# Patient Record
Sex: Female | Born: 1955
Health system: Southern US, Community
[De-identification: ages and names within clinical notes are randomized; demographics above are authoritative.]

## PROBLEM LIST (undated history)

## (undated) DIAGNOSIS — J302 Other seasonal allergic rhinitis: Secondary | ICD-10-CM

## (undated) DIAGNOSIS — T8859XA Other complications of anesthesia, initial encounter: Secondary | ICD-10-CM

## (undated) DIAGNOSIS — I00 Rheumatic fever without heart involvement: Secondary | ICD-10-CM

## (undated) DIAGNOSIS — K635 Polyp of colon: Secondary | ICD-10-CM

## (undated) DIAGNOSIS — N390 Urinary tract infection, site not specified: Secondary | ICD-10-CM

## (undated) DIAGNOSIS — E785 Hyperlipidemia, unspecified: Secondary | ICD-10-CM

## (undated) DIAGNOSIS — C801 Malignant (primary) neoplasm, unspecified: Secondary | ICD-10-CM

## (undated) HISTORY — DX: Hyperlipidemia, unspecified: E78.5

## (undated) HISTORY — DX: Polyp of colon: K63.5

## (undated) HISTORY — DX: Rheumatic fever without heart involvement: I00

## (undated) HISTORY — DX: Urinary tract infection, site not specified: N39.0

## (undated) HISTORY — DX: Other seasonal allergic rhinitis: J30.2

## (undated) HISTORY — DX: Malignant (primary) neoplasm, unspecified: C80.1

## (undated) HISTORY — PX: SALIVARY GLAND SURGERY: SHX768

---

## 1984-02-22 HISTORY — PX: TUBAL LIGATION: SHX77

## 1998-06-18 ENCOUNTER — Other Ambulatory Visit: Admission: RE | Admit: 1998-06-18 | Discharge: 1998-06-18 | Payer: Self-pay | Admitting: Gynecology

## 1999-07-01 ENCOUNTER — Other Ambulatory Visit: Admission: RE | Admit: 1999-07-01 | Discharge: 1999-07-01 | Payer: Self-pay | Admitting: Gynecology

## 2000-07-06 ENCOUNTER — Other Ambulatory Visit: Admission: RE | Admit: 2000-07-06 | Discharge: 2000-07-06 | Payer: Self-pay | Admitting: Gynecology

## 2002-03-07 ENCOUNTER — Other Ambulatory Visit: Admission: RE | Admit: 2002-03-07 | Discharge: 2002-03-07 | Payer: Self-pay | Admitting: Gynecology

## 2003-07-03 ENCOUNTER — Other Ambulatory Visit: Admission: RE | Admit: 2003-07-03 | Discharge: 2003-07-03 | Payer: Self-pay | Admitting: Gynecology

## 2004-08-13 ENCOUNTER — Other Ambulatory Visit: Admission: RE | Admit: 2004-08-13 | Discharge: 2004-08-13 | Payer: Self-pay | Admitting: Gynecology

## 2006-11-24 ENCOUNTER — Encounter: Admission: RE | Admit: 2006-11-24 | Discharge: 2006-11-24 | Payer: Self-pay | Admitting: Family Medicine

## 2006-11-24 IMAGING — CR DG CHEST 2V
2 series · 2 of 2 positions shown · non-contrast
Comparison: none

CLINICAL DATA: Fever, cough, congestion and left sided chest pain. 
 CHEST - 2 VIEW:
 No prior studies.

[view not recorded (1 of 2)]
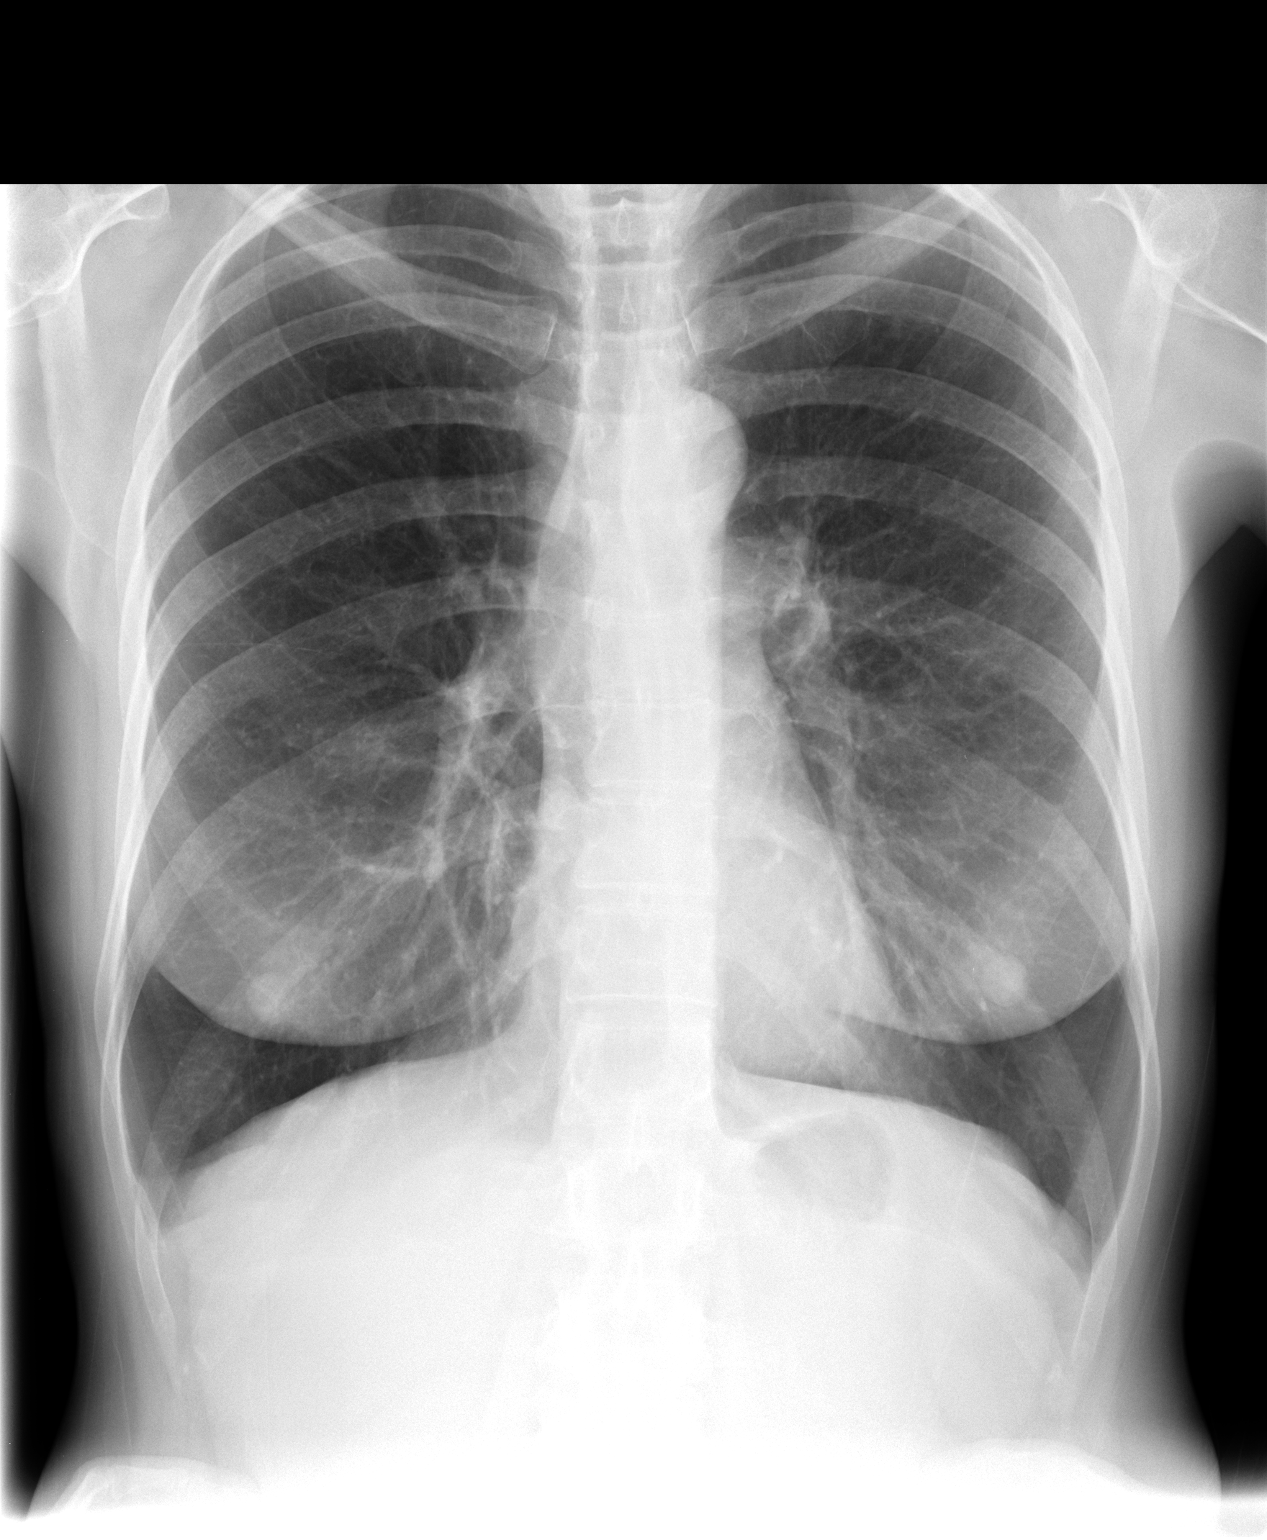

[view not recorded (2 of 2)]
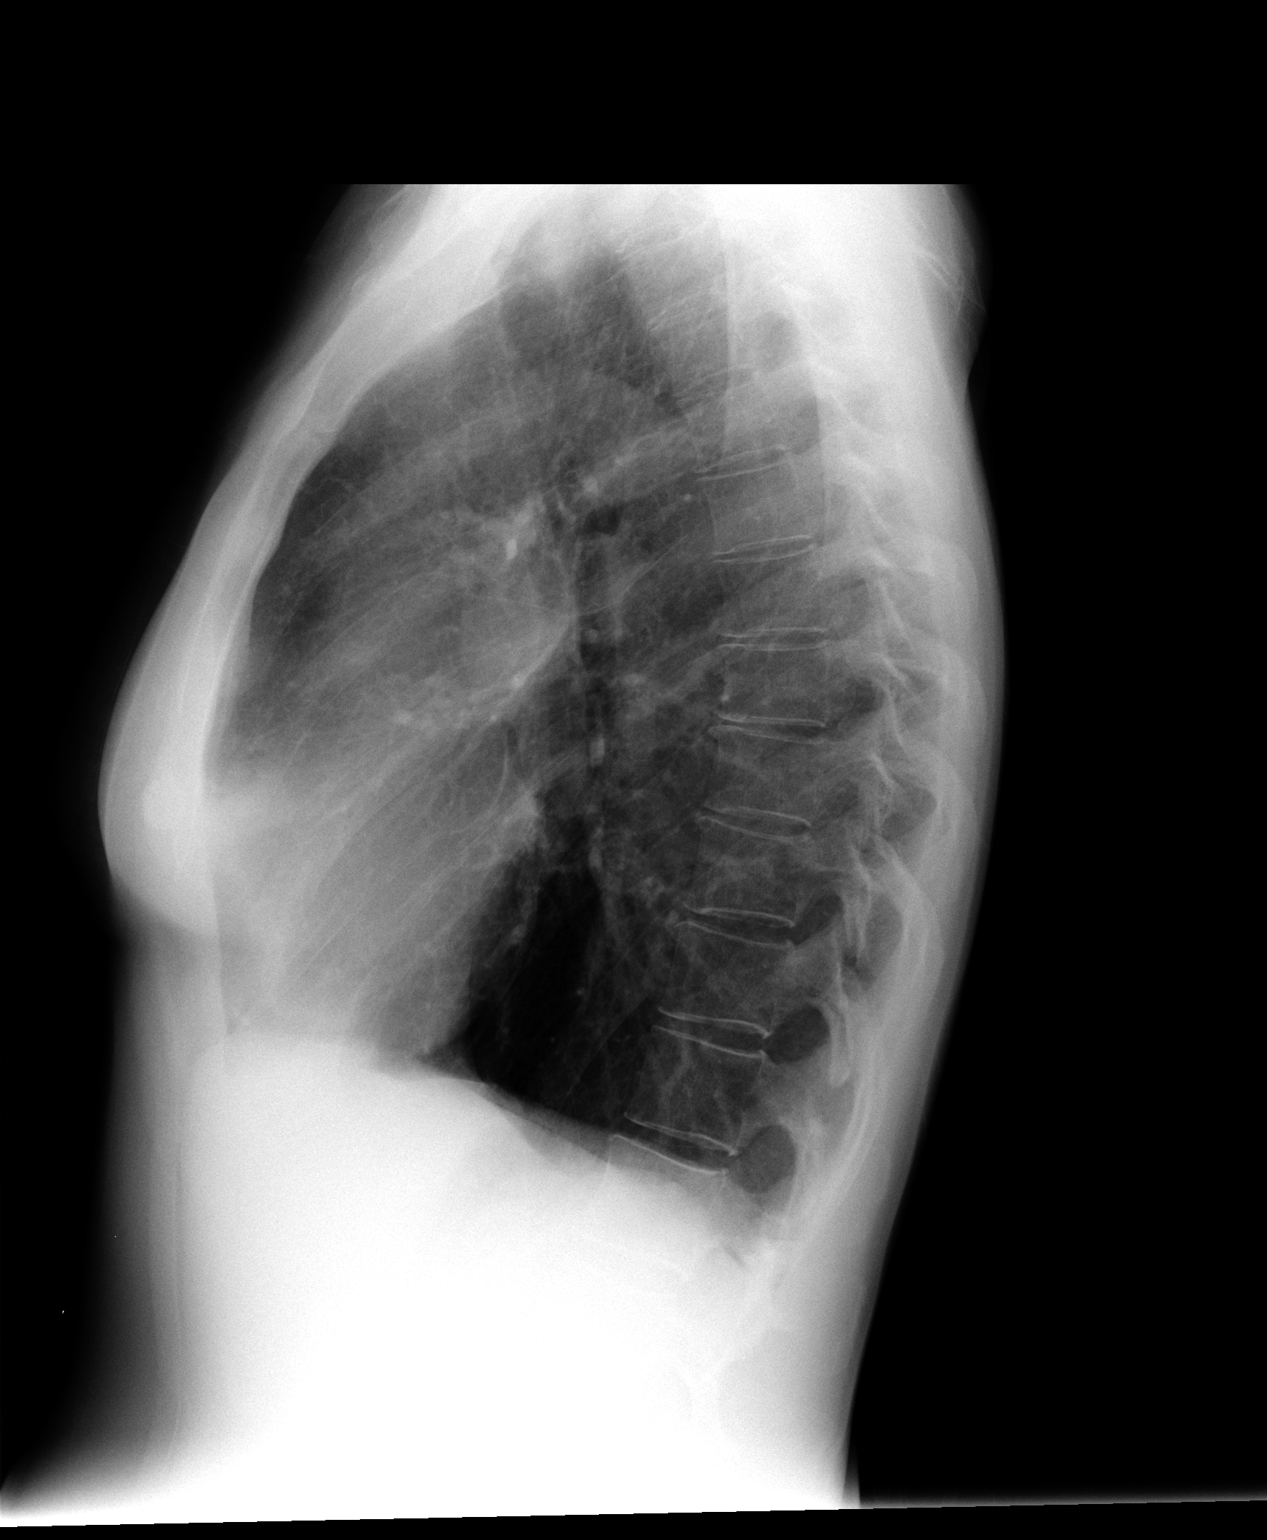

[2 of 2 positions shown; findings below may reference images not displayed]

FINDINGS: No infiltrate, edema or pleural effusion.  Heart size is normal.  Normal bony thorax.
IMPRESSION: No active disease.

## 2009-09-15 ENCOUNTER — Encounter: Admission: RE | Admit: 2009-09-15 | Discharge: 2009-09-15 | Payer: Self-pay | Admitting: Gynecology

## 2010-03-14 ENCOUNTER — Encounter: Payer: Self-pay | Admitting: Gynecology

## 2015-02-19 ENCOUNTER — Telehealth: Payer: Self-pay | Admitting: Acute Care

## 2015-02-19 NOTE — Telephone Encounter (Signed)
Left message for patient x3.  Sent letter to patient.   Dear Mrs. Sue Chase We have attempted to call you several times to schedule the lung screening Dr. London Pepper requested you have performed. We have been unable to contact you by phone. Please call the number below at your earliest convenience so that we can get you scheduled for your screening. We look forward to participating in your care.  Thank you,  [facility] [phone]

## 2016-02-22 HISTORY — PX: COLONOSCOPY: SHX174

## 2017-06-29 ENCOUNTER — Ambulatory Visit (INDEPENDENT_AMBULATORY_CARE_PROVIDER_SITE_OTHER): Payer: BLUE CROSS/BLUE SHIELD | Admitting: Adult Health

## 2017-06-29 ENCOUNTER — Encounter: Payer: Self-pay | Admitting: Adult Health

## 2017-06-29 VITALS — BP 106/70 | Temp 98.0°F | Wt 121.0 lb

## 2017-06-29 DIAGNOSIS — R600 Localized edema: Secondary | ICD-10-CM

## 2017-06-29 DIAGNOSIS — E785 Hyperlipidemia, unspecified: Secondary | ICD-10-CM | POA: Insufficient documentation

## 2017-06-29 DIAGNOSIS — Z720 Tobacco use: Secondary | ICD-10-CM | POA: Diagnosis not present

## 2017-06-29 DIAGNOSIS — H2511 Age-related nuclear cataract, right eye: Secondary | ICD-10-CM | POA: Diagnosis not present

## 2017-06-29 DIAGNOSIS — Z0000 Encounter for general adult medical examination without abnormal findings: Secondary | ICD-10-CM

## 2017-06-29 LAB — CBC WITH DIFFERENTIAL/PLATELET
BASOS ABS: 0 10*3/uL (ref 0.0–0.1)
BASOS PCT: 0.4 % (ref 0.0–3.0)
EOS ABS: 0.4 10*3/uL (ref 0.0–0.7)
Eosinophils Relative: 6.1 % — ABNORMAL HIGH (ref 0.0–5.0)
HEMATOCRIT: 41.9 % (ref 36.0–46.0)
HEMOGLOBIN: 14.3 g/dL (ref 12.0–15.0)
LYMPHS PCT: 46.4 % — AB (ref 12.0–46.0)
Lymphs Abs: 2.9 10*3/uL (ref 0.7–4.0)
MCHC: 34.2 g/dL (ref 30.0–36.0)
MCV: 100 fl (ref 78.0–100.0)
Monocytes Absolute: 0.6 10*3/uL (ref 0.1–1.0)
Monocytes Relative: 9.3 % (ref 3.0–12.0)
Neutro Abs: 2.3 10*3/uL (ref 1.4–7.7)
Neutrophils Relative %: 37.8 % — ABNORMAL LOW (ref 43.0–77.0)
Platelets: 305 10*3/uL (ref 150.0–400.0)
RBC: 4.19 Mil/uL (ref 3.87–5.11)
RDW: 15.1 % (ref 11.5–15.5)
WBC: 6.2 10*3/uL (ref 4.0–10.5)

## 2017-06-29 LAB — POCT URINALYSIS DIPSTICK
BILIRUBIN UA: NEGATIVE
GLUCOSE UA: NEGATIVE
KETONES UA: NEGATIVE
LEUKOCYTES UA: NEGATIVE
Nitrite, UA: NEGATIVE
Odor: NEGATIVE
RBC UA: NEGATIVE
SPEC GRAV UA: 1.025 (ref 1.010–1.025)
Urobilinogen, UA: 0.2 E.U./dL
pH, UA: 6 (ref 5.0–8.0)

## 2017-06-29 LAB — HEMOGLOBIN A1C: Hgb A1c MFr Bld: 5.1 % (ref 4.6–6.5)

## 2017-06-29 LAB — LIPID PANEL
CHOL/HDL RATIO: 4
CHOLESTEROL: 236 mg/dL — AB (ref 0–200)
HDL: 65.1 mg/dL (ref >39.00)
NonHDL: 171.24
Triglycerides: 269 mg/dL — ABNORMAL HIGH (ref 0.0–149.0)
VLDL: 53.8 mg/dL — ABNORMAL HIGH (ref 0.0–40.0)

## 2017-06-29 LAB — COMPREHENSIVE METABOLIC PANEL
ALBUMIN: 4.1 g/dL (ref 3.5–5.2)
ALT: 7 U/L (ref 0–35)
AST: 18 U/L (ref 0–37)
Alkaline Phosphatase: 76 U/L (ref 39–117)
BUN: 6 mg/dL (ref 6–23)
CALCIUM: 9.7 mg/dL (ref 8.4–10.5)
CO2: 33 mEq/L — ABNORMAL HIGH (ref 19–32)
CREATININE: 0.54 mg/dL (ref 0.40–1.20)
Chloride: 102 mEq/L (ref 96–112)
GFR: 121.68 mL/min (ref >60.00)
Glucose, Bld: 69 mg/dL — ABNORMAL LOW (ref 70–99)
Potassium: 3.2 mEq/L — ABNORMAL LOW (ref 3.5–5.1)
SODIUM: 146 meq/L — AB (ref 135–145)
TOTAL PROTEIN: 6.7 g/dL (ref 6.0–8.3)
Total Bilirubin: 0.5 mg/dL (ref 0.2–1.2)

## 2017-06-29 LAB — LDL CHOLESTEROL, DIRECT: Direct LDL: 128 mg/dL

## 2017-06-29 LAB — BRAIN NATRIURETIC PEPTIDE: PRO B NATRI PEPTIDE: 26 pg/mL (ref 0.0–100.0)

## 2017-06-29 LAB — VITAMIN D 25 HYDROXY (VIT D DEFICIENCY, FRACTURES): VITD: 7.56 ng/mL — AB (ref 30.00–100.00)

## 2017-06-29 LAB — TSH: TSH: 2.23 u[IU]/mL (ref 0.35–4.50)

## 2017-06-29 MED ORDER — PRAVASTATIN SODIUM 20 MG PO TABS: 20.0000 mg | ORAL_TABLET | Freq: Every day | ORAL | 3 refills | Status: DC

## 2017-06-29 NOTE — Progress Notes (Signed)
Patient presents to clinic today to establish care. She is a pleasant 62 year old female who  has a past medical history of Colon polyps, Hyperlipidemia, Rheumatic fever, Seasonal allergies, and UTI (urinary tract infection).   She is a former patient of the [employer].  Last physical was December 2017    Acute Concerns: Establish Care /CPE   Chronic Issues: Hyperlipidemia - Takes Pravastatin - needs refill.   Tobacco Use - smokes about a pack a day.  She understands that she needs to quit and has quit in the past, but does not want to quit at this time  Lower extremity edema -has been present for multiple years.  Reports swelling is greater in left than right is worse as the day progresses.  Swelling resolves in the evening when she is lying down.  Denies any pain, redness or warmth.  He does eat a high sodium diet.  He denies any chest pain or shortness of breath with exertion  Health Maintenance: Dental --Routine - Dr. Vivia Ewing  Vision -- Routine  Immunizations --  Unknown.  Colonoscopy -- 2018 - 5 year plan - done by [facility]  Mammogram -- Done at GYN office in the past.  Reports last was probably 10 years ago.  She plans on following up with GYN PAP -- " 10 years ago" -she is going to reestablish with GYN Bone Density -- Had one - unknown date.  Diet: Does not follow a diet. Eats out 3 x a week. Does not eat fast food.  Exercise: Walks multiple times per week   Past Medical History:  Diagnosis Date  . Colon polyps   . Hyperlipidemia   . Rheumatic fever   . Seasonal allergies   . UTI (urinary tract infection)     Past Surgical History:  Procedure Laterality Date  . SALIVARY GLAND SURGERY     LATE 80S  . TUBAL LIGATION  1986    Current Outpatient Medications on File Prior to Visit  Medication Sig Dispense Refill  . loratadine (CLARITIN) 10 MG tablet Take 10 mg by mouth daily.    . pravastatin (PRAVACHOL) 20 MG tablet Take 20 mg by mouth daily.      No current facility-administered medications on file prior to visit.     Allergies  Allergen Reactions  . Codeine Itching    Family History  Problem Relation Age of Onset  . Arthritis Mother   . Diabetes Mother   . Heart disease Mother   . Hyperlipidemia Mother   . Hypertension Mother   . Miscarriages / [place] Mother   . Hearing loss Father   . Liver cancer Father   . Lung cancer Father   . Hyperlipidemia Father   . Diabetes Brother   . Lung disease Maternal Grandfather        Black Lung  . Diabetes Paternal Grandmother   . Cancer Paternal Grandfather   . Diabetes Paternal Grandfather     Social History   Socioeconomic History  . Marital status: Married    Spouse name: Not on file  . Number of children: Not on file  . Years of education: Not on file  . Highest education level: Not on file  Occupational History  . Not on file  Social Needs  . Financial resource strain: Not on file  . Food insecurity:    Worry: Not on file    Inability: Not on file  . Transportation needs:  Medical: Not on file    Non-medical: Not on file  Tobacco Use  . Smoking status: Current Every Day Smoker    Packs/day: 1.00    Years: 40.00    Pack years: 40.00  . Smokeless tobacco: Never Used  Substance and Sexual Activity  . Alcohol use: Yes    Comment: Rum and Coke/Unsure of amount  . Drug use: Never  . Sexual activity: Not on file  Lifestyle  . Physical activity:    Days per week: Not on file    Minutes per session: Not on file  . Stress: Not on file  Relationships  . Social connections:    Talks on phone: Not on file    Gets together: Not on file    Attends religious service: Not on file    Active member of club or organization: Not on file    Attends meetings of clubs or organizations: Not on file    Relationship status: Not on file  . Intimate partner violence:    Fear of current or ex partner: Not on file    Emotionally abused: Not on file    Physically  abused: Not on file    Forced sexual activity: Not on file  Other Topics Concern  . Not on file  Social History Narrative   Married    Two grown children    She enjoys reading, walking    Review of Systems  Constitutional: Negative.   HENT: Negative.   Eyes: Negative.   Respiratory: Negative.   Cardiovascular: Positive for leg swelling.  Gastrointestinal: Negative.   Genitourinary: Negative.   Musculoskeletal: Negative.   Skin: Negative.   Neurological: Negative.   Endo/Heme/Allergies: Negative.   Psychiatric/Behavioral: Negative.   All other systems reviewed and are negative.   BP 106/70 (BP Location: Left Arm)   Temp 98 F (36.7 C) (Oral)   Wt 121 lb (54.9 kg)   Physical Exam  Constitutional: She is oriented to person, place, and time. She appears well-developed and well-nourished. No distress.  HENT:  Head: Normocephalic and atraumatic.  Right Ear: External ear normal.  Left Ear: External ear normal.  Nose: Nose normal.  Mouth/Throat: Oropharynx is clear and moist. No oropharyngeal exudate.  Eyes: Pupils are equal, round, and reactive to light. Conjunctivae and EOM are normal. Right eye exhibits no discharge. Left eye exhibits no discharge. No scleral icterus.  Neck: Normal range of motion. Neck supple. No JVD present. No tracheal deviation present. No thyromegaly present.  Cardiovascular: Normal rate, regular rhythm, normal heart sounds and intact distal pulses. Exam reveals no gallop and no friction rub.  No murmur heard. Pulmonary/Chest: Effort normal and breath sounds normal. No stridor. No respiratory distress. She has no wheezes. She has no rales. She exhibits no tenderness.  Abdominal: Soft. Bowel sounds are normal. She exhibits no distension and no mass. There is no tenderness. There is no rebound and no guarding. No hernia.  Genitourinary:  Genitourinary Comments: Refused breast exam.  She will establish care with GYN  Musculoskeletal: Normal range of  motion. She exhibits edema (1 pitting edema bilateral legs.  Left greater than right). She exhibits no tenderness or deformity.  Lymphadenopathy:    She has no cervical adenopathy.  Neurological: She is alert and oriented to person, place, and time. She displays normal reflexes. No cranial nerve deficit or sensory deficit. She exhibits normal muscle tone. Coordination normal.  Skin: Skin is warm and dry. Capillary refill takes less than 2 seconds. No  rash noted. She is not diaphoretic. No erythema. No pallor.  Psychiatric: She has a normal mood and affect. Her behavior is normal. Judgment and thought content normal.  Nursing note and vitals reviewed.  Assessment/Plan: 1. Routine general medical examination at a health care facility -Needs to quit smoking -DASH diet recommendation -Continue walking  -We will request records from [place] family physicians - CBC with Differential/Platelet - Hemoglobin A1c - Comprehensive metabolic panel - Lipid panel - TSH - Vitamin D, 25-hydroxy - POC Urinalysis Dipstick  2. Lower extremity edema -Likely due to diet.  Due to smoking history and edema greater on left lower extremity than right.  Would like to rule out degree of heart failure.  Will get echocardiogram and consider referral to cardiology if needed.  -Encouraged low-sodium diet and to drink plenty of water throughout the day - ECHOCARDIOGRAM COMPLETE; Future - CBC with Differential/Platelet - Comprehensive metabolic panel - TSH - Brain Natriuretic Peptide  3. Hyperlipidemia, unspecified hyperlipidemia type - Encouraged heart healthy diet - pravastatin (PRAVACHOL) 20 MG tablet; Take 20 mg by mouth daily. - CBC with Differential/Platelet - Hemoglobin A1c - Comprehensive metabolic panel - Lipid panel - TSH  4. Tobacco use - Encouraged to quit smoking   [name], NP

## 2017-06-29 NOTE — Patient Instructions (Addendum)
It was great meeting you today   I will follow up with you regarding your blood work   Someone will contact you to schedule your echocardiogram   Please work on low sodium diet    DASH Eating Plan DASH stands for "Dietary Approaches to Stop Hypertension." The DASH eating plan is a healthy eating plan that has been shown to reduce high blood pressure (hypertension). It may also reduce your risk for type 2 diabetes, heart disease, and stroke. The DASH eating plan may also help with weight loss. What are tips for following this plan? General guidelines  Avoid eating more than 2,300 mg (milligrams) of salt (sodium) a day. If you have hypertension, you may need to reduce your sodium intake to 1,500 mg a day.  Limit alcohol intake to no more than 1 drink a day for nonpregnant women and 2 drinks a day for men. One drink equals 12 oz of beer, 5 oz of wine, or 1 oz of hard liquor.  Work with your health care provider to maintain a healthy body weight or to lose weight. Ask what an ideal weight is for you.  Get at least 30 minutes of exercise that causes your heart to beat faster (aerobic exercise) most days of the week. Activities may include walking, swimming, or biking.  Work with your health care provider or diet and nutrition specialist (dietitian) to adjust your eating plan to your individual calorie needs. Reading food labels  Check food labels for the amount of sodium per serving. Choose foods with less than 5 percent of the Daily Value of sodium. Generally, foods with less than 300 mg of sodium per serving fit into this eating plan.  To find whole grains, look for the word "whole" as the first word in the ingredient list. Shopping  Buy products labeled as "low-sodium" or "no salt added."  Buy fresh foods. Avoid canned foods and premade or frozen meals. Cooking  Avoid adding salt when cooking. Use salt-free seasonings or herbs instead of table salt or sea salt. Check with your  health care provider or pharmacist before using salt substitutes.  Do not fry foods. Cook foods using healthy methods such as baking, boiling, grilling, and broiling instead.  Cook with heart-healthy oils, such as olive, canola, soybean, or sunflower oil. Meal planning   Eat a balanced diet that includes: ? 5 or more servings of fruits and vegetables each day. At each meal, try to fill half of your plate with fruits and vegetables. ? Up to 6-8 servings of whole grains each day. ? Less than 6 oz of lean meat, poultry, or fish each day. A 3-oz serving of meat is about the same size as a deck of cards. One egg equals 1 oz. ? 2 servings of low-fat dairy each day. ? A serving of nuts, seeds, or beans 5 times each week. ? Heart-healthy fats. Healthy fats called Omega-3 fatty acids are found in foods such as flaxseeds and coldwater fish, like sardines, salmon, and mackerel.  Limit how much you eat of the following: ? Canned or prepackaged foods. ? Food that is high in trans fat, such as fried foods. ? Food that is high in saturated fat, such as fatty meat. ? Sweets, desserts, sugary drinks, and other foods with added sugar. ? Full-fat dairy products.  Do not salt foods before eating.  Try to eat at least 2 vegetarian meals each week.  Eat more home-cooked food and less restaurant, buffet, and fast food.  When eating at a restaurant, ask that your food be prepared with less salt or no salt, if possible. What foods are recommended? The items listed may not be a complete list. Talk with your dietitian about what dietary choices are best for you. Grains Whole-grain or whole-wheat bread. Whole-grain or whole-wheat pasta. Brown rice. [place]. Bulgur. Whole-grain and low-sodium cereals. Pita bread. Low-fat, low-sodium crackers. Whole-wheat flour tortillas. Vegetables Fresh or frozen vegetables (raw, steamed, roasted, or grilled). Low-sodium or reduced-sodium tomato and vegetable juice.  Low-sodium or reduced-sodium tomato sauce and tomato paste. Low-sodium or reduced-sodium canned vegetables. Fruits All fresh, dried, or frozen fruit. Canned fruit in natural juice (without added sugar). Meat and other protein foods Skinless chicken or [place]. Ground chicken or [place]. Pork with fat trimmed off. Fish and seafood. Egg whites. Dried beans, peas, or lentils. Unsalted nuts, nut butters, and seeds. Unsalted canned beans. Lean cuts of beef with fat trimmed off. Low-sodium, lean deli meat. Dairy Low-fat (1%) or fat-free (skim) milk. Fat-free, low-fat, or reduced-fat cheeses. Nonfat, low-sodium ricotta or cottage cheese. Low-fat or nonfat yogurt. Low-fat, low-sodium cheese. Fats and oils Soft margarine without trans fats. Vegetable oil. Low-fat, reduced-fat, or light mayonnaise and salad dressings (reduced-sodium). Canola, safflower, olive, soybean, and sunflower oils. Avocado. Seasoning and other foods Herbs. Spices. Seasoning mixes without salt. Unsalted popcorn and pretzels. Fat-free sweets. What foods are not recommended? The items listed may not be a complete list. Talk with your dietitian about what dietary choices are best for you. Grains Baked goods made with fat, such as croissants, muffins, or some breads. Dry pasta or rice meal packs. Vegetables Creamed or fried vegetables. Vegetables in a cheese sauce. Regular canned vegetables (not low-sodium or reduced-sodium). Regular canned tomato sauce and paste (not low-sodium or reduced-sodium). Regular tomato and vegetable juice (not low-sodium or reduced-sodium). Angie Fava. Olives. Fruits Canned fruit in a light or heavy syrup. Fried fruit. Fruit in cream or butter sauce. Meat and other protein foods Fatty cuts of meat. Ribs. Fried meat. Berniece Salines. Sausage. Bologna and other processed lunch meats. Salami. Fatback. Hotdogs. Bratwurst. Salted nuts and seeds. Canned beans with added salt. Canned or smoked fish. Whole eggs or egg yolks. Chicken  or [place] with skin. Dairy Whole or 2% milk, cream, and half-and-half. Whole or full-fat cream cheese. Whole-fat or sweetened yogurt. Full-fat cheese. Nondairy creamers. Whipped toppings. Processed cheese and cheese spreads. Fats and oils Butter. Stick margarine. Lard. Shortening. Ghee. Bacon fat. Tropical oils, such as coconut, palm kernel, or palm oil. Seasoning and other foods Salted popcorn and pretzels. Onion salt, garlic salt, seasoned salt, table salt, and sea salt. Worcestershire sauce. Tartar sauce. Barbecue sauce. Teriyaki sauce. Soy sauce, including reduced-sodium. Steak sauce. Canned and packaged gravies. Fish sauce. Oyster sauce. Cocktail sauce. Horseradish that you find on the shelf. Ketchup. Mustard. Meat flavorings and tenderizers. Bouillon cubes. Hot sauce and Tabasco sauce. Premade or packaged marinades. Premade or packaged taco seasonings. Relishes. Regular salad dressings. Where to find more information:  National Heart, Lung, and [place]: https://wilson-eaton.com/  American Heart Association: www.heart.org Summary  The DASH eating plan is a healthy eating plan that has been shown to reduce high blood pressure (hypertension). It may also reduce your risk for type 2 diabetes, heart disease, and stroke.  With the DASH eating plan, you should limit salt (sodium) intake to 2,300 mg a day. If you have hypertension, you may need to reduce your sodium intake to 1,500 mg a day.  When on the DASH eating plan,  aim to eat more fresh fruits and vegetables, whole grains, lean proteins, low-fat dairy, and heart-healthy fats.  Work with your health care provider or diet and nutrition specialist (dietitian) to adjust your eating plan to your individual calorie needs. This information is not intended to replace advice given to you by your health care provider. Make sure you discuss any questions you have with your health care provider. Document Released: 01/27/2011 Document Revised:  02/01/2016 Document Reviewed: 02/01/2016 Elsevier Interactive Patient Education  [employer].

## 2017-06-30 ENCOUNTER — Other Ambulatory Visit: Payer: Self-pay | Admitting: Family Medicine

## 2017-06-30 MED ORDER — VITAMIN D (ERGOCALCIFEROL) 1.25 MG (50000 UNIT) PO CAPS: 50000.0000 [IU] | ORAL_CAPSULE | ORAL | 0 refills | Status: DC

## 2017-06-30 NOTE — Telephone Encounter (Signed)
Sent to the pharmacy by e-scribe. 

## 2017-07-10 DIAGNOSIS — Z01818 Encounter for other preprocedural examination: Secondary | ICD-10-CM | POA: Diagnosis not present

## 2017-07-10 DIAGNOSIS — H25811 Combined forms of age-related cataract, right eye: Secondary | ICD-10-CM | POA: Diagnosis not present

## 2017-07-11 DIAGNOSIS — H2511 Age-related nuclear cataract, right eye: Secondary | ICD-10-CM | POA: Diagnosis not present

## 2017-07-11 DIAGNOSIS — H25811 Combined forms of age-related cataract, right eye: Secondary | ICD-10-CM | POA: Diagnosis not present

## 2017-07-12 ENCOUNTER — Encounter: Payer: Self-pay | Admitting: Cardiology

## 2017-07-21 DIAGNOSIS — H25811 Combined forms of age-related cataract, right eye: Secondary | ICD-10-CM | POA: Diagnosis not present

## 2017-07-21 DIAGNOSIS — H25812 Combined forms of age-related cataract, left eye: Secondary | ICD-10-CM | POA: Diagnosis not present

## 2017-07-21 DIAGNOSIS — H2512 Age-related nuclear cataract, left eye: Secondary | ICD-10-CM | POA: Diagnosis not present

## 2017-07-26 ENCOUNTER — Ambulatory Visit (HOSPITAL_COMMUNITY): Payer: BLUE CROSS/BLUE SHIELD | Attending: Cardiovascular Disease

## 2017-07-26 ENCOUNTER — Other Ambulatory Visit: Payer: Self-pay

## 2017-07-26 DIAGNOSIS — E785 Hyperlipidemia, unspecified: Secondary | ICD-10-CM | POA: Insufficient documentation

## 2017-07-26 DIAGNOSIS — I071 Rheumatic tricuspid insufficiency: Secondary | ICD-10-CM | POA: Diagnosis not present

## 2017-07-26 DIAGNOSIS — R600 Localized edema: Secondary | ICD-10-CM | POA: Insufficient documentation

## 2017-07-26 DIAGNOSIS — Z720 Tobacco use: Secondary | ICD-10-CM | POA: Insufficient documentation

## 2017-07-27 ENCOUNTER — Encounter: Payer: Self-pay | Admitting: Family Medicine

## 2017-09-22 ENCOUNTER — Other Ambulatory Visit: Payer: BLUE CROSS/BLUE SHIELD

## 2017-11-20 ENCOUNTER — Telehealth: Payer: Self-pay | Admitting: Adult Health

## 2017-11-20 ENCOUNTER — Encounter: Payer: Self-pay | Admitting: Adult Health

## 2017-11-20 ENCOUNTER — Ambulatory Visit (INDEPENDENT_AMBULATORY_CARE_PROVIDER_SITE_OTHER): Payer: BLUE CROSS/BLUE SHIELD | Admitting: Adult Health

## 2017-11-20 VITALS — BP 110/80 | HR 82 | Temp 97.7°F | Wt 116.0 lb

## 2017-11-20 DIAGNOSIS — R197 Diarrhea, unspecified: Secondary | ICD-10-CM

## 2017-11-20 DIAGNOSIS — R1084 Generalized abdominal pain: Secondary | ICD-10-CM | POA: Diagnosis not present

## 2017-11-20 LAB — BASIC METABOLIC PANEL
BUN: 5 mg/dL — ABNORMAL LOW (ref 6–23)
CO2: 32 mEq/L (ref 19–32)
CREATININE: 0.6 mg/dL (ref 0.40–1.20)
Calcium: 9.6 mg/dL (ref 8.4–10.5)
Chloride: 97 mEq/L (ref 96–112)
GFR: 107.61 mL/min (ref >60.00)
Glucose, Bld: 99 mg/dL (ref 70–99)
POTASSIUM: 3.4 meq/L — AB (ref 3.5–5.1)
Sodium: 139 mEq/L (ref 135–145)

## 2017-11-20 LAB — CBC WITH DIFFERENTIAL/PLATELET
Basophils Absolute: 0 10*3/uL (ref 0.0–0.1)
Basophils Relative: 0.5 % (ref 0.0–3.0)
EOS ABS: 0.3 10*3/uL (ref 0.0–0.7)
Eosinophils Relative: 3.9 % (ref 0.0–5.0)
HCT: 41.9 % (ref 36.0–46.0)
Hemoglobin: 14.6 g/dL (ref 12.0–15.0)
LYMPHS ABS: 2.3 10*3/uL (ref 0.7–4.0)
Lymphocytes Relative: 29.2 % (ref 12.0–46.0)
MCHC: 34.8 g/dL (ref 30.0–36.0)
MCV: 99.4 fl (ref 78.0–100.0)
Monocytes Absolute: 0.9 10*3/uL (ref 0.1–1.0)
Monocytes Relative: 12.2 % — ABNORMAL HIGH (ref 3.0–12.0)
NEUTROS ABS: 4.2 10*3/uL (ref 1.4–7.7)
NEUTROS PCT: 54.2 % (ref 43.0–77.0)
PLATELETS: 345 10*3/uL (ref 150.0–400.0)
RBC: 4.21 Mil/uL (ref 3.87–5.11)
RDW: 15.4 % (ref 11.5–15.5)
WBC: 7.8 10*3/uL (ref 4.0–10.5)

## 2017-11-20 MED ORDER — OMEPRAZOLE 20 MG PO CPDR: 20.0000 mg | DELAYED_RELEASE_CAPSULE | Freq: Every day | ORAL | 3 refills | Status: DC

## 2017-11-20 NOTE — Progress Notes (Signed)
Subjective:    Patient ID: Sue Chase, female    DOB: [birth year], 62 y.o.   MRN: [id number]  HPI 62 year old female who  has a past medical history of Colon polyps, Hyperlipidemia, Rheumatic fever, Seasonal allergies, and UTI (urinary tract infection).  She presents to the office today for an acute issue of abdominal pain and diarrhea. She reports that over the last month she has had chronic diarrhea, denies any blood in her stool, pain with bowel movements, nausea or vomiting. Does not matter if she eats any food. Also with generalized abdominal pain that is intermittent. Described the pain has a burning pain for the first week or so and then just " pain". Has not noticed increased pain with eating.   Has not used anything over the counter to help relieve her symptoms.   She has not had any questionable foods, travel outside of the country, fevers, or feeling acutely ill.   Review of Systems See HPI   Past Medical History:  Diagnosis Date  . Colon polyps   . Hyperlipidemia   . Rheumatic fever   . Seasonal allergies   . UTI (urinary tract infection)     Social History   Socioeconomic History  . Marital status: Married    Spouse name: Not on file  . Number of children: Not on file  . Years of education: Not on file  . Highest education level: Not on file  Occupational History  . Not on file  Social Needs  . Financial resource strain: Not on file  . Food insecurity:    Worry: Not on file    Inability: Not on file  . Transportation needs:    Medical: Not on file    Non-medical: Not on file  Tobacco Use  . Smoking status: Current Every Day Smoker    Packs/day: 1.00    Years: 40.00    Pack years: 40.00  . Smokeless tobacco: Never Used  Substance and Sexual Activity  . Alcohol use: Yes    Comment: Rum and Coke/Unsure of amount  . Drug use: Never  . Sexual activity: Not on file  Lifestyle  . Physical activity:    Days per week: Not on file    Minutes per  session: Not on file  . Stress: Not on file  Relationships  . Social connections:    Talks on phone: Not on file    Gets together: Not on file    Attends religious service: Not on file    Active member of club or organization: Not on file    Attends meetings of clubs or organizations: Not on file    Relationship status: Not on file  . Intimate partner violence:    Fear of current or ex partner: Not on file    Emotionally abused: Not on file    Physically abused: Not on file    Forced sexual activity: Not on file  Other Topics Concern  . Not on file  Social History Narrative   Married    Two grown children    She enjoys reading, walking    Past Surgical History:  Procedure Laterality Date  . SALIVARY GLAND SURGERY     LATE 80S  . TUBAL LIGATION  1986    Family History  Problem Relation Age of Onset  . Arthritis Mother   . Diabetes Mother   . Heart disease Mother   . Hyperlipidemia Mother   . Hypertension Mother   .  Miscarriages / [place] Mother   . Hearing loss Father   . Liver cancer Father   . Lung cancer Father   . Hyperlipidemia Father   . Diabetes Brother   . Lung disease Maternal Grandfather        Black Lung  . Diabetes Paternal Grandmother   . Cancer Paternal Grandfather   . Diabetes Paternal Grandfather     Allergies  Allergen Reactions  . Codeine Itching    Current Outpatient Medications on File Prior to Visit  Medication Sig Dispense Refill  . loratadine (CLARITIN) 10 MG tablet Take 10 mg by mouth daily.    . pravastatin (PRAVACHOL) 20 MG tablet Take 1 tablet (20 mg total) by mouth daily. 90 tablet 3   No current facility-administered medications on file prior to visit.     BP 110/80 (BP Location: Left Arm, Patient Position: Sitting, Cuff Size: Normal)   Pulse 82   Temp 97.7 F (36.5 C) (Oral)   Wt 116 lb (52.6 kg)   SpO2 98%       Objective:   Physical Exam  Constitutional: She is oriented to person, place, and time. She appears  well-developed and well-nourished. No distress.  Cardiovascular: Normal rate, regular rhythm, normal heart sounds and intact distal pulses.  Pulmonary/Chest: Effort normal and breath sounds normal.  Abdominal: Soft. Bowel sounds are normal. She exhibits no distension and no mass. There is no tenderness. There is no rebound and no guarding. No hernia.  Neurological: She is alert and oriented to person, place, and time.  Skin: Skin is warm and dry. She is not diaphoretic.  Psychiatric: She has a normal mood and affect. Her behavior is normal. Judgment and thought content normal.  Nursing note and vitals reviewed.     Assessment & Plan:  1. Diarrhea, unspecified type - Possibly related to GERD or PUD. Will check basic labs and get stool culture due to time frame.  - Start on Prilosec.  - Can take 1/2 tab imodium until form bowel movement - then stop  - Stay hydrated and eat a bland diet  - CBC with Differential/Platelet - Basic Metabolic Panel - Stool culture - Follow up if not resolving in the next week  - Avoid NSAIDS  2. Generalized abdominal pain  - CBC with Differential/Platelet - Basic Metabolic Panel - Stool culture - omeprazole (PRILOSEC) 20 MG capsule; Take 1 capsule (20 mg total) by mouth daily.  Dispense: 30 capsule; Refill: 3  [name], NP

## 2017-11-20 NOTE — Telephone Encounter (Signed)
Copied from [place] [id number]. Topic: Quick Communication - See Telephone Encounter >> Nov 20, 2017  4:38 PM Vernona Rieger wrote: CRM for notification. See Telephone encounter for: 11/20/17.  Patient states her insurance does not cover omeprazole (PRILOSEC) 20 MG capsule and would like to know is there a generic or does the office have samples? What about over the counter stuff, she said?

## 2017-11-20 NOTE — Patient Instructions (Signed)
I am going to prescribe Prilosec to take daily for one month   Take 1/2 tab imodium twice a day until you have a formed stool and then stop   Follow up if not resolving in the next week

## 2017-11-21 ENCOUNTER — Other Ambulatory Visit: Payer: Self-pay | Admitting: Adult Health

## 2017-11-21 NOTE — Telephone Encounter (Signed)
Pt called back. °

## 2017-11-21 NOTE — Telephone Encounter (Signed)
Left a message for a return call.

## 2017-11-21 NOTE — Telephone Encounter (Signed)
Have her buy a month of OTC generic omeprazole 20 mg it is the same medication

## 2017-11-21 NOTE — Telephone Encounter (Signed)
Discussed notes per [name] with Pt. Pt expressed understanding.

## 2017-11-24 LAB — STOOL CULTURE
MICRO NUMBER: 91171281
MICRO NUMBER: 91171282
MICRO NUMBER:: 91171283
SHIGA RESULT:: NOT DETECTED
SPECIMEN QUALITY: ADEQUATE
SPECIMEN QUALITY: ADEQUATE
SPECIMEN QUALITY:: ADEQUATE

## 2018-06-22 ENCOUNTER — Telehealth: Payer: Self-pay | Admitting: Adult Health

## 2018-06-22 NOTE — Telephone Encounter (Signed)
Error

## 2018-07-03 ENCOUNTER — Encounter: Payer: BLUE CROSS/BLUE SHIELD | Admitting: Adult Health

## 2018-07-20 ENCOUNTER — Other Ambulatory Visit: Payer: Self-pay | Admitting: Adult Health

## 2018-07-20 DIAGNOSIS — E785 Hyperlipidemia, unspecified: Secondary | ICD-10-CM

## 2018-07-20 NOTE — Telephone Encounter (Signed)
Sent to the pharmacy by e-scribe. 

## 2018-09-25 ENCOUNTER — Encounter: Payer: BLUE CROSS/BLUE SHIELD | Admitting: Adult Health

## 2018-09-25 DIAGNOSIS — Z0289 Encounter for other administrative examinations: Secondary | ICD-10-CM

## 2018-09-25 NOTE — Progress Notes (Deleted)
Subjective:    Patient ID: Sue Chase, female    DOB: 21-Sep-1955, 63 y.o.   MRN: [id number]  HPI Patient presents for yearly preventative medicine examination. She is a pleasant 63 year old who  has a past medical history of Colon polyps, Hyperlipidemia, Rheumatic fever, Seasonal allergies, and UTI (urinary tract infection).  Hyperlipidemia - takes pravastatin daily. Denies myalgias or fatigue  Lab Results  Component Value Date   CHOL 236 (H) 06/29/2017   HDL 65.10 06/29/2017   LDLDIRECT 128.0 06/29/2017   TRIG 269.0 (H) 06/29/2017   CHOLHDL 4 06/29/2017   Tobacco Use - Continues to smoke about a pack per day. She does not want to quit at this time   All immunizations and health maintenance protocols were reviewed with the patient and needed orders were placed.  Appropriate screening laboratory values were ordered for the patient including screening of hyperlipidemia, renal function and hepatic function. If indicated by BPH, a PSA was ordered.  Medication reconciliation,  past medical history, social history, problem list and allergies were reviewed in detail with the patient  Goals were established with regard to weight loss, exercise, and  diet in compliance with medications  End of life planning was discussed.  She is up to date on routine dental and vision screens. She is next due for a colonoscopy in 2023 ( at [place]).    Review of Systems  Constitutional: Negative.   HENT: Negative.   Eyes: Negative.   Respiratory: Negative.   Cardiovascular: Negative.   Gastrointestinal: Negative.   Endocrine: Negative.   Genitourinary: Negative.   Musculoskeletal: Negative.   Skin: Negative.   Allergic/Immunologic: Negative.   Neurological: Negative.   Hematological: Negative.   Psychiatric/Behavioral: Negative.    Past Medical History:  Diagnosis Date  . Colon polyps   . Hyperlipidemia   . Rheumatic fever   . Seasonal allergies   . UTI (urinary tract infection)      Social History   Socioeconomic History  . Marital status: Married    Spouse name: Not on file  . Number of children: Not on file  . Years of education: Not on file  . Highest education level: Not on file  Occupational History  . Not on file  Social Needs  . Financial resource strain: Not on file  . Food insecurity    Worry: Not on file    Inability: Not on file  . Transportation needs    Medical: Not on file    Non-medical: Not on file  Tobacco Use  . Smoking status: Current Every Day Smoker    Packs/day: 1.00    Years: 40.00    Pack years: 40.00  . Smokeless tobacco: Never Used  Substance and Sexual Activity  . Alcohol use: Yes    Comment: Rum and Coke/Unsure of amount  . Drug use: Never  . Sexual activity: Not on file  Lifestyle  . Physical activity    Days per week: Not on file    Minutes per session: Not on file  . Stress: Not on file  Relationships  . Social Herbalist on phone: Not on file    Gets together: Not on file    Attends religious service: Not on file    Active member of club or organization: Not on file    Attends meetings of clubs or organizations: Not on file    Relationship status: Not on file  . Intimate partner violence  Fear of current or ex partner: Not on file    Emotionally abused: Not on file    Physically abused: Not on file    Forced sexual activity: Not on file  Other Topics Concern  . Not on file  Social History Narrative   Married    Two grown children    She enjoys reading, walking    Past Surgical History:  Procedure Laterality Date  . SALIVARY GLAND SURGERY     LATE 80S  . TUBAL LIGATION  1986    Family History  Problem Relation Age of Onset  . Arthritis Mother   . Diabetes Mother   . Heart disease Mother   . Hyperlipidemia Mother   . Hypertension Mother   . Miscarriages / [place] Mother   . Hearing loss Father   . Liver cancer Father   . Lung cancer Father   . Hyperlipidemia Father   .  Diabetes Brother   . Lung disease Maternal Grandfather        Black Lung  . Diabetes Paternal Grandmother   . Cancer Paternal Grandfather   . Diabetes Paternal Grandfather     Allergies  Allergen Reactions  . Codeine Itching    Current Outpatient Medications on File Prior to Visit  Medication Sig Dispense Refill  . loratadine (CLARITIN) 10 MG tablet Take 10 mg by mouth daily.    Marland Kitchen omeprazole (PRILOSEC) 20 MG capsule Take 1 capsule (20 mg total) by mouth daily. 30 capsule 3  . pravastatin (PRAVACHOL) 20 MG tablet TAKE ONE TABLET BY MOUTH DAILY 90 tablet 0   No current facility-administered medications on file prior to visit.     There were no vitals taken for this visit.      Objective:   Physical Exam Vitals signs and nursing note reviewed.  Constitutional:      General: She is not in acute distress.    Appearance: Normal appearance. She is not diaphoretic.  HENT:     Head: Normocephalic and atraumatic.     Right Ear: Tympanic membrane, ear canal and external ear normal. There is no impacted cerumen.     Left Ear: Tympanic membrane, ear canal and external ear normal. There is no impacted cerumen.     Nose: Nose normal.     Mouth/Throat:     Mouth: Mucous membranes are moist.     Pharynx: Oropharynx is clear. No oropharyngeal exudate or posterior oropharyngeal erythema.  Eyes:     General: No scleral icterus.       Right eye: No discharge.        Left eye: No discharge.     Conjunctiva/sclera: Conjunctivae normal.     Pupils: Pupils are equal, round, and reactive to light.  Neck:     Musculoskeletal: Normal range of motion and neck supple.     Thyroid: No thyromegaly.     Vascular: No JVD.     Trachea: No tracheal deviation.  Cardiovascular:     Rate and Rhythm: Normal rate and regular rhythm.     Pulses: Normal pulses.     Heart sounds: Normal heart sounds. No murmur. No friction rub. No gallop.   Pulmonary:     Effort: Pulmonary effort is normal. No  respiratory distress.     Breath sounds: Normal breath sounds. No stridor. No wheezing, rhonchi or rales.  Chest:     Chest wall: No tenderness.  Abdominal:     General: Bowel sounds are normal. There is no  distension.     Palpations: Abdomen is soft. There is no mass.     Tenderness: There is no abdominal tenderness. There is no right CVA tenderness, left CVA tenderness, guarding or rebound.     Hernia: No hernia is present.  Musculoskeletal: Normal range of motion.        General: No swelling, tenderness or deformity.     Right lower leg: Edema present.     Left lower leg: Edema present.  Lymphadenopathy:     Cervical: No cervical adenopathy.  Skin:    General: Skin is warm and dry.     Capillary Refill: Capillary refill takes less than 2 seconds.     Coloration: Skin is not jaundiced or pale.     Findings: No bruising, erythema, lesion or rash.  Neurological:     General: No focal deficit present.     Mental Status: She is alert and oriented to person, place, and time.     Cranial Nerves: No cranial nerve deficit.     Sensory: No sensory deficit.     Motor: No weakness or abnormal muscle tone.     Coordination: Coordination normal.     Gait: Gait normal.     Deep Tendon Reflexes: Reflexes normal.  Psychiatric:        Mood and Affect: Mood normal.        Behavior: Behavior normal.        Thought Content: Thought content normal.        Judgment: Judgment normal.       Assessment & Plan:

## 2018-10-26 ENCOUNTER — Other Ambulatory Visit: Payer: Self-pay | Admitting: Adult Health

## 2018-10-26 DIAGNOSIS — E785 Hyperlipidemia, unspecified: Secondary | ICD-10-CM

## 2018-10-30 ENCOUNTER — Encounter: Payer: Self-pay | Admitting: Family Medicine

## 2018-10-30 NOTE — Telephone Encounter (Signed)
Filled for 30 days.  Letter sent by mail.

## 2018-11-26 ENCOUNTER — Other Ambulatory Visit: Payer: Self-pay | Admitting: Adult Health

## 2018-11-26 DIAGNOSIS — E785 Hyperlipidemia, unspecified: Secondary | ICD-10-CM

## 2018-11-26 NOTE — Telephone Encounter (Signed)
Pt called checking status on refill

## 2018-11-27 NOTE — Telephone Encounter (Signed)
Sent to the pharmacy by e-scribe for 90 days.  Pt has upcoming cpx on 01/04/2019.

## 2019-01-04 ENCOUNTER — Encounter: Payer: Self-pay | Admitting: Adult Health

## 2019-01-04 ENCOUNTER — Ambulatory Visit (INDEPENDENT_AMBULATORY_CARE_PROVIDER_SITE_OTHER): Payer: BC Managed Care – PPO

## 2019-01-04 ENCOUNTER — Other Ambulatory Visit: Payer: Self-pay | Admitting: Adult Health

## 2019-01-04 ENCOUNTER — Ambulatory Visit (INDEPENDENT_AMBULATORY_CARE_PROVIDER_SITE_OTHER): Payer: BC Managed Care – PPO | Admitting: Adult Health

## 2019-01-04 ENCOUNTER — Other Ambulatory Visit: Payer: Self-pay

## 2019-01-04 VITALS — BP 110/70 | Temp 97.2°F | Ht 62.25 in | Wt 111.0 lb

## 2019-01-04 DIAGNOSIS — Z23 Encounter for immunization: Secondary | ICD-10-CM

## 2019-01-04 DIAGNOSIS — E2839 Other primary ovarian failure: Secondary | ICD-10-CM | POA: Diagnosis not present

## 2019-01-04 DIAGNOSIS — J449 Chronic obstructive pulmonary disease, unspecified: Secondary | ICD-10-CM | POA: Diagnosis not present

## 2019-01-04 DIAGNOSIS — Z0000 Encounter for general adult medical examination without abnormal findings: Secondary | ICD-10-CM | POA: Diagnosis not present

## 2019-01-04 DIAGNOSIS — Z720 Tobacco use: Secondary | ICD-10-CM

## 2019-01-04 DIAGNOSIS — E785 Hyperlipidemia, unspecified: Secondary | ICD-10-CM

## 2019-01-04 DIAGNOSIS — Z1231 Encounter for screening mammogram for malignant neoplasm of breast: Secondary | ICD-10-CM

## 2019-01-04 LAB — CBC WITH DIFFERENTIAL/PLATELET
Basophils Absolute: 0 10*3/uL (ref 0.0–0.1)
Basophils Relative: 0.5 % (ref 0.0–3.0)
Eosinophils Absolute: 0.3 10*3/uL (ref 0.0–0.7)
Eosinophils Relative: 4.7 % (ref 0.0–5.0)
HCT: 39.4 % (ref 36.0–46.0)
Hemoglobin: 13.5 g/dL (ref 12.0–15.0)
Lymphocytes Relative: 46.9 % — ABNORMAL HIGH (ref 12.0–46.0)
Lymphs Abs: 2.9 10*3/uL (ref 0.7–4.0)
MCHC: 34.2 g/dL (ref 30.0–36.0)
MCV: 107.4 fl — ABNORMAL HIGH (ref 78.0–100.0)
Monocytes Absolute: 0.7 10*3/uL (ref 0.1–1.0)
Monocytes Relative: 10.8 % (ref 3.0–12.0)
Neutro Abs: 2.3 10*3/uL (ref 1.4–7.7)
Neutrophils Relative %: 37.1 % — ABNORMAL LOW (ref 43.0–77.0)
Platelets: 308 10*3/uL (ref 150.0–400.0)
RBC: 3.67 Mil/uL — ABNORMAL LOW (ref 3.87–5.11)
RDW: 14.9 % (ref 11.5–15.5)
WBC: 6.1 10*3/uL (ref 4.0–10.5)

## 2019-01-04 LAB — LIPID PANEL
Cholesterol: 206 mg/dL — ABNORMAL HIGH (ref 0–200)
HDL: 82 mg/dL (ref >39.00)
LDL Cholesterol: 99 mg/dL (ref 0–99)
NonHDL: 123.83
Total CHOL/HDL Ratio: 3
Triglycerides: 125 mg/dL (ref 0.0–149.0)
VLDL: 25 mg/dL (ref 0.0–40.0)

## 2019-01-04 LAB — COMPREHENSIVE METABOLIC PANEL
ALT: 7 U/L (ref 0–35)
AST: 17 U/L (ref 0–37)
Albumin: 4.3 g/dL (ref 3.5–5.2)
Alkaline Phosphatase: 72 U/L (ref 39–117)
BUN: 6 mg/dL (ref 6–23)
CO2: 30 mEq/L (ref 19–32)
Calcium: 9.4 mg/dL (ref 8.4–10.5)
Chloride: 102 mEq/L (ref 96–112)
Creatinine, Ser: 0.55 mg/dL (ref 0.40–1.20)
GFR: 111.53 mL/min (ref >60.00)
Glucose, Bld: 87 mg/dL (ref 70–99)
Potassium: 3.4 mEq/L — ABNORMAL LOW (ref 3.5–5.1)
Sodium: 142 mEq/L (ref 135–145)
Total Bilirubin: 0.4 mg/dL (ref 0.2–1.2)
Total Protein: 6.8 g/dL (ref 6.0–8.3)

## 2019-01-04 LAB — TSH: TSH: 1.99 u[IU]/mL (ref 0.35–4.50)

## 2019-01-04 IMAGING — DX DG CHEST 2V
2 series · 2 of 2 positions shown · non-contrast
Comparison: [DATE]

CLINICAL DATA: Tobacco use.

EXAM:
CHEST - 2 VIEW

[chest pa]
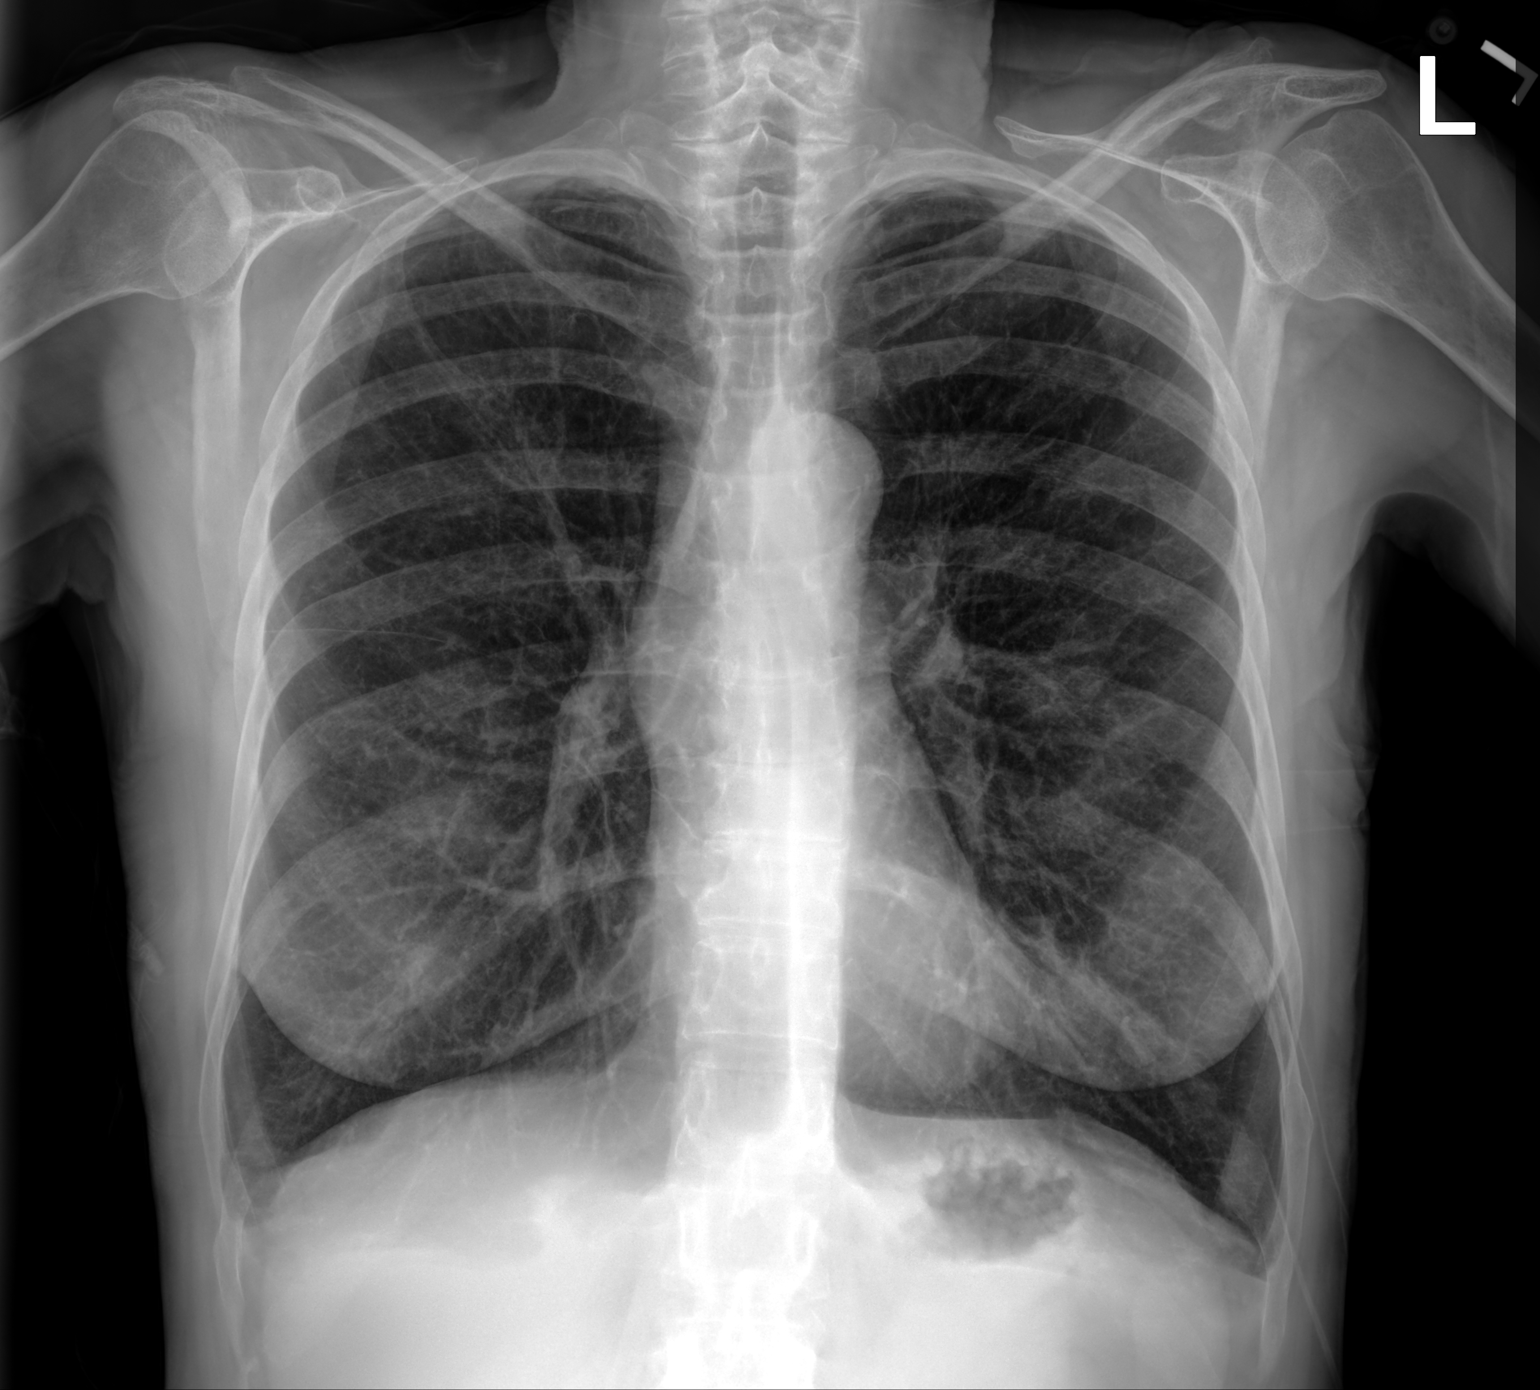

[chest lat]
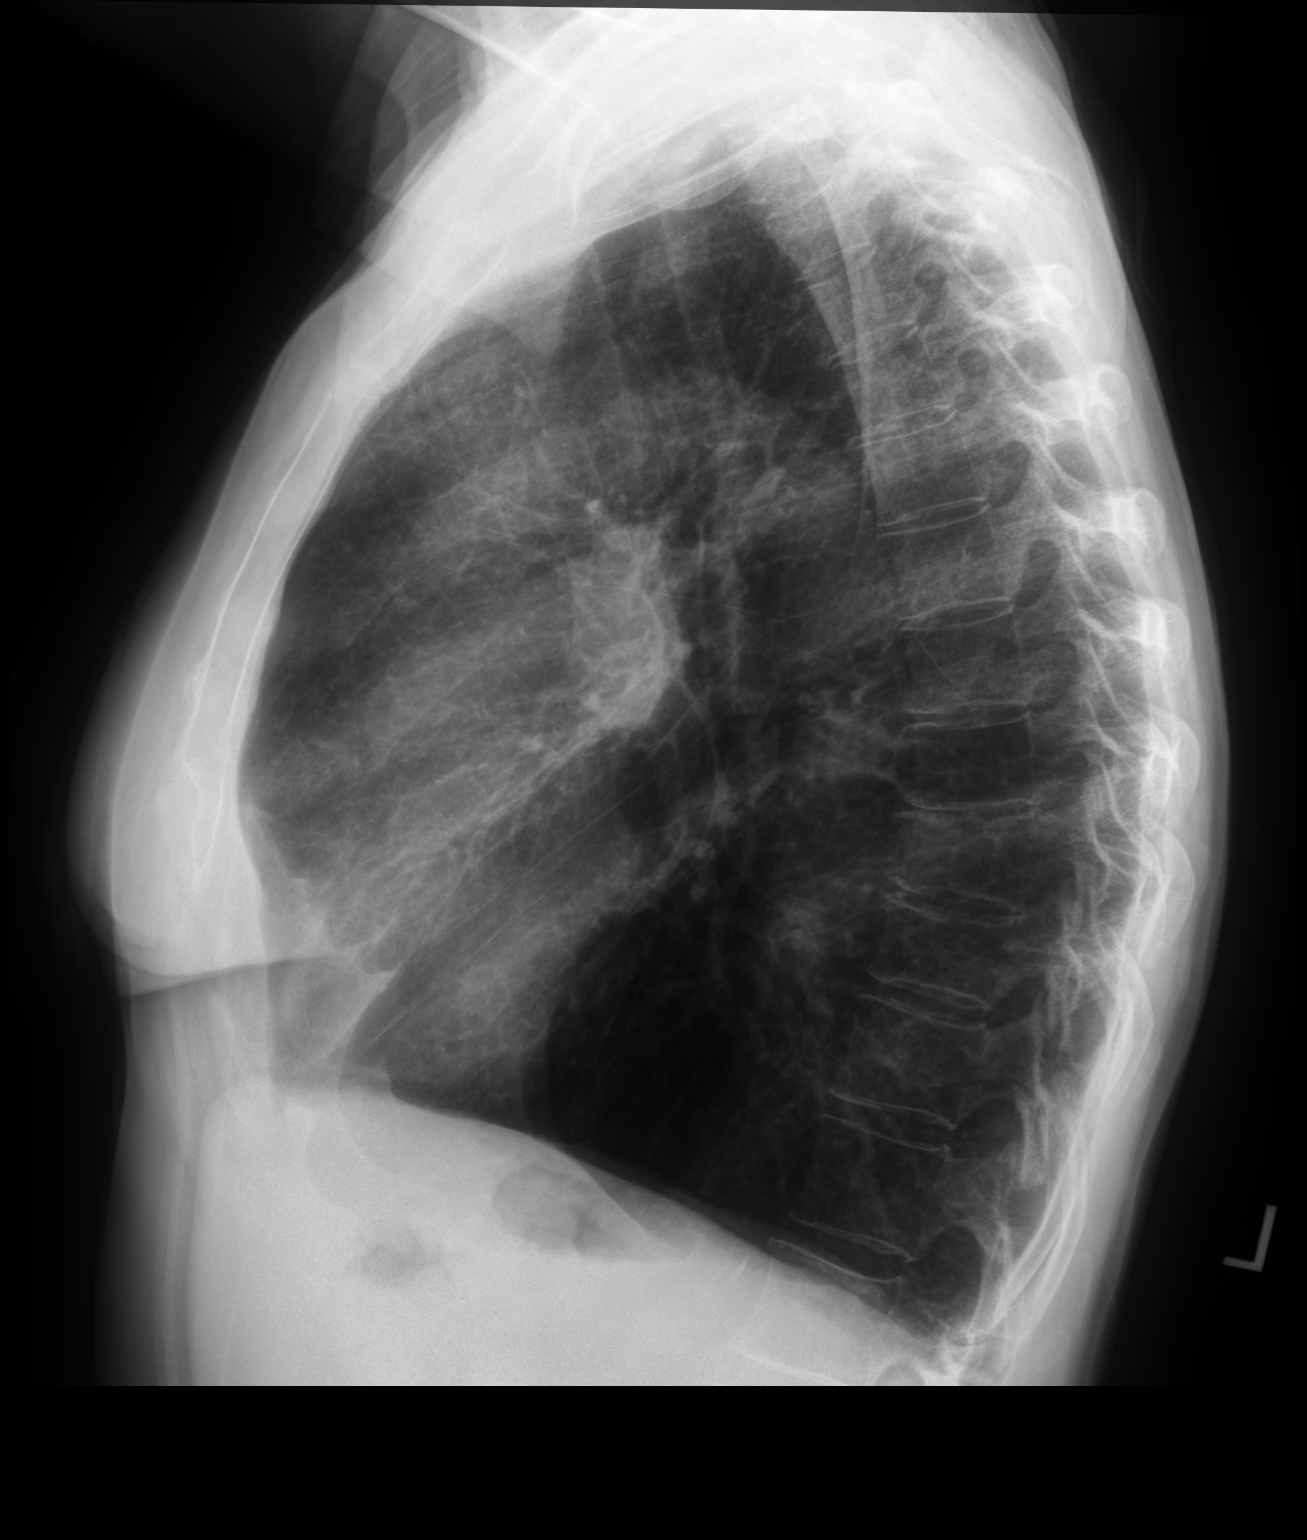

[2 of 2 positions shown; findings below may reference images not displayed]

FINDINGS: The heart size and mediastinal contours are within normal limits.
Aortic atherosclerosis. Pulmonary hyperinflation and flattening of
hemidiaphragms is consistent with COPD.

Mild streaky opacity in the right upper lobe is new since previous
study. This may be due to scarring, atelectasis, or early pneumonia.
No evidence of pleural effusion. The visualized skeletal structures
are unremarkable.
IMPRESSION: Mild streaky right upper lobe opacity is new since [O4].
Differential diagnosis includes scarring, atelectasis, or early
pneumonia. Recommend clinical correlation and short-term chest
radiographic follow-up.

COPD.

## 2019-01-04 MED ORDER — ALBUTEROL SULFATE HFA 108 (90 BASE) MCG/ACT IN AERS: 2.0000 | INHALATION_SPRAY | Freq: Four times a day (QID) | RESPIRATORY_TRACT | 2 refills | Status: DC | PRN

## 2019-01-04 NOTE — Addendum Note (Signed)
Addended by: Suzette Battiest on: 01/04/2019 10:39 AM   Modules accepted: Orders

## 2019-01-04 NOTE — Addendum Note (Signed)
Addended by: Miles Costain T on: 01/04/2019 10:31 AM   Modules accepted: Orders

## 2019-01-04 NOTE — Patient Instructions (Signed)
It was great seeing you today.   We will follow up with you regarding your blood work and xray   Please work on quitting smoking

## 2019-01-04 NOTE — Progress Notes (Signed)
Subjective:    Patient ID: Sue Chase, female    DOB: December 11, 1955, 63 y.o.   MRN: [id number]  HPI Patient presents for yearly preventative medicine examination. She is a pleasant 63 year old female who  has a past medical history of Colon polyps, Hyperlipidemia, Rheumatic fever, Seasonal allergies, and UTI (urinary tract infection).   Hyperlipidemia - takes Pravastatin 20 mg. She denies myalgias or fatigue  Lab Results  Component Value Date   CHOL 236 (H) 06/29/2017   HDL 65.10 06/29/2017   LDLDIRECT 128.0 06/29/2017   TRIG 269.0 (H) 06/29/2017   CHOLHDL 4 06/29/2017    GERD - takes Prilosec 20 mg OTC PRN  Tobacco Use -  She continues to smoke about a pack a day. She understands that she needs to quit but does not wish to do so. She denies chest pain or shortness of breath.   All immunizations and health maintenance protocols were reviewed with the patient and needed orders were placed. She is due for tdap   Appropriate screening laboratory values were ordered for the patient including screening of hyperlipidemia, renal function and hepatic function.   Medication reconciliation,  past medical history, social history, problem list and allergies were reviewed in detail with the patient  Goals were established with regard to weight loss, exercise, and  diet in compliance with medications  End of life planning was discussed.   Review of Systems  Constitutional: Negative.   HENT: Negative.   Eyes: Negative.   Respiratory: Negative.   Cardiovascular: Negative.   Gastrointestinal: Negative.   Endocrine: Negative.   Genitourinary: Negative.   Musculoskeletal: Negative.   Skin: Negative.   Allergic/Immunologic: Negative.   Neurological: Negative.   Hematological: Negative.   Psychiatric/Behavioral: Negative.    Past Medical History:  Diagnosis Date  . Colon polyps   . Hyperlipidemia   . Rheumatic fever   . Seasonal allergies   . UTI (urinary tract infection)      Social History   Socioeconomic History  . Marital status: Married    Spouse name: Not on file  . Number of children: Not on file  . Years of education: Not on file  . Highest education level: Not on file  Occupational History  . Not on file  Social Needs  . Financial resource strain: Not on file  . Food insecurity    Worry: Not on file    Inability: Not on file  . Transportation needs    Medical: Not on file    Non-medical: Not on file  Tobacco Use  . Smoking status: Current Every Day Smoker    Packs/day: 1.00    Years: 40.00    Pack years: 40.00  . Smokeless tobacco: Never Used  Substance and Sexual Activity  . Alcohol use: Yes    Comment: Rum and Coke/Unsure of amount  . Drug use: Never  . Sexual activity: Not on file  Lifestyle  . Physical activity    Days per week: Not on file    Minutes per session: Not on file  . Stress: Not on file  Relationships  . Social Herbalist on phone: Not on file    Gets together: Not on file    Attends religious service: Not on file    Active member of club or organization: Not on file    Attends meetings of clubs or organizations: Not on file    Relationship status: Not on file  . Intimate partner  violence    Fear of current or ex partner: Not on file    Emotionally abused: Not on file    Physically abused: Not on file    Forced sexual activity: Not on file  Other Topics Concern  . Not on file  Social History Narrative   Married    Two grown children    She enjoys reading, walking    Past Surgical History:  Procedure Laterality Date  . SALIVARY GLAND SURGERY     LATE 80S  . TUBAL LIGATION  1986    Family History  Problem Relation Age of Onset  . Arthritis Mother   . Diabetes Mother   . Heart disease Mother   . Hyperlipidemia Mother   . Hypertension Mother   . Miscarriages / [place] Mother   . Hearing loss Father   . Liver cancer Father   . Lung cancer Father   . Hyperlipidemia Father   .  Diabetes Brother   . Lung disease Maternal Grandfather        Black Lung  . Diabetes Paternal Grandmother   . Cancer Paternal Grandfather   . Diabetes Paternal Grandfather     Allergies  Allergen Reactions  . Codeine Itching    Current Outpatient Medications on File Prior to Visit  Medication Sig Dispense Refill  . omeprazole (PRILOSEC) 20 MG capsule Take 1 capsule (20 mg total) by mouth daily. 30 capsule 3  . pravastatin (PRAVACHOL) 20 MG tablet TAKE ONE TABLET BY MOUTH DAILY 90 tablet 0  . loratadine (CLARITIN) 10 MG tablet Take 10 mg by mouth daily.     No current facility-administered medications on file prior to visit.     BP 110/70   Temp (!) 97.2 F (36.2 C) (Temporal)   Ht 5' 2.25" (1.581 m)   Wt 111 lb (50.3 kg)   BMI 20.14 kg/m       Objective:   Physical Exam Vitals signs and nursing note reviewed.  Constitutional:      General: She is not in acute distress.    Appearance: Normal appearance. She is well-developed.  HENT:     Head: Normocephalic and atraumatic.     Right Ear: Tympanic membrane, ear canal and external ear normal. There is no impacted cerumen.     Left Ear: Tympanic membrane, ear canal and external ear normal. There is no impacted cerumen.     Nose: Nose normal. No congestion or rhinorrhea.     Mouth/Throat:     Mouth: Mucous membranes are moist.     Pharynx: Oropharynx is clear. No oropharyngeal exudate or posterior oropharyngeal erythema.  Eyes:     General:        Right eye: No discharge.        Left eye: No discharge.     Conjunctiva/sclera: Conjunctivae normal.     Pupils: Pupils are equal, round, and reactive to light.  Neck:     Musculoskeletal: Normal range of motion and neck supple.     Thyroid: No thyromegaly.     Trachea: No tracheal deviation.  Cardiovascular:     Rate and Rhythm: Normal rate and regular rhythm.     Pulses: Normal pulses.     Heart sounds: Normal heart sounds. No murmur. No friction rub. No gallop.    Pulmonary:     Effort: Pulmonary effort is normal. No respiratory distress.     Breath sounds: Normal breath sounds. No stridor. No wheezing, rhonchi or rales.  Chest:  Chest wall: No tenderness.  Abdominal:     General: Abdomen is flat. Bowel sounds are normal. There is no distension.     Palpations: Abdomen is soft. There is no mass.     Tenderness: There is no abdominal tenderness. There is no right CVA tenderness, guarding or rebound.     Hernia: No hernia is present.  Musculoskeletal: Normal range of motion.        General: No swelling, tenderness, deformity or signs of injury.     Right lower leg: No edema.     Left lower leg: No edema.  Lymphadenopathy:     Cervical: No cervical adenopathy.  Skin:    General: Skin is warm and dry.     Capillary Refill: Capillary refill takes less than 2 seconds.     Coloration: Skin is not jaundiced or pale.     Findings: No bruising, erythema, lesion or rash.  Neurological:     General: No focal deficit present.     Mental Status: She is alert and oriented to person, place, and time. Mental status is at baseline.     Cranial Nerves: No cranial nerve deficit.     Motor: No weakness.     Coordination: Coordination normal.     Gait: Gait normal.     Deep Tendon Reflexes: Reflexes normal.  Psychiatric:        Mood and Affect: Mood normal.        Behavior: Behavior normal.        Thought Content: Thought content normal.        Judgment: Judgment normal.       Assessment & Plan:  1. Routine general medical examination at a health care facility - Needs to quit smoking  - Follow up in one year or sooner if needed  - CBC with Differential/Platelet - CMP - Lipid panel - TSH  2. Estrogen deficiency  - Screening Mammogram; Future  3. Hyperlipidemia, unspecified hyperlipidemia type - Consider increase in statin  - CBC with Differential/Platelet - CMP - Lipid panel - TSH  4. Tobacco use - encouraged to quit smoking  - DG Chest  2 View; Future

## 2019-01-09 ENCOUNTER — Other Ambulatory Visit: Payer: Self-pay | Admitting: Family Medicine

## 2019-01-09 ENCOUNTER — Other Ambulatory Visit: Payer: Self-pay | Admitting: Adult Health

## 2019-01-09 DIAGNOSIS — R9389 Abnormal findings on diagnostic imaging of other specified body structures: Secondary | ICD-10-CM

## 2019-01-09 MED ORDER — DOXYCYCLINE HYCLATE 100 MG PO CAPS: 100.0000 mg | ORAL_CAPSULE | Freq: Two times a day (BID) | ORAL | 0 refills | Status: DC

## 2019-01-30 ENCOUNTER — Ambulatory Visit: Payer: BC Managed Care – PPO | Admitting: Podiatry

## 2019-02-08 ENCOUNTER — Other Ambulatory Visit: Payer: BC Managed Care – PPO

## 2019-02-25 ENCOUNTER — Other Ambulatory Visit: Payer: Self-pay | Admitting: Adult Health

## 2019-02-25 DIAGNOSIS — E785 Hyperlipidemia, unspecified: Secondary | ICD-10-CM

## 2019-09-09 ENCOUNTER — Other Ambulatory Visit: Payer: Self-pay

## 2019-09-10 ENCOUNTER — Ambulatory Visit (INDEPENDENT_AMBULATORY_CARE_PROVIDER_SITE_OTHER): Payer: BC Managed Care – PPO | Admitting: Family Medicine

## 2019-09-10 ENCOUNTER — Encounter: Payer: Self-pay | Admitting: Family Medicine

## 2019-09-10 VITALS — BP 126/62 | HR 71 | Temp 97.9°F | Wt 110.0 lb

## 2019-09-10 DIAGNOSIS — M5432 Sciatica, left side: Secondary | ICD-10-CM | POA: Diagnosis not present

## 2019-09-10 MED ORDER — HYDROCODONE-ACETAMINOPHEN 5-325 MG PO TABS: 1.0000 | ORAL_TABLET | ORAL | 0 refills | Status: DC | PRN

## 2019-09-10 MED ORDER — PREDNISONE 10 MG PO TABS: ORAL_TABLET | ORAL | 0 refills | Status: DC

## 2019-09-10 NOTE — Patient Instructions (Signed)

## 2019-09-10 NOTE — Progress Notes (Signed)
Established Patient Office Visit  Subjective:  Patient ID: Sue Chase, female    DOB: 1955-03-02  Age: 64 y.o. MRN: [id number]  CC:  Chief Complaint  Patient presents with  . Leg Pain    pt has been having left leg pain since last wednesday started with lower back pain     HPI Sue Chase presents for rather acute onset last week of some low back pain which is left-sided with radiation down left lower extremity.  She and her husband flew out to [place] last week for vacation.  They did consider amount of walking.  There is no specific injury.  She woke up Wednesday morning with some low back pain and radiation down left lower extremity.  She noted some numbness upper left thigh region and also involving her left heel.  No definite weakness.  Pain has been intense at times.  She denies any urine or stool incontinence.  She went to urgent care out in [place] and was prescribed low-dose Flexeril 5 mg which has not helped any with her pain.  She has reported allergy to codeine.  She has not tried hydrocodone to her knowledge.  Her pain has been very intense frequently 9-10 out of 10 and interfering with sleep.  She denies any prior history of back difficulties.  No fevers or chills.  No recent reported appetite or weight changes.  She has not found any alleviating conditions.  Past Medical History:  Diagnosis Date  . Colon polyps   . Hyperlipidemia   . Rheumatic fever   . Seasonal allergies   . UTI (urinary tract infection)     Past Surgical History:  Procedure Laterality Date  . SALIVARY GLAND SURGERY     LATE 80S  . TUBAL LIGATION  1986    Family History  Problem Relation Age of Onset  . Arthritis Mother   . Diabetes Mother   . Heart disease Mother   . Hyperlipidemia Mother   . Hypertension Mother   . Miscarriages / [place] Mother   . Hearing loss Father   . Liver cancer Father   . Lung cancer Father   . Hyperlipidemia Father   . Diabetes Brother    . Lung disease Maternal Grandfather        Black Lung  . Diabetes Paternal Grandmother   . Cancer Paternal Grandfather   . Diabetes Paternal Grandfather     Social History   Socioeconomic History  . Marital status: Married    Spouse name: Not on file  . Number of children: Not on file  . Years of education: Not on file  . Highest education level: Not on file  Occupational History  . Not on file  Tobacco Use  . Smoking status: Current Every Day Smoker    Packs/day: 1.00    Years: 40.00    Pack years: 40.00  . Smokeless tobacco: Never Used  Vaping Use  . Vaping Use: Never used  Substance and Sexual Activity  . Alcohol use: Yes    Comment: Rum and Coke/Unsure of amount  . Drug use: Never  . Sexual activity: Not on file  Other Topics Concern  . Not on file  Social History Narrative   Married    Two grown children    She enjoys reading, walking   Social Determinants of Health   Financial Resource Strain:   . Difficulty of Paying Living Expenses:   Food Insecurity:   . Worried About Estate manager/land agent  of Food in the Last Year:   . [place] in the Last Year:   Transportation Needs:   . Lack of Transportation (Medical):   Marland Kitchen Lack of Transportation (Non-Medical):   Physical Activity:   . Days of Exercise per Week:   . Minutes of Exercise per Session:   Stress:   . Feeling of Stress :   Social Connections:   . Frequency of Communication with Friends and Family:   . Frequency of Social Gatherings with Friends and Family:   . Attends Religious Services:   . Active Member of Clubs or Organizations:   . Attends Archivist Meetings:   Marland Kitchen Marital Status:   Intimate Partner Violence:   . Fear of Current or Ex-Partner:   . Emotionally Abused:   Marland Kitchen Physically Abused:   . Sexually Abused:     Outpatient Medications Prior to Visit  Medication Sig Dispense Refill  . albuterol (VENTOLIN HFA) 108 (90 Base) MCG/ACT inhaler Inhale 2 puffs into the lungs every 6  (six) hours as needed for wheezing or shortness of breath. 6.7 g 2  . doxycycline (VIBRAMYCIN) 100 MG capsule Take 1 capsule (100 mg total) by mouth 2 (two) times daily. 20 capsule 0  . loratadine (CLARITIN) 10 MG tablet Take 10 mg by mouth daily.    Marland Kitchen omeprazole (PRILOSEC) 20 MG capsule Take 1 capsule (20 mg total) by mouth daily. 30 capsule 3  . pravastatin (PRAVACHOL) 20 MG tablet TAKE ONE TABLET BY MOUTH DAILY 30 tablet 11   No facility-administered medications prior to visit.    Allergies  Allergen Reactions  . Codeine Itching    ROS Review of Systems  Constitutional: Negative for appetite change, chills, fever and unexpected weight change.  Respiratory: Negative for shortness of breath.   Cardiovascular: Negative for chest pain.  Gastrointestinal: Negative for abdominal pain, nausea and vomiting.  Genitourinary: Negative for dysuria.  Musculoskeletal: Positive for back pain.  Neurological: Positive for numbness. Negative for weakness.      Objective:    Physical Exam Vitals reviewed.  Cardiovascular:     Rate and Rhythm: Normal rate and regular rhythm.  Pulmonary:     Effort: Pulmonary effort is normal.     Breath sounds: Normal breath sounds.  Musculoskeletal:     Right lower leg: No edema.     Left lower leg: No edema.     Comments: Straight leg raise is positive on the left  Neurological:     General: No focal deficit present.     Mental Status: She is alert.     Comments: She has 1+ ankle reflexes bilaterally and 2+ knee reflexes.  She has good strength with plantarflexion and dorsiflexion bilaterally.  Knee extension limited on the left secondary to pain     BP 126/62 (BP Location: Left Arm, Patient Position: Sitting, Cuff Size: Normal)   Pulse 71   Temp 97.9 F (36.6 C) (Oral)   Wt 110 lb (49.9 kg) Comment: pt reported  SpO2 99%   BMI 19.96 kg/m  Wt Readings from Last 3 Encounters:  09/10/19 110 lb (49.9 kg)  01/04/19 111 lb (50.3 kg)  11/20/17 116  lb (52.6 kg)     Health Maintenance Due  Topic Date Due  . Hepatitis C Screening  Never done  . COVID-19 Vaccine (1) Never done  . HIV Screening  Never done  . PAP SMEAR-Modifier  Never done  . MAMMOGRAM  09/16/2011    There  are no preventive care reminders to display for this patient.  Lab Results  Component Value Date   TSH 1.99 01/04/2019   Lab Results  Component Value Date   WBC 6.1 01/04/2019   HGB 13.5 01/04/2019   HCT 39.4 01/04/2019   MCV 107.4 (H) 01/04/2019   PLT 308.0 01/04/2019   Lab Results  Component Value Date   NA 142 01/04/2019   K 3.4 (L) 01/04/2019   CO2 30 01/04/2019   GLUCOSE 87 01/04/2019   BUN 6 01/04/2019   CREATININE 0.55 01/04/2019   BILITOT 0.4 01/04/2019   ALKPHOS 72 01/04/2019   AST 17 01/04/2019   ALT 7 01/04/2019   PROT 6.8 01/04/2019   ALBUMIN 4.3 01/04/2019   CALCIUM 9.4 01/04/2019   GFR 111.53 01/04/2019   Lab Results  Component Value Date   CHOL 206 (H) 01/04/2019   Lab Results  Component Value Date   HDL 82.00 01/04/2019   Lab Results  Component Value Date   LDLCALC 99 01/04/2019   Lab Results  Component Value Date   TRIG 125.0 01/04/2019   Lab Results  Component Value Date   CHOLHDL 3 01/04/2019   Lab Results  Component Value Date   HGBA1C 5.1 06/29/2017      Assessment & Plan:   Problem List Items Addressed This Visit    None    Visit Diagnoses    Left sided sciatica    -  Primary    Patient presented with acute low back pain with radiation down left lower extremity suggesting sciatica.  No prior history of back difficulties  -Trial of prednisone taper -Wrote for limited hydrocodone 5/325 mg 1 every 4-6 hours as needed for severe pain #30 with no refill.  If she has any itching or other side effects stop and be in touch -Recommend follow-up with primary in 1 week to reassess. -Reviewed signs and symptoms of progressive involvement and follow-up immediately for any urine or stool incontinence,  progressive numbness, or progressive weakness  Meds ordered this encounter  Medications  . predniSONE (DELTASONE) 10 MG tablet    Sig: Taper as follows: 4-4-4-3-3-2-2-1-1    Dispense:  24 tablet    Refill:  0  . HYDROcodone-acetaminophen (NORCO/VICODIN) 5-325 MG tablet    Sig: Take 1 tablet by mouth every 4 (four) hours as needed for moderate pain.    Dispense:  30 tablet    Refill:  0    Follow-up: Return in about 1 week (around 09/17/2019).    Carolann Littler, MD

## 2019-09-17 ENCOUNTER — Other Ambulatory Visit: Payer: Self-pay

## 2019-09-17 ENCOUNTER — Ambulatory Visit (INDEPENDENT_AMBULATORY_CARE_PROVIDER_SITE_OTHER): Payer: BC Managed Care – PPO | Admitting: Adult Health

## 2019-09-17 ENCOUNTER — Encounter: Payer: Self-pay | Admitting: Adult Health

## 2019-09-17 VITALS — BP 140/84 | Temp 98.1°F

## 2019-09-17 DIAGNOSIS — M5442 Lumbago with sciatica, left side: Secondary | ICD-10-CM

## 2019-09-17 MED ORDER — TIZANIDINE HCL 4 MG PO TABS: 4.0000 mg | ORAL_TABLET | Freq: Four times a day (QID) | ORAL | 0 refills | Status: DC | PRN

## 2019-09-17 MED ORDER — OXYCODONE-ACETAMINOPHEN 10-325 MG PO TABS: 1.0000 | ORAL_TABLET | Freq: Three times a day (TID) | ORAL | 0 refills | Status: AC | PRN

## 2019-09-17 NOTE — Progress Notes (Signed)
Subjective:    Patient ID: Sue Chase, female    DOB: 02-Mar-1955, 64 y.o.   MRN: [id number]  HPI  64 year old female who  has a past medical history of Colon polyps, Hyperlipidemia, Rheumatic fever, Seasonal allergies, and UTI (urinary tract infection).  She presents to the office today for one week follow up regarding left sided sciatica. She was seen a week ago by another provider in the office for low back pain which radiated down the left lower extremity.  Her husband and she flew out to [place] the week prior for vacation.  While in [place] they did a considerable amount of walking.  She woke up one morning with some low back pain with radiating pain down the left lower extremity.  She noted numbness in her left upper thigh region and also involving her left heel.  She went to urgent care while out in [place] and was prescribed low-dose Flexeril which did not help with the pain.  She was seen earlier she was having very intense 9 out of 10 pain that was interfering with her sleep.  She has no history of prior back surgeries or back pain.  She was trialed on a prednisone taper and a short course of Norco.  She reports over the last week she has not had much improvement in pain despite taking Norco 5 mg.  She continues to have difficulty walking and her pain is intense often 9 out of 10.  Continues to have low back pain with radiating pain down her left leg.  She denies incontinence of stool or bladder  Review of Systems See HPI   Past Medical History:  Diagnosis Date  . Colon polyps   . Hyperlipidemia   . Rheumatic fever   . Seasonal allergies   . UTI (urinary tract infection)     Social History   Socioeconomic History  . Marital status: Married    Spouse name: Not on file  . Number of children: Not on file  . Years of education: Not on file  . Highest education level: Not on file  Occupational History  . Not on file  Tobacco Use  . Smoking status: Current  Every Day Smoker    Packs/day: 1.00    Years: 40.00    Pack years: 40.00  . Smokeless tobacco: Never Used  Vaping Use  . Vaping Use: Never used  Substance and Sexual Activity  . Alcohol use: Yes    Comment: Rum and Coke/Unsure of amount  . Drug use: Never  . Sexual activity: Not on file  Other Topics Concern  . Not on file  Social History Narrative   Married    Two grown children    She enjoys reading, walking   Social Determinants of Health   Financial Resource Strain:   . Difficulty of Paying Living Expenses:   Food Insecurity:   . Worried About Charity fundraiser in the Last Year:   . Arboriculturist in the Last Year:   Transportation Needs:   . Film/video editor (Medical):   Marland Kitchen Lack of Transportation (Non-Medical):   Physical Activity:   . Days of Exercise per Week:   . Minutes of Exercise per Session:   Stress:   . Feeling of Stress :   Social Connections:   . Frequency of Communication with Friends and Family:   . Frequency of Social Gatherings with Friends and Family:   . Attends Religious Services:   .  Active Member of Clubs or Organizations:   . Attends Archivist Meetings:   Marland Kitchen Marital Status:   Intimate Partner Violence:   . Fear of Current or Ex-Partner:   . Emotionally Abused:   Marland Kitchen Physically Abused:   . Sexually Abused:     Past Surgical History:  Procedure Laterality Date  . SALIVARY GLAND SURGERY     LATE 80S  . TUBAL LIGATION  1986    Family History  Problem Relation Age of Onset  . Arthritis Mother   . Diabetes Mother   . Heart disease Mother   . Hyperlipidemia Mother   . Hypertension Mother   . Miscarriages / [place] Mother   . Hearing loss Father   . Liver cancer Father   . Lung cancer Father   . Hyperlipidemia Father   . Diabetes Brother   . Lung disease Maternal Grandfather        Black Lung  . Diabetes Paternal Grandmother   . Cancer Paternal Grandfather   . Diabetes Paternal Grandfather     Allergies    Allergen Reactions  . Codeine Itching    Current Outpatient Medications on File Prior to Visit  Medication Sig Dispense Refill  . albuterol (VENTOLIN HFA) 108 (90 Base) MCG/ACT inhaler Inhale 2 puffs into the lungs every 6 (six) hours as needed for wheezing or shortness of breath. 6.7 g 2  . HYDROcodone-acetaminophen (NORCO/VICODIN) 5-325 MG tablet Take 1 tablet by mouth every 4 (four) hours as needed for moderate pain. 30 tablet 0  . pravastatin (PRAVACHOL) 20 MG tablet TAKE ONE TABLET BY MOUTH DAILY 30 tablet 11  . predniSONE (DELTASONE) 10 MG tablet Taper as follows: 4-4-4-3-3-2-2-1-1 24 tablet 0  . loratadine (CLARITIN) 10 MG tablet Take 10 mg by mouth daily. (Patient not taking: Reported on 09/17/2019)    . omeprazole (PRILOSEC) 20 MG capsule Take 1 capsule (20 mg total) by mouth daily. (Patient not taking: Reported on 09/17/2019) 30 capsule 3   No current facility-administered medications on file prior to visit.    BP (!) 140/84   Temp 98.1 F (36.7 C)       Objective:   Physical Exam Vitals and nursing note reviewed.  Constitutional:      Appearance: Normal appearance.  Musculoskeletal:        General: Tenderness present.     Right lower leg: 1+ Edema present.     Left lower leg: 1+ Edema present.  Skin:    General: Skin is warm and dry.     Capillary Refill: Capillary refill takes less than 2 seconds.  Neurological:     General: No focal deficit present.     Mental Status: She is alert and oriented to person, place, and time.     Comments: In wheel chair for exam   Psychiatric:        Mood and Affect: Mood normal.        Behavior: Behavior normal.        Thought Content: Thought content normal.        Judgment: Judgment normal.       Assessment & Plan:  1. Acute bilateral low back pain with left-sided sciatica -Due to intense pain and difficulty walking will order MRI of lumbar spine and refer to physical therapy.  Will prescribe Zanaflex and short course of  Percocet. She knows not to take these two medications together. Can use Motrin in between dosing of percocet  - MR LUMBAR SPINE WO  CONTRAST; Future - Ambulatory referral to Physical Therapy - tiZANidine (ZANAFLEX) 4 MG tablet; Take 1 tablet (4 mg total) by mouth every 6 (six) hours as needed for muscle spasms.  Dispense: 30 tablet; Refill: 0 - oxyCODONE-acetaminophen (PERCOCET) 10-325 MG tablet; Take 1 tablet by mouth every 8 (eight) hours as needed for up to 7 days for pain.  Dispense: 20 tablet; Refill: 0  [name], NP

## 2019-09-23 ENCOUNTER — Emergency Department (HOSPITAL_COMMUNITY): Payer: BC Managed Care – PPO

## 2019-09-23 ENCOUNTER — Telehealth: Payer: Self-pay | Admitting: *Deleted

## 2019-09-23 ENCOUNTER — Emergency Department (HOSPITAL_COMMUNITY)
Admission: EM | Admit: 2019-09-23 | Discharge: 2019-09-23 | Disposition: A | Discharge status: Home / Self Care | Payer: BC Managed Care – PPO | Attending: Emergency Medicine | Admitting: Emergency Medicine

## 2019-09-23 ENCOUNTER — Encounter (HOSPITAL_COMMUNITY): Payer: Self-pay | Admitting: Emergency Medicine

## 2019-09-23 ENCOUNTER — Other Ambulatory Visit: Payer: Self-pay

## 2019-09-23 DIAGNOSIS — R2689 Other abnormalities of gait and mobility: Secondary | ICD-10-CM | POA: Diagnosis not present

## 2019-09-23 DIAGNOSIS — M545 Low back pain: Secondary | ICD-10-CM | POA: Diagnosis not present

## 2019-09-23 DIAGNOSIS — M47817 Spondylosis without myelopathy or radiculopathy, lumbosacral region: Secondary | ICD-10-CM | POA: Diagnosis not present

## 2019-09-23 DIAGNOSIS — G8929 Other chronic pain: Secondary | ICD-10-CM

## 2019-09-23 DIAGNOSIS — F17210 Nicotine dependence, cigarettes, uncomplicated: Secondary | ICD-10-CM | POA: Diagnosis not present

## 2019-09-23 DIAGNOSIS — E876 Hypokalemia: Secondary | ICD-10-CM | POA: Insufficient documentation

## 2019-09-23 DIAGNOSIS — S3219XA Other fracture of sacrum, initial encounter for closed fracture: Secondary | ICD-10-CM | POA: Diagnosis not present

## 2019-09-23 DIAGNOSIS — M47816 Spondylosis without myelopathy or radiculopathy, lumbar region: Secondary | ICD-10-CM | POA: Diagnosis not present

## 2019-09-23 DIAGNOSIS — M5126 Other intervertebral disc displacement, lumbar region: Secondary | ICD-10-CM | POA: Diagnosis not present

## 2019-09-23 DIAGNOSIS — M79662 Pain in left lower leg: Secondary | ICD-10-CM | POA: Insufficient documentation

## 2019-09-23 LAB — URINALYSIS, ROUTINE W REFLEX MICROSCOPIC
Bacteria, UA: NONE SEEN
Glucose, UA: NEGATIVE mg/dL
Hgb urine dipstick: NEGATIVE
Ketones, ur: 5 mg/dL — AB
Leukocytes,Ua: NEGATIVE
Nitrite: NEGATIVE
Protein, ur: 30 mg/dL — AB
Specific Gravity, Urine: 1.025 (ref 1.005–1.030)
pH: 7 (ref 5.0–8.0)

## 2019-09-23 LAB — CBC
HCT: 38.9 % (ref 36.0–46.0)
Hemoglobin: 12.8 g/dL (ref 12.0–15.0)
MCH: 33.9 pg (ref 26.0–34.0)
MCHC: 32.9 g/dL (ref 30.0–36.0)
MCV: 102.9 fL — ABNORMAL HIGH (ref 80.0–100.0)
Platelets: 342 10*3/uL (ref 150–400)
RBC: 3.78 MIL/uL — ABNORMAL LOW (ref 3.87–5.11)
RDW: 14.3 % (ref 11.5–15.5)
WBC: 8.4 10*3/uL (ref 4.0–10.5)
nRBC: 0 % (ref 0.0–0.2)

## 2019-09-23 LAB — BASIC METABOLIC PANEL
Anion gap: 14 (ref 5–15)
BUN: 6 mg/dL — ABNORMAL LOW (ref 8–23)
CO2: 29 mmol/L (ref 22–32)
Calcium: 8.7 mg/dL — ABNORMAL LOW (ref 8.9–10.3)
Chloride: 98 mmol/L (ref 98–111)
Creatinine, Ser: 0.52 mg/dL (ref 0.44–1.00)
GFR calc Af Amer: >60 mL/min (ref >60)
GFR calc non Af Amer: >60 mL/min (ref >60)
Glucose, Bld: 75 mg/dL (ref 70–99)
Potassium: 2.9 mmol/L — ABNORMAL LOW (ref 3.5–5.1)
Sodium: 141 mmol/L (ref 135–145)

## 2019-09-23 IMAGING — MR MR LUMBAR SPINE WO/W CM
4 of 7 series · 19 of 48 positions shown · IV contrast (gadavist)
Comparison: None.

CLINICAL DATA: Low back pain

EXAM:
MRI LUMBAR SPINE WITHOUT AND WITH CONTRAST
TECHNIQUE: Multiplanar and multiecho pulse sequences of the lumbar spine were
obtained without and with intravenous contrast.
CONTRAST:  5mL GADAVIST GADOBUTROL 1 MMOL/ML IV SOLN

[Series 2: T2 · sagittal · 4.0mm · 0.55mm/px · 5 of 15 slices shown (1 of 2)]
[im 1/15]
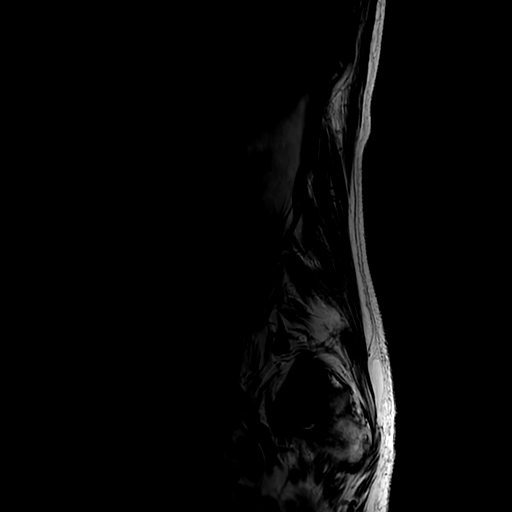
[im 4/15]
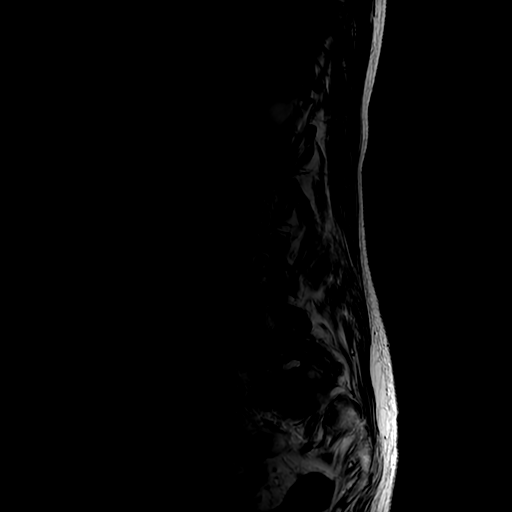
[im 8/15]
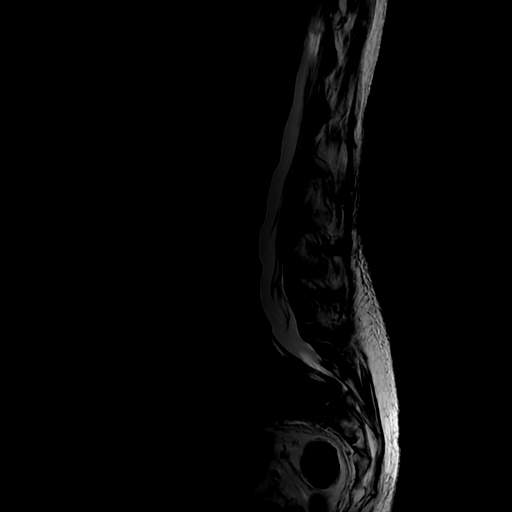
[im 11/15]
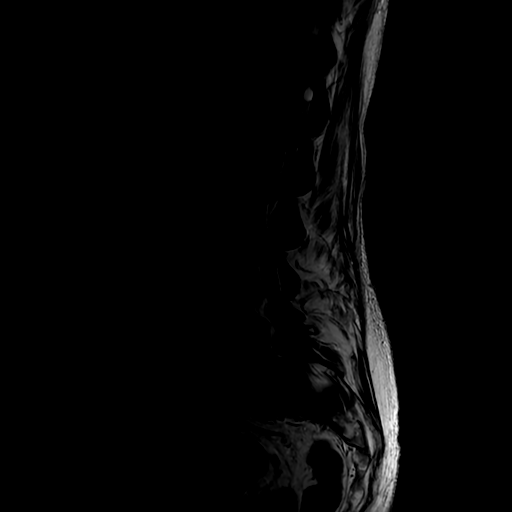
[im 15/15]
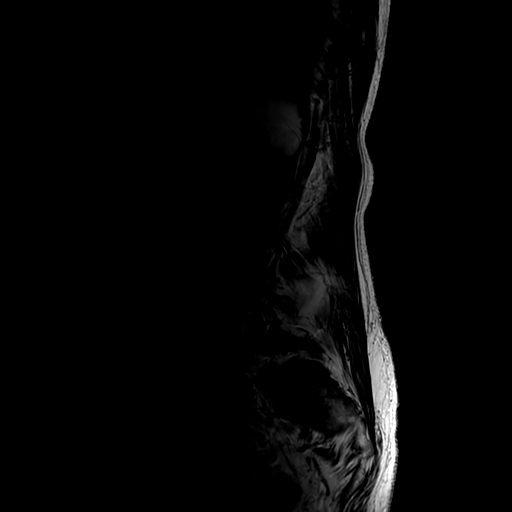

[Series 4: T1 · sagittal · 4.0mm · 0.51mm/px · 3 of 15 slices shown (1 of 2)]
[im 1/15]
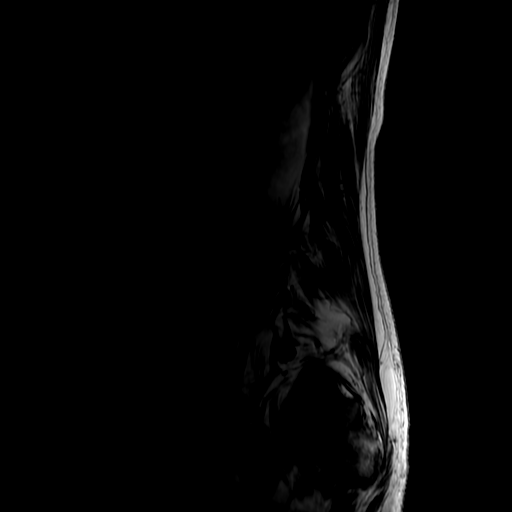
[im 10/15]
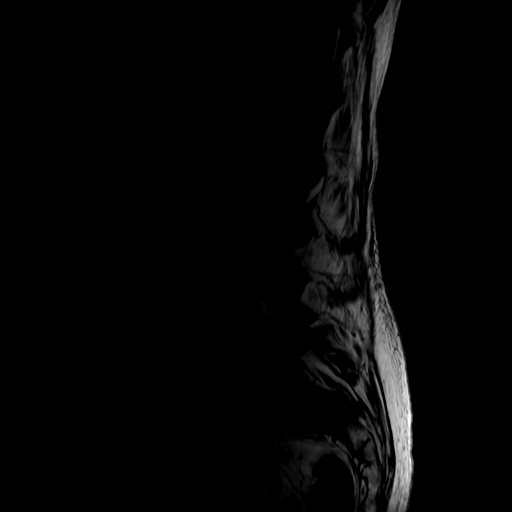
[im 15/15]
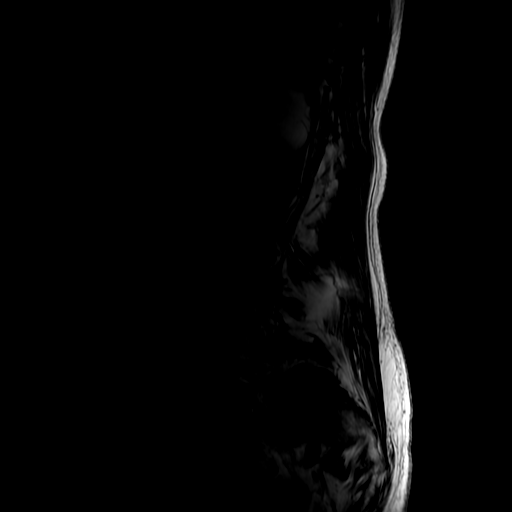

[Series 5: T2 · axial · 4.0mm · 0.39mm/px · z∈[-36,+172]mm · 8 of 37 slices shown (2 of 2)]
[im 1/37]
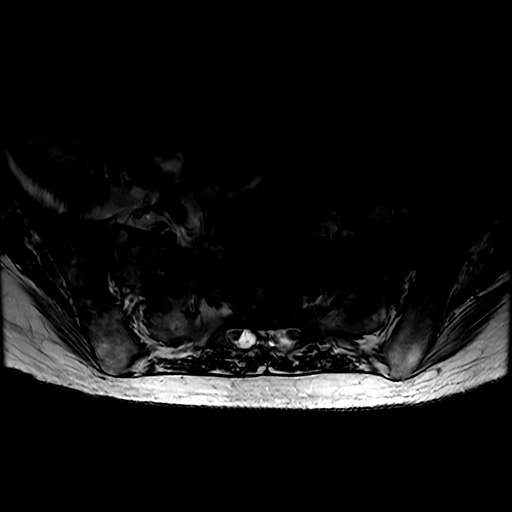
[im 5/37]
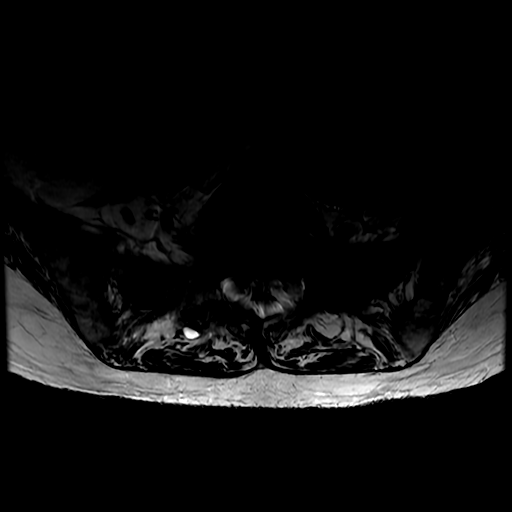
[im 13/37]
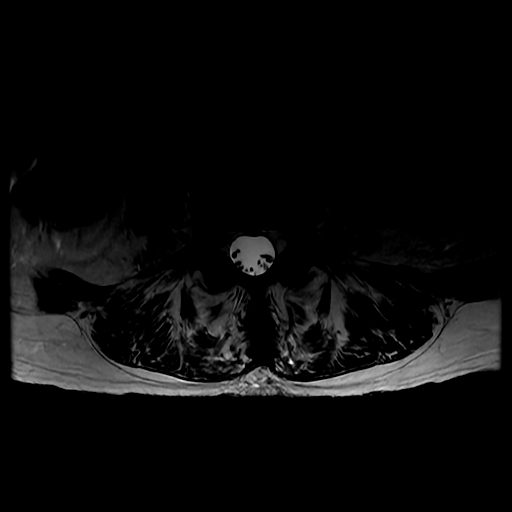
[im 17/37]
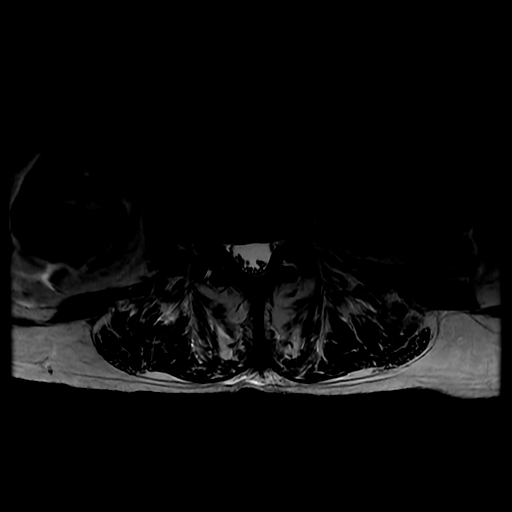
[im 21/37]
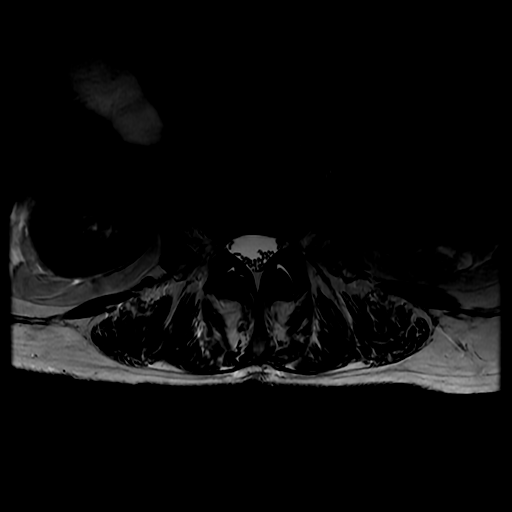
[im 25/37]
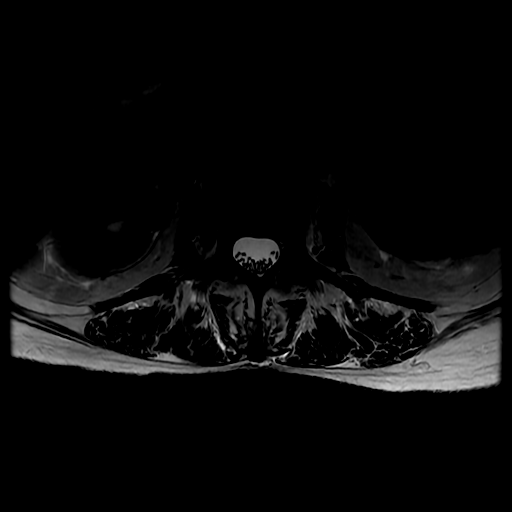
[im 33/37]
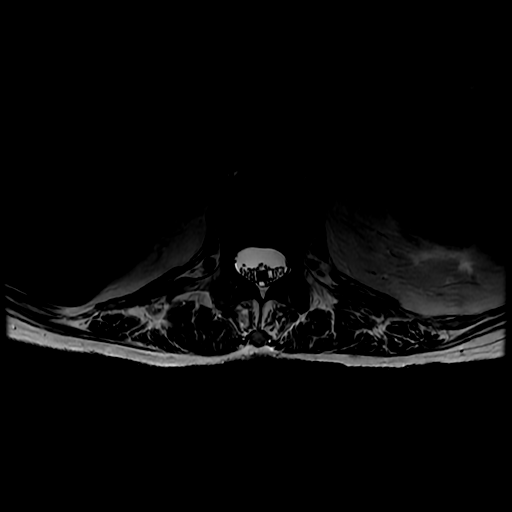
[im 37/37]
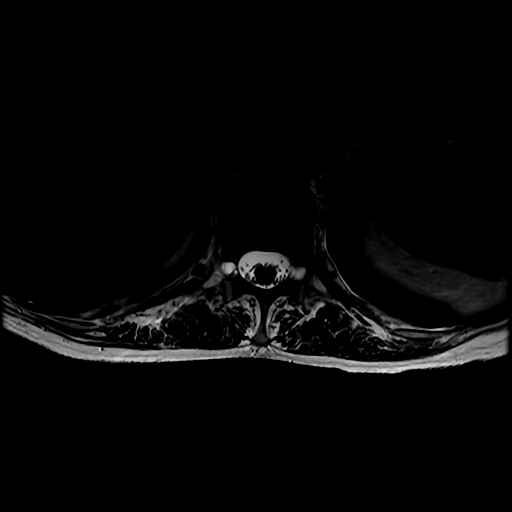

[Series 6: T1 · axial · 4.0mm · 0.39mm/px · z∈[-16,+153]mm · 3 of 37 slices shown (2 of 2)]
[im 5/37]
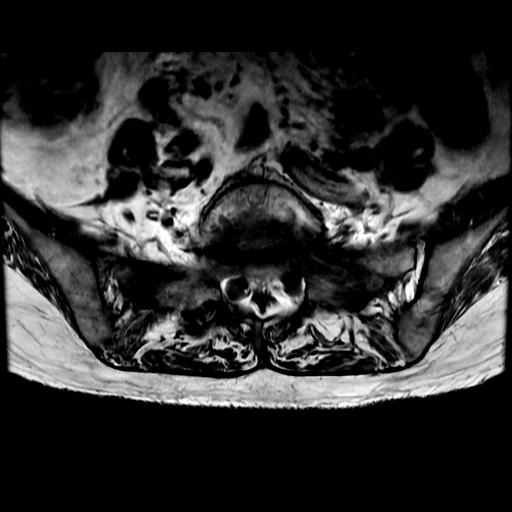
[im 21/37]
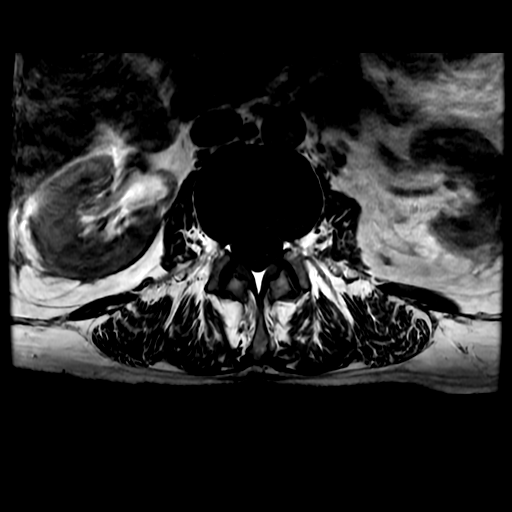
[im 33/37]
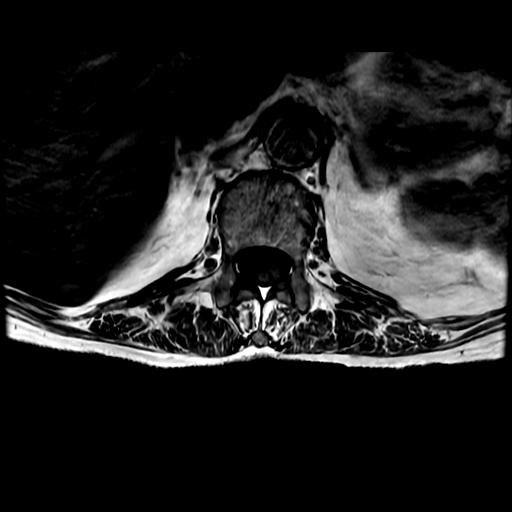

[19 of 48 positions shown; findings below may reference images not displayed]

FINDINGS: Segmentation:  Normal

Alignment:  Mild retrolisthesis L1-2, L2-3, L3-4.

Vertebrae: Negative for lumbar fracture. There is a sacral fracture
through the S1-2 disc space and also extending into the sacral ala
bilaterally. These areas show enhancement however there is minimal
edema. This is likely a subacute to chronic sacral insufficiency
fracture.

Conus medullaris and cauda equina: Conus extends to the L1-2 level.
Conus and cauda equina appear normal.

Paraspinal and other soft tissues: Negative

Disc levels:

L1-2: Negative

L2-3: Mild facet degeneration. Negative for disc protrusion or
stenosis

L3-4: Disc degeneration with Schmorl's node. Mild endplate spurring
and mild facet degeneration. Negative for spinal or foraminal
stenosis

L4-5: Disc degeneration with fatty endplate changes. Mild endplate
spurring and mild facet degeneration. Negative for spinal or
foraminal stenosis.

L5-S1: Disc degeneration with diffuse disc bulging. Bilateral facet
hypertrophy. No significant stenosis.
IMPRESSION: Sacral fracture bilaterally most consistent with subacute to chronic
sacral insufficiency fracture. Correlate with pain symptoms.

Multilevel degenerative change in the lumbar spine without focal
disc protrusion. No significant stenosis.

## 2019-09-23 MED ORDER — GADOBUTROL 1 MMOL/ML IV SOLN
5.0000 mL | Freq: Once | INTRAVENOUS | Status: AC | PRN
  Administered 2019-09-23: 5 mL via INTRAVENOUS

## 2019-09-23 MED ORDER — ORPHENADRINE CITRATE 30 MG/ML IJ SOLN: 60.0000 mg | Freq: Two times a day (BID) | INTRAMUSCULAR | Status: DC

## 2019-09-23 MED ORDER — OXYCODONE HCL 5 MG PO TABS: 10.0000 mg | ORAL_TABLET | ORAL | 0 refills | Status: DC | PRN

## 2019-09-23 MED ORDER — SODIUM CHLORIDE 0.9% FLUSH
3.0000 mL | Freq: Once | INTRAVENOUS | Status: AC
  Administered 2019-09-23: 3 mL via INTRAVENOUS

## 2019-09-23 MED ORDER — ACETAMINOPHEN 325 MG PO TABS
650.0000 mg | ORAL_TABLET | Freq: Four times a day (QID) | ORAL | 0 refills | Status: DC
Start: 2019-09-23 — End: 2021-06-03

## 2019-09-23 MED ORDER — KETOROLAC TROMETHAMINE 60 MG/2ML IM SOLN
60.0000 mg | Freq: Once | INTRAMUSCULAR | Status: AC
  Administered 2019-09-23: 60 mg via INTRAMUSCULAR
  Filled 2019-09-23: qty 2

## 2019-09-23 MED ORDER — POTASSIUM CHLORIDE CRYS ER 20 MEQ PO TBCR
40.0000 meq | EXTENDED_RELEASE_TABLET | Freq: Once | ORAL | Status: AC
  Administered 2019-09-23: 40 meq via ORAL
  Filled 2019-09-23: qty 2

## 2019-09-23 MED ORDER — METHOCARBAMOL 500 MG PO TABS
1000.0000 mg | ORAL_TABLET | Freq: Once | ORAL | Status: AC
  Administered 2019-09-23: 1000 mg via ORAL
  Filled 2019-09-23: qty 2

## 2019-09-23 MED ORDER — KETOROLAC TROMETHAMINE 60 MG/2ML IM SOLN: 60.0000 mg | Freq: Once | INTRAMUSCULAR | Status: DC

## 2019-09-23 NOTE — ED Notes (Signed)
Patient verbalizes understanding of discharge instructions. Opportunity for questioning and answers were provided. Armband removed by staff, pt discharged from ED. Pt. ambulatory and discharged home.  

## 2019-09-23 NOTE — Discharge Instructions (Addendum)
Please follow-up with family medicine; you will need PT for sacral fractures.  Please follow-up with orthopedics.  Continue prescribed medications for pain.

## 2019-09-23 NOTE — Telephone Encounter (Signed)
Patient called nurse triage line on  09/23/2019 at 7:11:25 am.  --Caller states she was in office last Tuesday for a follow-up on sciatica pain and is getting worse. Pain is mostly in left buttock and leg and some in right side. She is cramping every time she moves. States she is taking oxycodone 10/325 and tizanidine every 6 hours for muscle spasms.   Patient advised to go to ED. Patient went to ED on 09/23/2019.

## 2019-09-23 NOTE — ED Triage Notes (Signed)
Patient arrives to ED with complaints of lower back pain that radiates down to her left leg for the past two weeks. Pt states it all started during her long plane ride to [place]. Pt states that she has an MRI scheduled but the pain has gotten too intense to wait.

## 2019-09-23 NOTE — ED Provider Notes (Signed)
[facility] EMERGENCY DEPARTMENT Provider Note   CSN: [id number] Arrival date & time: 09/23/19  4098     History Chief Complaint  Patient presents with  . Back Pain  . Leg Pain    Sue Chase is a 64 y.o. female.  HPI  Patient presents to the emergency department from home.  The patient complains of back and leg pain.  She endorses having the symptoms for several weeks.  They have been worsening in the time period.  She denies any falls, injury or trauma.  Patient states her symptomatology began on a airplane trip to [place].  She has been seen by PCP both times.  Her therapies have included muscle relaxers and opioids with minimal relief.  She describes her pain as a cramping sensation mostly on the left lower extremity but then also on the right.  Denies any red flag symptoms such as fever, chills, IVDU, loss of bowel or bladder control, or decreased perineal sensation.      Past Medical History:  Diagnosis Date  . Colon polyps   . Hyperlipidemia   . Rheumatic fever   . Seasonal allergies   . UTI (urinary tract infection)     Patient Active Problem List   Diagnosis Date Noted  . Lower extremity edema 06/29/2017  . Hyperlipidemia 06/29/2017  . Tobacco use 06/29/2017    Past Surgical History:  Procedure Laterality Date  . SALIVARY GLAND SURGERY     LATE 80S  . TUBAL LIGATION  1986     OB History   No obstetric history on file.     Family History  Problem Relation Age of Onset  . Arthritis Mother   . Diabetes Mother   . Heart disease Mother   . Hyperlipidemia Mother   . Hypertension Mother   . Miscarriages / [place] Mother   . Hearing loss Father   . Liver cancer Father   . Lung cancer Father   . Hyperlipidemia Father   . Diabetes Brother   . Lung disease Maternal Grandfather        Black Lung  . Diabetes Paternal Grandmother   . Cancer Paternal Grandfather   . Diabetes Paternal Grandfather     Social History    Tobacco Use  . Smoking status: Current Every Day Smoker    Packs/day: 1.00    Years: 40.00    Pack years: 40.00  . Smokeless tobacco: Never Used  Vaping Use  . Vaping Use: Never used  Substance Use Topics  . Alcohol use: Yes    Comment: Rum and Coke/Unsure of amount  . Drug use: Never    Home Medications Prior to Admission medications   Medication Sig Start Date End Date Taking? Authorizing Provider  albuterol (VENTOLIN HFA) 108 (90 Base) MCG/ACT inhaler Inhale 2 puffs into the lungs every 6 (six) hours as needed for wheezing or shortness of breath. 01/04/19  Yes Nafziger, Tommi Rumps, NP  ibuprofen (ADVIL) 400 MG tablet Take 400 mg by mouth every 6 (six) hours as needed.   Yes [provider]  oxyCODONE-acetaminophen (PERCOCET) 10-325 MG tablet Take 1 tablet by mouth every 8 (eight) hours as needed for up to 7 days for pain. 09/17/19 09/24/19 Yes Nafziger, Tommi Rumps, NP  pravastatin (PRAVACHOL) 20 MG tablet TAKE ONE TABLET BY MOUTH DAILY 02/26/19  Yes Nafziger, Tommi Rumps, NP  tiZANidine (ZANAFLEX) 4 MG tablet Take 1 tablet (4 mg total) by mouth every 6 (six) hours as needed for muscle spasms. 09/17/19  Yes Nafziger, Tommi Rumps, NP  acetaminophen (TYLENOL) 325 MG tablet Take 2 tablets (650 mg total) by mouth in the morning, at noon, in the evening, and at bedtime. 09/23/19   Mesner, Corene Cornea, MD  omeprazole (PRILOSEC) 20 MG capsule Take 1 capsule (20 mg total) by mouth daily. Patient not taking: Reported on 09/17/2019 11/20/17   [name], NP  oxyCODONE (ROXICODONE) 5 MG immediate release tablet Take 2 tablets (10 mg total) by mouth every 4 (four) hours as needed for severe pain. 09/23/19   Mesner, Corene Cornea, MD    Allergies    Codeine  Review of Systems   Review of Systems  Constitutional: Negative for chills and fever.  HENT: Negative for ear pain and sore throat.   Eyes: Negative for pain and visual disturbance.  Respiratory: Negative for cough and shortness of breath.   Cardiovascular: Negative for  chest pain and palpitations.  Gastrointestinal: Negative for abdominal pain and vomiting.  Genitourinary: Negative for dysuria and hematuria.  Musculoskeletal: Positive for back pain and gait problem. Negative for arthralgias.  Skin: Negative for color change and rash.  Neurological: Negative for seizures and syncope.  All other systems reviewed and are negative.   Physical Exam Updated Vital Signs BP 138/64   Pulse (!) 55   Temp 98 F (36.7 C) (Oral)   Resp 15   SpO2 97%   Physical Exam Vitals and nursing note reviewed.  Constitutional:      General: She is not in acute distress.    Appearance: Normal appearance. She is well-developed and normal weight. She is not ill-appearing or toxic-appearing.  HENT:     Head: Normocephalic and atraumatic.     Right Ear: Tympanic membrane normal.     Left Ear: Tympanic membrane normal.  Eyes:     Conjunctiva/sclera: Conjunctivae normal.  Cardiovascular:     Rate and Rhythm: Normal rate and regular rhythm.     Heart sounds: No murmur heard.   Pulmonary:     Effort: Pulmonary effort is normal. No respiratory distress.     Breath sounds: Normal breath sounds.  Abdominal:     Palpations: Abdomen is soft.     Tenderness: There is no abdominal tenderness.  Musculoskeletal:     Cervical back: Neck supple.     Lumbar back: No tenderness or bony tenderness. Positive left straight leg raise test. Negative right straight leg raise test.  Skin:    General: Skin is warm and dry.  Neurological:     Mental Status: She is alert and oriented to person, place, and time. Mental status is at baseline.     GCS: GCS eye subscore is 4. GCS verbal subscore is 5. GCS motor subscore is 6.     Motor: No weakness, abnormal muscle tone, seizure activity or pronator drift.  Psychiatric:        Mood and Affect: Mood normal.        Behavior: Behavior normal.     ED Results / Procedures / Treatments   Labs (all labs ordered are listed, but only abnormal  results are displayed) Labs Reviewed  BASIC METABOLIC PANEL - Abnormal; Notable for the following components:      Result Value   Potassium 2.9 (*)    BUN 6 (*)    Calcium 8.7 (*)    All other components within normal limits  CBC - Abnormal; Notable for the following components:   RBC 3.78 (*)    MCV 102.9 (*)    All  other components within normal limits  URINALYSIS, ROUTINE W REFLEX MICROSCOPIC - Abnormal; Notable for the following components:   Color, Urine AMBER (*)    Bilirubin Urine SMALL (*)    Ketones, ur 5 (*)    Protein, ur 30 (*)    All other components within normal limits    EKG None  Radiology MR Lumbar Spine W Wo Contrast  Result Date: 09/23/2019 CLINICAL DATA:  Low back pain EXAM: MRI LUMBAR SPINE WITHOUT AND WITH CONTRAST TECHNIQUE: Multiplanar and multiecho pulse sequences of the lumbar spine were obtained without and with intravenous contrast. CONTRAST:  4mL GADAVIST GADOBUTROL 1 MMOL/ML IV SOLN COMPARISON:  None. FINDINGS: Segmentation:  Normal Alignment:  Mild retrolisthesis L1-2, L2-3, L3-4. Vertebrae: Negative for lumbar fracture. There is a sacral fracture through the S1-2 disc space and also extending into the sacral ala bilaterally. These areas show enhancement however there is minimal edema. This is likely a subacute to chronic sacral insufficiency fracture. Conus medullaris and cauda equina: Conus extends to the L1-2 level. Conus and cauda equina appear normal. Paraspinal and other soft tissues: Negative Disc levels: L1-2: Negative L2-3: Mild facet degeneration. Negative for disc protrusion or stenosis L3-4: Disc degeneration with Schmorl's node. Mild endplate spurring and mild facet degeneration. Negative for spinal or foraminal stenosis L4-5: Disc degeneration with fatty endplate changes. Mild endplate spurring and mild facet degeneration. Negative for spinal or foraminal stenosis. L5-S1: Disc degeneration with diffuse disc bulging. Bilateral facet hypertrophy.  No significant stenosis. IMPRESSION: Sacral fracture bilaterally most consistent with subacute to chronic sacral insufficiency fracture. Correlate with pain symptoms. Multilevel degenerative change in the lumbar spine without focal disc protrusion. No significant stenosis. Electronically Signed   By: Franchot Gallo M.D.   On: 09/23/2019 19:48    Procedures Procedures (including critical care time)  Medications Ordered in ED Medications  sodium chloride flush (NS) 0.9 % injection 3 mL (3 mLs Intravenous Given 09/23/19 1855)  potassium chloride SA (KLOR-CON) CR tablet 40 mEq (40 mEq Oral Given 09/23/19 1829)  ketorolac (TORADOL) injection 60 mg (60 mg Intramuscular Given 09/23/19 1829)  methocarbamol (ROBAXIN) tablet 1,000 mg (1,000 mg Oral Given 09/23/19 1828)  gadobutrol (GADAVIST) 1 MMOL/ML injection 5 mL (5 mLs Intravenous Contrast Given 09/23/19 1920)    ED Course  Sue Chase is a 64 y.o. female with PMHx listed that presents to the Emergency Department complaint of Back Pain and Leg Pain      ED Course: Initial exam completed.   Well-appearing hemodynamically stable.  Nontoxic and afebrile.  Physical exam significant for age-appropriate 64 year old female with pain with straight leg raise on the left, otherwise neurovascular intact with no evidence of weakness or abnormal tone in the lower extremities.  Initial differential includes spinal emergency such as cauda equina, chronic back pain, electrolyte abnormalities, and abscess.   No red flag symptoms however patient has had progressive functional decline.  Labs reviewed.  Given the above concerns, will proceed with MRI lumbar spine to rule out compressive etiology.  BMP with hypokalemia 2.9 patient does take daily potassium supplementation but is missed several doses.  CBC no evidence of leukocytosis or leukopenia stable hemoglobin.  UA without obvious signs of infection. Potassium repleted.  Robaxin and Toradol for pain control.  MRI  with sacral fractures bilaterally consistent with subacute/chronic sacral insufficiency fractures she does correlate with the patient's pain.  No evidence of cord compression.  Although unlikely, believe patient would benefit from outpatient DVT study which was ordered and  asked patient to obtain study at their convenience.    Diagnostics Vital Signs: reviewed Labs: reviewed and significant findings discussed above Imaging: personally reviewed images interpreted by radiology Records: previous records reviewed and pertinent data discussed   Consults:  None   Reevaluation/Disposition:  Upon reevaluation, patients symptoms stable. No active nausea/vomiting prior to discharge from the emergency department.    All questions answered.  Strict return precautions were discussed. Additionally we discussed establishing and/or following-up with primary care physician.  Patient and/or family was understanding and in agreement with today's assessment and plan.   Sherolyn Buba, MD Emergency Medicine, PGY-3   Note: Dragon medical dictation software was used in the creation of this note.   Final Clinical Impression(s) / ED Diagnoses Final diagnoses:  Chronic bilateral low back pain without sciatica    Rx / DC Orders ED Discharge Orders         Ordered    oxyCODONE (ROXICODONE) 5 MG immediate release tablet  Every 4 hours PRN     Discontinue  Reprint     09/23/19 1828    acetaminophen (TYLENOL) 325 MG tablet  4 times daily     Discontinue  Reprint     09/23/19 1828    VAS [place] LOWER EXTREMITY VENOUS (DVT)        09/23/19 Patrecia Pour           Frann Rider, MD 09/23/19 [id number]    Merrily Pew, MD 09/23/19 2324

## 2019-09-24 ENCOUNTER — Telehealth: Payer: Self-pay | Admitting: Adult Health

## 2019-09-24 ENCOUNTER — Ambulatory Visit (HOSPITAL_COMMUNITY): Admission: RE | Admit: 2019-09-24 | Payer: BC Managed Care – PPO | Source: Ambulatory Visit

## 2019-09-24 NOTE — Telephone Encounter (Signed)
Pt scheduled for virtual visit on 09/25/19.  Please discuss at that time.

## 2019-09-24 NOTE — Telephone Encounter (Signed)
Pt was transferred in by triage nurse. Pt is wanting a referral to an orthopedic and also wants physical therapy. She had an MRI done at the hospital on 08/02. She also needs a refill on Her medication oxyCODONE-acetaminophen (PERCOCET) 10-325 MG tablet   Please advise

## 2019-09-25 ENCOUNTER — Telehealth (INDEPENDENT_AMBULATORY_CARE_PROVIDER_SITE_OTHER): Payer: BC Managed Care – PPO | Admitting: Adult Health

## 2019-09-25 ENCOUNTER — Encounter: Payer: Self-pay | Admitting: Adult Health

## 2019-09-25 VITALS — Wt 110.0 lb

## 2019-09-25 DIAGNOSIS — S3210XA Unspecified fracture of sacrum, initial encounter for closed fracture: Secondary | ICD-10-CM | POA: Diagnosis not present

## 2019-09-25 DIAGNOSIS — M519 Unspecified thoracic, thoracolumbar and lumbosacral intervertebral disc disorder: Secondary | ICD-10-CM

## 2019-09-25 MED ORDER — VITAMIN D (ERGOCALCIFEROL) 1.25 MG (50000 UNIT) PO CAPS: 50000.0000 [IU] | ORAL_CAPSULE | ORAL | 0 refills | Status: AC

## 2019-09-25 NOTE — Progress Notes (Signed)
Virtual Visit via Video Note  I connected with Sue Chase  on 09/25/19 at 10:30 AM EDT by a video enabled telemedicine application and verified that I am speaking with the correct person using two identifiers.  Location patient: home Location provider:work or home office Persons participating in the virtual visit: patient, provider  I discussed the limitations of evaluation and management by telemedicine and the availability of in person appointments. The patient expressed understanding and agreed to proceed.   HPI: 64 year old female who is being evaluated today for follow-up after recent emergency room visit for low back pain.  Previously she was seen in this office by myself and another primary care provider regarding back pain with left-sided sciatica.  She and her husband had flown to [place] and did considerable amount of walking.  She woke up 1 morning with some low back pain with radiating pain down her left extremity.  She noted numbness in her left upper thigh region and also involving her left heel.  She went to urgent care while in [place] was prescribed low-dose Flexeril which did not help with the pain.  She has been also trialed on short prednisone taper as well as Norco and muscle relaxers.  She has not gotten much relief from any of these treatments.  On 09/23/2019 she went to the emergency room for worsening pain and had an MRI done that showed  Sacral fracture bilaterally most consistent with subacute to chronic sacral insufficiency fracture is well as multilevel degenerative changes in the lumbar spine with diffuse disc bulging at L5-S1.  She was giving her oxycodone for discharge and she has been taking this without much improvement in her pain.    ROS: See pertinent positives and negatives per HPI.  Past Medical History:  Diagnosis Date  . Colon polyps   . Hyperlipidemia   . Rheumatic fever   . Seasonal allergies   . UTI (urinary tract infection)     Past  Surgical History:  Procedure Laterality Date  . SALIVARY GLAND SURGERY     LATE 80S  . TUBAL LIGATION  1986    Family History  Problem Relation Age of Onset  . Arthritis Mother   . Diabetes Mother   . Heart disease Mother   . Hyperlipidemia Mother   . Hypertension Mother   . Miscarriages / [place] Mother   . Hearing loss Father   . Liver cancer Father   . Lung cancer Father   . Hyperlipidemia Father   . Diabetes Brother   . Lung disease Maternal Grandfather        Black Lung  . Diabetes Paternal Grandmother   . Cancer Paternal Grandfather   . Diabetes Paternal Grandfather        Current Outpatient Medications:  .  acetaminophen (TYLENOL) 325 MG tablet, Take 2 tablets (650 mg total) by mouth in the morning, at noon, in the evening, and at bedtime., Disp: 30 tablet, Rfl: 0 .  albuterol (VENTOLIN HFA) 108 (90 Base) MCG/ACT inhaler, Inhale 2 puffs into the lungs every 6 (six) hours as needed for wheezing or shortness of breath., Disp: 6.7 g, Rfl: 2 .  ibuprofen (ADVIL) 400 MG tablet, Take 400 mg by mouth every 6 (six) hours as needed., Disp: , Rfl:  .  omeprazole (PRILOSEC) 20 MG capsule, Take 1 capsule (20 mg total) by mouth daily., Disp: 30 capsule, Rfl: 3 .  oxyCODONE (ROXICODONE) 5 MG immediate release tablet, Take 2 tablets (10 mg total) by mouth every  4 (four) hours as needed for severe pain., Disp: 30 tablet, Rfl: 0 .  pravastatin (PRAVACHOL) 20 MG tablet, TAKE ONE TABLET BY MOUTH DAILY, Disp: 30 tablet, Rfl: 11 .  tiZANidine (ZANAFLEX) 4 MG tablet, Take 1 tablet (4 mg total) by mouth every 6 (six) hours as needed for muscle spasms., Disp: 30 tablet, Rfl: 0  EXAM:  VITALS per patient if applicable:  GENERAL: alert, oriented, appears well and in no acute distress  HEENT: atraumatic, conjunttiva clear, no obvious abnormalities on inspection of external nose and ears  NECK: normal movements of the head and neck  LUNGS: on inspection no signs of respiratory  distress, breathing rate appears normal, no obvious gross SOB, gasping or wheezing  CV: no obvious cyanosis  MS: moves all visible extremities without noticeable abnormality  PSYCH/NEURO: pleasant and cooperative, no obvious depression or anxiety, speech and thought processing grossly intact  ASSESSMENT AND PLAN:  Discussed the following assessment and plan:  1. Closed fracture of sacrum, unspecified portion of sacrum, initial encounter ([place]) -Concern for osteoporosis due to sacral fracture without aggravating trauma.  When she is recovered from this injury we will get her in for a DEXA scan.  In the meantime we will prescribe vitamin D 50,000 units weekly x60 days to help with fracture healing - Vitamin D, Ergocalciferol, (DRISDOL) 1.25 MG (50000 UNIT) CAPS capsule; Take 1 capsule (50,000 Units total) by mouth every 7 (seven) days.  Dispense: 8 capsule; Refill: 0  2. 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 The patient was provided an opportunity to ask questions and all were answered. The patient agreed with the plan and demonstrated an understanding of the instructions.   The patient was advised to call back or seek an in-person evaluation if the symptoms worsen or if the condition fails to improve as anticipated.   [name], NP

## 2019-09-26 ENCOUNTER — Other Ambulatory Visit: Payer: Self-pay | Admitting: Adult Health

## 2019-09-26 ENCOUNTER — Telehealth: Payer: Self-pay | Admitting: Adult Health

## 2019-09-26 MED ORDER — OXYCODONE HCL 10 MG PO TABS: 10.0000 mg | ORAL_TABLET | Freq: Four times a day (QID) | ORAL | 0 refills | Status: DC | PRN

## 2019-09-26 NOTE — Telephone Encounter (Signed)
Pt said she needs something for the pain in her legs. She tried tylenol but it isn't working. She has about 4 of her Roxicodone left.  [place].  Please advise

## 2019-09-26 NOTE — Telephone Encounter (Signed)
I have refilled her oxycodone for 10 mg tabs x 30 tabs.

## 2019-09-26 NOTE — Telephone Encounter (Signed)
Pt notified that rx has been sent to the pharmacy.  Nothing further needed. 

## 2019-10-03 ENCOUNTER — Telehealth: Payer: Self-pay | Admitting: Adult Health

## 2019-10-03 NOTE — Telephone Encounter (Signed)
Pt call and stated she need a refill on Oxycodone HCl 10 MG TABS ard tiZANidine (ZANAFLEX) 4 MG tablet sent to  [facility] [address], [place] - [address] Phone:  [phone]  Fax:  [phone]

## 2019-10-04 DIAGNOSIS — Z681 Body mass index (BMI) 19 or less, adult: Secondary | ICD-10-CM | POA: Diagnosis not present

## 2019-10-04 DIAGNOSIS — M8448XA Pathological fracture, other site, initial encounter for fracture: Secondary | ICD-10-CM | POA: Diagnosis not present

## 2019-10-04 DIAGNOSIS — R030 Elevated blood-pressure reading, without diagnosis of hypertension: Secondary | ICD-10-CM | POA: Diagnosis not present

## 2019-10-06 ENCOUNTER — Other Ambulatory Visit: Payer: BC Managed Care – PPO

## 2019-10-07 ENCOUNTER — Telehealth: Payer: Self-pay | Admitting: Adult Health

## 2019-10-07 NOTE — Telephone Encounter (Signed)
Pt called to say she needs more pain medication Oxycodone HCl 10 MG TABS    She went to a neurologist Friday and he prescribed her 30 5mg  tablets and she will be out tomorrow.  [facility] [place] [address], [place] [facility] [address]  [address] [place] [address]  Phone:  [phone] Fax:  [phone]   Please call pt when ordered [phone]

## 2019-10-08 ENCOUNTER — Other Ambulatory Visit: Payer: Self-pay | Admitting: Adult Health

## 2019-10-08 DIAGNOSIS — M5442 Lumbago with sciatica, left side: Secondary | ICD-10-CM

## 2019-10-08 MED ORDER — OXYCODONE HCL 10 MG PO TABS: 10.0000 mg | ORAL_TABLET | Freq: Four times a day (QID) | ORAL | 0 refills | Status: DC

## 2019-10-08 MED ORDER — TIZANIDINE HCL 4 MG PO TABS: 4.0000 mg | ORAL_TABLET | Freq: Four times a day (QID) | ORAL | 0 refills | Status: DC | PRN

## 2019-10-08 NOTE — Telephone Encounter (Signed)
Sent in by [facility].  Nothing further needed.

## 2019-10-10 NOTE — Telephone Encounter (Signed)
Pt is requested a refill on Oxycodone she stated she will run out by Sunday and wanted to go ahead and call it in so that way on Sunday it will be ready for pick up.   [facility] [place] [address] [place] [address] Phone:  [phone]  Fax:  [phone]

## 2019-10-11 ENCOUNTER — Other Ambulatory Visit: Payer: Self-pay | Admitting: Adult Health

## 2019-10-11 ENCOUNTER — Other Ambulatory Visit: Payer: Self-pay | Admitting: Neurosurgery

## 2019-10-11 DIAGNOSIS — M8448XS Pathological fracture, other site, sequela: Secondary | ICD-10-CM

## 2019-10-11 MED ORDER — OXYCODONE HCL 10 MG PO TABS: 10.0000 mg | ORAL_TABLET | Freq: Four times a day (QID) | ORAL | 0 refills | Status: AC

## 2019-10-11 NOTE — Telephone Encounter (Signed)
Patient is aware 

## 2019-10-11 NOTE — Telephone Encounter (Signed)
I have sent in 5 additional days but this will have to last her until she is seen by orthopedics and they will need to take over pain management as I am not able to send in any additional refills of pain medication

## 2019-10-14 ENCOUNTER — Other Ambulatory Visit: Payer: Self-pay

## 2019-10-14 ENCOUNTER — Ambulatory Visit
Admission: RE | Admit: 2019-10-14 | Discharge: 2019-10-14 | Disposition: A | Discharge status: Home / Self Care | Payer: BC Managed Care – PPO | Source: Ambulatory Visit | Attending: Neurosurgery | Admitting: Neurosurgery

## 2019-10-14 DIAGNOSIS — Z780 Asymptomatic menopausal state: Secondary | ICD-10-CM | POA: Diagnosis not present

## 2019-10-14 DIAGNOSIS — M8589 Other specified disorders of bone density and structure, multiple sites: Secondary | ICD-10-CM | POA: Diagnosis not present

## 2019-10-14 DIAGNOSIS — M8448XS Pathological fracture, other site, sequela: Secondary | ICD-10-CM

## 2019-10-15 ENCOUNTER — Ambulatory Visit (INDEPENDENT_AMBULATORY_CARE_PROVIDER_SITE_OTHER): Payer: BC Managed Care – PPO | Admitting: Physical Therapy

## 2019-10-15 ENCOUNTER — Encounter: Payer: Self-pay | Admitting: Physical Therapy

## 2019-10-15 DIAGNOSIS — M5442 Lumbago with sciatica, left side: Secondary | ICD-10-CM

## 2019-10-16 ENCOUNTER — Telehealth: Payer: Self-pay | Admitting: Adult Health

## 2019-10-16 ENCOUNTER — Other Ambulatory Visit: Payer: Self-pay | Admitting: Neurosurgery

## 2019-10-16 ENCOUNTER — Other Ambulatory Visit (HOSPITAL_COMMUNITY): Payer: Self-pay | Admitting: Interventional Radiology

## 2019-10-16 DIAGNOSIS — M8448XA Pathological fracture, other site, initial encounter for fracture: Secondary | ICD-10-CM

## 2019-10-16 NOTE — Telephone Encounter (Signed)
Pt requested a refill  Medication:  OXYCODONE  Pharmacy:  [facility] [address], [place] [address] Phone:  [phone]  Fax:  [phone]

## 2019-10-16 NOTE — Telephone Encounter (Signed)
Message Routed to PCP CMA 

## 2019-10-17 ENCOUNTER — Ambulatory Visit (INDEPENDENT_AMBULATORY_CARE_PROVIDER_SITE_OTHER): Payer: BC Managed Care – PPO | Admitting: Physical Therapy

## 2019-10-17 ENCOUNTER — Other Ambulatory Visit: Payer: Self-pay

## 2019-10-17 ENCOUNTER — Other Ambulatory Visit: Payer: Self-pay | Admitting: Adult Health

## 2019-10-17 ENCOUNTER — Telehealth: Payer: Self-pay | Admitting: Adult Health

## 2019-10-17 ENCOUNTER — Encounter: Payer: Self-pay | Admitting: Physical Therapy

## 2019-10-17 DIAGNOSIS — M5442 Lumbago with sciatica, left side: Secondary | ICD-10-CM | POA: Diagnosis not present

## 2019-10-17 MED ORDER — TRAMADOL HCL 50 MG PO TABS: 50.0000 mg | ORAL_TABLET | Freq: Two times a day (BID) | ORAL | 0 refills | Status: AC | PRN

## 2019-10-17 MED ORDER — OXYCODONE HCL 10 MG PO TABS: 10.0000 mg | ORAL_TABLET | ORAL | 0 refills | Status: DC | PRN

## 2019-10-17 NOTE — Telephone Encounter (Signed)
Spoke to pt and she was hysterical and stated that she does not want to try a new Rx going through the weekemd. She stated if it didn't help she would be hurting w/o relief until Tuesday when [place] returns. Pt was offered a f/u appt. With [facility] as he advised previous message pt declined and stated that she doe not need to talk to [place]. I advised pt that her only option at this point is to try the tramdol to see if it helps. Pt became upset and stated that " if its anything less than the oxycodone then I dont wont it bc it wont help, I need the pain medication!" I advised pt that she may want to try the medication instead of assuming that it may not work. Pt stated that she cannot see anyone until next month. I advised pt that since she is seeing ortho PT today they may can help her. Pt stated " no she cant she's not a Librarian, academic!". I asked pt if she wanted to try the tramadol or ask PT for advise or schedule an appt. Pt stated she would just like to try the Tramadol.     Can you please send in Tramadol?

## 2019-10-17 NOTE — Telephone Encounter (Signed)
Patient notified. Patient stated that the only thing that helped with her pain is the Oxycodone. Patient declined Rx for Tramadol. Patient stated that she has been going through this pain since July 14th  and this is just "cruel that she cannot get any pain medication and have to go through the whole weekend without it. It seems like no one cares that she is in pain everyday". After asking about Tramadol again, she said you can tell him what I said and hung up the phone.

## 2019-10-17 NOTE — Telephone Encounter (Signed)
As we discussed last week I am unable to send in any more prescriptions for oxycodone, I would be happy to send in a short course of tramadol until she is seen by orthopedics or she can follow-up and we can discuss other pain management options

## 2019-10-17 NOTE — Telephone Encounter (Signed)
Pt called back and said for [facility] to call her.

## 2019-10-17 NOTE — Telephone Encounter (Signed)
I spoke to the patient as she was upset that we would no longer be providing oxycodone to her.  She was tearful and at times hysterical on the phone stating that she is "tired of being in pain" and is upset that she cannot get appointments with neurosurgery or for her CAT scan in a timely manner, per patient "none of these doctors really care about me and there does not seem to be any urgency to do anything for me".  Her neurosurgeon, Dr. Marcello Moores ordered a CT scan and she has a scheduled for October 24, 2019.  She is also started working with physical therapy to help with her pain management  Her MRI on 09/23/2019 showed    IMPRESSION: Sacral fracture bilaterally most consistent with subacute to chronic sacral insufficiency fracture. Correlate with pain symptoms.  Multilevel degenerative change in the lumbar spine without focal disc protrusion. No significant stenosis.  I advised her that I do not deal with chronic pain and due to regulations I can only send in a narcotic pain medication for short period of time.  She may need to follow-up with her neurosurgeon for better pain management.  I came to an agreement with her that I would send in 8 more tabs of oxycodone ( that would be the last prescription of oxycodone)  and then would prescribe her a short course of tramadol until she could touch base with her neurosurgeon

## 2019-10-18 ENCOUNTER — Encounter: Payer: Self-pay | Admitting: Physical Therapy

## 2019-10-18 NOTE — Therapy (Signed)
[facility] [address] [place], [place], [address] Phone: [phone]   Fax:  [phone]  Physical Therapy Evaluation  Patient Details  Name: Sue Chase MRN: [id number] Date of Birth: 01-Aug-1955 Referring Provider (PT): [name]   Encounter Date: 10/15/2019   PT End of Session - 10/18/19 2331    Visit Number 1    Number of Visits 12    Date for PT Re-Evaluation 11/26/19    Authorization Type BCBS    PT Start Time 1605    PT Stop Time 1645    PT Time Calculation (min) 40 min    Activity Tolerance Patient limited by pain    Behavior During Therapy [facility] for tasks assessed/performed           Past Medical History:  Diagnosis Date  . Colon polyps   . Hyperlipidemia   . Rheumatic fever   . Seasonal allergies   . UTI (urinary tract infection)     Past Surgical History:  Procedure Laterality Date  . SALIVARY GLAND SURGERY     LATE 80S  . TUBAL LIGATION  1986    There were no vitals filed for this visit.    Subjective Assessment - 10/18/19 2311    Subjective Pt had new onset of pain in mid July. No incident to report. Severe pain. Did go on vacation, was riding in different car, and sleeping in different bed. In so much pain that she had to sit in wheelchair to get home in airport.    Limitations Sitting;Lifting;Standing;Walking;Writing;House hold activities    Diagnostic tests MRI: There is a sacral fracturethrough the S1-2 disc space and also extending into the sacral alabilaterally. These areas show enhancement however there is minimaledema. This is likely a subacute to chronic sacral insufficiencyfracture.    Patient Stated Goals decreased pain    Currently in Pain? Yes    Pain Score 8     Pain Location Back    Pain Orientation Left    Pain Descriptors / Indicators Aching    Pain Type Acute pain    Pain Radiating Towards L LE , down to foot    Pain Onset 1 to 4 weeks ago    Pain Frequency Intermittent     Aggravating Factors  Worst in AM, and worst at night(cant sleep) only 2 hrs at a time.              [facility] PT Assessment - 10/18/19 0001      Assessment   Medical Diagnosis LBP    Referring Provider (PT) [name]    Prior Therapy no      Balance Screen   Has the patient fallen in the past 6 months No      Prior Function   Level of Independence Independent      Cognition   Overall Cognitive Status Within Functional Limits for tasks assessed      ROM / Strength   AROM / PROM / Strength AROM;Strength      AROM   AROM Assessment Site Lumbar    Lumbar Flexion sign limitation    Lumbar Extension sign limitation    Lumbar - Right Side Bend mild limitation    Lumbar - Left Side Bend mild limiation      Strength   Overall Strength Comments L LE: Knee: 4/5, hip: 3+/5;   R LE: knee: 4+/5, Hip 4-/5     Strength Assessment Site --  Palpation   Palpation comment Mild/mod tenderness in lumbar spine and SI, much soreness in l glute       Special Tests   Other special tests + SLR,                       Objective measurements completed on examination: See above findings.       [facility] Adult PT Treatment/Exercise - 10/18/19 0001      Exercises   Exercises Lumbar      Lumbar Exercises: Stretches   Single Knee to Chest Stretch 2 reps;30 seconds    Pelvic Tilt 15 reps    Piriformis Stretch 2 reps;30 seconds    Piriformis Stretch Limitations supine fig 4;       Lumbar Exercises: Supine   Ab Set 10 reps    Bent Knee Raise 15 reps      Manual Therapy   Manual Therapy Soft tissue mobilization    Soft tissue mobilization L glute DTM                   PT Education - 10/18/19 2331    Education Details PT POC, Exam findings, HEP    Person(s) Educated Patient    Methods Explanation;Demonstration;Tactile cues;Verbal cues;Handout    Comprehension Verbalized understanding;Returned demonstration;Tactile cues required;Verbal cues required;Need  further instruction            PT Short Term Goals - 10/18/19 2315      PT SHORT TERM GOAL #1   Title Pt to be independent wtih initial HEp    Time 2    Period Weeks    Status New    Target Date 10/29/19      PT SHORT TERM GOAL #2   Title Pt to report decreased pain in back and LE to 0-4/10    Time 3    Period Weeks    Status New    Target Date 11/05/19             PT Long Term Goals - 10/18/19 2316      PT LONG TERM GOAL #1   Title Pt to be independent with final HEP    Time 6    Period Weeks    Status New    Target Date 11/26/19      PT LONG TERM GOAL #2   Title Pt to demo improved lumbar ROM to be [facility] without pain, to improve ability for iADLS.    Time 6    Period Weeks    Status New    Target Date 11/26/19      PT LONG TERM GOAL #3   Title Pt to report decreased pain in back and LE to 0-2/10 with activity    Time 6    Period Weeks    Status New    Target Date 11/26/19      PT LONG TERM GOAL #4   Title Pt to demo improved strenth of bil LEs to at least 4+/5    Time 6    Period Weeks    Status New    Target Date 11/26/19                  Plan - 10/18/19 2319    Clinical Impression Statement Pt presents with primary complaint of increased pain in lumbar region, and pain into L>R LE. Pt with signfiicant pain today, and is tearful for most of session. Pt with decresed lumbar ROM,  and strength loss in L>R hip and LE. She has decreased ability for full functional activiteis, and difficulty sleeping at this time. Pt to benefit from skilled PT to improve deficits and pain.    Personal Factors and Comorbidities Fitness    Examination-Activity Limitations Locomotion Level;Transfers;Bend;Sit;Sleep;Squat;Stairs;Lift;Stand    Examination-Participation Restrictions Meal Prep;Cleaning;Community Activity    Stability/Clinical Decision Making Evolving/Moderate complexity    Clinical Decision Making Moderate    Rehab Potential Good    PT Frequency 2x / week     PT Duration 6 weeks    PT Treatment/Interventions ADLs/Self Care Home Management;Canalith Repostioning;Cryotherapy;Electrical Stimulation;DME Instruction;Parrafin;Ultrasound;Traction;Moist Heat;Iontophoresis 4mg /ml Dexamethasone;Gait training;Stair training;Functional mobility training;Therapeutic activities;Therapeutic exercise;Balance training;Orthotic Fit/Training;Patient/family education;Neuromuscular re-education;Manual techniques;Taping;Dry needling;Passive range of motion;Spinal Manipulations;Joint Manipulations    Consulted and Agree with Plan of Care Patient           Patient will benefit from skilled therapeutic intervention in order to improve the following deficits and impairments:  Abnormal gait, Pain, Improper body mechanics, Decreased mobility, Increased muscle spasms, Decreased strength, Decreased range of motion, Decreased activity tolerance, Impaired flexibility  Visit Diagnosis: Acute bilateral low back pain with left-sided sciatica     Problem List Patient Active Problem List   Diagnosis Date Noted  . Lower extremity edema 06/29/2017  . Hyperlipidemia 06/29/2017  . Tobacco use 06/29/2017    [name] 10/18/2019, 11:32 PM  [facility] [address] [place], [place], [address] Phone: [phone]   Fax:  [phone]  Name: Sue Chase MRN: [id number] Date of Birth: May 13, 1955

## 2019-10-18 NOTE — Therapy (Signed)
[facility] [address] [place], [place], [address] Phone: [phone]   Fax:  [phone]  Physical Therapy Treatment  Patient Details  Name: Sue Chase MRN: [id number] Date of Birth: [birth year] Referring Provider (PT): [name]   Encounter Date: 10/17/2019   PT End of Session - 10/18/19 2336    Visit Number 2    Number of Visits 12    Date for PT Re-Evaluation 11/26/19    Authorization Type BCBS    PT Start Time 1345    PT Stop Time 1425    PT Time Calculation (min) 40 min    Activity Tolerance Patient limited by pain    Behavior During Therapy [facility] for tasks assessed/performed           Past Medical History:  Diagnosis Date  . Colon polyps   . Hyperlipidemia   . Rheumatic fever   . Seasonal allergies   . UTI (urinary tract infection)     Past Surgical History:  Procedure Laterality Date  . SALIVARY GLAND SURGERY     LATE 80S  . TUBAL LIGATION  1986    There were no vitals filed for this visit.   Subjective Assessment - 10/18/19 2336    Subjective Pt states less pain today. She was able to drive herself, and slept about 4 hrs at a time vs 2.    Currently in Pain? Yes    Pain Score 7     Pain Location Back    Pain Orientation Left    Pain Descriptors / Indicators Aching    Pain Type Acute pain    Pain Onset More than a month ago    Pain Frequency Intermittent                             [facility] Adult PT Treatment/Exercise - 10/18/19 2333      Exercises   Exercises Lumbar      Lumbar Exercises: Stretches   Single Knee to Chest Stretch 2 reps;30 seconds    Pelvic Tilt 20 reps    Piriformis Stretch 30 seconds;3 reps    Piriformis Stretch Limitations supine fig 4;       Lumbar Exercises: Supine   Ab Set 10 reps    Clam 20 reps    Clam Limitations RTB    Bent Knee Raise 20 reps      Lumbar Exercises: Sidelying   Hip Abduction 10 reps;Both      Manual Therapy   Manual  Therapy Soft tissue mobilization;Manual Traction    Soft tissue mobilization L glute DTM     Manual Traction Long leg distraction x 2 min bil;  for lumbar pump                     PT Short Term Goals - 10/18/19 2315      PT SHORT TERM GOAL #1   Title Pt to be independent wtih initial HEp    Time 2    Period Weeks    Status New    Target Date 10/29/19      PT SHORT TERM GOAL #2   Title Pt to report decreased pain in back and LE to 0-4/10    Time 3    Period Weeks    Status New    Target Date 11/05/19             PT Long  Term Goals - 10/18/19 2316      PT LONG TERM GOAL #1   Title Pt to be independent with final HEP    Time 6    Period Weeks    Status New    Target Date 11/26/19      PT LONG TERM GOAL #2   Title Pt to demo improved lumbar ROM to be [facility] without pain, to improve ability for iADLS.    Time 6    Period Weeks    Status New    Target Date 11/26/19      PT LONG TERM GOAL #3   Title Pt to report decreased pain in back and LE to 0-2/10 with activity    Time 6    Period Weeks    Status New    Target Date 11/26/19      PT LONG TERM GOAL #4   Title Pt to demo improved strenth of bil LEs to at least 4+/5    Time 6    Period Weeks    Status New    Target Date 11/26/19                 Plan - 10/18/19 2337    Clinical Impression Statement Pt with improved tolerance for activity and decreased pain from last session. Much soreness in L glute with DM. Pt had recent bone density, will see results, and awaiting CT scan.    Personal Factors and Comorbidities Fitness    Examination-Activity Limitations Locomotion Level;Transfers;Bend;Sit;Sleep;Squat;Stairs;Lift;Stand    Examination-Participation Restrictions Meal Prep;Cleaning;Community Activity    Stability/Clinical Decision Making Evolving/Moderate complexity    Rehab Potential Good    PT Frequency 2x / week    PT Duration 6 weeks    PT Treatment/Interventions ADLs/Self Care Home  Management;Canalith Repostioning;Cryotherapy;Electrical Stimulation;DME Instruction;Parrafin;Ultrasound;Traction;Moist Heat;Iontophoresis 4mg /ml Dexamethasone;Gait training;Stair training;Functional mobility training;Therapeutic activities;Therapeutic exercise;Balance training;Orthotic Fit/Training;Patient/family education;Neuromuscular re-education;Manual techniques;Taping;Dry needling;Passive range of motion;Spinal Manipulations;Joint Manipulations    Consulted and Agree with Plan of Care Patient           Patient will benefit from skilled therapeutic intervention in order to improve the following deficits and impairments:  Abnormal gait, Pain, Improper body mechanics, Decreased mobility, Increased muscle spasms, Decreased strength, Decreased range of motion, Decreased activity tolerance, Impaired flexibility  Visit Diagnosis: Acute bilateral low back pain with left-sided sciatica     Problem List Patient Active Problem List   Diagnosis Date Noted  . Lower extremity edema 06/29/2017  . Hyperlipidemia 06/29/2017  . Tobacco use 06/29/2017    [name], PT, DPT 11:39 PM  10/18/19    [facility] [place], [place], [address] Phone: [phone]   Fax:  [phone]  Name: Sue Chase MRN: [id number] Date of Birth: [birth year]

## 2019-10-21 ENCOUNTER — Ambulatory Visit (INDEPENDENT_AMBULATORY_CARE_PROVIDER_SITE_OTHER): Payer: BC Managed Care – PPO | Admitting: Physical Therapy

## 2019-10-21 ENCOUNTER — Other Ambulatory Visit: Payer: Self-pay

## 2019-10-21 ENCOUNTER — Encounter: Payer: Self-pay | Admitting: Physical Therapy

## 2019-10-21 DIAGNOSIS — M5442 Lumbago with sciatica, left side: Secondary | ICD-10-CM | POA: Diagnosis not present

## 2019-10-21 NOTE — Therapy (Signed)
[facility] [address] [place], [place], [address] Phone: [phone]   Fax:  [phone]  Physical Therapy Treatment  Patient Details  Name: Sue Chase MRN: [id number] Date of Birth: 1955-05-17 Referring Provider (PT): [name]   Encounter Date: 10/21/2019   PT End of Session - 10/21/19 1356    Visit Number 3    Number of Visits 12    Date for PT Re-Evaluation 11/26/19    Authorization Type BCBS    PT Start Time 1345    PT Stop Time 1429    PT Time Calculation (min) 44 min    Activity Tolerance Patient limited by pain    Behavior During Therapy [facility] for tasks assessed/performed           Past Medical History:  Diagnosis Date  . Colon polyps   . Hyperlipidemia   . Rheumatic fever   . Seasonal allergies   . UTI (urinary tract infection)     Past Surgical History:  Procedure Laterality Date  . SALIVARY GLAND SURGERY     LATE 80S  . TUBAL LIGATION  1986    There were no vitals filed for this visit.   Subjective Assessment - 10/21/19 1355    Subjective Pt states mild increase in pain. She is out of regular pain meds, today is first day of taking new meds, tramadol.Pt having CT scan on thursday. States weakness in LEs and poor confidence with walking and stairs due to weakness.    Currently in Pain? Yes    Pain Score 5     Pain Location Back    Pain Orientation Left    Pain Descriptors / Indicators Aching    Pain Type Acute pain    Pain Radiating Towards into L LE                             [facility] Adult PT Treatment/Exercise - 10/21/19 0001      Exercises   Exercises Lumbar      Lumbar Exercises: Stretches   Single Knee to Chest Stretch 2 reps;30 seconds    Pelvic Tilt 20 reps    Piriformis Stretch 30 seconds;3 reps    Piriformis Stretch Limitations supine fig 4;       Lumbar Exercises: Aerobic   Recumbent Bike L1 x 3 min (pain in L LE)       Lumbar Exercises: Standing    Other Standing Lumbar Exercises Stairs up/down 5 steps wiht 2 hand rails x 4;       Lumbar Exercises: Seated   Long Arc Quad on Chair Both;10 reps      Lumbar Exercises: Supine   Ab Set --    Clam 20 reps    Clam Limitations RTB    Bent Knee Raise 20 reps    Bridge 10 reps    Straight Leg Raise 10 reps    Straight Leg Raises Limitations bil      Lumbar Exercises: Sidelying   Hip Abduction 10 reps;Both      Manual Therapy   Manual Therapy Soft tissue mobilization;Manual Traction    Soft tissue mobilization L glute DTM     Manual Traction Long leg distraction x 2 min bil;  for lumbar pump                     PT Short Term Goals - 10/18/19 2315  PT SHORT TERM GOAL #1   Title Pt to be independent wtih initial HEp    Time 2    Period Weeks    Status New    Target Date 10/29/19      PT SHORT TERM GOAL #2   Title Pt to report decreased pain in back and LE to 0-4/10    Time 3    Period Weeks    Status New    Target Date 11/05/19             PT Long Term Goals - 10/18/19 2316      PT LONG TERM GOAL #1   Title Pt to be independent with final HEP    Time 6    Period Weeks    Status New    Target Date 11/26/19      PT LONG TERM GOAL #2   Title Pt to demo improved lumbar ROM to be [facility] without pain, to improve ability for iADLS.    Time 6    Period Weeks    Status New    Target Date 11/26/19      PT LONG TERM GOAL #3   Title Pt to report decreased pain in back and LE to 0-2/10 with activity    Time 6    Period Weeks    Status New    Target Date 11/26/19      PT LONG TERM GOAL #4   Title Pt to demo improved strenth of bil LEs to at least 4+/5    Time 6    Period Weeks    Status New    Target Date 11/26/19                 Plan - 10/21/19 1430    Clinical Impression Statement THer ex progressed for general strengthening with education for back pain. Pt wiht much weakness in LEs. Plan to progress as tolerated.    Personal Factors and  Comorbidities Fitness    Examination-Activity Limitations Locomotion Level;Transfers;Bend;Sit;Sleep;Squat;Stairs;Lift;Stand    Examination-Participation Restrictions Meal Prep;Cleaning;Community Activity    Stability/Clinical Decision Making Evolving/Moderate complexity    Rehab Potential Good    PT Frequency 2x / week    PT Duration 6 weeks    PT Treatment/Interventions ADLs/Self Care Home Management;Canalith Repostioning;Cryotherapy;Electrical Stimulation;DME Instruction;Parrafin;Ultrasound;Traction;Moist Heat;Iontophoresis 4mg /ml Dexamethasone;Gait training;Stair training;Functional mobility training;Therapeutic activities;Therapeutic exercise;Balance training;Orthotic Fit/Training;Patient/family education;Neuromuscular re-education;Manual techniques;Taping;Dry needling;Passive range of motion;Spinal Manipulations;Joint Manipulations    Consulted and Agree with Plan of Care Patient           Patient will benefit from skilled therapeutic intervention in order to improve the following deficits and impairments:  Abnormal gait, Pain, Improper body mechanics, Decreased mobility, Increased muscle spasms, Decreased strength, Decreased range of motion, Decreased activity tolerance, Impaired flexibility  Visit Diagnosis: Acute bilateral low back pain with left-sided sciatica     Problem List Patient Active Problem List   Diagnosis Date Noted  . Lower extremity edema 06/29/2017  . Hyperlipidemia 06/29/2017  . Tobacco use 06/29/2017    Sue Chase, PT, DPT 2:32 PM  10/21/19    [facility] [place], [place], [address] Phone: [phone]   Fax:  [phone]  Name: Sue Chase MRN: [id number] Date of Birth: Jul 24, 1955

## 2019-10-21 NOTE — Patient Instructions (Signed)
Access Code: [email or web address] URL: [email or web address] Date: 10/21/2019 Prepared by: [name]  Exercises Supine Posterior Pelvic Tilt - 2 x daily - 2 sets - 10 reps Supine Single Knee to Chest Stretch - 2 x daily - 3 reps - 30 hold Supine Figure 4 Piriformis Stretch with Leg Extension - 2 x daily - 3 reps - 30 hold Supine March - 2 x daily - 2 sets - 10 reps Sidelying Hip Abduction - 2 x daily - 2 sets - 10 reps Hooklying Clamshell with Resistance - 2 x daily - 2 sets - 10 reps Standing March with Counter Support - 2 x daily - 2 sets - 10 reps Sit to Stand - 2 x daily - 1 sets - 5-10 reps

## 2019-10-23 ENCOUNTER — Other Ambulatory Visit: Payer: Self-pay

## 2019-10-23 ENCOUNTER — Ambulatory Visit (INDEPENDENT_AMBULATORY_CARE_PROVIDER_SITE_OTHER): Payer: BC Managed Care – PPO | Admitting: Physical Therapy

## 2019-10-23 ENCOUNTER — Encounter: Payer: Self-pay | Admitting: Physical Therapy

## 2019-10-23 DIAGNOSIS — M5442 Lumbago with sciatica, left side: Secondary | ICD-10-CM | POA: Diagnosis not present

## 2019-10-23 NOTE — Therapy (Signed)
[facility] [address] [place], [place], [address] Phone: [phone]   Fax:  [phone]  Physical Therapy Treatment  Patient Details  Name: Brenly Trawick MRN: [id number] Date of Birth: [birth year] Referring Provider (PT): [name]   Encounter Date: 10/23/2019   PT End of Session - 10/23/19 1522    Visit Number 4    Number of Visits 12    Date for PT Re-Evaluation 11/26/19    Authorization Type BCBS    PT Start Time 1432    PT Stop Time 1510    PT Time Calculation (min) 38 min    Activity Tolerance Patient limited by pain    Behavior During Therapy [facility] for tasks assessed/performed           Past Medical History:  Diagnosis Date  . Colon polyps   . Hyperlipidemia   . Rheumatic fever   . Seasonal allergies   . UTI (urinary tract infection)     Past Surgical History:  Procedure Laterality Date  . SALIVARY GLAND SURGERY     LATE 80S  . TUBAL LIGATION  1986    There were no vitals filed for this visit.   Subjective Assessment - 10/23/19 1522    Subjective Pt states she thinks pain is slowly increasing this week. CT scheduled for tomorrow.    Currently in Pain? Yes    Pain Score 5     Pain Location Back    Pain Orientation Left    Pain Descriptors / Indicators Aching    Pain Type Acute pain    Pain Onset More than a month ago    Pain Frequency Intermittent                             [facility] Adult PT Treatment/Exercise - 10/23/19 1449      Exercises   Exercises Lumbar      Lumbar Exercises: Stretches   Active Hamstring Stretch 2 reps;30 seconds    Active Hamstring Stretch Limitations seated    Single Knee to Chest Stretch 2 reps;30 seconds    Pelvic Tilt 20 reps    Piriformis Stretch 30 seconds;3 reps    Piriformis Stretch Limitations seated      Lumbar Exercises: Aerobic   Recumbent Bike L1 x 5 min      Lumbar Exercises: Standing   Heel Raises Limitations unable    Other  Standing Lumbar Exercises Step ups 6 in x 15 with L LE. , 1 hr     Other Standing Lumbar Exercises March , HIp abd x 20  bil;       Lumbar Exercises: Seated   Long Arc Quad on Chair Both;20 reps    Sit to Stand 10 reps    Other Seated Lumbar Exercises Ankle pumps x 20;       Lumbar Exercises: Supine   Clam 20 reps    Clam Limitations RTB    Bent Knee Raise 20 reps    Bridge --    Straight Leg Raise --    Straight Leg Raises Limitations --      Lumbar Exercises: Sidelying   Hip Abduction --      Manual Therapy   Manual Therapy Soft tissue mobilization;Manual Traction    Soft tissue mobilization --    Manual Traction Long leg distraction x 2 min L;  for lumbar pump  PT Short Term Goals - 10/18/19 2315      PT SHORT TERM GOAL #1   Title Pt to be independent wtih initial HEp    Time 2    Period Weeks    Status New    Target Date 10/29/19      PT SHORT TERM GOAL #2   Title Pt to report decreased pain in back and LE to 0-4/10    Time 3    Period Weeks    Status New    Target Date 11/05/19             PT Long Term Goals - 10/18/19 2316      PT LONG TERM GOAL #1   Title Pt to be independent with final HEP    Time 6    Period Weeks    Status New    Target Date 11/26/19      PT LONG TERM GOAL #2   Title Pt to demo improved lumbar ROM to be [facility] without pain, to improve ability for iADLS.    Time 6    Period Weeks    Status New    Target Date 11/26/19      PT LONG TERM GOAL #3   Title Pt to report decreased pain in back and LE to 0-2/10 with activity    Time 6    Period Weeks    Status New    Target Date 11/26/19      PT LONG TERM GOAL #4   Title Pt to demo improved strenth of bil LEs to at least 4+/5    Time 6    Period Weeks    Status New    Target Date 11/26/19                 Plan - 10/23/19 1523    Clinical Impression Statement Focus on LE ther ex today, strengthening and strength for core. HEP updated. Pt  asking about foot drop,has full active DF, no signs of foot drop, but unable to raise up on toes for Heel raise at counter. Plan to progress as tolerated, after results of CT seen.    Personal Factors and Comorbidities Fitness    Examination-Activity Limitations Locomotion Level;Transfers;Bend;Sit;Sleep;Squat;Stairs;Lift;Stand    Examination-Participation Restrictions Meal Prep;Cleaning;Community Activity    Stability/Clinical Decision Making Evolving/Moderate complexity    Rehab Potential Good    PT Frequency 2x / week    PT Duration 6 weeks    PT Treatment/Interventions ADLs/Self Care Home Management;Canalith Repostioning;Cryotherapy;Electrical Stimulation;DME Instruction;Parrafin;Ultrasound;Traction;Moist Heat;Iontophoresis 4mg /ml Dexamethasone;Gait training;Stair training;Functional mobility training;Therapeutic activities;Therapeutic exercise;Balance training;Orthotic Fit/Training;Patient/family education;Neuromuscular re-education;Manual techniques;Taping;Dry needling;Passive range of motion;Spinal Manipulations;Joint Manipulations    Consulted and Agree with Plan of Care Patient           Patient will benefit from skilled therapeutic intervention in order to improve the following deficits and impairments:  Abnormal gait, Pain, Improper body mechanics, Decreased mobility, Increased muscle spasms, Decreased strength, Decreased range of motion, Decreased activity tolerance, Impaired flexibility  Visit Diagnosis: Acute bilateral low back pain with left-sided sciatica     Problem List Patient Active Problem List   Diagnosis Date Noted  . Lower extremity edema 06/29/2017  . Hyperlipidemia 06/29/2017  . Tobacco use 06/29/2017     [name], PT, DPT 3:25 PM  10/23/19   [facility] [place], [place], [address] Phone: [phone]   Fax:  [phone]  Name: Asalee Barrette MRN: [id number] Date of Birth:  03/08/1955   

## 2019-10-24 ENCOUNTER — Ambulatory Visit
Admission: RE | Admit: 2019-10-24 | Discharge: 2019-10-24 | Disposition: A | Discharge status: Home / Self Care | Payer: BC Managed Care – PPO | Source: Ambulatory Visit | Attending: Neurosurgery | Admitting: Neurosurgery

## 2019-10-24 DIAGNOSIS — K5730 Diverticulosis of large intestine without perforation or abscess without bleeding: Secondary | ICD-10-CM | POA: Diagnosis not present

## 2019-10-24 DIAGNOSIS — S3219XA Other fracture of sacrum, initial encounter for closed fracture: Secondary | ICD-10-CM | POA: Diagnosis not present

## 2019-10-24 DIAGNOSIS — K429 Umbilical hernia without obstruction or gangrene: Secondary | ICD-10-CM | POA: Diagnosis not present

## 2019-10-24 DIAGNOSIS — I7090 Unspecified atherosclerosis: Secondary | ICD-10-CM | POA: Diagnosis not present

## 2019-10-24 DIAGNOSIS — M8448XA Pathological fracture, other site, initial encounter for fracture: Secondary | ICD-10-CM

## 2019-10-24 IMAGING — CT CT PELVIS W/O CM
2 series · 13 of 32 positions shown, 19 images · non-contrast
Comparison: [DATE] lumbar spine MRI.

CLINICAL DATA: Fracture, pain.

EXAM:
CT PELVIS WITHOUT CONTRAST
TECHNIQUE: Multidetector CT imaging of the pelvis was performed following the
standard protocol without intravenous contrast.

[Series 2: soft tissue pelvis without · axial · non-contrast · 0.66mm/px · z∈[-271,-81]mm · 10 of 48 slices shown, 16 images]
[im 5/48  soft-tissue]
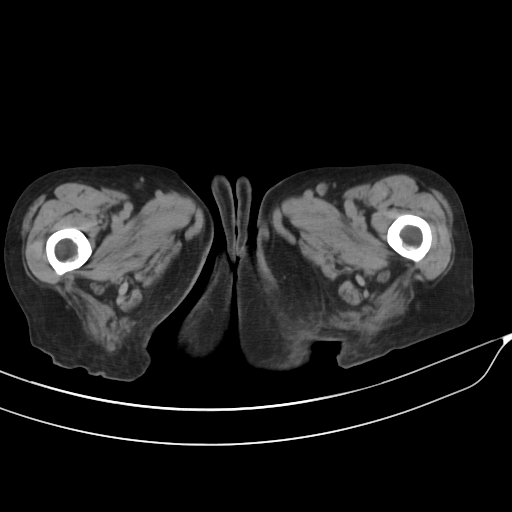
[im 5/48  bone]
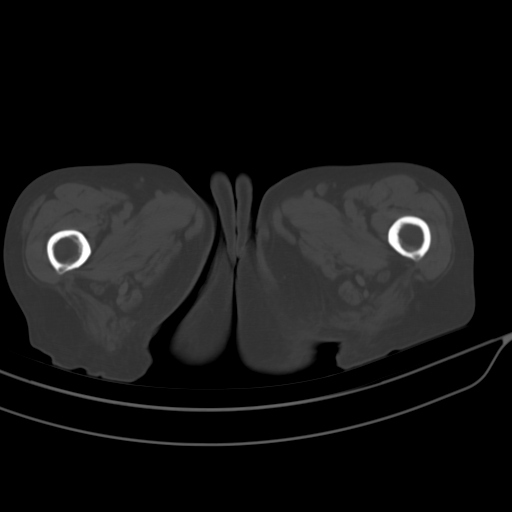
[im 9/48  soft-tissue]
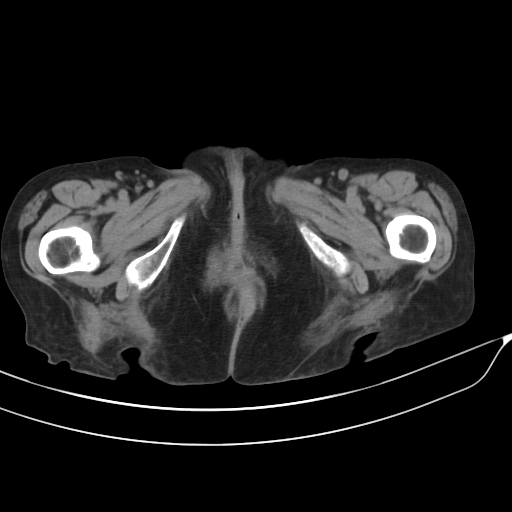
[im 13/48  soft-tissue]
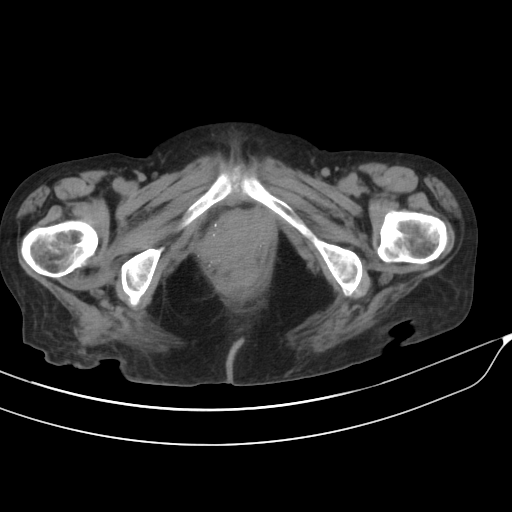
[im 18/48  soft-tissue]
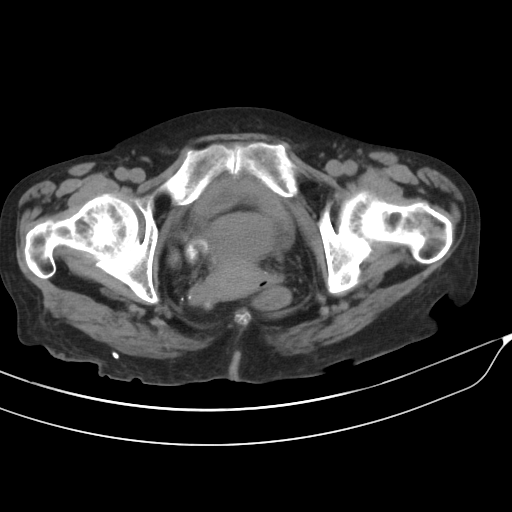
[im 22/48  soft-tissue]
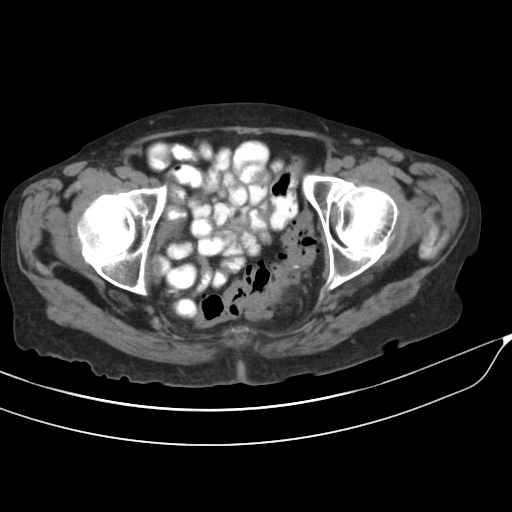
[im 26/48  soft-tissue]
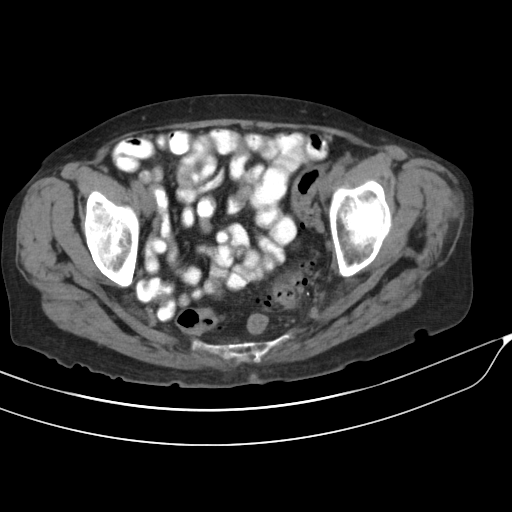
[im 30/48  soft-tissue]
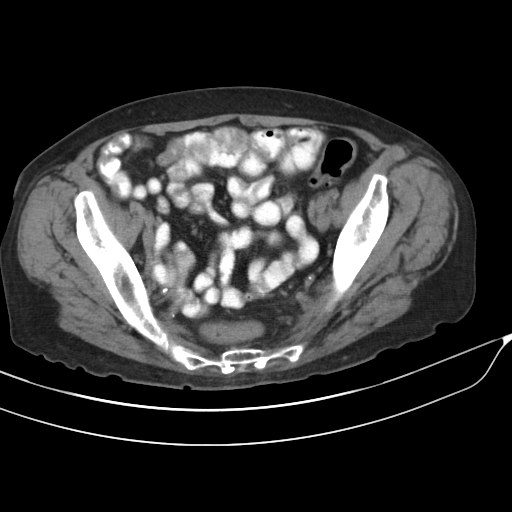
[im 30/48  lung]
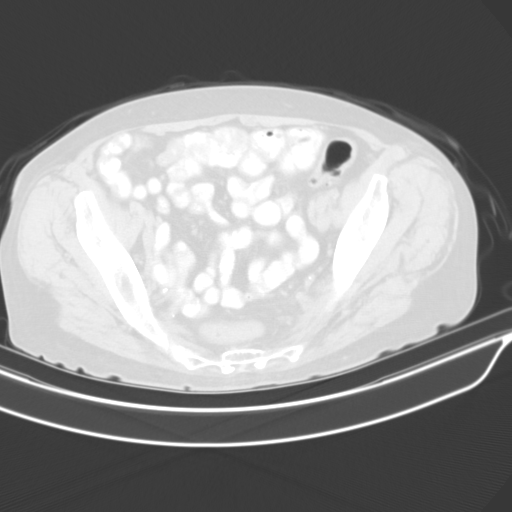
[im 35/48  soft-tissue]
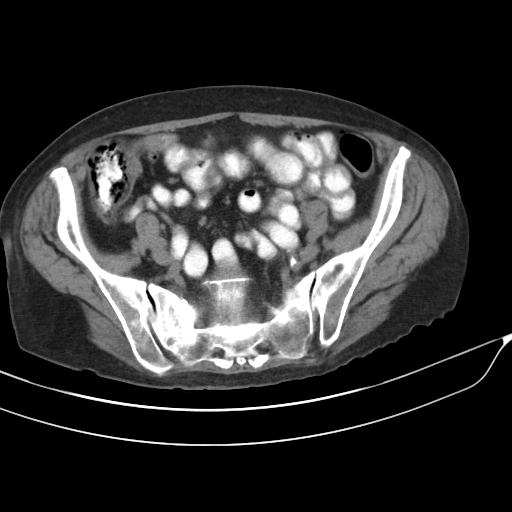
[im 35/48  lung]
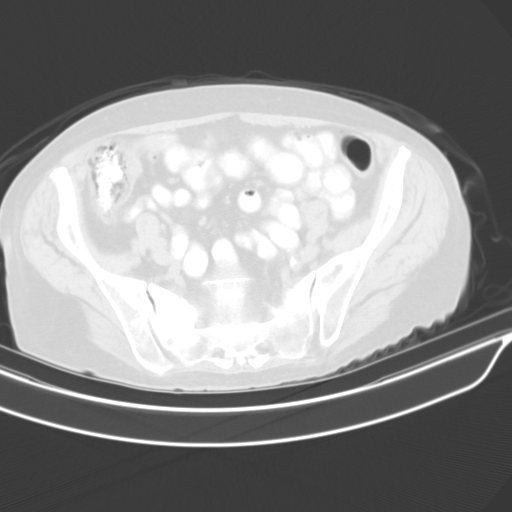
[im 39/48  soft-tissue]
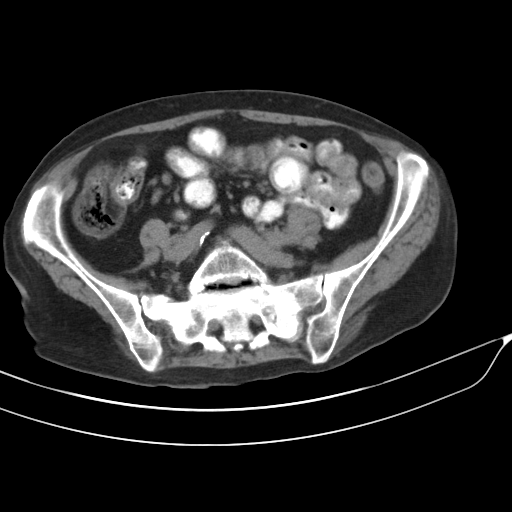
[im 39/48  lung]
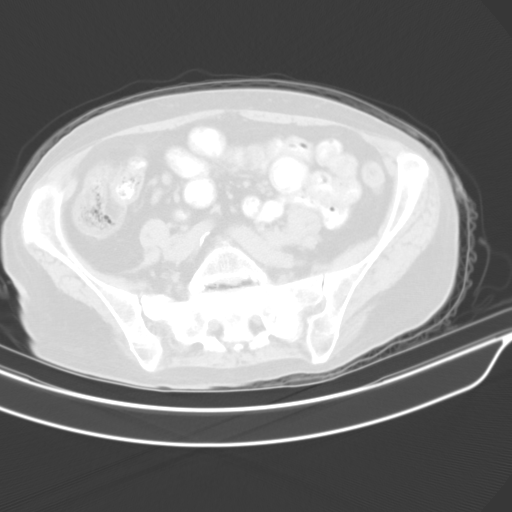
[im 39/48  bone]
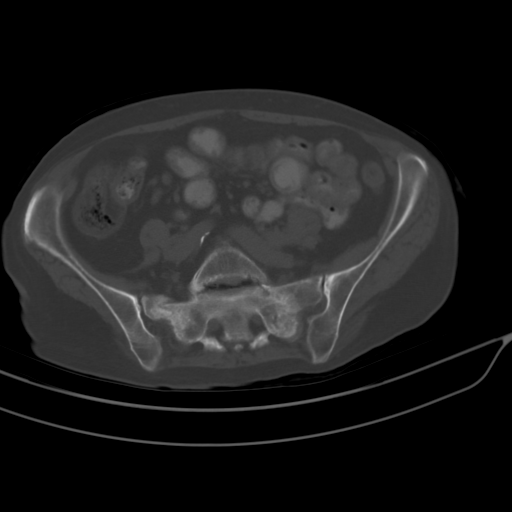
[im 43/48  soft-tissue]
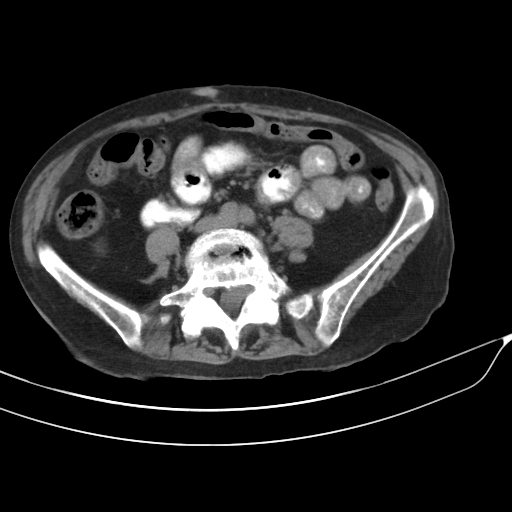
[im 43/48  lung]
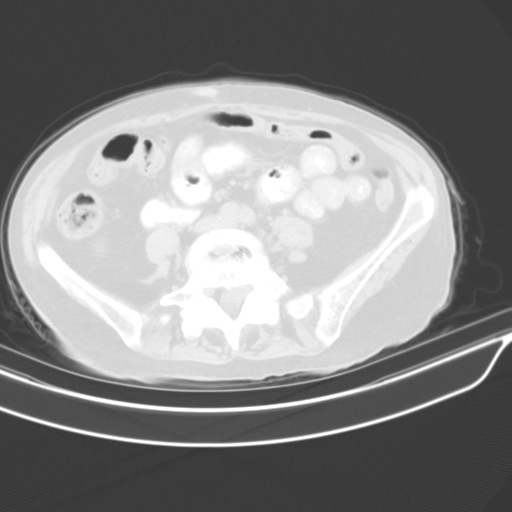

[Series 3: bone · axial · 0.68mm/px · z∈[-269,-221]mm · 3 of 80 slices shown]
[im 9/80  bone]
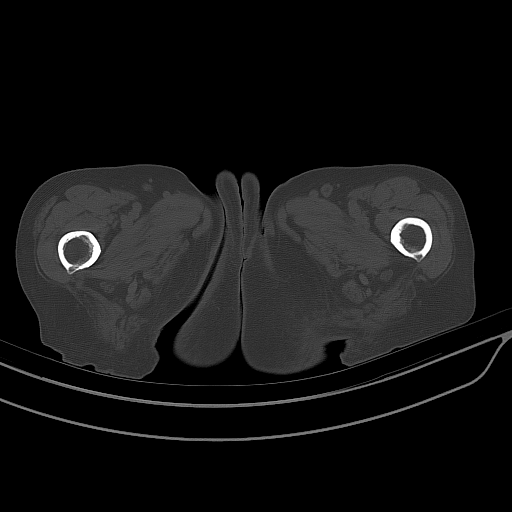
[im 17/80  bone]
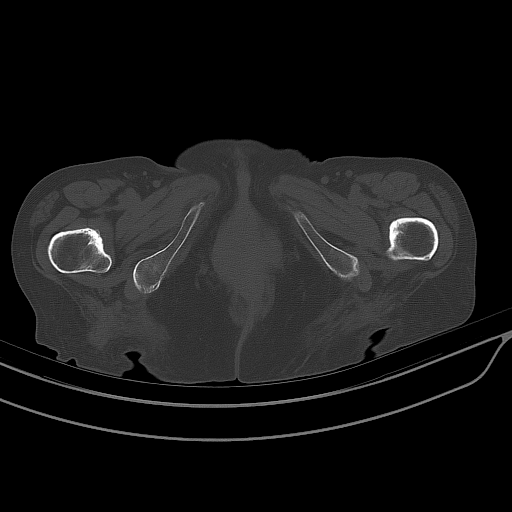
[im 25/80  bone]
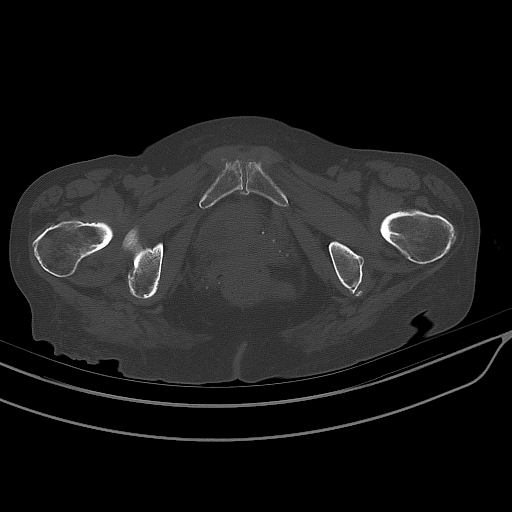

[13 of 32 positions shown; findings below may reference images not displayed]

FINDINGS: Urinary Tract: No abnormality visualized. Decompressed urinary
bladder.

Bowel: Sigmoid diverticulosis. No bowel dilatation or inflammation.

Vascular/Lymphatic: Mild atherosclerotic calcifications involving
the abdominal aorta and its branch vessels. No adenopathy.

Reproductive:  No mass or other significant abnormality

Other:  Small fat containing umbilical hernia.

Musculoskeletal: Bilateral sacral ala insufficiency fractures. The
right fracture demonstrates mild depression along the superior
aspect ([DATE]). Partially imaged lumbar spondylosis. Bilateral SI
joints, hips and pubic symphysis remain approximated.
IMPRESSION: Likely subacute to chronic bilateral sacral ala insufficiency
fractures.

The right insufficiency fracture demonstrates minimal to mild
depression of the superior sacral ala.

Sigmoid diverticulosis without diverticulitis.

## 2019-10-29 ENCOUNTER — Encounter: Payer: BC Managed Care – PPO | Admitting: Physical Therapy

## 2019-10-30 ENCOUNTER — Encounter: Payer: Self-pay | Admitting: Physical Therapy

## 2019-10-30 ENCOUNTER — Other Ambulatory Visit: Payer: Self-pay

## 2019-10-30 ENCOUNTER — Ambulatory Visit (INDEPENDENT_AMBULATORY_CARE_PROVIDER_SITE_OTHER): Payer: BC Managed Care – PPO | Admitting: Physical Therapy

## 2019-10-30 DIAGNOSIS — M5442 Lumbago with sciatica, left side: Secondary | ICD-10-CM

## 2019-10-31 ENCOUNTER — Ambulatory Visit (INDEPENDENT_AMBULATORY_CARE_PROVIDER_SITE_OTHER): Payer: BC Managed Care – PPO | Admitting: Physical Therapy

## 2019-10-31 ENCOUNTER — Encounter: Payer: Self-pay | Admitting: Physical Therapy

## 2019-10-31 DIAGNOSIS — M5442 Lumbago with sciatica, left side: Secondary | ICD-10-CM

## 2019-11-01 NOTE — Therapy (Signed)
[facility] [address] [place], [place], [address] Phone: [phone]   Fax:  [phone]  Physical Therapy Treatment  Patient Details  Name: Genecis Veley MRN: [id number] Date of Birth: [birth year] Referring Provider (PT): [name]   Encounter Date: 10/30/2019   PT End of Session - 11/01/19 1312    Visit Number 5    Number of Visits 12    Date for PT Re-Evaluation 11/26/19    Authorization Type BCBS    PT Start Time 1435    PT Stop Time 1513    PT Time Calculation (min) 38 min    Activity Tolerance Patient tolerated treatment well    Behavior During Therapy [facility] for tasks assessed/performed           Past Medical History:  Diagnosis Date  . Colon polyps   . Hyperlipidemia   . Rheumatic fever   . Seasonal allergies   . UTI (urinary tract infection)     Past Surgical History:  Procedure Laterality Date  . SALIVARY GLAND SURGERY     LATE 80S  . TUBAL LIGATION  1986    There were no vitals filed for this visit.   Subjective Assessment - 11/01/19 1312    Subjective Pt had CT scan. See results below. Pt states mild improvments in pain. L Leg pain still present, with tingling into toes.    Pain Onset More than a month ago    Pain Onset More than a month ago                             [facility] Adult PT Treatment/Exercise - 11/01/19 1315      Exercises   Exercises Lumbar      Lumbar Exercises: Stretches   Single Knee to Chest Stretch 2 reps;30 seconds    Pelvic Tilt 20 reps    Piriformis Stretch 30 seconds;3 reps    Piriformis Stretch Limitations seated      Lumbar Exercises: Aerobic   Recumbent Bike L1 x 5 min      Lumbar Exercises: Standing   Other Standing Lumbar Exercises March , HIp abd x 20  bil;       Lumbar Exercises: Seated   Long Arc Quad on Chair Both;20 reps    LAQ on Chair Weights (lbs) 2    Sit to Stand 10 reps    Sit to Stand Limitations with R LE on AirEx       Lumbar Exercises: Supine   Clam 20 reps    Clam Limitations RTB    Bent Knee Raise 20 reps    Straight Leg Raise 10 reps    Straight Leg Raises Limitations no pain      Lumbar Exercises: Sidelying   Hip Abduction 15 reps;Both      Manual Therapy   Manual Therapy Soft tissue mobilization;Manual Traction    Soft tissue mobilization STM/roller to L glute                     PT Short Term Goals - 10/18/19 2315      PT SHORT TERM GOAL #1   Title Pt to be independent wtih initial HEp    Time 2    Period Weeks    Status New    Target Date 10/29/19      PT SHORT TERM GOAL #2   Title Pt to report decreased pain in  back and LE to 0-4/10    Time 3    Period Weeks    Status New    Target Date 11/05/19             PT Long Term Goals - 10/18/19 2316      PT LONG TERM GOAL #1   Title Pt to be independent with final HEP    Time 6    Period Weeks    Status New    Target Date 11/26/19      PT LONG TERM GOAL #2   Title Pt to demo improved lumbar ROM to be [facility] without pain, to improve ability for iADLS.    Time 6    Period Weeks    Status New    Target Date 11/26/19      PT LONG TERM GOAL #3   Title Pt to report decreased pain in back and LE to 0-2/10 with activity    Time 6    Period Weeks    Status New    Target Date 11/26/19      PT LONG TERM GOAL #4   Title Pt to demo improved strenth of bil LEs to at least 4+/5    Time 6    Period Weeks    Status New    Target Date 11/26/19                 Plan - 11/01/19 1314    Clinical Impression Statement Pt states mild pain improvments. Has been able to strengthen and perform ther ex, with minimal increase in pain. Concern for weakness in LE.    Personal Factors and Comorbidities Fitness    Examination-Activity Limitations Locomotion Level;Transfers;Bend;Sit;Sleep;Squat;Stairs;Lift;Stand    Examination-Participation Restrictions Meal Prep;Cleaning;Community Activity    Stability/Clinical Decision Making  Evolving/Moderate complexity    Rehab Potential Good    PT Frequency 2x / week    PT Duration 6 weeks    PT Treatment/Interventions ADLs/Self Care Home Management;Canalith Repostioning;Cryotherapy;Electrical Stimulation;DME Instruction;Parrafin;Ultrasound;Traction;Moist Heat;Iontophoresis 4mg /ml Dexamethasone;Gait training;Stair training;Functional mobility training;Therapeutic activities;Therapeutic exercise;Balance training;Orthotic Fit/Training;Patient/family education;Neuromuscular re-education;Manual techniques;Taping;Dry needling;Passive range of motion;Spinal Manipulations;Joint Manipulations    Consulted and Agree with Plan of Care Patient           Patient will benefit from skilled therapeutic intervention in order to improve the following deficits and impairments:  Abnormal gait, Pain, Improper body mechanics, Decreased mobility, Increased muscle spasms, Decreased strength, Decreased range of motion, Decreased activity tolerance, Impaired flexibility  Visit Diagnosis: Acute bilateral low back pain with left-sided sciatica     Problem List Patient Active Problem List   Diagnosis Date Noted  . Lower extremity edema 06/29/2017  . Hyperlipidemia 06/29/2017  . Tobacco use 06/29/2017    [name], PT, DPT 1:17 PM  11/01/19    [place], [place], [address] Phone: [phone]   Fax:  [phone]  Name: Tuwanna Krausz MRN: [id number] Date of Birth: 12-19-1955

## 2019-11-01 NOTE — Therapy (Signed)
[facility] [address] [place], [place], [address] Phone: [phone]   Fax:  [phone]  Physical Therapy Treatment  Patient Details  Name: Kenishia Plack MRN: [id number] Date of Birth: 1955/05/16 Referring Provider (PT): [name]   Encounter Date: 10/31/2019   PT End of Session - 10/31/19 1112    Visit Number 6    Number of Visits 12    Date for PT Re-Evaluation 11/26/19    Authorization Type BCBS    PT Start Time 1102    PT Stop Time 1145    PT Time Calculation (min) 43 min    Activity Tolerance Patient tolerated treatment well    Behavior During Therapy [facility] for tasks assessed/performed           Past Medical History:  Diagnosis Date  . Colon polyps   . Hyperlipidemia   . Rheumatic fever   . Seasonal allergies   . UTI (urinary tract infection)     Past Surgical History:  Procedure Laterality Date  . SALIVARY GLAND SURGERY     LATE 80S  . TUBAL LIGATION  1986    There were no vitals filed for this visit.   Subjective Assessment - 10/31/19 1110    Subjective Pt states she is doing better, but still has pain 4-5/10 and pain into Calf. Noted antalgic gait due to leg pain/weakness.    Currently in Pain? Yes    Pain Score 4     Pain Location Back    Pain Orientation Left    Pain Type Acute pain    Pain Onset More than a month ago    Pain Frequency Intermittent    Pain Score 5    Pain Location Leg    Pain Orientation Left    Pain Descriptors / Indicators Aching;Shooting    Pain Type Acute pain    Pain Onset More than a month ago    Pain Frequency Intermittent                             [facility] Adult PT Treatment/Exercise - 11/01/19 0001      Exercises   Exercises Lumbar      Lumbar Exercises: Stretches   Single Knee to Chest Stretch 2 reps;30 seconds    Piriformis Stretch 30 seconds;3 reps    Piriformis Stretch Limitations seated      Lumbar Exercises: Aerobic   Recumbent  Bike L1 x 5 min      Lumbar Exercises: Standing   Other Standing Lumbar Exercises March , HIp abd x 20  bil; L/R and A/P weight shifts x 20 ea;        Lumbar Exercises: Seated   Long Arc Quad on Chair Both;20 reps    LAQ on Chair Weights (lbs) 2    Sit to Stand 10 reps    Other Seated Lumbar Exercises Ankle PF with RTB x 20;  seated HR x 20;       Lumbar Exercises: Supine   Clam 20 reps    Clam Limitations RTB    Bent Knee Raise 20 reps    Bent Knee Raise Limitations RTB    Bridge 10 reps    Straight Leg Raise 10 reps    Straight Leg Raises Limitations no pain    Other Supine Lumbar Exercises hip add iso x 20       Lumbar Exercises: Sidelying   Hip Abduction  15 reps;Both      Manual Therapy   Manual Therapy Soft tissue mobilization;Manual Traction                    PT Short Term Goals - 10/18/19 2315      PT SHORT TERM GOAL #1   Title Pt to be independent wtih initial HEp    Time 2    Period Weeks    Status New    Target Date 10/29/19      PT SHORT TERM GOAL #2   Title Pt to report decreased pain in back and LE to 0-4/10    Time 3    Period Weeks    Status New    Target Date 11/05/19             PT Long Term Goals - 10/18/19 2316      PT LONG TERM GOAL #1   Title Pt to be independent with final HEP    Time 6    Period Weeks    Status New    Target Date 11/26/19      PT LONG TERM GOAL #2   Title Pt to demo improved lumbar ROM to be [facility] without pain, to improve ability for iADLS.    Time 6    Period Weeks    Status New    Target Date 11/26/19      PT LONG TERM GOAL #3   Title Pt to report decreased pain in back and LE to 0-2/10 with activity    Time 6    Period Weeks    Status New    Target Date 11/26/19      PT LONG TERM GOAL #4   Title Pt to demo improved strenth of bil LEs to at least 4+/5    Time 6    Period Weeks    Status New    Target Date 11/26/19                 Plan - 11/01/19 1233    Clinical Impression  Statement Pt states some pain relief, but does have weakness in leg, and inability for standing HR/plantar flexion due to weakness. Recommended that pt f/u with MD after having recent CT scan for results. Focus on ther ex for strenghtening today.    Personal Factors and Comorbidities Fitness    Examination-Activity Limitations Locomotion Level;Transfers;Bend;Sit;Sleep;Squat;Stairs;Lift;Stand    Examination-Participation Restrictions Meal Prep;Cleaning;Community Activity    Stability/Clinical Decision Making Evolving/Moderate complexity    Rehab Potential Good    PT Frequency 2x / week    PT Duration 6 weeks    PT Treatment/Interventions ADLs/Self Care Home Management;Canalith Repostioning;Cryotherapy;Electrical Stimulation;DME Instruction;Parrafin;Ultrasound;Traction;Moist Heat;Iontophoresis 4mg /ml Dexamethasone;Gait training;Stair training;Functional mobility training;Therapeutic activities;Therapeutic exercise;Balance training;Orthotic Fit/Training;Patient/family education;Neuromuscular re-education;Manual techniques;Taping;Dry needling;Passive range of motion;Spinal Manipulations;Joint Manipulations    Consulted and Agree with Plan of Care Patient           Patient will benefit from skilled therapeutic intervention in order to improve the following deficits and impairments:  Abnormal gait, Pain, Improper body mechanics, Decreased mobility, Increased muscle spasms, Decreased strength, Decreased range of motion, Decreased activity tolerance, Impaired flexibility  Visit Diagnosis: Acute bilateral low back pain with left-sided sciatica     Problem List Patient Active Problem List   Diagnosis Date Noted  . Lower extremity edema 06/29/2017  . Hyperlipidemia 06/29/2017  . Tobacco use 06/29/2017   [name], PT, DPT 12:36 PM  11/01/19    [facility] [place]  [place], [place], [address] Phone: [phone]   Fax:   [phone]  Name: Sueann Brownley MRN: [id number] Date of Birth: 1955-07-22

## 2019-11-04 ENCOUNTER — Ambulatory Visit (INDEPENDENT_AMBULATORY_CARE_PROVIDER_SITE_OTHER): Payer: BC Managed Care – PPO | Admitting: Physical Therapy

## 2019-11-04 ENCOUNTER — Encounter: Payer: Self-pay | Admitting: Physical Therapy

## 2019-11-04 ENCOUNTER — Other Ambulatory Visit: Payer: Self-pay

## 2019-11-04 DIAGNOSIS — M5442 Lumbago with sciatica, left side: Secondary | ICD-10-CM

## 2019-11-04 NOTE — Therapy (Signed)
[facility] [address] [place], [place], [address] Phone: [phone]   Fax:  [phone]  Physical Therapy Treatment  Patient Details  Name: Sue Chase MRN: [id number] Date of Birth: [birth year] Referring Provider (PT): [name]   Encounter Date: 11/04/2019   PT End of Session - 11/04/19 1610    Visit Number 7    Number of Visits 12    Date for PT Re-Evaluation 11/26/19    Authorization Type BCBS    PT Start Time 1603    PT Stop Time 1638    PT Time Calculation (min) 35 min    Activity Tolerance Patient tolerated treatment well    Behavior During Therapy [facility] for tasks assessed/performed           Past Medical History:  Diagnosis Date  . Colon polyps   . Hyperlipidemia   . Rheumatic fever   . Seasonal allergies   . UTI (urinary tract infection)     Past Surgical History:  Procedure Laterality Date  . SALIVARY GLAND SURGERY     LATE 80S  . TUBAL LIGATION  1986    There were no vitals filed for this visit.   Subjective Assessment - 11/04/19 1609    Subjective PT states continued pain in calf and foot in L.    Currently in Pain? Yes    Pain Score 4     Pain Location Leg    Pain Orientation Left    Pain Descriptors / Indicators Aching    Pain Type Acute pain    Pain Onset More than a month ago    Pain Frequency Intermittent                             [facility] Adult PT Treatment/Exercise - 11/04/19 1610      Exercises   Exercises Lumbar      Lumbar Exercises: Stretches   Single Knee to Chest Stretch 2 reps;30 seconds    Pelvic Tilt 20 reps    Piriformis Stretch 30 seconds;3 reps    Piriformis Stretch Limitations seated      Lumbar Exercises: Aerobic   Recumbent Bike L1 x 5 min      Lumbar Exercises: Standing   Other Standing Lumbar Exercises March , HIp abd x 20  bil;       Lumbar Exercises: Seated   Long Arc Quad on Chair Both;20 reps    LAQ on Chair Weights (lbs) 2     Sit to Stand 10 reps    Sit to Stand Limitations with R LE on AirEx      Lumbar Exercises: Supine   Clam 20 reps    Clam Limitations RTB    Bent Knee Raise --    Straight Leg Raise 10 reps    Straight Leg Raises Limitations --      Lumbar Exercises: Sidelying   Hip Abduction 15 reps;Both      Manual Therapy   Manual Therapy Soft tissue mobilization;Manual Traction    Soft tissue mobilization STM/DTM  to L glute                     PT Short Term Goals - 10/18/19 2315      PT SHORT TERM GOAL #1   Title Pt to be independent wtih initial HEp    Time 2    Period Weeks    Status New  Target Date 10/29/19      PT SHORT TERM GOAL #2   Title Pt to report decreased pain in back and LE to 0-4/10    Time 3    Period Weeks    Status New    Target Date 11/05/19             PT Long Term Goals - 10/18/19 2316      PT LONG TERM GOAL #1   Title Pt to be independent with final HEP    Time 6    Period Weeks    Status New    Target Date 11/26/19      PT LONG TERM GOAL #2   Title Pt to demo improved lumbar ROM to be [facility] without pain, to improve ability for iADLS.    Time 6    Period Weeks    Status New    Target Date 11/26/19      PT LONG TERM GOAL #3   Title Pt to report decreased pain in back and LE to 0-2/10 with activity    Time 6    Period Weeks    Status New    Target Date 11/26/19      PT LONG TERM GOAL #4   Title Pt to demo improved strenth of bil LEs to at least 4+/5    Time 6    Period Weeks    Status New    Target Date 11/26/19                 Plan - 11/04/19 1642    Clinical Impression Statement Pt with much soreness in Glute with DTm today. Able to perform ther ex with little pain, but still reporting pain in LE, and has weakness in L PF.    Personal Factors and Comorbidities Fitness    Examination-Activity Limitations Locomotion Level;Transfers;Bend;Sit;Sleep;Squat;Stairs;Lift;Stand    Examination-Participation Restrictions  Meal Prep;Cleaning;Community Activity    Stability/Clinical Decision Making Evolving/Moderate complexity    Rehab Potential Good    PT Frequency 2x / week    PT Duration 6 weeks    PT Treatment/Interventions ADLs/Self Care Home Management;Canalith Repostioning;Cryotherapy;Electrical Stimulation;DME Instruction;Parrafin;Ultrasound;Traction;Moist Heat;Iontophoresis 4mg /ml Dexamethasone;Gait training;Stair training;Functional mobility training;Therapeutic activities;Therapeutic exercise;Balance training;Orthotic Fit/Training;Patient/family education;Neuromuscular re-education;Manual techniques;Taping;Dry needling;Passive range of motion;Spinal Manipulations;Joint Manipulations    Consulted and Agree with Plan of Care Patient           Patient will benefit from skilled therapeutic intervention in order to improve the following deficits and impairments:  Abnormal gait, Pain, Improper body mechanics, Decreased mobility, Increased muscle spasms, Decreased strength, Decreased range of motion, Decreased activity tolerance, Impaired flexibility  Visit Diagnosis: Acute bilateral low back pain with left-sided sciatica     Problem List Patient Active Problem List   Diagnosis Date Noted  . Lower extremity edema 06/29/2017  . Hyperlipidemia 06/29/2017  . Tobacco use 06/29/2017    [name], PT, DPT 4:43 PM  11/04/19    [place], [place], [address] Phone: [phone]   Fax:  [phone]  Name: Cici Rodriges MRN: [id number] Date of Birth: [birth year]

## 2019-11-06 ENCOUNTER — Ambulatory Visit (INDEPENDENT_AMBULATORY_CARE_PROVIDER_SITE_OTHER): Payer: BC Managed Care – PPO | Admitting: Physical Therapy

## 2019-11-06 ENCOUNTER — Other Ambulatory Visit: Payer: Self-pay

## 2019-11-06 ENCOUNTER — Encounter: Payer: Self-pay | Admitting: Physical Therapy

## 2019-11-06 DIAGNOSIS — M5442 Lumbago with sciatica, left side: Secondary | ICD-10-CM

## 2019-11-06 NOTE — Therapy (Addendum)
[place] [address] [place], [place], [address] Phone: [phone]   Fax:  [phone]  Physical Therapy Treatment  Patient Details  Name: Sue Chase MRN: [id number] Date of Birth: 05/11/1955 Referring Provider (PT): [name]   Encounter Date: 11/06/2019   PT End of Session - 11/06/19 1528    Visit Number 8    Number of Visits 12    Date for PT Re-Evaluation 11/26/19    Authorization Type BCBS    PT Start Time 3295    PT Stop Time 1553    PT Time Calculation (min) 38 min    Activity Tolerance Patient tolerated treatment well    Behavior During Therapy [facility] for tasks assessed/performed           Past Medical History:  Diagnosis Date  . Colon polyps   . Hyperlipidemia   . Rheumatic fever   . Seasonal allergies   . UTI (urinary tract infection)     Past Surgical History:  Procedure Laterality Date  . SALIVARY GLAND SURGERY     LATE 80S  . TUBAL LIGATION  1986    There were no vitals filed for this visit.   Subjective Assessment - 11/06/19 1526    Subjective Pt notes slight increase in pain, has not had any pain meds in3 days. Also notes tingling into R foot, which she has not had in a while.    Currently in Pain? Yes    Pain Score 6     Pain Location Leg    Pain Orientation Left    Pain Descriptors / Indicators Aching    Pain Type Acute pain    Pain Onset More than a month ago    Pain Frequency Intermittent    Pain Score 4    Pain Location Back    Pain Orientation Right;Left    Pain Descriptors / Indicators Aching                             [facility] Adult PT Treatment/Exercise - 11/06/19 1534      Exercises   Exercises Lumbar      Lumbar Exercises: Stretches   Single Knee to Chest Stretch 2 reps;30 seconds    Pelvic Tilt 20 reps    Piriformis Stretch 30 seconds;3 reps    Piriformis Stretch Limitations supine      Lumbar Exercises: Aerobic   Recumbent Bike unable/ pain  in legs      Lumbar Exercises: Standing   Other Standing Lumbar Exercises March , HIp abd x 20  bil;       Lumbar Exercises: Seated   Long Arc Quad on Chair Both;20 reps    LAQ on Chair Weights (lbs) 2    Sit to Stand --    Sit to Stand Limitations --      Lumbar Exercises: Supine   Clam 20 reps    Clam Limitations RTB    Bent Knee Raise 20 reps    Bent Knee Raise Limitations RTB    Straight Leg Raise --      Lumbar Exercises: Sidelying   Hip Abduction 15 reps;Both      Manual Therapy   Manual Therapy Soft tissue mobilization;Manual Traction    Soft tissue mobilization STM/DTM  to L glute                     PT Short Term Goals -  10/18/19 2315      PT SHORT TERM GOAL #1   Title Pt to be independent wtih initial HEp    Time 2    Period Weeks    Status New    Target Date 10/29/19      PT SHORT TERM GOAL #2   Title Pt to report decreased pain in back and LE to 0-4/10    Time 3    Period Weeks    Status New    Target Date 11/05/19             PT Long Term Goals - 10/18/19 2316      PT LONG TERM GOAL #1   Title Pt to be independent with final HEP    Time 6    Period Weeks    Status New    Target Date 11/26/19      PT LONG TERM GOAL #2   Title Pt to demo improved lumbar ROM to be [facility] without pain, to improve ability for iADLS.    Time 6    Period Weeks    Status New    Target Date 11/26/19      PT LONG TERM GOAL #3   Title Pt to report decreased pain in back and LE to 0-2/10 with activity    Time 6    Period Weeks    Status New    Target Date 11/26/19      PT LONG TERM GOAL #4   Title Pt to demo improved strenth of bil LEs to at least 4+/5    Time 6    Period Weeks    Status New    Target Date 11/26/19                 Plan - 11/06/19 1556    Clinical Impression Statement Pt continues to have signficant pain in LE, as well as weakness. Little improvement seen in 8 visits. Pt has f/u with Neurosurgeon in 2 weeks. Will hold  until after MD visit, for more accurate assessment of what is causing pain. Pt may require continued care. Pt in agreement with plan.    Personal Factors and Comorbidities Fitness    Examination-Activity Limitations Locomotion Level;Transfers;Bend;Sit;Sleep;Squat;Stairs;Lift;Stand    Examination-Participation Restrictions Meal Prep;Cleaning;Community Activity    Stability/Clinical Decision Making Evolving/Moderate complexity    Rehab Potential Good    PT Frequency 2x / week    PT Duration 6 weeks    PT Treatment/Interventions ADLs/Self Care Home Management;Canalith Repostioning;Cryotherapy;Electrical Stimulation;DME Instruction;Parrafin;Ultrasound;Traction;Moist Heat;Iontophoresis 9m/ml Dexamethasone;Gait training;Stair training;Functional mobility training;Therapeutic activities;Therapeutic exercise;Balance training;Orthotic Fit/Training;Patient/family education;Neuromuscular re-education;Manual techniques;Taping;Dry needling;Passive range of motion;Spinal Manipulations;Joint Manipulations    Consulted and Agree with Plan of Care Patient           Patient will benefit from skilled therapeutic intervention in order to improve the following deficits and impairments:  Abnormal gait, Pain, Improper body mechanics, Decreased mobility, Increased muscle spasms, Decreased strength, Decreased range of motion, Decreased activity tolerance, Impaired flexibility  Visit Diagnosis: Acute bilateral low back pain with left-sided sciatica     Problem List Patient Active Problem List   Diagnosis Date Noted  . Lower extremity edema 06/29/2017  . Hyperlipidemia 06/29/2017  . Tobacco use 06/29/2017    LLyndee Hensen PT, DPT 3:58 PM  11/06/19    [place] [address]Phone: [phone]  Fax:  [phone] Name: Sue TripathiMRN: [id number] of Birth: [birth year]  PHYSICAL THERAPY DISCHARGE  SUMMARY  Visits from Start of Care: 8 Plan: Patient agrees to discharge.  Patient goals were partially met. Patient is being discharged due to lack of progress.  ?????     Referred back to MD for continued pain   [name], PT, DPT 10:55 AM  06/16/20

## 2019-11-12 ENCOUNTER — Encounter: Payer: BC Managed Care – PPO | Admitting: Physical Therapy

## 2019-11-14 ENCOUNTER — Encounter: Payer: BC Managed Care – PPO | Admitting: Physical Therapy

## 2019-11-18 DIAGNOSIS — R030 Elevated blood-pressure reading, without diagnosis of hypertension: Secondary | ICD-10-CM | POA: Diagnosis not present

## 2019-11-18 DIAGNOSIS — M8448XG Pathological fracture, other site, subsequent encounter for fracture with delayed healing: Secondary | ICD-10-CM | POA: Diagnosis not present

## 2019-11-19 ENCOUNTER — Other Ambulatory Visit (HOSPITAL_COMMUNITY): Payer: Self-pay | Admitting: Interventional Radiology

## 2019-12-11 ENCOUNTER — Encounter: Payer: Self-pay | Admitting: Adult Health

## 2019-12-11 ENCOUNTER — Ambulatory Visit (INDEPENDENT_AMBULATORY_CARE_PROVIDER_SITE_OTHER): Payer: BC Managed Care – PPO | Admitting: Adult Health

## 2019-12-11 ENCOUNTER — Other Ambulatory Visit: Payer: Self-pay

## 2019-12-11 VITALS — BP 152/80 | HR 93 | Temp 97.8°F | Ht 62.25 in | Wt 103.0 lb

## 2019-12-11 DIAGNOSIS — M8580 Other specified disorders of bone density and structure, unspecified site: Secondary | ICD-10-CM | POA: Diagnosis not present

## 2019-12-11 NOTE — Progress Notes (Signed)
Subjective:    Patient ID: Sue Chase, female    DOB: January 24, 1956, 64 y.o.   MRN: [id number]  HPI 64 year old female who  has a past medical history of Colon polyps, Hyperlipidemia, Rheumatic fever, Seasonal allergies, and UTI (urinary tract infection).   She was diagnosed with a closed fracture of the sacrum back in August 2021.  Since that time she has gone through physical therapy and has been seen by neurosurgery.  She reports that she continues to be in discomfort but this has improved and continues to improve almost on a daily basis.  Her neurosurgeon ordered a bone density screen which showed osteopenia.  She is wondering if she needs to be referred to an osteopenia clinic or if this can be taken care of through primary care.  He is no longer taking vitamin D and became constipated with calcium so she stopped this.  Her last vitamin D in 2019 showed a level of 7.9.   Review of Systems See HPI   Past Medical History:  Diagnosis Date  . Colon polyps   . Hyperlipidemia   . Rheumatic fever   . Seasonal allergies   . UTI (urinary tract infection)     Social History   Socioeconomic History  . Marital status: Married    Spouse name: Not on file  . Number of children: Not on file  . Years of education: Not on file  . Highest education level: Not on file  Occupational History  . Not on file  Tobacco Use  . Smoking status: Current Every Day Smoker    Packs/day: 1.00    Years: 40.00    Pack years: 40.00  . Smokeless tobacco: Never Used  Vaping Use  . Vaping Use: Never used  Substance and Sexual Activity  . Alcohol use: Yes    Comment: Rum and Coke/Unsure of amount  . Drug use: Never  . Sexual activity: Not on file  Other Topics Concern  . Not on file  Social History Narrative   Married    Two grown children    She enjoys reading, walking   Social Determinants of Health   Financial Resource Strain:   . Difficulty of Paying Living Expenses: Not on file    Food Insecurity:   . Worried About Charity fundraiser in the Last Year: Not on file  . Ran Out of Food in the Last Year: Not on file  Transportation Needs:   . Lack of Transportation (Medical): Not on file  . Lack of Transportation (Non-Medical): Not on file  Physical Activity:   . Days of Exercise per Week: Not on file  . Minutes of Exercise per Session: Not on file  Stress:   . Feeling of Stress : Not on file  Social Connections:   . Frequency of Communication with Friends and Family: Not on file  . Frequency of Social Gatherings with Friends and Family: Not on file  . Attends Religious Services: Not on file  . Active Member of Clubs or Organizations: Not on file  . Attends Archivist Meetings: Not on file  . Marital Status: Not on file  Intimate Partner Violence:   . Fear of Current or Ex-Partner: Not on file  . Emotionally Abused: Not on file  . Physically Abused: Not on file  . Sexually Abused: Not on file    Past Surgical History:  Procedure Laterality Date  . SALIVARY GLAND SURGERY     LATE  80S  . TUBAL LIGATION  1986    Family History  Problem Relation Age of Onset  . Arthritis Mother   . Diabetes Mother   . Heart disease Mother   . Hyperlipidemia Mother   . Hypertension Mother   . Miscarriages / [place] Mother   . Hearing loss Father   . Liver cancer Father   . Lung cancer Father   . Hyperlipidemia Father   . Diabetes Brother   . Lung disease Maternal Grandfather        Black Lung  . Diabetes Paternal Grandmother   . Cancer Paternal Grandfather   . Diabetes Paternal Grandfather     Allergies  Allergen Reactions  . Codeine Itching    Current Outpatient Medications on File Prior to Visit  Medication Sig Dispense Refill  . acetaminophen (TYLENOL) 325 MG tablet Take 2 tablets (650 mg total) by mouth in the morning, at noon, in the evening, and at bedtime. 30 tablet 0  . albuterol (VENTOLIN HFA) 108 (90 Base) MCG/ACT inhaler Inhale 2  puffs into the lungs every 6 (six) hours as needed for wheezing or shortness of breath. 6.7 g 2  . ibuprofen (ADVIL) 400 MG tablet Take 400 mg by mouth every 6 (six) hours as needed.    Marland Kitchen omeprazole (PRILOSEC) 20 MG capsule Take 1 capsule (20 mg total) by mouth daily. 30 capsule 3  . oxyCODONE 10 MG TABS Take 1 tablet (10 mg total) by mouth every 4 (four) hours as needed for severe pain. 8 tablet 0  . pravastatin (PRAVACHOL) 20 MG tablet TAKE ONE TABLET BY MOUTH DAILY 30 tablet 11   No current facility-administered medications on file prior to visit.    BP (!) 152/80 (BP Location: Left Arm, Patient Position: Sitting, Cuff Size: Normal)   Pulse 93   Temp 97.8 F (36.6 C) (Oral)   Ht 5' 2.25" (1.581 m)   Wt 103 lb (46.7 kg)   SpO2 98%   BMI 18.69 kg/m       Objective:   Physical Exam Vitals and nursing note reviewed.  Constitutional:      Appearance: Normal appearance.  Musculoskeletal:        General: Normal range of motion.  Skin:    General: Skin is warm and dry.  Neurological:     General: No focal deficit present.     Mental Status: She is alert and oriented to person, place, and time.  Psychiatric:        Mood and Affect: Mood normal.        Thought Content: Thought content normal.        Judgment: Judgment normal.       Assessment & Plan:  1. Osteopenia, unspecified location -We will check vitamin D as well as CBC and CMP today.  We will try and get Prolia covered for her.  Can consider sending to bone health clinic [facility] in the future.  She was advised to work on heart healthy diet as well as weightbearing exercises to help with bone health.  Depending on her vitamin D level we will treat as needed - VITAMIN D 25 Hydroxy (Vit-D Deficiency, Fractures); Future - VITAMIN D 25 Hydroxy (Vit-D Deficiency, Fractures) - CBC with Differential/Platelet; Future - CMP with eGFR([facility]); Future  [name], NP

## 2019-12-11 NOTE — Patient Instructions (Addendum)
It was great seeing you today   I will follow up with you regarding your blood work and will let you know what I find out about Prolia     Osteopenia  Osteopenia is a loss of thickness (density) inside of the bones. Another name for osteopenia is low bone mass. Mild osteopenia is a normal part of aging. It is not a disease, and it does not cause symptoms. However, if you have osteopenia and continue to lose bone mass, you could develop a condition that causes the bones to become thin and break more easily (osteoporosis). You may also lose some height, have back pain, and have a stooped posture. Although osteopenia is not a disease, making changes to your lifestyle and diet can help to prevent osteopenia from developing into osteoporosis. What are the causes? Osteopenia is caused by loss of calcium in the bones.  Bones are constantly changing. Old bone cells are continually being replaced with new bone cells. This process builds new bone. The mineral calcium is needed to build new bone and maintain bone density. Bone density is usually highest around age 69. After that, most people's bodies cannot replace all the bone they have lost with new bone. What increases the risk? You are more likely to develop this condition if:  You are older than age 63.  You are a woman who went through menopause early.  You have a long illness that keeps you in bed.  You do not get enough exercise.  You lack certain nutrients (malnutrition).  You have an overactive thyroid gland (hyperthyroidism).  You smoke.  You drink a lot of alcohol.  You are taking medicines that weaken the bones, such as steroids. What are the signs or symptoms? This condition does not cause any symptoms. You may have a slightly higher risk for bone breaks (fractures), so getting fractures more easily than normal may be an indication of osteopenia. How is this diagnosed? Your health care provider can diagnose this condition with  a special type of X-ray exam that measures bone density (dual-energy X-ray absorptiometry, DEXA). This test can measure bone density in your hips, spine, and wrists. Osteopenia has no symptoms, so this condition is usually diagnosed after a routine bone density screening test is done for osteoporosis. This routine screening is usually done for:  Women who are age 65 or older.  Men who are age 71 or older. If you have risk factors for osteopenia, you may have the screening test at an earlier age. How is this treated? Making dietary and lifestyle changes can lower your risk for osteoporosis. If you have severe osteopenia that is close to becoming osteoporosis, your health care provider may prescribe medicines and dietary supplements such as calcium and vitamin D. These supplements help to rebuild bone density. Follow these instructions at home:   Take over-the-counter and prescription medicines only as told by your health care provider. These include vitamins and supplements.  Eat a diet that is high in calcium and vitamin D. ? Calcium is found in dairy products, beans, salmon, and leafy green vegetables like spinach and broccoli. ? Look for foods that have vitamin D and calcium added to them (fortified foods), such as orange juice, cereal, and bread.  Do 30 or more minutes of a weight-bearing exercise every day, such as walking, jogging, or playing a sport. These types of exercises strengthen the bones.  Take precautions at home to lower your risk of falling, such as: ? Keeping rooms well-lit  and free of clutter, such as cords. ? Installing safety rails on stairs. ? Using rubber mats in the bathroom or other areas that are often wet or slippery.  Do not use any products that contain nicotine or tobacco, such as cigarettes and e-cigarettes. If you need help quitting, ask your health care provider.  Avoid alcohol or limit alcohol intake to no more than 1 drink a day for nonpregnant women and  2 drinks a day for men. One drink equals 12 oz of beer, 5 oz of wine, or 1 oz of hard liquor.  Keep all follow-up visits as told by your health care provider. This is important. Contact a health care provider if:  You have not had a bone density screening for osteoporosis and you are: ? A woman, age 36 or older. ? A man, age 24 or older.  You are a postmenopausal woman who has not had a bone density screening for osteoporosis.  You are older than age 75 and you want to know if you should have bone density screening for osteoporosis. Summary  Osteopenia is a loss of thickness (density) inside of the bones. Another name for osteopenia is low bone mass.  Osteopenia is not a disease, but it may increase your risk for a condition that causes the bones to become thin and break more easily (osteoporosis).  You may be at risk for osteopenia if you are older than age 62 or if you are a woman who went through early menopause.  Osteopenia does not cause any symptoms, but it can be diagnosed with a bone density screening test.  Dietary and lifestyle changes are the first treatment for osteopenia. These may lower your risk for osteoporosis. This information is not intended to replace advice given to you by your health care provider. Make sure you discuss any questions you have with your health care provider. Document Revised: 01/20/2017 Document Reviewed: 11/16/2016 Elsevier Patient Education  2020 [employer].

## 2019-12-12 LAB — VITAMIN D 25 HYDROXY (VIT D DEFICIENCY, FRACTURES): Vit D, 25-Hydroxy: 53 ng/mL (ref 30–100)

## 2019-12-24 ENCOUNTER — Telehealth: Payer: Self-pay | Admitting: *Deleted

## 2019-12-24 NOTE — Telephone Encounter (Signed)
Insurance verification started 12/24/19

## 2019-12-31 NOTE — Telephone Encounter (Signed)
Cost for Prolia: 20% of prolia = $230 admin fee = $35

## 2020-01-02 NOTE — Telephone Encounter (Signed)
Left message on machine for patient to return our call 

## 2020-02-26 NOTE — Telephone Encounter (Signed)
2nd attempt to reach patient regarding Prolia. Left message on machine for patient to return our call.

## 2020-03-03 NOTE — Telephone Encounter (Signed)
Pt returning your call about Cost for Prolia:

## 2020-04-08 NOTE — Telephone Encounter (Signed)
3rd attempt to call patient.  Left detailed message on machine for the patient that the cost is about $265.  Funding is available.

## 2020-07-07 ENCOUNTER — Telehealth: Payer: Self-pay | Admitting: Adult Health

## 2020-07-07 DIAGNOSIS — M79672 Pain in left foot: Secondary | ICD-10-CM

## 2020-07-07 NOTE — Telephone Encounter (Signed)
Order placed, they will contact the pt to schedule.

## 2020-07-07 NOTE — Telephone Encounter (Signed)
Okay to place? 

## 2020-07-07 NOTE — Telephone Encounter (Signed)
pt is requesting a referral to see podiatry for left heel pain

## 2020-07-09 ENCOUNTER — Ambulatory Visit (INDEPENDENT_AMBULATORY_CARE_PROVIDER_SITE_OTHER): Payer: BC Managed Care – PPO | Admitting: Podiatry

## 2020-07-09 ENCOUNTER — Other Ambulatory Visit: Payer: Self-pay

## 2020-07-09 ENCOUNTER — Ambulatory Visit (INDEPENDENT_AMBULATORY_CARE_PROVIDER_SITE_OTHER): Payer: BC Managed Care – PPO

## 2020-07-09 DIAGNOSIS — M79672 Pain in left foot: Secondary | ICD-10-CM

## 2020-07-09 DIAGNOSIS — M722 Plantar fascial fibromatosis: Secondary | ICD-10-CM | POA: Diagnosis not present

## 2020-07-09 DIAGNOSIS — S9032XA Contusion of left foot, initial encounter: Secondary | ICD-10-CM

## 2020-07-09 MED ORDER — MELOXICAM 15 MG PO TABS
15.0000 mg | ORAL_TABLET | Freq: Every day | ORAL | 0 refills | Status: DC
Start: 2020-07-09 — End: 2021-02-19

## 2020-07-09 NOTE — Patient Instructions (Signed)
For instructions on how to put on your Plantar Fascial Brace, please visit [email or web address]   Plantar Fasciitis (Heel Spur Syndrome) with Rehab The plantar fascia is a fibrous, ligament-like, soft-tissue structure that spans the bottom of the foot. Plantar fasciitis is a condition that causes pain in the foot due to inflammation of the tissue. SYMPTOMS   Pain and tenderness on the underneath side of the foot.  Pain that worsens with standing or walking. CAUSES  Plantar fasciitis is caused by irritation and injury to the plantar fascia on the underneath side of the foot. Common mechanisms of injury include:  Direct trauma to bottom of the foot.  Damage to a small nerve that runs under the foot where the main fascia attaches to the heel bone.  Stress placed on the plantar fascia due to bone spurs. RISK INCREASES WITH:   Activities that place stress on the plantar fascia (running, jumping, pivoting, or cutting).  Poor strength and flexibility.  Improperly fitted shoes.  Tight calf muscles.  Flat feet.  Failure to warm-up properly before activity.  Obesity. PREVENTION  Warm up and stretch properly before activity.  Allow for adequate recovery between workouts.  Maintain physical fitness:  Strength, flexibility, and endurance.  Cardiovascular fitness.  Maintain a health body weight.  Avoid stress on the plantar fascia.  Wear properly fitted shoes, including arch supports for individuals who have flat feet.  PROGNOSIS  If treated properly, then the symptoms of plantar fasciitis usually resolve without surgery. However, occasionally surgery is necessary.  RELATED COMPLICATIONS   Recurrent symptoms that may result in a chronic condition.  Problems of the lower back that are caused by compensating for the injury, such as limping.  Pain or weakness of the foot during push-off following surgery.  Chronic inflammation, scarring, and partial or complete  fascia tear, occurring more often from repeated injections.  TREATMENT  Treatment initially involves the use of ice and medication to help reduce pain and inflammation. The use of strengthening and stretching exercises may help reduce pain with activity, especially stretches of the Achilles tendon. These exercises may be performed at home or with a therapist. Your caregiver may recommend that you use heel cups of arch supports to help reduce stress on the plantar fascia. Occasionally, corticosteroid injections are given to reduce inflammation. If symptoms persist for greater than 6 months despite non-surgical (conservative), then surgery may be recommended.   MEDICATION   If pain medication is necessary, then nonsteroidal anti-inflammatory medications, such as aspirin and ibuprofen, or other minor pain relievers, such as acetaminophen, are often recommended.  Do not take pain medication within 7 days before surgery.  Prescription pain relievers may be given if deemed necessary by your caregiver. Use only as directed and only as much as you need.  Corticosteroid injections may be given by your caregiver. These injections should be reserved for the most serious cases, because they may only be given a certain number of times.  HEAT AND COLD  Cold treatment (icing) relieves pain and reduces inflammation. Cold treatment should be applied for 10 to 15 minutes every 2 to 3 hours for inflammation and pain and immediately after any activity that aggravates your symptoms. Use ice packs or massage the area with a piece of ice (ice massage).  Heat treatment may be used prior to performing the stretching and strengthening activities prescribed by your caregiver, physical therapist, or athletic trainer. Use a heat pack or soak the injury in warm water.  SEEK IMMEDIATE MEDICAL   CARE IF:  Treatment seems to offer no benefit, or the condition worsens.  Any medications produce adverse side effects.   EXERCISES- RANGE OF MOTION (ROM) AND STRETCHING EXERCISES - Plantar Fasciitis (Heel Spur Syndrome) These exercises may help you when beginning to rehabilitate your injury. Your symptoms may resolve with or without further involvement from your physician, physical therapist or athletic trainer. While completing these exercises, remember:   Restoring tissue flexibility helps normal motion to return to the joints. This allows healthier, less painful movement and activity.  An effective stretch should be held for at least 30 seconds.  A stretch should never be painful. You should only feel a gentle lengthening or release in the stretched tissue.  RANGE OF MOTION - Toe Extension, Flexion  Sit with your right / left leg crossed over your opposite knee.  Grasp your toes and gently pull them back toward the top of your foot. You should feel a stretch on the bottom of your toes and/or foot.  Hold this stretch for 10 seconds.  Now, gently pull your toes toward the bottom of your foot. You should feel a stretch on the top of your toes and or foot.  Hold this stretch for 10 seconds. Repeat  times. Complete this stretch 3 times per day.   RANGE OF MOTION - Ankle Dorsiflexion, Active Assisted  Remove shoes and sit on a chair that is preferably not on a carpeted surface.  Place right / left foot under knee. Extend your opposite leg for support.  Keeping your heel down, slide your right / left foot back toward the chair until you feel a stretch at your ankle or calf. If you do not feel a stretch, slide your bottom forward to the edge of the chair, while still keeping your heel down.  Hold this stretch for 10 seconds. Repeat 3 times. Complete this stretch 2 times per day.   STRETCH  Gastroc, Standing  Place hands on wall.  Extend right / left leg, keeping the front knee somewhat bent.  Slightly point your toes inward on your back foot.  Keeping your right / left heel on the floor and your  knee straight, shift your weight toward the wall, not allowing your back to arch.  You should feel a gentle stretch in the right / left calf. Hold this position for 10 seconds. Repeat 3 times. Complete this stretch 2 times per day.  STRETCH  Soleus, Standing  Place hands on wall.  Extend right / left leg, keeping the other knee somewhat bent.  Slightly point your toes inward on your back foot.  Keep your right / left heel on the floor, bend your back knee, and slightly shift your weight over the back leg so that you feel a gentle stretch deep in your back calf.  Hold this position for 10 seconds. Repeat 3 times. Complete this stretch 2 times per day.  STRETCH  Gastrocsoleus, Standing  Note: This exercise can place a lot of stress on your foot and ankle. Please complete this exercise only if specifically instructed by your caregiver.   Place the ball of your right / left foot on a step, keeping your other foot firmly on the same step.  Hold on to the wall or a rail for balance.  Slowly lift your other foot, allowing your body weight to press your heel down over the edge of the step.  You should feel a stretch in your right / left calf.  Hold this   position for 10 seconds.  Repeat this exercise with a slight bend in your right / left knee. Repeat 3 times. Complete this stretch 2 times per day.   STRENGTHENING EXERCISES - Plantar Fasciitis (Heel Spur Syndrome)  These exercises may help you when beginning to rehabilitate your injury. They may resolve your symptoms with or without further involvement from your physician, physical therapist or athletic trainer. While completing these exercises, remember:   Muscles can gain both the endurance and the strength needed for everyday activities through controlled exercises.  Complete these exercises as instructed by your physician, physical therapist or athletic trainer. Progress the resistance and repetitions only as guided.  STRENGTH -  Towel Curls  Sit in a chair positioned on a non-carpeted surface.  Place your foot on a towel, keeping your heel on the floor.  Pull the towel toward your heel by only curling your toes. Keep your heel on the floor. Repeat 3 times. Complete this exercise 2 times per day.  STRENGTH - Ankle Inversion  Secure one end of a rubber exercise band/tubing to a fixed object (table, pole). Loop the other end around your foot just before your toes.  Place your fists between your knees. This will focus your strengthening at your ankle.  Slowly, pull your big toe up and in, making sure the band/tubing is positioned to resist the entire motion.  Hold this position for 10 seconds.  Have your muscles resist the band/tubing as it slowly pulls your foot back to the starting position. Repeat 3 times. Complete this exercises 2 times per day.  Document Released: 02/07/2005 Document Revised: 05/02/2011 Document Reviewed: 05/22/2008 [employer] Patient Information 2014 [employer]. 

## 2020-07-13 NOTE — Progress Notes (Signed)
Subjective:   Patient ID: Sue Chase, female   DOB: 65 y.o.   MRN: [id number]   HPI 65 year old female presents the office with concerns of left foot pain which started last Friday.  She states that it hurts to put her heel ground at times.  She has noticed mild swelling and redness to the area.  No recent injury or falls.  She did purchase new inserts which did not help.  No recent injury or trauma.   Review of Systems  All other systems reviewed and are negative.  Past Medical History:  Diagnosis Date  . Colon polyps   . Hyperlipidemia   . Rheumatic fever   . Seasonal allergies   . UTI (urinary tract infection)     Past Surgical History:  Procedure Laterality Date  . SALIVARY GLAND SURGERY     LATE 80S  . TUBAL LIGATION  1986     Current Outpatient Medications:  .  meloxicam (MOBIC) 15 MG tablet, Take 1 tablet (15 mg total) by mouth daily., Disp: 14 tablet, Rfl: 0 .  acetaminophen (TYLENOL) 325 MG tablet, Take 2 tablets (650 mg total) by mouth in the morning, at noon, in the evening, and at bedtime., Disp: 30 tablet, Rfl: 0 .  albuterol (VENTOLIN HFA) 108 (90 Base) MCG/ACT inhaler, Inhale 2 puffs into the lungs every 6 (six) hours as needed for wheezing or shortness of breath., Disp: 6.7 g, Rfl: 2 .  ibuprofen (ADVIL) 400 MG tablet, Take 400 mg by mouth every 6 (six) hours as needed., Disp: , Rfl:  .  methocarbamol (ROBAXIN) 750 MG tablet, Take 750 mg by mouth 4 (four) times daily., Disp: , Rfl:  .  omeprazole (PRILOSEC) 20 MG capsule, Take 1 capsule (20 mg total) by mouth daily., Disp: 30 capsule, Rfl: 3 .  oxyCODONE 10 MG TABS, Take 1 tablet (10 mg total) by mouth every 4 (four) hours as needed for severe pain., Disp: 8 tablet, Rfl: 0 .  pravastatin (PRAVACHOL) 20 MG tablet, TAKE ONE TABLET BY MOUTH DAILY, Disp: 30 tablet, Rfl: 11  Allergies  Allergen Reactions  . Codeine Itching         Objective:  Physical Exam  General: AAO x3, NAD  Dermatological:  Skin is warm, dry and supple bilateral. There are no open sores, no preulcerative lesions, no rash or signs of infection present.  Vascular: Dorsalis Pedis artery and Posterior Tibial artery pedal pulses are 2/4 bilateral with immedate capillary fill time. There is no pain with calf compression, swelling, warmth, erythema.   Neruologic: Grossly intact via light touch bilateral.  Negative Tinel sign.  Musculoskeletal: There is tenderness palpation on plantar aspect of calcaneus and insertion plantar fascia.  There is no pain with lateral compression of calcaneus.  No pain to the posterior calcaneus.  No pain Achilles tendon.  Flexor, extensor tendons appear to be intact.  Muscular strength 5/5 in all groups tested bilateral.  Gait: Unassisted, Nonantalgic.       Assessment:   65 year old female with left foot pain, likely plantar fasciitis     Plan:  -Treatment options discussed including all alternatives, risks, and complications -Etiology of symptoms were discussed -X-rays were obtained and reviewed with the patient.  There is no evidence of acute fracture or stress fracture identified today. -Hold off on steroid injection. -Prescribed mobic. Discussed side effects of the medication and directed to stop if any are to occur and call the office.  -Plantar fascial brace dispensed -Stretching, icing  daily. -Discussed supportive shoe gear.  Trula Slade DPM

## 2021-02-12 ENCOUNTER — Ambulatory Visit: Payer: BC Managed Care – PPO | Admitting: Adult Health

## 2021-02-17 ENCOUNTER — Telehealth: Payer: Self-pay | Admitting: Adult Health

## 2021-02-17 DIAGNOSIS — R0981 Nasal congestion: Secondary | ICD-10-CM

## 2021-02-17 MED ORDER — ALBUTEROL SULFATE HFA 108 (90 BASE) MCG/ACT IN AERS: 2.0000 | INHALATION_SPRAY | Freq: Four times a day (QID) | RESPIRATORY_TRACT | 1 refills | Status: DC | PRN

## 2021-02-17 NOTE — Telephone Encounter (Signed)
Patient called because she is a bit sick and would like an inhaler to be sent in for her. Patient states she is unable to commit to an appointment because her husband is dying and she is busy with his appointments and possibly moving him to hospice.    Please send to [facility] on  [address] [place], [place] [address]       Please advise

## 2021-02-17 NOTE — Telephone Encounter (Signed)
I have spoken to the provider and given her current situation we have refilled the inhaler for the pt. I have called the pt that we did send it in to her new pharmacy, however if she is not getting any better then she needs to be seen, if it is not by [place] then at least an urgent care. Pt has stated understanding and stated that she currently just has a cold and every moment is changing with her husband.

## 2021-02-18 ENCOUNTER — Inpatient Hospital Stay (HOSPITAL_BASED_OUTPATIENT_CLINIC_OR_DEPARTMENT_OTHER)
Admission: EM | Admit: 2021-02-18 | Discharge: 2021-02-21 | DRG: 176 | Disposition: A | Discharge status: Home / Self Care | Payer: BC Managed Care – PPO | Attending: Internal Medicine | Admitting: Internal Medicine

## 2021-02-18 ENCOUNTER — Emergency Department (HOSPITAL_BASED_OUTPATIENT_CLINIC_OR_DEPARTMENT_OTHER): Payer: BC Managed Care – PPO

## 2021-02-18 ENCOUNTER — Telehealth: Payer: Self-pay

## 2021-02-18 ENCOUNTER — Other Ambulatory Visit: Payer: Self-pay

## 2021-02-18 ENCOUNTER — Emergency Department (HOSPITAL_BASED_OUTPATIENT_CLINIC_OR_DEPARTMENT_OTHER): Payer: BC Managed Care – PPO | Admitting: Radiology

## 2021-02-18 ENCOUNTER — Encounter (HOSPITAL_BASED_OUTPATIENT_CLINIC_OR_DEPARTMENT_OTHER): Payer: Self-pay | Admitting: *Deleted

## 2021-02-18 DIAGNOSIS — Z791 Long term (current) use of non-steroidal anti-inflammatories (NSAID): Secondary | ICD-10-CM | POA: Diagnosis not present

## 2021-02-18 DIAGNOSIS — Z79899 Other long term (current) drug therapy: Secondary | ICD-10-CM | POA: Diagnosis not present

## 2021-02-18 DIAGNOSIS — R059 Cough, unspecified: Secondary | ICD-10-CM | POA: Diagnosis not present

## 2021-02-18 DIAGNOSIS — I10 Essential (primary) hypertension: Secondary | ICD-10-CM | POA: Diagnosis present

## 2021-02-18 DIAGNOSIS — I824Y1 Acute embolism and thrombosis of unspecified deep veins of right proximal lower extremity: Secondary | ICD-10-CM | POA: Diagnosis not present

## 2021-02-18 DIAGNOSIS — Z86711 Personal history of pulmonary embolism: Secondary | ICD-10-CM | POA: Diagnosis present

## 2021-02-18 DIAGNOSIS — J302 Other seasonal allergic rhinitis: Secondary | ICD-10-CM | POA: Diagnosis present

## 2021-02-18 DIAGNOSIS — Z20822 Contact with and (suspected) exposure to covid-19: Secondary | ICD-10-CM | POA: Diagnosis present

## 2021-02-18 DIAGNOSIS — F17210 Nicotine dependence, cigarettes, uncomplicated: Secondary | ICD-10-CM | POA: Diagnosis present

## 2021-02-18 DIAGNOSIS — E871 Hypo-osmolality and hyponatremia: Secondary | ICD-10-CM | POA: Diagnosis not present

## 2021-02-18 DIAGNOSIS — Z833 Family history of diabetes mellitus: Secondary | ICD-10-CM

## 2021-02-18 DIAGNOSIS — J90 Pleural effusion, not elsewhere classified: Secondary | ICD-10-CM | POA: Diagnosis not present

## 2021-02-18 DIAGNOSIS — I2699 Other pulmonary embolism without acute cor pulmonale: Principal | ICD-10-CM

## 2021-02-18 DIAGNOSIS — I82411 Acute embolism and thrombosis of right femoral vein: Secondary | ICD-10-CM | POA: Diagnosis not present

## 2021-02-18 DIAGNOSIS — I82401 Acute embolism and thrombosis of unspecified deep veins of right lower extremity: Secondary | ICD-10-CM | POA: Diagnosis present

## 2021-02-18 DIAGNOSIS — E785 Hyperlipidemia, unspecified: Secondary | ICD-10-CM | POA: Diagnosis not present

## 2021-02-18 DIAGNOSIS — Z83438 Family history of other disorder of lipoprotein metabolism and other lipidemia: Secondary | ICD-10-CM

## 2021-02-18 DIAGNOSIS — E876 Hypokalemia: Secondary | ICD-10-CM | POA: Diagnosis not present

## 2021-02-18 DIAGNOSIS — I82431 Acute embolism and thrombosis of right popliteal vein: Secondary | ICD-10-CM | POA: Diagnosis not present

## 2021-02-18 DIAGNOSIS — Z8261 Family history of arthritis: Secondary | ICD-10-CM | POA: Diagnosis not present

## 2021-02-18 DIAGNOSIS — D72829 Elevated white blood cell count, unspecified: Secondary | ICD-10-CM | POA: Diagnosis not present

## 2021-02-18 DIAGNOSIS — M7989 Other specified soft tissue disorders: Secondary | ICD-10-CM

## 2021-02-18 DIAGNOSIS — R0981 Nasal congestion: Secondary | ICD-10-CM

## 2021-02-18 DIAGNOSIS — I2602 Saddle embolus of pulmonary artery with acute cor pulmonale: Secondary | ICD-10-CM | POA: Diagnosis not present

## 2021-02-18 DIAGNOSIS — J449 Chronic obstructive pulmonary disease, unspecified: Secondary | ICD-10-CM | POA: Diagnosis not present

## 2021-02-18 DIAGNOSIS — Z8249 Family history of ischemic heart disease and other diseases of the circulatory system: Secondary | ICD-10-CM | POA: Diagnosis not present

## 2021-02-18 DIAGNOSIS — Z885 Allergy status to narcotic agent status: Secondary | ICD-10-CM | POA: Diagnosis not present

## 2021-02-18 LAB — TROPONIN I (HIGH SENSITIVITY)
Troponin I (High Sensitivity): 5 ng/L (ref <18)
Troponin I (High Sensitivity): 5 ng/L (ref <18)

## 2021-02-18 LAB — CBC WITH DIFFERENTIAL/PLATELET
Abs Immature Granulocytes: 0.06 10*3/uL (ref 0.00–0.07)
Basophils Absolute: 0 10*3/uL (ref 0.0–0.1)
Basophils Relative: 0 %
Eosinophils Absolute: 0.4 10*3/uL (ref 0.0–0.5)
Eosinophils Relative: 3 %
HCT: 48.1 % — ABNORMAL HIGH (ref 36.0–46.0)
Hemoglobin: 17 g/dL — ABNORMAL HIGH (ref 12.0–15.0)
Immature Granulocytes: 1 %
Lymphocytes Relative: 19 %
Lymphs Abs: 2.1 10*3/uL (ref 0.7–4.0)
MCH: 39.9 pg — ABNORMAL HIGH (ref 26.0–34.0)
MCHC: 35.3 g/dL (ref 30.0–36.0)
MCV: 112.9 fL — ABNORMAL HIGH (ref 80.0–100.0)
Monocytes Absolute: 1.3 10*3/uL — ABNORMAL HIGH (ref 0.1–1.0)
Monocytes Relative: 12 %
Neutro Abs: 7.2 10*3/uL (ref 1.7–7.7)
Neutrophils Relative %: 65 %
Platelets: 214 10*3/uL (ref 150–400)
RBC: 4.26 MIL/uL (ref 3.87–5.11)
RDW: 15.1 % (ref 11.5–15.5)
WBC: 11.1 10*3/uL — ABNORMAL HIGH (ref 4.0–10.5)
nRBC: 0 % (ref 0.0–0.2)

## 2021-02-18 LAB — RESP PANEL BY RT-PCR (FLU A&B, COVID) ARPGX2
Influenza A by PCR: NEGATIVE
Influenza B by PCR: NEGATIVE
SARS Coronavirus 2 by RT PCR: NEGATIVE

## 2021-02-18 LAB — BASIC METABOLIC PANEL
Anion gap: 9 (ref 5–15)
BUN: 12 mg/dL (ref 8–23)
CO2: 29 mmol/L (ref 22–32)
Calcium: 8.8 mg/dL — ABNORMAL LOW (ref 8.9–10.3)
Chloride: 97 mmol/L — ABNORMAL LOW (ref 98–111)
Creatinine, Ser: 0.4 mg/dL — ABNORMAL LOW (ref 0.44–1.00)
GFR, Estimated: >60 mL/min (ref >60)
Glucose, Bld: 88 mg/dL (ref 70–99)
Potassium: 2.5 mmol/L — CL (ref 3.5–5.1)
Sodium: 135 mmol/L (ref 135–145)

## 2021-02-18 LAB — PROTIME-INR
INR: 1 (ref 0.8–1.2)
Prothrombin Time: 12.9 seconds (ref 11.4–15.2)

## 2021-02-18 LAB — BRAIN NATRIURETIC PEPTIDE: B Natriuretic Peptide: 111.9 pg/mL — ABNORMAL HIGH (ref 0.0–100.0)

## 2021-02-18 LAB — APTT: aPTT: 31 seconds (ref 24–36)

## 2021-02-18 IMAGING — DX DG CHEST 2V
2 series · 2 of 2 positions shown · non-contrast
Comparison: [DATE]

CLINICAL DATA: Cough

EXAM:
CHEST - 2 VIEW

[chest pa]
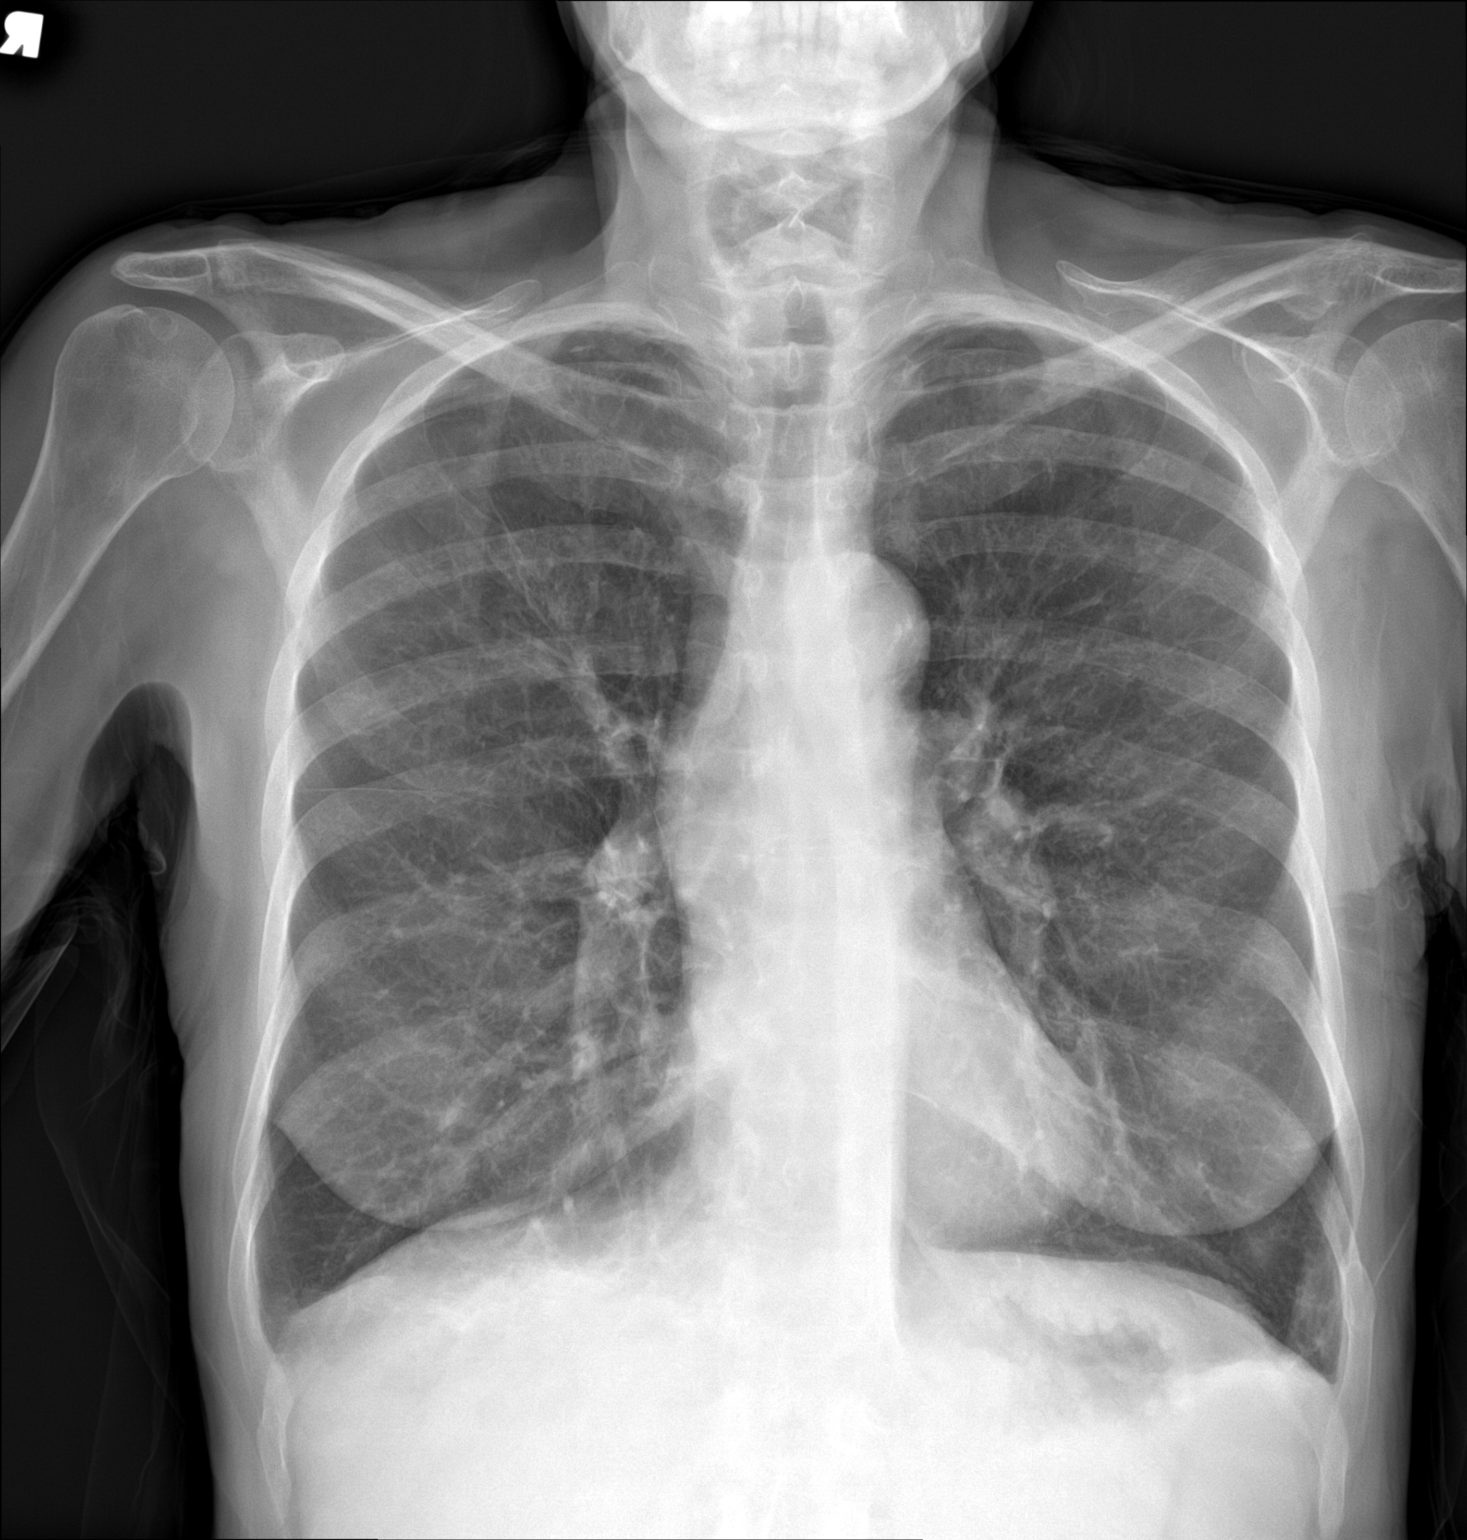

[chest lat]
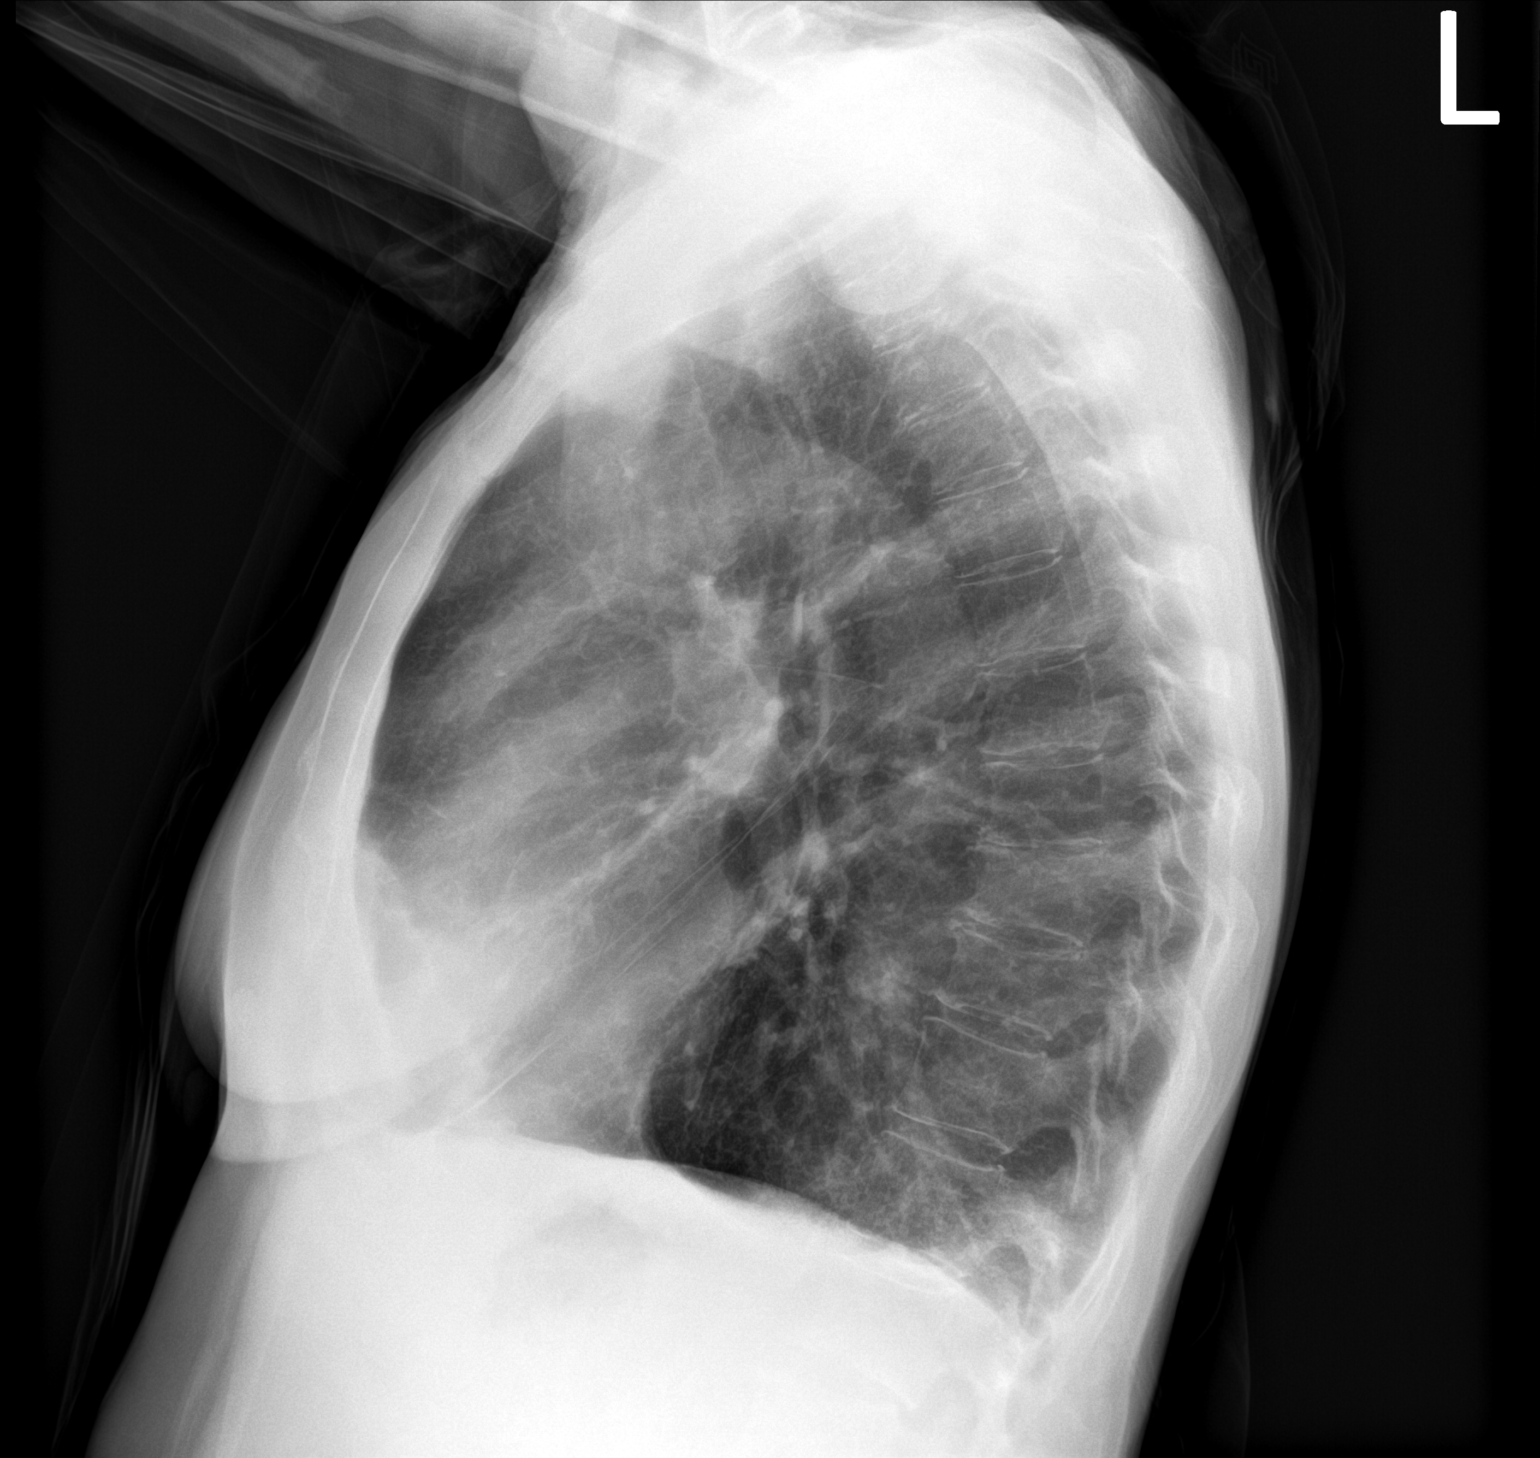

[2 of 2 positions shown; findings below may reference images not displayed]

FINDINGS: Cardiac size is within normal limits. Low position of diaphragms
suggests COPD. New small patchy infiltrate is seen in the posterior
right lower lung fields. Linear density in the right upper lung
fields has not changed significantly suggesting scarring. Linear
density in the right middle lobe has not changed significantly,
possibly suggesting scarring. There is no significant pleural
effusion or pneumothorax.
IMPRESSION: There is new patchy infiltrate in the posterior right lower lung
fields suggesting atelectasis/pneumonia. Part of this finding may
suggest underlying scarring. COPD. There is no pleural effusion or
pneumothorax.

## 2021-02-18 IMAGING — US US EXTREM LOW VENOUS*R*
1 series · 13 of 24 positions shown · non-contrast
Comparison: None.

CLINICAL DATA: Right lower extremity swelling.

EXAM:
Right LOWER EXTREMITY VENOUS DOPPLER ULTRASOUND
TECHNIQUE: Gray-scale sonography with compression, as well as color and duplex
ultrasound, were performed to evaluate the deep venous system(s)
from the level of the common femoral vein through the popliteal and
proximal calf veins.

[Series 1: us venous img upper uni right (dvt) · portal-venous · 55 acquisitions, 13 frames shown]
[im 1/55]
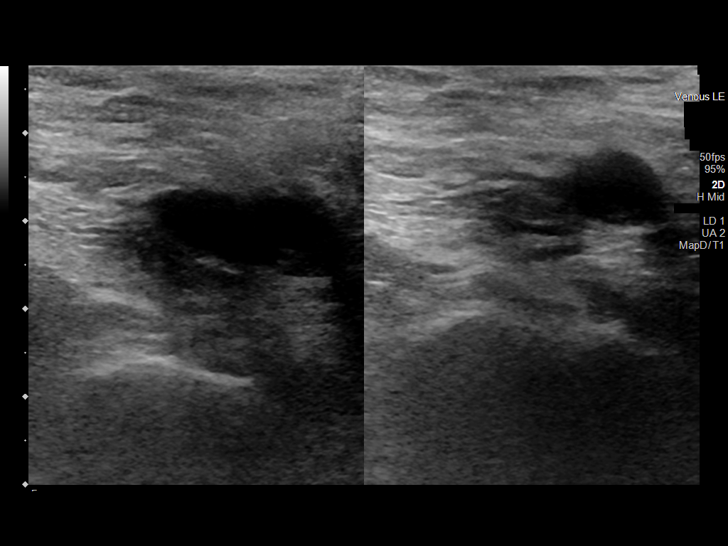
[im 5/55]
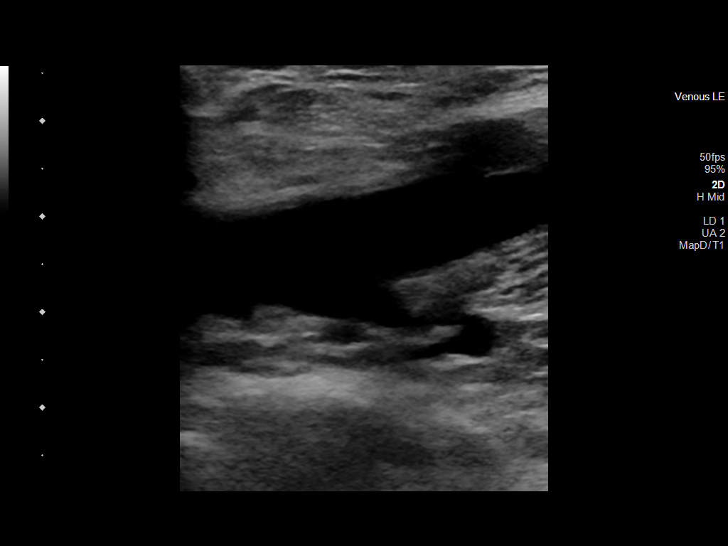
[im 10/55]
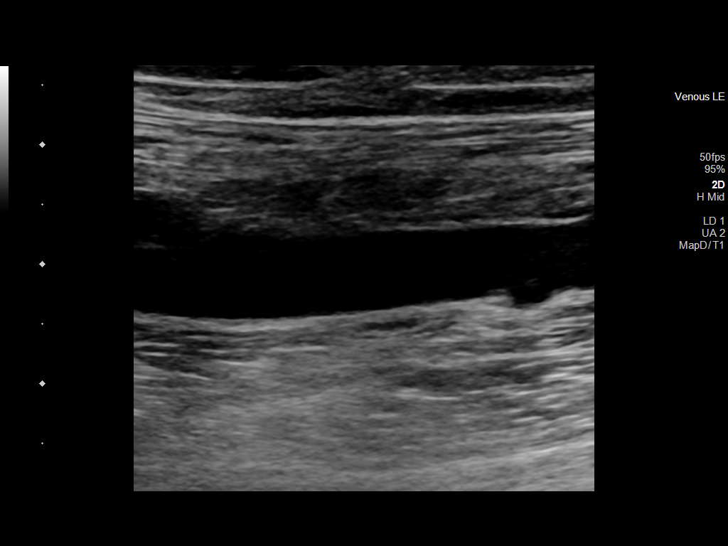
[im 12/55]
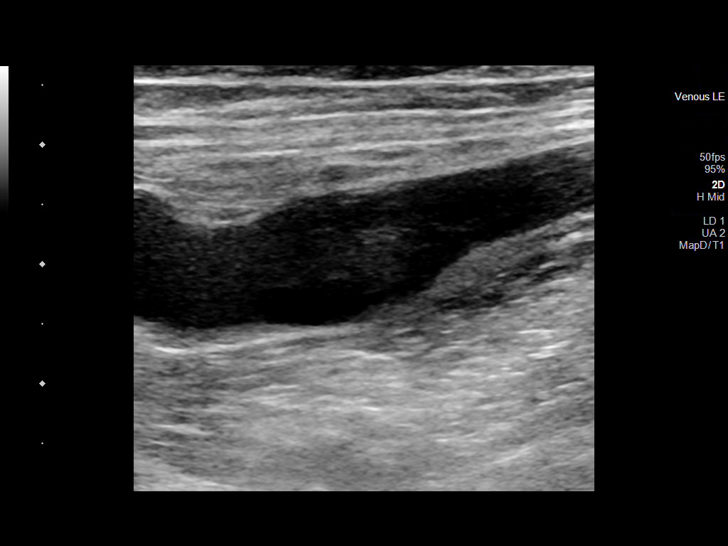
[im 17/55]
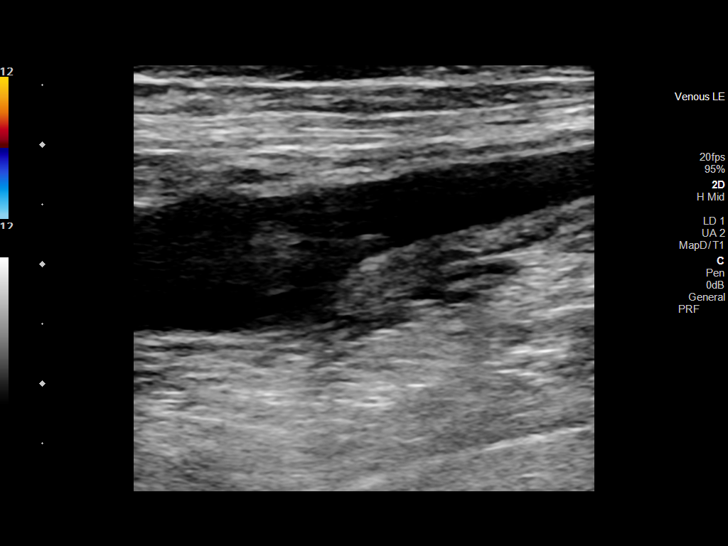
[im 22/55]
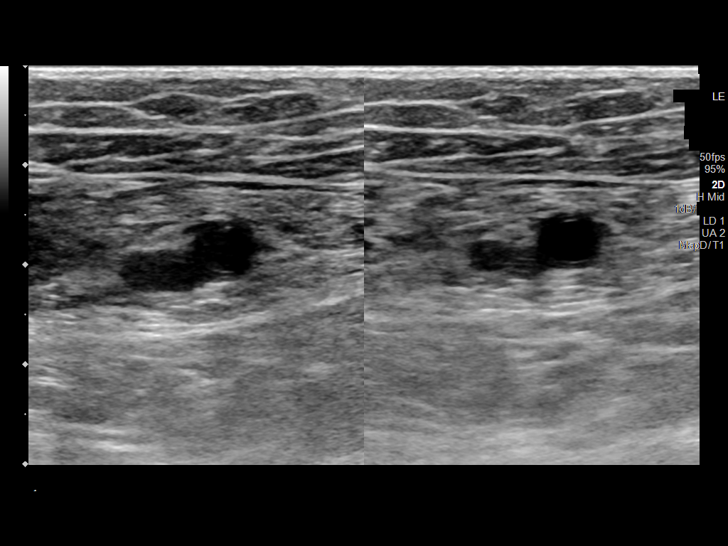
[im 29/55]
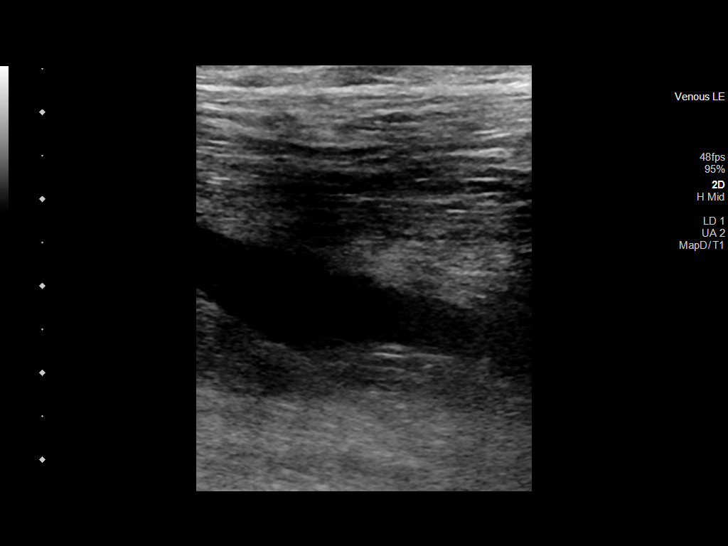
[im 31/55]
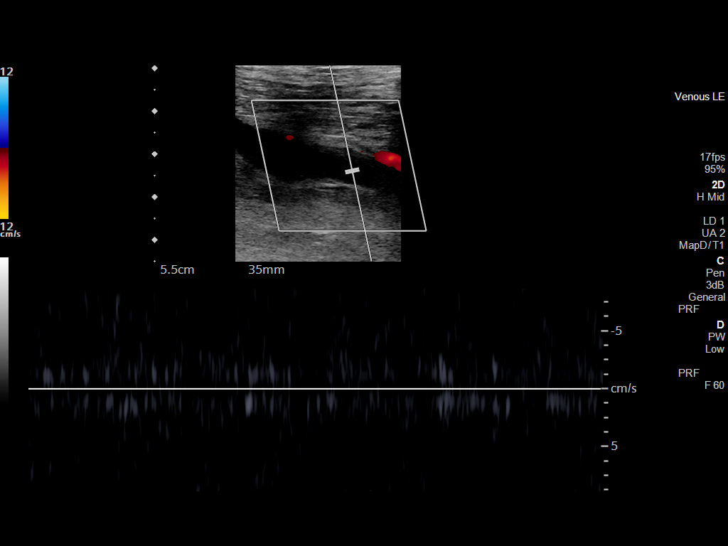
[im 36/55]
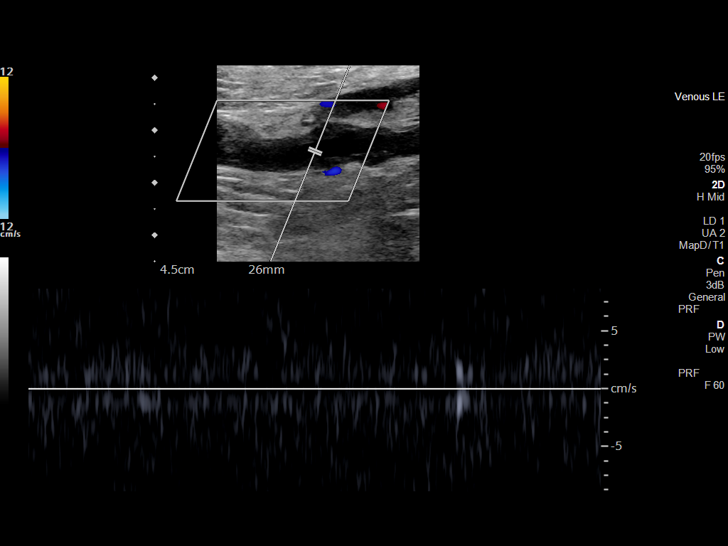
[im 40/55]
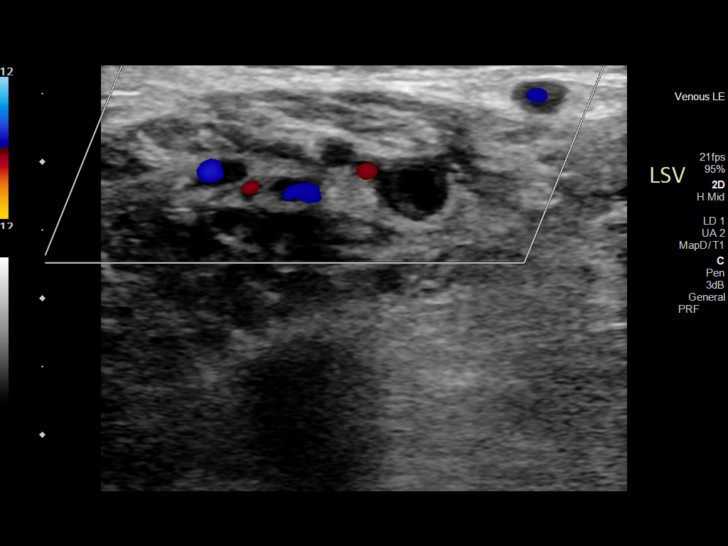
[im 45/55]
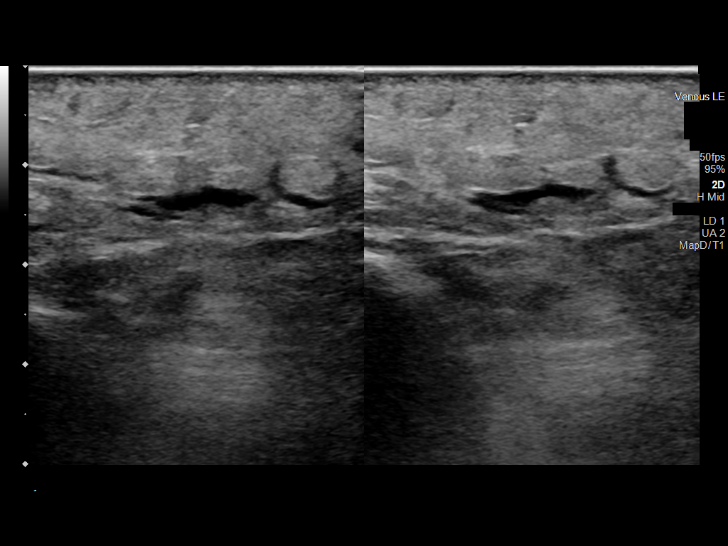
[im 50/55]
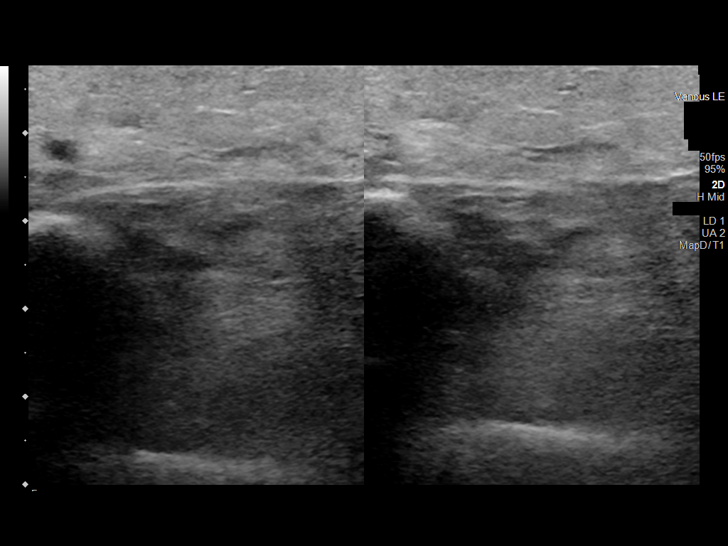
[im 55/55]
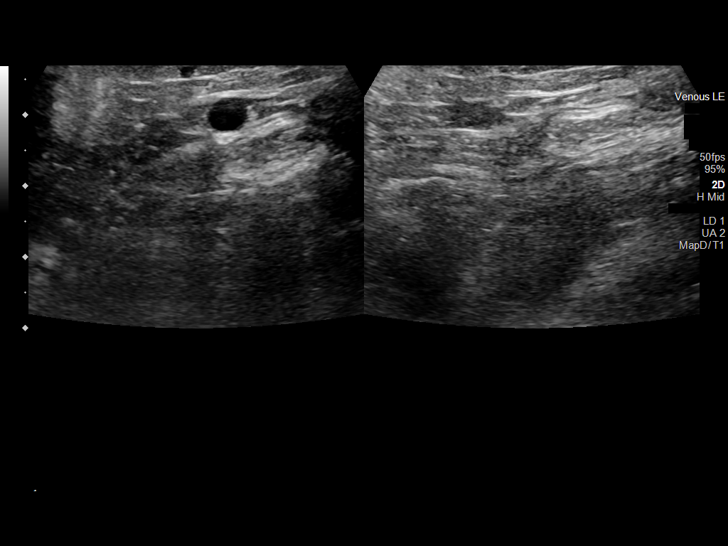

[13 of 24 positions shown; findings below may reference images not displayed]

FINDINGS: VENOUS

Echogenic filling defects are demonstrated in the right femoral and
popliteal vein as well as at the saphenofemoral junction and
muscular perforators. Involved vessels are noncompressible with lack
of flow on color flow Doppler imaging. Changes are consistent with
acute deep venous thrombosis.

The right common femoral vein and posterior tibial vein appear
patent.

Limited views of the contralateral common femoral vein are
unremarkable.

OTHER

None.

Limitations: none
IMPRESSION: Positive examination for acute deep venous thrombosis involving the
right femoral and popliteal veins with expansile noncompressible
clot demonstrated throughout.

Critical Value/emergent results were called by telephone at the time
of interpretation on [DATE] at [DATE] to provider KONGO
KONGO , who verbally acknowledged these results.

## 2021-02-18 IMAGING — CT CT ANGIO CHEST
2 of 7 series · 13 of 36 positions shown · IV contrast (omnipaque)
Comparison: Chest radiograph dated [DATE].

CLINICAL DATA: Concern for pulmonary embolism.

EXAM:
CT ANGIOGRAPHY CHEST WITH CONTRAST
TECHNIQUE: Multidetector CT imaging of the chest was performed using the
standard protocol during bolus administration of intravenous
contrast. Multiplanar CT image reconstructions and MIPs were
obtained to evaluate the vascular anatomy.
CONTRAST:  75mL OMNIPAQUE IOHEXOL 350 MG/ML SOLN

[Series 5: pe axial thins · axial · 0.66mm/px · z∈[-518,-276]mm · 12 of 286 slices shown]
[im 22/286  lung]
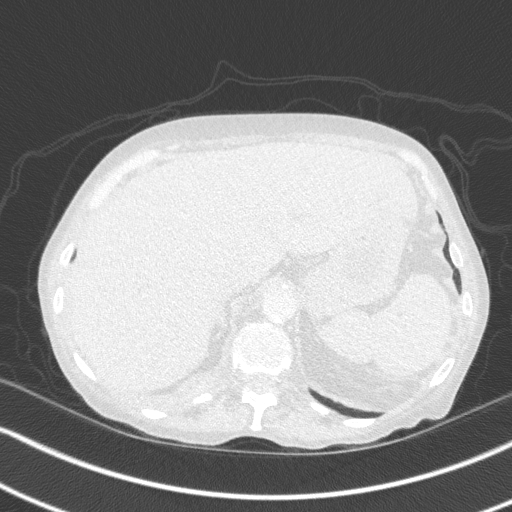
[im 44/286  mediastinal]
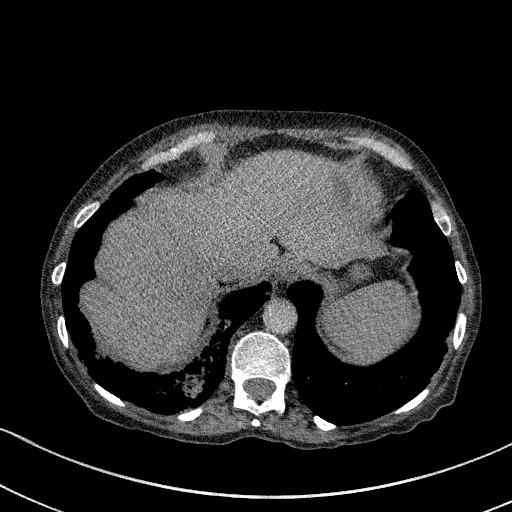
[im 66/286  lung]
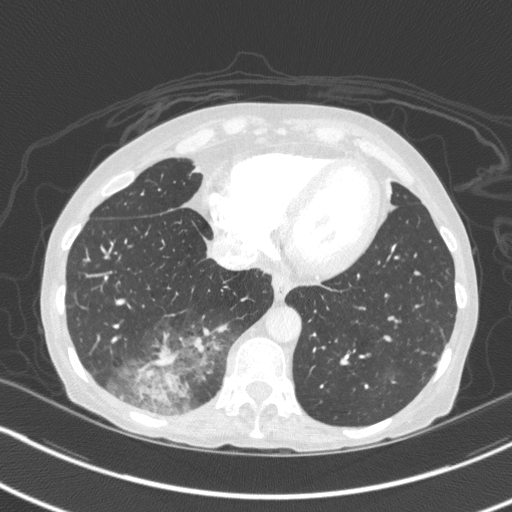
[im 88/286  mediastinal]
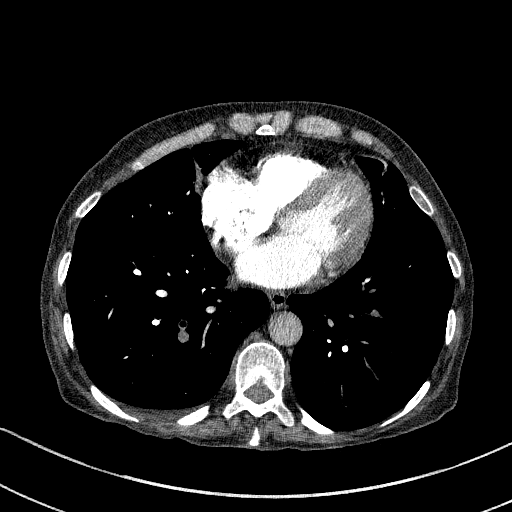
[im 110/286  lung]
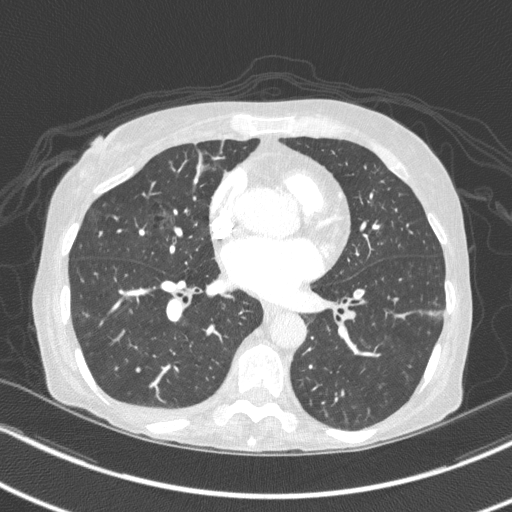
[im 132/286  mediastinal]
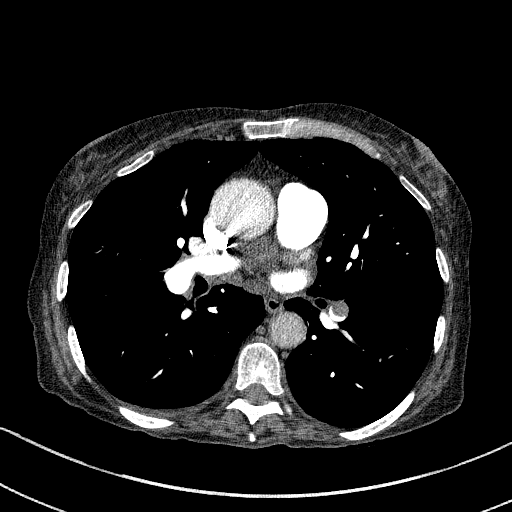
[im 154/286  lung]
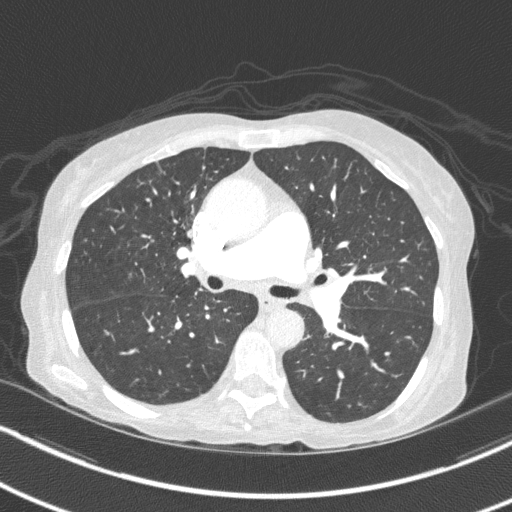
[im 176/286  mediastinal]
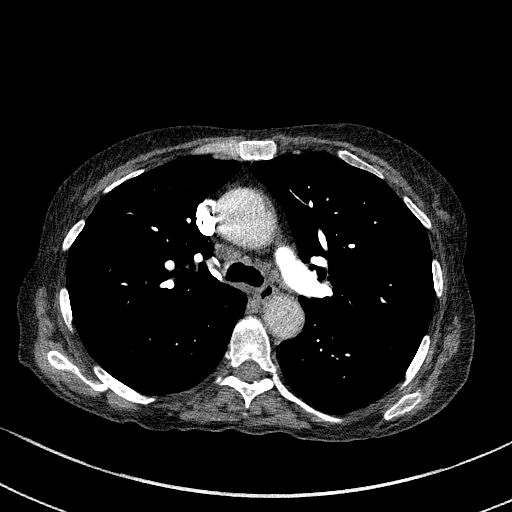
[im 198/286  lung]
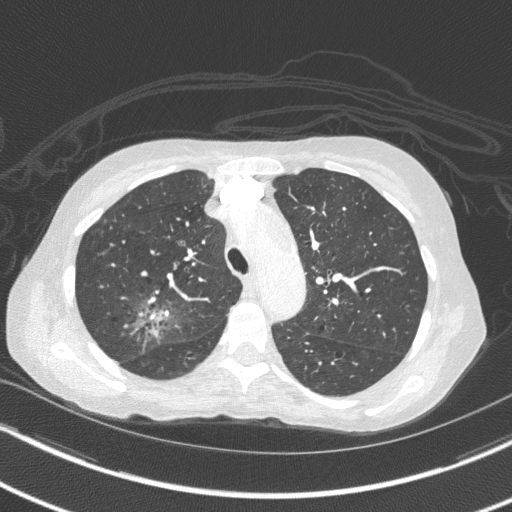
[im 220/286  mediastinal]
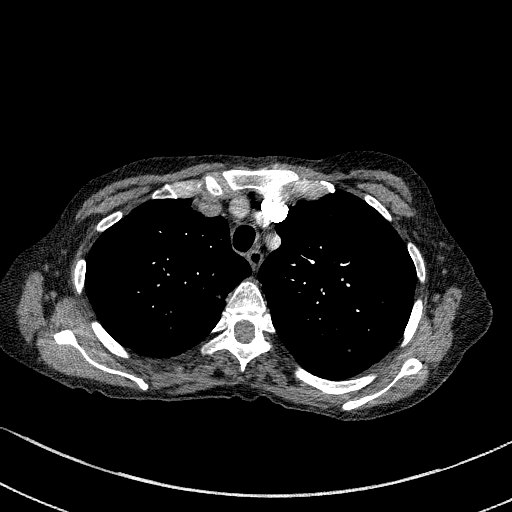
[im 242/286  lung]
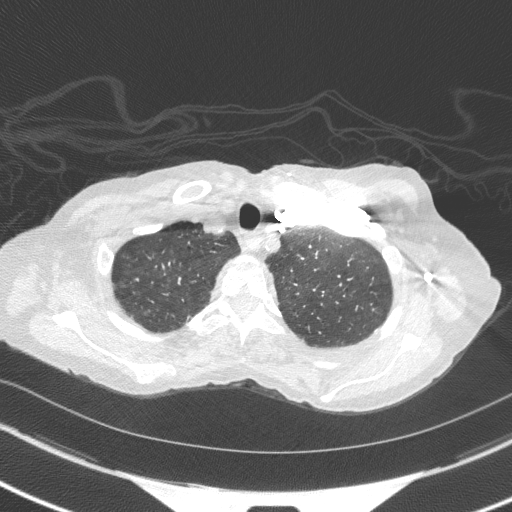
[im 264/286  mediastinal]
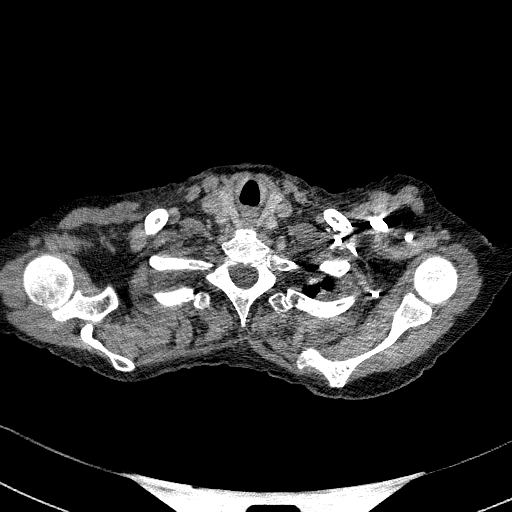

[Series 7: cor soft · coronal · 0.59mm/px · 1 of 119 slices shown]
[im 60/119  mediastinal]
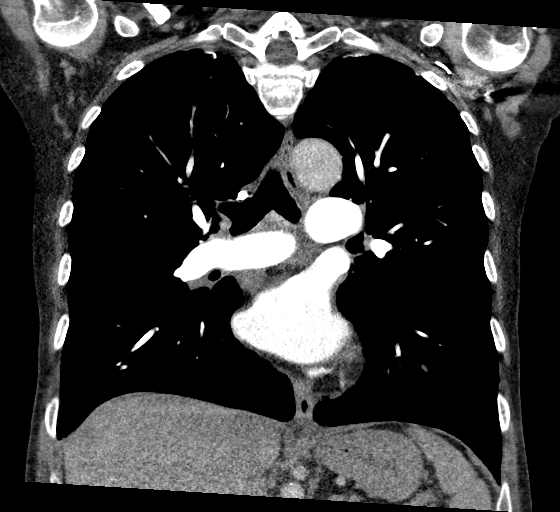

[13 of 36 positions shown; findings below may reference images not displayed]

FINDINGS: Cardiovascular: No cardiomegaly or pericardial effusion. There is
coronary vascular calcification of the LAD. Low attenuating area
along the left anterior wall of the proximal aortic arch likely
represents a noncalcified plaque or mural thrombus. Large lobar
pulmonary artery embolus involving the left lower lobe branch
extending into the segmental and subsegmental branches. Additionally
there is occlusive thrombus in the right lower lobe segmental
pulmonary artery branch. The right ventricular lumen measures 4.1 cm
and the left ventricular lumen measures 4.0 cm for a ratio of
approximately 1.0 consistent with right heart straining.

Mediastinum/Nodes: No hilar or mediastinal adenopathy. The esophagus
is grossly unremarkable. No mediastinal fluid collection.

Lungs/Pleura: Patchy area of ground-glass opacity involving the
right lung base may represent an area of infarct but pneumonia is
not excluded. Additionally there are scattered clusters of
ground-glass density throughout the lungs. There is a 1.7 x 3.3 cm
cluster of ground-glass density with somewhat irregular and
spiculated margins the right upper lobe along the fissure (47/6).
Findings may be infectious or inflammatory in etiology or represent
malignancy. Multidisciplinary consult is advised.

There is a 5 mm ground-glass nodule in the superior segment of the
right lower lobe (55/6). There is a small right pleural effusion. No
pneumothorax. The central airways are patent.

Upper Abdomen: No acute abnormality.

Musculoskeletal: Age indeterminate mild compression fracture of the
superior endplate of T9. Correlation with clinical exam and point
tenderness recommended. No other acute osseous pathology.

Review of the MIP images confirms the above findings.
IMPRESSION: 1. Bilateral lower lobe pulmonary artery emboli with CT evidence of
right heart straining (RV/LV Ratio = 1.0) consistent with at least
submassive (intermediate risk) PE. The presence of right heart
strain has been associated with an increased risk of morbidity and
mortality.
2. Right lower lobe ground-glass opacity may represent an area of
infarct or infiltrate.
3. Scattered clusters of ground-glass density throughout the lungs
with the largest in the right upper lobe demonstrating a somewhat
irregular or spiculated margins. Findings may be infectious or
inflammatory in etiology or represent malignancy. Multidisciplinary
consult is advised.
4. Small right pleural effusion.
5. Age indeterminate mild compression fracture of the superior
endplate of T9. Correlation with clinical exam and point tenderness
recommended.
6. Aortic Atherosclerosis ([N7]-[N7]).

These results were called by telephone at the time of interpretation
on [DATE] at [DATE] to provider PARTL , who verbally
acknowledged these results.

## 2021-02-18 MED ORDER — HEPARIN (PORCINE) 25000 UT/250ML-% IV SOLN
16.0000 [IU]/kg/h | INTRAVENOUS | Status: DC
Start: 2021-02-18 — End: 2021-02-18

## 2021-02-18 MED ORDER — HEPARIN (PORCINE) 25000 UT/250ML-% IV SOLN
1000.0000 [IU]/h | INTRAVENOUS | Status: DC
  Administered 2021-02-18: 23:00:00 850 [IU]/h via INTRAVENOUS
  Filled 2021-02-18 (×2): qty 250

## 2021-02-18 MED ORDER — IOHEXOL 350 MG/ML SOLN
75.0000 mL | Freq: Once | INTRAVENOUS | Status: AC | PRN
  Administered 2021-02-18: 22:00:00 75 mL via INTRAVENOUS

## 2021-02-18 MED ORDER — POTASSIUM CHLORIDE CRYS ER 20 MEQ PO TBCR
40.0000 meq | EXTENDED_RELEASE_TABLET | Freq: Once | ORAL | Status: AC
  Administered 2021-02-18: 22:00:00 40 meq via ORAL
  Filled 2021-02-18: qty 2

## 2021-02-18 MED ORDER — SODIUM CHLORIDE 0.9 % IV BOLUS
1000.0000 mL | Freq: Once | INTRAVENOUS | Status: AC
  Administered 2021-02-18: 21:00:00 1000 mL via INTRAVENOUS

## 2021-02-18 MED ORDER — FENTANYL CITRATE PF 50 MCG/ML IJ SOSY
50.0000 ug | PREFILLED_SYRINGE | Freq: Once | INTRAMUSCULAR | Status: AC
  Administered 2021-02-18: 23:00:00 50 ug via INTRAVENOUS
  Filled 2021-02-18: qty 1

## 2021-02-18 MED ORDER — HEPARIN BOLUS VIA INFUSION
3750.0000 [IU] | Freq: Once | INTRAVENOUS | Status: AC
  Administered 2021-02-18: 23:00:00 3750 [IU] via INTRAVENOUS

## 2021-02-18 MED ORDER — HEPARIN BOLUS VIA INFUSION: 5000.0000 [IU] | Freq: Once | INTRAVENOUS | Status: DC

## 2021-02-18 NOTE — Progress Notes (Signed)
ANTICOAGULATION CONSULT NOTE - Initial Consult  Pharmacy Consult for Heparin Indication: pulmonary embolus  Allergies  Allergen Reactions   Codeine Itching    Patient Measurements: Height: 5\' 3"  (160 cm) Weight: 47.2 kg (104 lb) IBW/kg (Calculated) : 52.4 Heparin Dosing Weight: 47.2 kg  Vital Signs: Temp: 98.2 F (36.8 C) (12/29 1640) Temp Source: Oral (12/29 1640) BP: 152/83 (12/29 2204) Pulse Rate: 86 (12/29 2204)  Labs: Recent Labs    02/18/21 2044  HGB 17.0*  HCT 48.1*  PLT 214  CREATININE 0.40*    Estimated Creatinine Clearance: 52.2 mL/min (A) (by C-G formula based on SCr of 0.4 mg/dL (L)).   Medical History: Past Medical History:  Diagnosis Date   Colon polyps    Hyperlipidemia    Rheumatic fever    Seasonal allergies    UTI (urinary tract infection)     Medications:  (Not in a hospital admission)  Scheduled:   heparin  3,750 Units Intravenous Once   Infusions:   heparin     PRN:   Assessment: 68 yof with Bilateral lower lobe pulmonary artery emboli w/ CT evidence of RHS. Heparin per pharmacy consult placed for pulmonary embolus.  Patient is not on anticoagulation prior to arrival.  Hgb 17; plt 214  Goal of Therapy:  Heparin level 0.3-0.7 units/ml Monitor platelets by anticoagulation protocol: Yes   Plan:  Give 3750 units bolus x 1 Start heparin infusion at 850 units/hr Check anti-Xa level in 6 hours and daily while on heparin Continue to monitor H&H and platelets  Lorelei Pont, PharmD, BCPS 02/18/2021 10:21 PM ED Clinical Pharmacist -  [phone]

## 2021-02-18 NOTE — ED Provider Notes (Signed)
[facility] EMERGENCY DEPT Provider Note   CSN: [id number] Arrival date & time: 02/18/21  1627     History Chief Complaint  Patient presents with   Leg Swelling   Cough    Sue Chase is a 65 y.o. female.  HPI  65 year old female with past medical history of HTN, HLD presents emergency department with right-sided chest pain, intermittently productive cough and right lower extremity swelling.  Patient states this is been going on for a couple weeks.  Her husband has recently been sick and she has not been able to get checked out.  She has intermittent shortness of breath but denies any hemoptysis.  Sometimes the chest pain radiates around to her back.  Denies any GI symptoms.  No previous DVT/PE.  Past Medical History:  Diagnosis Date   Colon polyps    Hyperlipidemia    Rheumatic fever    Seasonal allergies    UTI (urinary tract infection)     Patient Active Problem List   Diagnosis Date Noted   Lower extremity edema 06/29/2017   Hyperlipidemia 06/29/2017   Tobacco use 06/29/2017    Past Surgical History:  Procedure Laterality Date   SALIVARY GLAND SURGERY     LATE 80S   TUBAL LIGATION  1986     OB History   No obstetric history on file.     Family History  Problem Relation Age of Onset   Arthritis Mother    Diabetes Mother    Heart disease Mother    Hyperlipidemia Mother    Hypertension Mother    45 / [place] Mother    Hearing loss Father    Liver cancer Father    Lung cancer Father    Hyperlipidemia Father    Diabetes Brother    Lung disease Maternal Grandfather        Black Lung   Diabetes Paternal Grandmother    Cancer Paternal Grandfather    Diabetes Paternal Grandfather     Social History   Tobacco Use   Smoking status: Every Day    Packs/day: 1.00    Years: 40.00    Pack years: 40.00    Types: Cigarettes   Smokeless tobacco: Never  Vaping Use   Vaping Use: Never used  Substance Use Topics    Alcohol use: Yes    Comment: Rum and Coke/Unsure of amount   Drug use: Never    Home Medications Prior to Admission medications   Medication Sig Start Date End Date Taking? Authorizing Provider  acetaminophen (TYLENOL) 325 MG tablet Take 2 tablets (650 mg total) by mouth in the morning, at noon, in the evening, and at bedtime. 09/23/19   Mesner, Corene Cornea, MD  albuterol (VENTOLIN HFA) 108 (90 Base) MCG/ACT inhaler Inhale 2 puffs into the lungs every 6 (six) hours as needed for wheezing or shortness of breath. 02/17/21   Nafziger, Tommi Rumps, NP  ibuprofen (ADVIL) 400 MG tablet Take 400 mg by mouth every 6 (six) hours as needed.    [provider]  meloxicam (MOBIC) 15 MG tablet Take 1 tablet (15 mg total) by mouth daily. 07/09/20 07/09/21  Trula Slade, DPM  methocarbamol (ROBAXIN) 750 MG tablet Take 750 mg by mouth 4 (four) times daily.    Vallarie Mare, MD  omeprazole (PRILOSEC) 20 MG capsule Take 1 capsule (20 mg total) by mouth daily. 11/20/17   Nafziger, Tommi Rumps, NP  oxyCODONE 10 MG TABS Take 1 tablet (10 mg total) by mouth every 4 (four)  hours as needed for severe pain. 10/17/19   Nafziger, Tommi Rumps, NP  pravastatin (PRAVACHOL) 20 MG tablet TAKE ONE TABLET BY MOUTH DAILY 02/26/19   [name], NP    Allergies    Codeine  Review of Systems   Review of Systems  Constitutional:  Positive for appetite change, chills and fatigue. Negative for fever.  HENT:  Negative for congestion.   Eyes:  Negative for visual disturbance.  Respiratory:  Positive for cough and chest tightness. Negative for shortness of breath.   Cardiovascular:  Positive for chest pain and leg swelling. Negative for palpitations.  Gastrointestinal:  Negative for abdominal pain, diarrhea and vomiting.  Genitourinary:  Negative for dysuria.  Skin:  Negative for rash.  Neurological:  Negative for headaches.   Physical Exam Updated Vital Signs BP (!) 155/89    Pulse 93    Temp 98.2 F (36.8 C) (Oral)    Resp 19    Ht  5\' 3"  (1.6 m)    Wt 47.2 kg    SpO2 97%    BMI 18.42 kg/m   Physical Exam Vitals and nursing note reviewed.  Constitutional:      Appearance: Normal appearance. She is ill-appearing. She is not diaphoretic.  HENT:     Head: Normocephalic.     Mouth/Throat:     Mouth: Mucous membranes are moist.  Cardiovascular:     Rate and Rhythm: Tachycardia present.  Pulmonary:     Effort: Pulmonary effort is normal. No respiratory distress.  Abdominal:     Palpations: Abdomen is soft.     Tenderness: There is no abdominal tenderness.  Musculoskeletal:        General: Swelling present.     Comments: Right lower extremity swelling, no significant redness, warmth, equal peripheral pulses  Skin:    General: Skin is warm.  Neurological:     Mental Status: She is alert and oriented to person, place, and time. Mental status is at baseline.  Psychiatric:        Mood and Affect: Mood normal.    ED Results / Procedures / Treatments   Labs (all labs ordered are listed, but only abnormal results are displayed) Labs Reviewed  CBC WITH DIFFERENTIAL/PLATELET - Abnormal; Notable for the following components:      Result Value   WBC 11.1 (*)    Hemoglobin 17.0 (*)    HCT 48.1 (*)    MCV 112.9 (*)    MCH 39.9 (*)    Monocytes Absolute 1.3 (*)    All other components within normal limits  BASIC METABOLIC PANEL - Abnormal; Notable for the following components:   Potassium 2.5 (*)    Chloride 97 (*)    Creatinine, Ser 0.40 (*)    Calcium 8.8 (*)    All other components within normal limits  BRAIN NATRIURETIC PEPTIDE - Abnormal; Notable for the following components:   B Natriuretic Peptide 111.9 (*)    All other components within normal limits  RESP PANEL BY RT-PCR (FLU A&B, COVID) ARPGX2  PROTIME-INR  APTT  HEPARIN LEVEL (UNFRACTIONATED)  TROPONIN I (HIGH SENSITIVITY)  TROPONIN I (HIGH SENSITIVITY)    EKG None  Radiology DG Chest 2 View  Result Date: 02/18/2021 CLINICAL DATA:  Cough  EXAM: CHEST - 2 VIEW COMPARISON:  01/04/2019 FINDINGS: Cardiac size is within normal limits. Low position of diaphragms suggests COPD. New small patchy infiltrate is seen in the posterior right lower lung fields. Linear density in the right upper lung fields  has not changed significantly suggesting scarring. Linear density in the right middle lobe has not changed significantly, possibly suggesting scarring. There is no significant pleural effusion or pneumothorax. IMPRESSION: There is new patchy infiltrate in the posterior right lower lung fields suggesting atelectasis/pneumonia. Part of this finding may suggest underlying scarring. COPD. There is no pleural effusion or pneumothorax. Electronically Signed   By: Elmer Picker M.D.   On: 02/18/2021 17:32   CT Angio Chest PE W/Cm &/Or Wo Cm  Result Date: 02/18/2021 CLINICAL DATA:  Concern for pulmonary embolism. EXAM: CT ANGIOGRAPHY CHEST WITH CONTRAST TECHNIQUE: Multidetector CT imaging of the chest was performed using the standard protocol during bolus administration of intravenous contrast. Multiplanar CT image reconstructions and MIPs were obtained to evaluate the vascular anatomy. CONTRAST:  82mL OMNIPAQUE IOHEXOL 350 MG/ML SOLN COMPARISON:  Chest radiograph dated 02/18/2021. FINDINGS: Cardiovascular: No cardiomegaly or pericardial effusion. There is coronary vascular calcification of the LAD. Low attenuating area along the left anterior wall of the proximal aortic arch likely represents a noncalcified plaque or mural thrombus. Large lobar pulmonary artery embolus involving the left lower lobe branch extending into the segmental and subsegmental branches. Additionally there is occlusive thrombus in the right lower lobe segmental pulmonary artery branch. The right ventricular lumen measures 4.1 cm and the left ventricular lumen measures 4.0 cm for a ratio of approximately 1.0 consistent with right heart straining. Mediastinum/Nodes: No hilar or  mediastinal adenopathy. The esophagus is grossly unremarkable. No mediastinal fluid collection. Lungs/Pleura: Patchy area of ground-glass opacity involving the right lung base may represent an area of infarct but pneumonia is not excluded. Additionally there are scattered clusters of ground-glass density throughout the lungs. There is a 1.7 x 3.3 cm cluster of ground-glass density with somewhat irregular and spiculated margins the right upper lobe along the fissure (47/6). Findings may be infectious or inflammatory in etiology or represent malignancy. Multidisciplinary consult is advised. There is a 5 mm ground-glass nodule in the superior segment of the right lower lobe (55/6). There is a small right pleural effusion. No pneumothorax. The central airways are patent. Upper Abdomen: No acute abnormality. Musculoskeletal: Age indeterminate mild compression fracture of the superior endplate of T9. Correlation with clinical exam and point tenderness recommended. No other acute osseous pathology. Review of the MIP images confirms the above findings. IMPRESSION: 1. Bilateral lower lobe pulmonary artery emboli with CT evidence of right heart straining (RV/LV Ratio = 1.0) consistent with at least submassive (intermediate risk) PE. The presence of right heart strain has been associated with an increased risk of morbidity and mortality. 2. Right lower lobe ground-glass opacity may represent an area of infarct or infiltrate. 3. Scattered clusters of ground-glass density throughout the lungs with the largest in the right upper lobe demonstrating a somewhat irregular or spiculated margins. Findings may be infectious or inflammatory in etiology or represent malignancy. Multidisciplinary consult is advised. 4. Small right pleural effusion. 5. Age indeterminate mild compression fracture of the superior endplate of T9. Correlation with clinical exam and point tenderness recommended. 6. Aortic Atherosclerosis (ICD10-I70.0). These  results were called by telephone at the time of interpretation on 02/18/2021 at 10:07 pm to provider [facility] , who verbally acknowledged these results. Electronically Signed   By: Anner Crete M.D.   On: 02/18/2021 22:10   US Venous Img Lower Unilateral Right (DVT)  Result Date: 02/18/2021 CLINICAL DATA:  Right lower extremity swelling. EXAM: Right LOWER EXTREMITY VENOUS DOPPLER ULTRASOUND TECHNIQUE: Gray-scale sonography with compression, as well  as color and duplex ultrasound, were performed to evaluate the deep venous system(s) from the level of the common femoral vein through the popliteal and proximal calf veins. COMPARISON:  None. FINDINGS: VENOUS Echogenic filling defects are demonstrated in the right femoral and popliteal vein as well as at the saphenofemoral junction and muscular perforators. Involved vessels are noncompressible with lack of flow on color flow Doppler imaging. Changes are consistent with acute deep venous thrombosis. The right common femoral vein and posterior tibial vein appear patent. Limited views of the contralateral common femoral vein are unremarkable. OTHER None. Limitations: none IMPRESSION: Positive examination for acute deep venous thrombosis involving the right femoral and popliteal veins with expansile noncompressible clot demonstrated throughout. Critical Value/emergent results were called by telephone at the time of interpretation on 02/18/2021 at 8:24 pm to provider [facility] , who verbally acknowledged these results. Electronically Signed   By: Lucienne Capers M.D.   On: 02/18/2021 20:29    Procedures .Critical Care Performed by: Lorelle Gibbs, DO Authorized by: Lorelle Gibbs, DO   Critical care provider statement:    Critical care time (minutes):  30   Critical care time was exclusive of:  Separately billable procedures and treating other patients   Critical care was necessary to treat or prevent imminent or life-threatening  deterioration of the following conditions:  Respiratory failure and cardiac failure   Critical care was time spent personally by me on the following activities:  Development of treatment plan with patient or surrogate, discussions with consultants, evaluation of patient's response to treatment, examination of patient, ordering and review of laboratory studies, ordering and review of radiographic studies, ordering and performing treatments and interventions, pulse oximetry, re-evaluation of patient's condition and review of old charts   I assumed direction of critical care for this patient from another provider in my specialty: no     Care discussed with: admitting provider     Medications Ordered in ED Medications  heparin ADULT infusion 100 units/mL (25000 units/21mL) (850 Units/hr Intravenous New Bag/Given 02/18/21 2302)  sodium chloride 0.9 % bolus 1,000 mL (0 mLs Intravenous Stopped 02/18/21 2250)  potassium chloride SA (KLOR-CON M) CR tablet 40 mEq (40 mEq Oral Given 02/18/21 2204)  iohexol (OMNIPAQUE) 350 MG/ML injection 75 mL (75 mLs Intravenous Contrast Given 02/18/21 2149)  heparin bolus via infusion 3,750 Units (3,750 Units Intravenous Bolus from Bag 02/18/21 2302)  fentaNYL (SUBLIMAZE) injection 50 mcg (50 mcg Intravenous Given 02/18/21 2250)    ED Course  I have reviewed the triage vital signs and the nursing notes.  Pertinent labs & imaging results that were available during my care of the patient were reviewed by me and considered in my medical decision making (see chart for details).    MDM Rules/Calculators/A&P                          65 year old female presents the emergency department with ongoing right-sided chest pain, shortness of breath/cough, right lower extremity swelling.  Tachycardic on arrival.  On room air, no hypoxia, no respiratory distress.  Initial blood work was reassuring, chest x-ray showed what appeared to be a right-sided pneumonia, however ultrasound  of the right lower extremity was positive for DVT.  For this reason blood work was added on a CT PE study was done.  This is significant for bilateral PE with right heart strain.  Troponin is negative, BNP is slightly elevated, no overt findings of heart failure.  She has normal respiratory rate, normal oxygenation on room air, no distress.  Plan to start heparin and admit.  Patients evaluation and results requires admission for further treatment and care. Patient agrees with admission plan, offers no new complaints and is stable/unchanged at time of admit.     Final Clinical Impression(s) / ED Diagnoses Final diagnoses:  Right leg swelling    Rx / DC Orders ED Discharge Orders     None        Lorelle Gibbs, DO 02/18/21 2340

## 2021-02-18 NOTE — Telephone Encounter (Signed)
--  Caller states she is having right leg swelling from the knee down and cold symptoms that started this week-cough, runny nose and sore throat; no fever. Her leg pain started around Thanksgiving. No fever.   02/18/2021 8:50:53 AM Go to ED Now (or PCP triage) Yes D'Heur Lucia Gaskins, RN, Adrienne  Comments User: Vincente Liberty, D'Heur Lucia Gaskins, RN Date/Time Sue Chase Time): 02/18/2021 8:42:40 AM Swelling is in foot, ankle, up to the knee.  User: Vincente Liberty, D'Heur Lucia Gaskins, RN Date/Time Sue Chase Time): 02/18/2021 8:43:23 AM Patient's swelling is pitting.  User: Vincente Liberty, D'Heur Lucia Gaskins, RN Date/Time Sue Chase Time): 02/18/2021 8:44:45 AM Caller states her MD called in inhaler yesterday.  User: Vincente Liberty, D'Heur Lucia Gaskins, RN Date/Time Sue Chase Time): 02/18/2021 8:50:51 AM Caller states she cannot go to ED. Husband has pancreatic cancer, is in the hospital, & she has to go see him.  Referrals GO TO FACILITY REFUSED

## 2021-02-18 NOTE — ED Triage Notes (Signed)
Rt leg swelling started to swell on Thanksgiving, also developed a cough this week, some productive secretions at times.

## 2021-02-18 NOTE — ED Notes (Signed)
Patient transported to CT 

## 2021-02-19 ENCOUNTER — Inpatient Hospital Stay (HOSPITAL_COMMUNITY): Payer: BC Managed Care – PPO

## 2021-02-19 ENCOUNTER — Telehealth: Payer: Self-pay | Admitting: Internal Medicine

## 2021-02-19 DIAGNOSIS — E876 Hypokalemia: Secondary | ICD-10-CM | POA: Diagnosis present

## 2021-02-19 DIAGNOSIS — Z885 Allergy status to narcotic agent status: Secondary | ICD-10-CM | POA: Diagnosis not present

## 2021-02-19 DIAGNOSIS — J302 Other seasonal allergic rhinitis: Secondary | ICD-10-CM | POA: Diagnosis present

## 2021-02-19 DIAGNOSIS — Z8261 Family history of arthritis: Secondary | ICD-10-CM | POA: Diagnosis not present

## 2021-02-19 DIAGNOSIS — I824Y1 Acute embolism and thrombosis of unspecified deep veins of right proximal lower extremity: Secondary | ICD-10-CM | POA: Diagnosis not present

## 2021-02-19 DIAGNOSIS — I2699 Other pulmonary embolism without acute cor pulmonale: Secondary | ICD-10-CM | POA: Diagnosis not present

## 2021-02-19 DIAGNOSIS — Z20822 Contact with and (suspected) exposure to covid-19: Secondary | ICD-10-CM | POA: Diagnosis present

## 2021-02-19 DIAGNOSIS — E785 Hyperlipidemia, unspecified: Secondary | ICD-10-CM | POA: Diagnosis present

## 2021-02-19 DIAGNOSIS — Z833 Family history of diabetes mellitus: Secondary | ICD-10-CM | POA: Diagnosis not present

## 2021-02-19 DIAGNOSIS — I10 Essential (primary) hypertension: Secondary | ICD-10-CM | POA: Diagnosis present

## 2021-02-19 DIAGNOSIS — I2602 Saddle embolus of pulmonary artery with acute cor pulmonale: Secondary | ICD-10-CM | POA: Diagnosis not present

## 2021-02-19 DIAGNOSIS — Z79899 Other long term (current) drug therapy: Secondary | ICD-10-CM | POA: Diagnosis not present

## 2021-02-19 DIAGNOSIS — Z86711 Personal history of pulmonary embolism: Secondary | ICD-10-CM | POA: Diagnosis present

## 2021-02-19 DIAGNOSIS — D72829 Elevated white blood cell count, unspecified: Secondary | ICD-10-CM | POA: Diagnosis not present

## 2021-02-19 DIAGNOSIS — E871 Hypo-osmolality and hyponatremia: Secondary | ICD-10-CM | POA: Diagnosis not present

## 2021-02-19 DIAGNOSIS — I82401 Acute embolism and thrombosis of unspecified deep veins of right lower extremity: Secondary | ICD-10-CM | POA: Diagnosis present

## 2021-02-19 DIAGNOSIS — F17210 Nicotine dependence, cigarettes, uncomplicated: Secondary | ICD-10-CM | POA: Diagnosis present

## 2021-02-19 DIAGNOSIS — Z791 Long term (current) use of non-steroidal anti-inflammatories (NSAID): Secondary | ICD-10-CM | POA: Diagnosis not present

## 2021-02-19 DIAGNOSIS — M7989 Other specified soft tissue disorders: Secondary | ICD-10-CM | POA: Diagnosis not present

## 2021-02-19 DIAGNOSIS — Z8249 Family history of ischemic heart disease and other diseases of the circulatory system: Secondary | ICD-10-CM | POA: Diagnosis not present

## 2021-02-19 DIAGNOSIS — Z83438 Family history of other disorder of lipoprotein metabolism and other lipidemia: Secondary | ICD-10-CM | POA: Diagnosis not present

## 2021-02-19 LAB — HEPARIN LEVEL (UNFRACTIONATED)
Heparin Unfractionated: <0.1 IU/mL — ABNORMAL LOW (ref 0.30–0.70)
Heparin Unfractionated: <0.1 IU/mL — ABNORMAL LOW (ref 0.30–0.70)
Heparin Unfractionated: <0.1 IU/mL — ABNORMAL LOW (ref 0.30–0.70)

## 2021-02-19 LAB — RESPIRATORY PANEL BY PCR

## 2021-02-19 LAB — CBC WITH DIFFERENTIAL/PLATELET
Abs Immature Granulocytes: 0.03 10*3/uL (ref 0.00–0.07)
Basophils Absolute: 0 10*3/uL (ref 0.0–0.1)
Basophils Relative: 0 %
Eosinophils Absolute: 0.2 10*3/uL (ref 0.0–0.5)
Eosinophils Relative: 2 %
HCT: 47.8 % — ABNORMAL HIGH (ref 36.0–46.0)
Hemoglobin: 17.1 g/dL — ABNORMAL HIGH (ref 12.0–15.0)
Immature Granulocytes: 0 %
Lymphocytes Relative: 22 %
Lymphs Abs: 2.4 10*3/uL (ref 0.7–4.0)
MCH: 40 pg — ABNORMAL HIGH (ref 26.0–34.0)
MCHC: 35.8 g/dL (ref 30.0–36.0)
MCV: 111.9 fL — ABNORMAL HIGH (ref 80.0–100.0)
Monocytes Absolute: 1.2 10*3/uL — ABNORMAL HIGH (ref 0.1–1.0)
Monocytes Relative: 11 %
Neutro Abs: 7.1 10*3/uL (ref 1.7–7.7)
Neutrophils Relative %: 65 %
Platelets: 203 10*3/uL (ref 150–400)
RBC: 4.27 MIL/uL (ref 3.87–5.11)
RDW: 15 % (ref 11.5–15.5)
WBC: 10.9 10*3/uL — ABNORMAL HIGH (ref 4.0–10.5)
nRBC: 0 % (ref 0.0–0.2)

## 2021-02-19 LAB — COMPREHENSIVE METABOLIC PANEL
ALT: 11 U/L (ref 0–44)
AST: 17 U/L (ref 15–41)
Albumin: 2.9 g/dL — ABNORMAL LOW (ref 3.5–5.0)
Alkaline Phosphatase: 185 U/L — ABNORMAL HIGH (ref 38–126)
Anion gap: 9 (ref 5–15)
BUN: 9 mg/dL (ref 8–23)
CO2: 24 mmol/L (ref 22–32)
Calcium: 8 mg/dL — ABNORMAL LOW (ref 8.9–10.3)
Chloride: 99 mmol/L (ref 98–111)
Creatinine, Ser: 0.42 mg/dL — ABNORMAL LOW (ref 0.44–1.00)
GFR, Estimated: >60 mL/min (ref >60)
Glucose, Bld: 107 mg/dL — ABNORMAL HIGH (ref 70–99)
Potassium: 3 mmol/L — ABNORMAL LOW (ref 3.5–5.1)
Sodium: 132 mmol/L — ABNORMAL LOW (ref 135–145)
Total Bilirubin: 1.8 mg/dL — ABNORMAL HIGH (ref 0.3–1.2)
Total Protein: 6.8 g/dL (ref 6.5–8.1)

## 2021-02-19 LAB — HIV ANTIBODY (ROUTINE TESTING W REFLEX): HIV Screen 4th Generation wRfx: NONREACTIVE

## 2021-02-19 LAB — STREP PNEUMONIAE URINARY ANTIGEN: Strep Pneumo Urinary Antigen: NEGATIVE

## 2021-02-19 LAB — EXPECTORATED SPUTUM ASSESSMENT W GRAM STAIN, RFLX TO RESP C

## 2021-02-19 LAB — TROPONIN I (HIGH SENSITIVITY): Troponin I (High Sensitivity): 4 ng/L (ref <18)

## 2021-02-19 LAB — ECHOCARDIOGRAM COMPLETE
Height: 63 in
S' Lateral: 3.45 cm
Weight: 1664 oz

## 2021-02-19 LAB — MAGNESIUM: Magnesium: 1.4 mg/dL — ABNORMAL LOW (ref 1.7–2.4)

## 2021-02-19 MED ORDER — ONDANSETRON HCL 4 MG/2ML IJ SOLN: 4.0000 mg | Freq: Four times a day (QID) | INTRAMUSCULAR | Status: DC | PRN

## 2021-02-19 MED ORDER — ACETAMINOPHEN 650 MG RE SUPP: 650.0000 mg | Freq: Four times a day (QID) | RECTAL | Status: DC | PRN

## 2021-02-19 MED ORDER — FENTANYL CITRATE PF 50 MCG/ML IJ SOSY
50.0000 ug | PREFILLED_SYRINGE | Freq: Once | INTRAMUSCULAR | Status: AC
  Administered 2021-02-19: 03:00:00 50 ug via INTRAVENOUS
  Filled 2021-02-19: qty 1

## 2021-02-19 MED ORDER — OXYCODONE-ACETAMINOPHEN 7.5-325 MG PO TABS
1.0000 | ORAL_TABLET | Freq: Four times a day (QID) | ORAL | Status: DC | PRN
  Administered 2021-02-19 – 2021-02-21 (×8): 1 via ORAL
  Filled 2021-02-19 (×8): qty 1

## 2021-02-19 MED ORDER — SODIUM CHLORIDE 0.9 % IV SOLN: INTRAVENOUS | Status: DC

## 2021-02-19 MED ORDER — HEPARIN (PORCINE) 25000 UT/250ML-% IV SOLN
1300.0000 [IU]/h | INTRAVENOUS | Status: AC
  Administered 2021-02-19: 18:00:00 1150 [IU]/h via INTRAVENOUS
  Filled 2021-02-19 (×2): qty 250

## 2021-02-19 MED ORDER — ACETAMINOPHEN 325 MG PO TABS
650.0000 mg | ORAL_TABLET | Freq: Four times a day (QID) | ORAL | Status: DC | PRN
  Administered 2021-02-19: 15:00:00 650 mg via ORAL
  Filled 2021-02-19: qty 2

## 2021-02-19 MED ORDER — POTASSIUM CHLORIDE 10 MEQ/100ML IV SOLN
10.0000 meq | INTRAVENOUS | Status: AC
  Administered 2021-02-19 (×4): 10 meq via INTRAVENOUS
  Filled 2021-02-19 (×3): qty 100

## 2021-02-19 MED ORDER — HEPARIN BOLUS VIA INFUSION
2000.0000 [IU] | Freq: Once | INTRAVENOUS | Status: AC
  Administered 2021-02-19: 18:00:00 2000 [IU] via INTRAVENOUS
  Filled 2021-02-19: qty 2000

## 2021-02-19 MED ORDER — OXYCODONE-ACETAMINOPHEN 5-325 MG PO TABS
1.0000 | ORAL_TABLET | Freq: Once | ORAL | Status: AC
  Administered 2021-02-19: 08:00:00 1 via ORAL
  Filled 2021-02-19: qty 1

## 2021-02-19 MED ORDER — ALBUTEROL SULFATE (2.5 MG/3ML) 0.083% IN NEBU
3.0000 mL | INHALATION_SOLUTION | Freq: Four times a day (QID) | RESPIRATORY_TRACT | Status: DC | PRN
  Administered 2021-02-20: 3 mL via RESPIRATORY_TRACT
  Filled 2021-02-19: qty 3

## 2021-02-19 MED ORDER — NICOTINE 14 MG/24HR TD PT24
14.0000 mg | MEDICATED_PATCH | Freq: Every day | TRANSDERMAL | Status: DC
  Administered 2021-02-19 – 2021-02-21 (×3): 14 mg via TRANSDERMAL
  Filled 2021-02-19 (×3): qty 1

## 2021-02-19 MED ORDER — HEPARIN BOLUS VIA INFUSION
3750.0000 [IU] | Freq: Once | INTRAVENOUS | Status: AC
  Administered 2021-02-19: 08:00:00 3750 [IU] via INTRAVENOUS

## 2021-02-19 MED ORDER — ONDANSETRON HCL 4 MG PO TABS: 4.0000 mg | ORAL_TABLET | Freq: Four times a day (QID) | ORAL | Status: DC | PRN

## 2021-02-19 MED ORDER — MAGNESIUM SULFATE 4 GM/100ML IV SOLN
4.0000 g | Freq: Once | INTRAVENOUS | Status: AC
Start: 2021-02-19 — End: 2021-02-19
  Administered 2021-02-19: 19:00:00 4 g via INTRAVENOUS
  Filled 2021-02-19: qty 100

## 2021-02-19 NOTE — ED Notes (Addendum)
Pt given peanut butter crackers and sprite per MD. Updated pt on ED to ED transfer.

## 2021-02-19 NOTE — Progress Notes (Signed)
Echocardiogram 2D Echocardiogram has been performed.  Oneal Deputy Coulson Wehner RDCS 02/19/2021, 1:44 PM

## 2021-02-19 NOTE — Progress Notes (Signed)
ANTICOAGULATION CONSULT NOTE   Pharmacy Consult for Heparin Indication: pulmonary embolus  Allergies  Allergen Reactions   Codeine Itching    Patient Measurements: Height: 5\' 3"  (160 cm) Weight: 47.2 kg (104 lb) IBW/kg (Calculated) : 52.4 Heparin Dosing Weight: 47.2 kg  Vital Signs: BP: 123/67 (12/30 0600) Pulse Rate: 74 (12/30 0600)  Labs: Recent Labs    02/18/21 2044 02/18/21 2220 02/18/21 2248 02/19/21 0553  HGB 17.0*  --   --   --   HCT 48.1*  --   --   --   PLT 214  --   --   --   APTT  --  31  --   --   LABPROT  --  12.9  --   --   INR  --  1.0  --   --   HEPARINUNFRC  --   --   --  <0.10*  CREATININE 0.40*  --   --   --   TROPONINIHS 5  --  5  --      Estimated Creatinine Clearance: 52.2 mL/min (A) (by C-G formula based on SCr of 0.4 mg/dL (L)).   Medical History: Past Medical History:  Diagnosis Date   Colon polyps    Hyperlipidemia    Rheumatic fever    Seasonal allergies    UTI (urinary tract infection)     Medications:  (Not in a hospital admission)  Scheduled:    Infusions:   heparin 850 Units/hr (02/19/21 0723)   PRN:   Assessment: 59 yof with Bilateral lower lobe pulmonary artery emboli w/ CT evidence of RHS. Heparin per pharmacy consult placed for pulmonary embolus.  Patient is not on anticoagulation prior to arrival.  Initial heparin level is undetectable, RN no infusion issues reported overnight, no current issues.  Goal of Therapy:  Heparin level 0.3-0.7 units/ml Monitor platelets by anticoagulation protocol: Yes   Plan:  Repeat 3750 units bolus x 1, and increase gtt to 1000 units/hr F/u 6 hour heparin level  Bertis Ruddy, PharmD Clinical Pharmacist ED Pharmacist Phone # [phone] 02/19/2021 7:30 AM

## 2021-02-19 NOTE — Progress Notes (Addendum)
ANTICOAGULATION CONSULT NOTE   Pharmacy Consult for Heparin Indication: pulmonary embolus  Allergies  Allergen Reactions   Codeine Itching    Patient Measurements: Height: 5\' 3"  (160 cm) Weight: 47.2 kg (104 lb) IBW/kg (Calculated) : 52.4 Heparin Dosing Weight: 47.2 kg  Vital Signs: Temp: 98.4 F (36.9 C) (12/30 1545) Temp Source: Oral (12/30 1545) BP: 158/92 (12/30 1545) Pulse Rate: 84 (12/30 1545)  Labs: Recent Labs    02/18/21 2044 02/18/21 2220 02/18/21 2248 02/19/21 0553 02/19/21 0732 02/19/21 1408 02/19/21 1449  HGB 17.0*  --   --   --   --  17.1*  --   HCT 48.1*  --   --   --   --  47.8*  --   PLT 214  --   --   --   --  203  --   APTT  --  31  --   --   --   --   --   LABPROT  --  12.9  --   --   --   --   --   INR  --  1.0  --   --   --   --   --   HEPARINUNFRC  --   --   --  <0.10*  --  <0.10* <0.10*  CREATININE 0.40*  --   --   --   --  0.42*  --   TROPONINIHS 5  --  5  --  4  --   --      Estimated Creatinine Clearance: 52.2 mL/min (A) (by C-G formula based on SCr of 0.42 mg/dL (L)).   Medical History: Past Medical History:  Diagnosis Date   Colon polyps    Hyperlipidemia    Rheumatic fever    Seasonal allergies    UTI (urinary tract infection)     Medications:  Medications Prior to Admission  Medication Sig Dispense Refill Last Dose   acetaminophen (TYLENOL) 325 MG tablet Take 2 tablets (650 mg total) by mouth in the morning, at noon, in the evening, and at bedtime. 30 tablet 0 Past Week   ibuprofen (ADVIL) 400 MG tablet Take 400 mg by mouth every 6 (six) hours as needed.   unk   albuterol (VENTOLIN HFA) 108 (90 Base) MCG/ACT inhaler Inhale 2 puffs into the lungs every 6 (six) hours as needed for wheezing or shortness of breath. (Patient not taking: Reported on 02/19/2021) 8 g 1 Not Taking   omeprazole (PRILOSEC) 20 MG capsule Take 1 capsule (20 mg total) by mouth daily. (Patient not taking: Reported on 02/19/2021) 30 capsule 3 Not Taking     Scheduled:   nicotine  14 mg Transdermal Daily    Infusions:   sodium chloride 100 mL/hr at 02/19/21 1342   heparin 1,000 Units/hr (02/19/21 0733)   potassium chloride 10 mEq (02/19/21 1558)   PRN:   Assessment: 20 yof with Bilateral lower lobe pulmonary artery emboli w/ CT evidence of RHS. Heparin per pharmacy consult placed for pulmonary embolus.  Patient is not on anticoagulation prior to arrival.  2nd heparin level undetectable with heparin infusing at 1000 units/hr, no infusion issues reported per RN, no current issues, and no bleeding.  Goal of Therapy:  Heparin level 0.3-0.7 units/ml Monitor platelets by anticoagulation protocol: Yes   Plan:  Heparin 2000 units IV bolus x 1, and increase heparin infusion to 1150 units/hr F/u 6 hour heparin level Monitor daily heparin level, CBC, signs/symptoms of bleeding  Royetta Asal, PharmD, BCPS Clinical Pharmacist [place] Please utilize Amion for appropriate phone number to reach the unit pharmacist ([place]) 02/19/2021 4:34 PM

## 2021-02-19 NOTE — ED Provider Notes (Signed)
Patient was accepted for ED to ED transfer to [facility] given the prolonged boarding process.  Accepted by Dr Armandina Gemma EDP, with plan to page hospitalist on arrival, as patient has been admitted to hospitalist service.  She remained stable in terms of blood pressure, no evidence of cardiogenic shock.   Wyvonnia Dusky, MD 02/19/21 [phone]

## 2021-02-19 NOTE — Plan of Care (Signed)
  Problem: Clinical Measurements: Goal: Ability to maintain clinical measurements within normal limits will improve Outcome: Progressing   Problem: Clinical Measurements: Goal: Will remain free from infection Outcome: Progressing   Problem: Clinical Measurements: Goal: Diagnostic test results will improve Outcome: Progressing   

## 2021-02-19 NOTE — H&P (Signed)
History and Physical    Sue Chase OZH:[id number] DOB: [birth year] DOA: 02/18/2021  PCP: [name], NP  Patient coming from: Home  Chief Complaint: leg swelling  HPI: Sue Chase is a 64 y.o. female with medical history significant of tobacco abuse. Presenting with right leg swelling. Her symptoms started about a month ago. She noticed some leg swelling but no pain. It wasn't associated with any activity. She didn't try any medications for it. She didn't have it checked out d/t being busy with family issues. She denies any shortness of breath or chest pain during this time. She reports that she finally came to the ED because her daughter and her PCP told her that she needs to get her leg checked out. She denies any other aggravating or alleviating factors.    ED Course: RLE doppler was positive for DVT. CTA chest was positive of b/l PE. She was started on heparin gtt. [facility] was called for admission.   Review of Systems:  Review of systems is otherwise negative for all not mentioned in HPI.   PMHx Past Medical History:  Diagnosis Date   Colon polyps    Hyperlipidemia    Rheumatic fever    Seasonal allergies    UTI (urinary tract infection)     PSHx Past Surgical History:  Procedure Laterality Date   SALIVARY GLAND SURGERY     LATE 80S   TUBAL LIGATION  1986    SocHx  reports that she has been smoking. She has a 40.00 pack-year smoking history. She has never used smokeless tobacco. She reports current alcohol use. She reports that she does not use drugs.  Allergies  Allergen Reactions   Codeine Itching    FamHx Family History  Problem Relation Age of Onset   Arthritis Mother    Diabetes Mother    Heart disease Mother    Hyperlipidemia Mother    Hypertension Mother    Miscarriages / [place] Mother    Hearing loss Father    Liver cancer Father    Lung cancer Father    Hyperlipidemia Father    Diabetes Brother    Lung disease Maternal  Grandfather        Black Lung   Diabetes Paternal Grandmother    Cancer Paternal Grandfather    Diabetes Paternal Grandfather     Prior to Admission medications   Medication Sig Start Date End Date Taking? Authorizing Provider  acetaminophen (TYLENOL) 325 MG tablet Take 2 tablets (650 mg total) by mouth in the morning, at noon, in the evening, and at bedtime. 09/23/19   Mesner, Corene Cornea, MD  albuterol (VENTOLIN HFA) 108 (90 Base) MCG/ACT inhaler Inhale 2 puffs into the lungs every 6 (six) hours as needed for wheezing or shortness of breath. 02/17/21   Nafziger, Tommi Rumps, NP  ibuprofen (ADVIL) 400 MG tablet Take 400 mg by mouth every 6 (six) hours as needed.    [provider]  meloxicam (MOBIC) 15 MG tablet Take 1 tablet (15 mg total) by mouth daily. 07/09/20 07/09/21  Trula Slade, DPM  methocarbamol (ROBAXIN) 750 MG tablet Take 750 mg by mouth 4 (four) times daily.    Vallarie Mare, MD  omeprazole (PRILOSEC) 20 MG capsule Take 1 capsule (20 mg total) by mouth daily. 11/20/17   Nafziger, Tommi Rumps, NP  oxyCODONE 10 MG TABS Take 1 tablet (10 mg total) by mouth every 4 (four) hours as needed for severe pain. 10/17/19   [name], NP  pravastatin (  PRAVACHOL) 10 MG tablet Take 10 mg by mouth daily.    [provider]  pravastatin (PRAVACHOL) 20 MG tablet TAKE ONE TABLET BY MOUTH DAILY 02/26/19   [name], NP    Physical Exam: Vitals:   02/19/21 0600 02/19/21 0815 02/19/21 0900 02/19/21 1149  BP: 123/67 (!) 143/72 (!) 152/77 129/89  Pulse: 74 93 68 92  Resp: 16 13 18 16   Temp:    98.4 F (36.9 C)  TempSrc:    Oral  SpO2: 94% 94% 96% 96%  Weight:      Height:        General: 65 y.o. female resting in bed in NAD Eyes: PERRL, normal sclera ENMT: Nares patent w/o discharge, orophaynx clear, dentition normal, ears w/o discharge/lesions/ulcers Neck: Supple, trachea midline Cardiovascular: RRR, +S1, S2, no m/g/r, equal pulses throughout Respiratory: CTABL, no w/r/r,  normal WOB GI: BS+, NDNT, no masses noted, no organomegaly noted MSK: No c/c; 2+ RLE edema Neuro: A&O x 3, no focal deficits Psyc: Appropriate interaction and affect, calm/cooperative  Labs on Admission: I have personally reviewed following labs and imaging studies  CBC: Recent Labs  Lab 02/18/21 2044  WBC 11.1*  NEUTROABS 7.2  HGB 17.0*  HCT 48.1*  MCV 112.9*  PLT 295   Basic Metabolic Panel: Recent Labs  Lab 02/18/21 2044  NA 135  K 2.5*  CL 97*  CO2 29  GLUCOSE 88  BUN 12  CREATININE 0.40*  CALCIUM 8.8*   GFR: Estimated Creatinine Clearance: 52.2 mL/min (A) (by C-G formula based on SCr of 0.4 mg/dL (L)). Liver Function Tests: No results for input(s): AST, ALT, ALKPHOS, BILITOT, PROT, ALBUMIN in the last 168 hours. No results for input(s): LIPASE, AMYLASE in the last 168 hours. No results for input(s): AMMONIA in the last 168 hours. Coagulation Profile: Recent Labs  Lab 02/18/21 2220  INR 1.0   Cardiac Enzymes: No results for input(s): CKTOTAL, CKMB, CKMBINDEX, TROPONINI in the last 168 hours. BNP (last 3 results) No results for input(s): PROBNP in the last 8760 hours. HbA1C: No results for input(s): HGBA1C in the last 72 hours. CBG: No results for input(s): GLUCAP in the last 168 hours. Lipid Profile: No results for input(s): CHOL, HDL, LDLCALC, TRIG, CHOLHDL, LDLDIRECT in the last 72 hours. Thyroid Function Tests: No results for input(s): TSH, T4TOTAL, FREET4, T3FREE, THYROIDAB in the last 72 hours. Anemia Panel: No results for input(s): VITAMINB12, FOLATE, FERRITIN, TIBC, IRON, RETICCTPCT in the last 72 hours. Urine analysis:    Component Value Date/Time   COLORURINE AMBER (A) 09/23/2019 1746   APPEARANCEUR CLEAR 09/23/2019 1746   LABSPEC 1.025 09/23/2019 1746   PHURINE 7.0 09/23/2019 1746   GLUCOSEU NEGATIVE 09/23/2019 1746   HGBUR NEGATIVE 09/23/2019 1746   BILIRUBINUR SMALL (A) 09/23/2019 1746   BILIRUBINUR n 06/29/2017 1143   KETONESUR 5  (A) 09/23/2019 1746   PROTEINUR 30 (A) 09/23/2019 1746   UROBILINOGEN 0.2 06/29/2017 1143   NITRITE NEGATIVE 09/23/2019 1746   LEUKOCYTESUR NEGATIVE 09/23/2019 1746    Radiological Exams on Admission: DG Chest 2 View  Result Date: 02/18/2021 CLINICAL DATA:  Cough EXAM: CHEST - 2 VIEW COMPARISON:  01/04/2019 FINDINGS: Cardiac size is within normal limits. Low position of diaphragms suggests COPD. New small patchy infiltrate is seen in the posterior right lower lung fields. Linear density in the right upper lung fields has not changed significantly suggesting scarring. Linear density in the right middle lobe has not changed significantly, possibly suggesting scarring. There is  no significant pleural effusion or pneumothorax. IMPRESSION: There is new patchy infiltrate in the posterior right lower lung fields suggesting atelectasis/pneumonia. Part of this finding may suggest underlying scarring. COPD. There is no pleural effusion or pneumothorax. Electronically Signed   By: Elmer Picker M.D.   On: 02/18/2021 17:32   CT Angio Chest PE W/Cm &/Or Wo Cm  Result Date: 02/18/2021 CLINICAL DATA:  Concern for pulmonary embolism. EXAM: CT ANGIOGRAPHY CHEST WITH CONTRAST TECHNIQUE: Multidetector CT imaging of the chest was performed using the standard protocol during bolus administration of intravenous contrast. Multiplanar CT image reconstructions and MIPs were obtained to evaluate the vascular anatomy. CONTRAST:  22mL OMNIPAQUE IOHEXOL 350 MG/ML SOLN COMPARISON:  Chest radiograph dated 02/18/2021. FINDINGS: Cardiovascular: No cardiomegaly or pericardial effusion. There is coronary vascular calcification of the LAD. Low attenuating area along the left anterior wall of the proximal aortic arch likely represents a noncalcified plaque or mural thrombus. Large lobar pulmonary artery embolus involving the left lower lobe branch extending into the segmental and subsegmental branches. Additionally there is  occlusive thrombus in the right lower lobe segmental pulmonary artery branch. The right ventricular lumen measures 4.1 cm and the left ventricular lumen measures 4.0 cm for a ratio of approximately 1.0 consistent with right heart straining. Mediastinum/Nodes: No hilar or mediastinal adenopathy. The esophagus is grossly unremarkable. No mediastinal fluid collection. Lungs/Pleura: Patchy area of ground-glass opacity involving the right lung base may represent an area of infarct but pneumonia is not excluded. Additionally there are scattered clusters of ground-glass density throughout the lungs. There is a 1.7 x 3.3 cm cluster of ground-glass density with somewhat irregular and spiculated margins the right upper lobe along the fissure (47/6). Findings may be infectious or inflammatory in etiology or represent malignancy. Multidisciplinary consult is advised. There is a 5 mm ground-glass nodule in the superior segment of the right lower lobe (55/6). There is a small right pleural effusion. No pneumothorax. The central airways are patent. Upper Abdomen: No acute abnormality. Musculoskeletal: Age indeterminate mild compression fracture of the superior endplate of T9. Correlation with clinical exam and point tenderness recommended. No other acute osseous pathology. Review of the MIP images confirms the above findings. IMPRESSION: 1. Bilateral lower lobe pulmonary artery emboli with CT evidence of right heart straining (RV/LV Ratio = 1.0) consistent with at least submassive (intermediate risk) PE. The presence of right heart strain has been associated with an increased risk of morbidity and mortality. 2. Right lower lobe ground-glass opacity may represent an area of infarct or infiltrate. 3. Scattered clusters of ground-glass density throughout the lungs with the largest in the right upper lobe demonstrating a somewhat irregular or spiculated margins. Findings may be infectious or inflammatory in etiology or represent  malignancy. Multidisciplinary consult is advised. 4. Small right pleural effusion. 5. Age indeterminate mild compression fracture of the superior endplate of T9. Correlation with clinical exam and point tenderness recommended. 6. Aortic Atherosclerosis (ICD10-I70.0). These results were called by telephone at the time of interpretation on 02/18/2021 at 10:07 pm to provider [facility] , who verbally acknowledged these results. Electronically Signed   By: Anner Crete M.D.   On: 02/18/2021 22:10   US Venous Img Lower Unilateral Right (DVT)  Result Date: 02/18/2021 CLINICAL DATA:  Right lower extremity swelling. EXAM: Right LOWER EXTREMITY VENOUS DOPPLER ULTRASOUND TECHNIQUE: Gray-scale sonography with compression, as well as color and duplex ultrasound, were performed to evaluate the deep venous system(s) from the level of the common femoral vein through  the popliteal and proximal calf veins. COMPARISON:  None. FINDINGS: VENOUS Echogenic filling defects are demonstrated in the right femoral and popliteal vein as well as at the saphenofemoral junction and muscular perforators. Involved vessels are noncompressible with lack of flow on color flow Doppler imaging. Changes are consistent with acute deep venous thrombosis. The right common femoral vein and posterior tibial vein appear patent. Limited views of the contralateral common femoral vein are unremarkable. OTHER None. Limitations: none IMPRESSION: Positive examination for acute deep venous thrombosis involving the right femoral and popliteal veins with expansile noncompressible clot demonstrated throughout. Critical Value/emergent results were called by telephone at the time of interpretation on 02/18/2021 at 8:24 pm to provider [facility] , who verbally acknowledged these results. Electronically Signed   By: Lucienne Capers M.D.   On: 02/18/2021 20:29    EKG: Independently reviewed. Sinus, no st elevation  Assessment/Plan B/L Pulmonary  Embolism RLE DVT     - admitted to inpt, progressive     - echo pending     - continue heparin gtt; spoke w/ PCCM, rec'd she stay on it for the next 3 days at minimum     - pt has not made decision on [facility] yet  Hypokalemia     - replace K+, check Mg2+; follow labs  Ground glass opacities on CTA chest     - reviewed with PCCM; rec'd infectious w/u and autoimmune w/u     - they try to avoid invasive procedures while a patient has an acute PE, so she will be set up with outpt follow up  Tobacco abuse     - counsel on abstinence  DVT prophylaxis: heparin gtt  Code Status: FULL  Family Communication: None at bedside  Consults called: Pulmonology   Status is: Inpatient  Remains inpatient appropriate because: severity of illness  Time spent coordinating admission: 70 minutes  [place] [facility]  If 7PM-7AM, please contact night-coverage [email or web address]  02/19/2021, 12:09 PM

## 2021-02-19 NOTE — Telephone Encounter (Signed)
Dear Mel Almond  Dr. Cherylann Ratel hospitalist called me about this patient patient is a smoker admitted with DVT and PE which the hospitalist are treating at [facility] long.  Patient's found to have incidental right upper lobe nodule which is groundglass opacity.  Could be early stage lung cancer.  He is going to do infectious work-up but the patient does need follow-up for that in the office setting  Plan - Refer to [facility] to [facility] for a 30-minute slot sometime in the next few to several weeks

## 2021-02-20 DIAGNOSIS — I824Y1 Acute embolism and thrombosis of unspecified deep veins of right proximal lower extremity: Secondary | ICD-10-CM | POA: Diagnosis not present

## 2021-02-20 DIAGNOSIS — I2699 Other pulmonary embolism without acute cor pulmonale: Secondary | ICD-10-CM | POA: Diagnosis not present

## 2021-02-20 LAB — COMPREHENSIVE METABOLIC PANEL
ALT: 10 U/L (ref 0–44)
AST: 20 U/L (ref 15–41)
Albumin: 2.7 g/dL — ABNORMAL LOW (ref 3.5–5.0)
Alkaline Phosphatase: 187 U/L — ABNORMAL HIGH (ref 38–126)
Anion gap: 8 (ref 5–15)
BUN: 6 mg/dL — ABNORMAL LOW (ref 8–23)
CO2: 24 mmol/L (ref 22–32)
Calcium: 8.3 mg/dL — ABNORMAL LOW (ref 8.9–10.3)
Chloride: 99 mmol/L (ref 98–111)
Creatinine, Ser: 0.39 mg/dL — ABNORMAL LOW (ref 0.44–1.00)
GFR, Estimated: >60 mL/min (ref >60)
Glucose, Bld: 110 mg/dL — ABNORMAL HIGH (ref 70–99)
Potassium: 3.2 mmol/L — ABNORMAL LOW (ref 3.5–5.1)
Sodium: 131 mmol/L — ABNORMAL LOW (ref 135–145)
Total Bilirubin: 1.8 mg/dL — ABNORMAL HIGH (ref 0.3–1.2)
Total Protein: 6.1 g/dL — ABNORMAL LOW (ref 6.5–8.1)

## 2021-02-20 LAB — CBC
HCT: 45.4 % (ref 36.0–46.0)
Hemoglobin: 16.1 g/dL — ABNORMAL HIGH (ref 12.0–15.0)
MCH: 40 pg — ABNORMAL HIGH (ref 26.0–34.0)
MCHC: 35.5 g/dL (ref 30.0–36.0)
MCV: 112.9 fL — ABNORMAL HIGH (ref 80.0–100.0)
Platelets: 204 10*3/uL (ref 150–400)
RBC: 4.02 MIL/uL (ref 3.87–5.11)
RDW: 14.8 % (ref 11.5–15.5)
WBC: 10.8 10*3/uL — ABNORMAL HIGH (ref 4.0–10.5)
nRBC: 0 % (ref 0.0–0.2)

## 2021-02-20 LAB — ANA W/REFLEX IF POSITIVE: Anti Nuclear Antibody (ANA): NEGATIVE

## 2021-02-20 LAB — HEPARIN LEVEL (UNFRACTIONATED)
Heparin Unfractionated: 0.11 IU/mL — ABNORMAL LOW (ref 0.30–0.70)
Heparin Unfractionated: <0.1 IU/mL — ABNORMAL LOW (ref 0.30–0.70)

## 2021-02-20 MED ORDER — POTASSIUM CHLORIDE CRYS ER 20 MEQ PO TBCR
40.0000 meq | EXTENDED_RELEASE_TABLET | Freq: Every day | ORAL | Status: DC
  Administered 2021-02-20: 40 meq via ORAL
  Filled 2021-02-20: qty 2

## 2021-02-20 MED ORDER — HEPARIN BOLUS VIA INFUSION
2000.0000 [IU] | Freq: Once | INTRAVENOUS | Status: AC
  Administered 2021-02-20: 2000 [IU] via INTRAVENOUS
  Filled 2021-02-20: qty 2000

## 2021-02-20 MED ORDER — MAGNESIUM OXIDE -MG SUPPLEMENT 400 (240 MG) MG PO TABS
400.0000 mg | ORAL_TABLET | Freq: Two times a day (BID) | ORAL | Status: DC
  Administered 2021-02-20 – 2021-02-21 (×3): 400 mg via ORAL
  Filled 2021-02-20 (×3): qty 1

## 2021-02-20 MED ORDER — APIXABAN 5 MG PO TABS
10.0000 mg | ORAL_TABLET | Freq: Two times a day (BID) | ORAL | Status: DC
  Administered 2021-02-20 – 2021-02-21 (×3): 10 mg via ORAL
  Filled 2021-02-20 (×3): qty 2

## 2021-02-20 MED ORDER — APIXABAN 5 MG PO TABS: 5.0000 mg | ORAL_TABLET | Freq: Two times a day (BID) | ORAL | Status: DC

## 2021-02-20 NOTE — Progress Notes (Addendum)
PROGRESS NOTE  Nazli Penn WEX:[id number] DOB: May 29, 1955 DOA: 02/18/2021 PCP: [name], NP   LOS: 1 day   Brief narrative:  Sue Chase is a 65 y.o. female with past medical history of smoking presented to the hospital with right leg swelling.  Right lower extremity Doppler was positive for DVT.  CTA of the chest was positive for bilateral pulmonary embolism.  Patient was started on heparin drip and was admitted hospital for further evaluation and treatment.    Assessment/Plan:  Principal Problem:   Acute pulmonary embolism (HCC)  B/L Pulmonary Embolism/ RLE DVT Patient is currently on heparin drip.  Spoke with the patient regarding oral anticoagulation.  Risk versus benefits were explained.  At this time, patient has agreed to proceed with Eliquis.  Pharmacy will be consulted.  Bleeding precautions emphasized.  Respiratory viral panel negative.   Hypokalemia Continue to replace.  Check BMP in AM.  Hypomagnesemia.  Received magnesium sulfate IV.  Check levels in a.m.  Add oral magnesium oxide as well.  Hyponatremia. Mild.  We will continue to monitor.  Incidental right upper lobe nodule with groundglass opacity CT of the chest showed groundglass opacities.  Case was reviewed with PCCM.  Right upper lobe nodule with groundglass opacity.  Will need pulmonary follow-up as outpatient.  Mild leukocytosis noted.  Strep urinary antigen negative.  Tobacco abuse Continue nicotine patch.  DVT prophylaxis:  apixaban (ELIQUIS) tablet 10 mg  apixaban (ELIQUIS) tablet 5 mg    Code Status: Full code  Family Communication: Spoke with the patient at bedside.  Status is: Inpatient  Remains inpatient appropriate because: DVT pulmonary embolism, on heparin drip.   Consultants: None  Procedures: None  Anti-infectives:  None  Anti-infectives (From admission, onward)    None      Subjective: Today, patient was seen and examined at bedside.  Patient  denies any chest pain, nausea or vomiting but had mild streak of blood in her tissue.  Feels overwhelmed because her husband is in the hospital as well.  Objective: Vitals:   02/20/21 0445 02/20/21 0800  BP: 135/76   Pulse: (!) 101   Resp: 20 19  Temp: 98.4 F (36.9 C)   SpO2: 96%     Intake/Output Summary (Last 24 hours) at 02/20/2021 1121 Last data filed at 02/20/2021 0700 Gross per 24 hour  Intake 1252.22 ml  Output --  Net 1252.22 ml   Filed Weights   02/18/21 1641  Weight: 47.2 kg   Body mass index is 18.42 kg/m.   Physical Exam: GENERAL: Patient is alert awake and oriented. Not in obvious distress.  Mildly anxious, thinly built HENT: No scleral pallor or icterus. Pupils equally reactive to light. Oral mucosa is moist NECK: is supple, no gross swelling noted. CHEST:  Diminished breath sounds bilaterally. CVS: S1 and S2 heard, no murmur. Regular rate and rhythm.  ABDOMEN: Soft, non-tender, bowel sounds are present. EXTREMITIES: Leg swelling has improved as per the patient CNS: Cranial nerves are intact. No focal motor deficits. SKIN: warm and dry without rashes.  Data Review: I have personally reviewed the following laboratory data and studies,  CBC: Recent Labs  Lab 02/18/21 2044 02/19/21 1408 02/20/21 0005  WBC 11.1* 10.9* 10.8*  NEUTROABS 7.2 7.1  --   HGB 17.0* 17.1* 16.1*  HCT 48.1* 47.8* 45.4  MCV 112.9* 111.9* 112.9*  PLT 214 203 938   Basic Metabolic Panel: Recent Labs  Lab 02/18/21 2044 02/19/21 1408 02/20/21 0005  NA 135  132* 131*  K 2.5* 3.0* 3.2*  CL 97* 99 99  CO2 29 24 24   GLUCOSE 88 107* 110*  BUN 12 9 6*  CREATININE 0.40* 0.42* 0.39*  CALCIUM 8.8* 8.0* 8.3*  MG  --  1.4*  --    Liver Function Tests: Recent Labs  Lab 02/19/21 1408 02/20/21 0005  AST 17 20  ALT 11 10  ALKPHOS 185* 187*  BILITOT 1.8* 1.8*  PROT 6.8 6.1*  ALBUMIN 2.9* 2.7*   No results for input(s): LIPASE, AMYLASE in the last 168 hours. No results for  input(s): AMMONIA in the last 168 hours. Cardiac Enzymes: No results for input(s): CKTOTAL, CKMB, CKMBINDEX, TROPONINI in the last 168 hours. BNP (last 3 results) Recent Labs    02/18/21 2044  BNP 111.9*    ProBNP (last 3 results) No results for input(s): PROBNP in the last 8760 hours.  CBG: No results for input(s): GLUCAP in the last 168 hours. Recent Results (from the past 240 hour(s))  Resp Panel by RT-PCR (Flu A&B, Covid) Nasopharyngeal Swab     Status: None   Collection Time: 02/18/21  4:48 PM   Specimen: Nasopharyngeal Swab; Nasopharyngeal(NP) swabs in vial transport medium  Result Value Ref Range Status   SARS Coronavirus 2 by RT PCR NEGATIVE NEGATIVE Final    Comment: (NOTE) SARS-CoV-2 target nucleic acids are NOT DETECTED.  The SARS-CoV-2 RNA is generally detectable in upper respiratory specimens during the acute phase of infection. The lowest concentration of SARS-CoV-2 viral copies this assay can detect is 138 copies/mL. A negative result does not preclude SARS-Cov-2 infection and should not be used as the sole basis for treatment or other patient management decisions. A negative result may occur with  improper specimen collection/handling, submission of specimen other than nasopharyngeal swab, presence of viral mutation(s) within the areas targeted by this assay, and inadequate number of viral copies(<138 copies/mL). A negative result must be combined with clinical observations, patient history, and epidemiological information. The expected result is Negative.  Fact Sheet for Patients:  EntrepreneurPulse.com.au  Fact Sheet for Healthcare Providers:  IncredibleEmployment.be  This test is no t yet approved or cleared by the [place] FDA and  has been authorized for detection and/or diagnosis of SARS-CoV-2 by FDA under an Emergency Use Authorization (EUA). This EUA will remain  in effect (meaning this test can be used) for  the duration of the COVID-19 declaration under Section 564(b)(1) of the Act, 21 U.S.C.section 360bbb-3(b)(1), unless the authorization is terminated  or revoked sooner.       Influenza A by PCR NEGATIVE NEGATIVE Final   Influenza B by PCR NEGATIVE NEGATIVE Final    Comment: (NOTE) The Xpert Xpress SARS-CoV-2/FLU/RSV plus assay is intended as an aid in the diagnosis of influenza from Nasopharyngeal swab specimens and should not be used as a sole basis for treatment. Nasal washings and aspirates are unacceptable for Xpert Xpress SARS-CoV-2/FLU/RSV testing.  Fact Sheet for Patients: EntrepreneurPulse.com.au  Fact Sheet for Healthcare Providers: IncredibleEmployment.be  This test is not yet approved or cleared by the [place] FDA and has been authorized for detection and/or diagnosis of SARS-CoV-2 by FDA under an Emergency Use Authorization (EUA). This EUA will remain in effect (meaning this test can be used) for the duration of the COVID-19 declaration under Section 564(b)(1) of the Act, 21 U.S.C. section 360bbb-3(b)(1), unless the authorization is terminated or revoked.  Performed at [facility], [address] [place], [place] [address]   Respiratory (~20  pathogens) panel by PCR     Status: None   Collection Time: 02/19/21 12:41 PM   Specimen: Nasopharyngeal Swab; Respiratory  Result Value Ref Range Status   Adenovirus NOT DETECTED NOT DETECTED Final   Coronavirus 229E NOT DETECTED NOT DETECTED Final    Comment: (NOTE) The Coronavirus on the Respiratory Panel, DOES NOT test for the novel  Coronavirus (2019 nCoV)    Coronavirus HKU1 NOT DETECTED NOT DETECTED Final   Coronavirus NL63 NOT DETECTED NOT DETECTED Final   Coronavirus OC43 NOT DETECTED NOT DETECTED Final   Metapneumovirus NOT DETECTED NOT DETECTED Final   Rhinovirus / Enterovirus NOT DETECTED NOT DETECTED Final   Influenza A NOT DETECTED NOT DETECTED  Final   Influenza B NOT DETECTED NOT DETECTED Final   Parainfluenza Virus 1 NOT DETECTED NOT DETECTED Final   Parainfluenza Virus 2 NOT DETECTED NOT DETECTED Final   Parainfluenza Virus 3 NOT DETECTED NOT DETECTED Final   Parainfluenza Virus 4 NOT DETECTED NOT DETECTED Final   Respiratory Syncytial Virus NOT DETECTED NOT DETECTED Final   Bordetella pertussis NOT DETECTED NOT DETECTED Final   Bordetella Parapertussis NOT DETECTED NOT DETECTED Final   Chlamydophila pneumoniae NOT DETECTED NOT DETECTED Final   Mycoplasma pneumoniae NOT DETECTED NOT DETECTED Final    Comment: Performed at [facility], [place]. [address], [place], [place] [address]  Expectorated Sputum Assessment w Gram Stain, Rflx to Resp Cult     Status: None   Collection Time: 02/19/21  1:43 PM   Specimen: Sputum  Result Value Ref Range Status   Specimen Description SPUTUM  Final   Special Requests NONE  Final   Sputum evaluation   Final    THIS SPECIMEN IS ACCEPTABLE FOR SPUTUM CULTURE Performed at [facility], [place] [address] [place], [place] [address]    Report Status 02/19/2021 FINAL  Final  Culture, Respiratory w Gram Stain     Status: None (Preliminary result)   Collection Time: 02/19/21  1:43 PM   Specimen: SPU  Result Value Ref Range Status   Specimen Description   Final    SPUTUM Performed at [place] [address], [place], [place] [address]    Special Requests   Final    NONE Reflexed from [id number] Performed at [place] [address], [place], [place] [address]    Gram Stain   Final    NO SQUAMOUS EPITHELIAL CELLS SEEN MODERATE WBC SEEN MODERATE GRAM POSITIVE COCCI Performed at [facility], [place] [address], [place],  [address]    Culture PENDING  Incomplete   Report Status PENDING  Incomplete     Studies: DG Chest 2 View  Result Date: 02/18/2021 CLINICAL DATA:  Cough EXAM: CHEST - 2 VIEW COMPARISON:   01/04/2019 FINDINGS: Cardiac size is within normal limits. Low position of diaphragms suggests COPD. New small patchy infiltrate is seen in the posterior right lower lung fields. Linear density in the right upper lung fields has not changed significantly suggesting scarring. Linear density in the right middle lobe has not changed significantly, possibly suggesting scarring. There is no significant pleural effusion or pneumothorax. IMPRESSION: There is new patchy infiltrate in the posterior right lower lung fields suggesting atelectasis/pneumonia. Part of this finding may suggest underlying scarring. COPD. There is no pleural effusion or pneumothorax. Electronically Signed   By: Elmer Picker M.D.   On: 02/18/2021 17:32   CT Angio Chest PE W/Cm &/Or Wo Cm  Result Date: 02/18/2021 CLINICAL DATA:  Concern for pulmonary embolism. EXAM: CT ANGIOGRAPHY CHEST WITH CONTRAST TECHNIQUE: Multidetector CT imaging of the chest was performed using the standard protocol during bolus administration of intravenous contrast. Multiplanar CT image reconstructions and MIPs were obtained to evaluate the vascular anatomy. CONTRAST:  29mL OMNIPAQUE IOHEXOL 350 MG/ML SOLN COMPARISON:  Chest radiograph dated 02/18/2021. FINDINGS: Cardiovascular: No cardiomegaly or pericardial effusion. There is coronary vascular calcification of the LAD. Low attenuating area along the left anterior wall of the proximal aortic arch likely represents a noncalcified plaque or mural thrombus. Large lobar pulmonary artery embolus involving the left lower lobe branch extending into the segmental and subsegmental branches. Additionally there is occlusive thrombus in the right lower lobe segmental pulmonary artery branch. The right ventricular lumen measures 4.1 cm and the left ventricular lumen measures 4.0 cm for a ratio of approximately 1.0 consistent with right heart straining. Mediastinum/Nodes: No hilar or mediastinal adenopathy. The esophagus is  grossly unremarkable. No mediastinal fluid collection. Lungs/Pleura: Patchy area of ground-glass opacity involving the right lung base may represent an area of infarct but pneumonia is not excluded. Additionally there are scattered clusters of ground-glass density throughout the lungs. There is a 1.7 x 3.3 cm cluster of ground-glass density with somewhat irregular and spiculated margins the right upper lobe along the fissure (47/6). Findings may be infectious or inflammatory in etiology or represent malignancy. Multidisciplinary consult is advised. There is a 5 mm ground-glass nodule in the superior segment of the right lower lobe (55/6). There is a small right pleural effusion. No pneumothorax. The central airways are patent. Upper Abdomen: No acute abnormality. Musculoskeletal: Age indeterminate mild compression fracture of the superior endplate of T9. Correlation with clinical exam and point tenderness recommended. No other acute osseous pathology. Review of the MIP images confirms the above findings. IMPRESSION: 1. Bilateral lower lobe pulmonary artery emboli with CT evidence of right heart straining (RV/LV Ratio = 1.0) consistent with at least submassive (intermediate risk) PE. The presence of right heart strain has been associated with an increased risk of morbidity and mortality. 2. Right lower lobe ground-glass opacity may represent an area of infarct or infiltrate. 3. Scattered clusters of ground-glass density throughout the lungs with the largest in the right upper lobe demonstrating a somewhat irregular or spiculated margins. Findings may be infectious or inflammatory in etiology or represent malignancy. Multidisciplinary consult is advised. 4. Small right pleural effusion. 5. Age indeterminate mild compression fracture of the superior endplate of T9. Correlation with clinical exam and point tenderness recommended. 6. Aortic Atherosclerosis (ICD10-I70.0). These results were called by telephone at the time  of interpretation on 02/18/2021 at 10:07 pm to provider [facility] , who verbally acknowledged these results. Electronically Signed   By: Anner Crete M.D.   On: 02/18/2021 22:10   US Venous Img Lower Unilateral Right (DVT)  Result Date: 02/18/2021 CLINICAL DATA:  Right lower extremity swelling. EXAM: Right LOWER EXTREMITY VENOUS DOPPLER ULTRASOUND TECHNIQUE: Gray-scale sonography with compression, as well as color and duplex ultrasound, were performed to evaluate the deep venous system(s) from the level of the common femoral vein through the popliteal and proximal calf veins. COMPARISON:  None. FINDINGS: VENOUS Echogenic filling defects are demonstrated in the right femoral and popliteal vein as well as at the saphenofemoral junction and muscular perforators. Involved vessels are noncompressible with lack of flow on color flow Doppler imaging. Changes are consistent with acute deep venous thrombosis. The right common femoral vein and posterior tibial  vein appear patent. Limited views of the contralateral common femoral vein are unremarkable. OTHER None. Limitations: none IMPRESSION: Positive examination for acute deep venous thrombosis involving the right femoral and popliteal veins with expansile noncompressible clot demonstrated throughout. Critical Value/emergent results were called by telephone at the time of interpretation on 02/18/2021 at 8:24 pm to provider [facility] , who verbally acknowledged these results. Electronically Signed   By: Lucienne Capers M.D.   On: 02/18/2021 20:29   ECHOCARDIOGRAM COMPLETE  Result Date: 02/19/2021    ECHOCARDIOGRAM REPORT   Patient Name:   TEVA BRONKEMA Date of Exam: 02/19/2021 Medical Rec #:  [id number]           Height:       63.0 in Accession #:    [id number]          Weight:       104.0 lb Date of Birth:  09-Jan-1956            BSA:          1.464 m Patient Age:    37 years            BP:           129/89 mmHg Patient Gender: F                    HR:           81 bpm. Exam Location:  Inpatient Procedure: 2D Echo, Color Doppler and Cardiac Doppler Indications:    I26.02 Pulmonary embolus  History:        Patient has prior history of Echocardiogram examinations, most                 recent 07/26/2017. Risk Factors:Dyslipidemia.  Sonographer:    Raquel Sarna Senior RDCS Referring Phys: [id number] [place]  1. Left ventricular ejection fraction, by estimation, is 55 to 60%. The left ventricle has normal function. The left ventricle has no regional wall motion abnormalities. Indeterminate diastolic filling due to E-A fusion.  2. Right ventricular systolic function is normal. The right ventricular size is normal. There is normal pulmonary artery systolic pressure. The estimated right ventricular systolic pressure is 40.1 mmHg.  3. The mitral valve is grossly normal. Trivial mitral valve regurgitation. No evidence of mitral stenosis.  4. The aortic valve is grossly normal. Aortic valve regurgitation is not visualized. No aortic stenosis is present.  5. The inferior vena cava is normal in size with greater than 50% respiratory variability, suggesting right atrial pressure of 3 mmHg. FINDINGS  Left Ventricle: Left ventricular ejection fraction, by estimation, is 55 to 60%. The left ventricle has normal function. The left ventricle has no regional wall motion abnormalities. The left ventricular internal cavity size was normal in size. There is  no left ventricular hypertrophy. Indeterminate diastolic filling due to E-A fusion. Right Ventricle: The right ventricular size is normal. No increase in right ventricular wall thickness. Right ventricular systolic function is normal. There is normal pulmonary artery systolic pressure. The tricuspid regurgitant velocity is 2.23 m/s, and  with an assumed right atrial pressure of 3 mmHg, the estimated right ventricular systolic pressure is 02.7 mmHg. Left Atrium: Left atrial size was normal in size. Right Atrium: Right  atrial size was normal in size. Pericardium: There is no evidence of pericardial effusion. Presence of epicardial fat layer. Mitral Valve: The mitral valve is grossly normal. Trivial mitral valve regurgitation. No evidence of mitral valve stenosis. Tricuspid  Valve: The tricuspid valve is grossly normal. Tricuspid valve regurgitation is trivial. No evidence of tricuspid stenosis. Aortic Valve: The aortic valve is grossly normal. Aortic valve regurgitation is not visualized. No aortic stenosis is present. Pulmonic Valve: The pulmonic valve was grossly normal. Pulmonic valve regurgitation is not visualized. No evidence of pulmonic stenosis. Aorta: The aortic root and ascending aorta are structurally normal, with no evidence of dilitation. Venous: The inferior vena cava is normal in size with greater than 50% respiratory variability, suggesting right atrial pressure of 3 mmHg. IAS/Shunts: The atrial septum is grossly normal.  LEFT VENTRICLE PLAX 2D LVIDd:         4.60 cm LVIDs:         3.45 cm LV PW:         0.80 cm LV IVS:        0.70 cm LVOT diam:     1.90 cm LV SV:         55 LV SV Index:   37 LVOT Area:     2.84 cm  RIGHT VENTRICLE RV S prime:     13.40 cm/s TAPSE (M-mode): 2.2 cm LEFT ATRIUM             Index        RIGHT ATRIUM           Index LA diam:        2.80 cm 1.91 cm/m   RA Area:     11.20 cm LA Vol (A2C):   37.4 ml 25.54 ml/m  RA Volume:   23.30 ml  15.91 ml/m LA Vol (A4C):   37.8 ml 25.81 ml/m LA Biplane Vol: 37.8 ml 25.81 ml/m  AORTIC VALVE LVOT Vmax:   95.10 cm/s LVOT Vmean:  66.700 cm/s LVOT VTI:    0.193 m  AORTA Ao Root diam: 2.40 cm Ao Asc diam:  2.90 cm TRICUSPID VALVE TR Peak grad:   19.9 mmHg TR Vmax:        223.00 cm/s  SHUNTS Systemic VTI:  0.19 m Systemic Diam: 1.90 cm Eleonore Chiquito MD Electronically signed by Eleonore Chiquito MD Signature Date/Time: 02/19/2021/1:59:28 PM    Final       Flora Lipps, MD  [facility] 02/20/2021  If 7PM-7AM, please contact night-coverage

## 2021-02-20 NOTE — Progress Notes (Signed)
ANTICOAGULATION CONSULT NOTE   Pharmacy Consult for Heparin Indication: pulmonary embolus  Allergies  Allergen Reactions   Codeine Itching    Patient Measurements: Height: 5\' 3"  (160 cm) Weight: 47.2 kg (104 lb) IBW/kg (Calculated) : 52.4 Heparin Dosing Weight: 47.2 kg  Vital Signs: Temp: 98 F (36.7 C) (12/31 0058) Temp Source: Oral (12/31 0058) BP: 136/85 (12/31 0058) Pulse Rate: 110 (12/31 0058)  Labs: Recent Labs    02/18/21 2044 02/18/21 2220 02/18/21 2248 02/19/21 0553 02/19/21 0732 02/19/21 1408 02/19/21 1449 02/20/21 0005  HGB 17.0*  --   --   --   --  17.1*  --  16.1*  HCT 48.1*  --   --   --   --  47.8*  --  45.4  PLT 214  --   --   --   --  203  --  204  APTT  --  31  --   --   --   --   --   --   LABPROT  --  12.9  --   --   --   --   --   --   INR  --  1.0  --   --   --   --   --   --   HEPARINUNFRC  --   --   --    < >  --  <0.10* <0.10* <0.10*  CREATININE 0.40*  --   --   --   --  0.42*  --  0.39*  TROPONINIHS 5  --  5  --  4  --   --   --    < > = values in this interval not displayed.     Estimated Creatinine Clearance: 52.2 mL/min (A) (by C-G formula based on SCr of 0.39 mg/dL (L)).   Medical History: Past Medical History:  Diagnosis Date   Colon polyps    Hyperlipidemia    Rheumatic fever    Seasonal allergies    UTI (urinary tract infection)     Medications:  Medications Prior to Admission  Medication Sig Dispense Refill Last Dose   acetaminophen (TYLENOL) 325 MG tablet Take 2 tablets (650 mg total) by mouth in the morning, at noon, in the evening, and at bedtime. 30 tablet 0 Past Week   ibuprofen (ADVIL) 400 MG tablet Take 400 mg by mouth every 6 (six) hours as needed.   unk   albuterol (VENTOLIN HFA) 108 (90 Base) MCG/ACT inhaler Inhale 2 puffs into the lungs every 6 (six) hours as needed for wheezing or shortness of breath. (Patient not taking: Reported on 02/19/2021) 8 g 1 Not Taking   omeprazole (PRILOSEC) 20 MG capsule Take 1  capsule (20 mg total) by mouth daily. (Patient not taking: Reported on 02/19/2021) 30 capsule 3 Not Taking    Scheduled:   nicotine  14 mg Transdermal Daily    Infusions:   sodium chloride 100 mL/hr at 02/19/21 2300   heparin 1,150 Units/hr (02/19/21 2300)   PRN:   Assessment: 35 yof with Bilateral lower lobe pulmonary artery emboli w/ CT evidence of RHS. Heparin per pharmacy consult placed for pulmonary embolus.  Patient is not on anticoagulation prior to arrival.   heparin level continues to be undetectable with heparin infusing at 1150 units/hr, no infusion issues reported per RN, and no bleeding.  Goal of Therapy:  Heparin level 0.3-0.7 units/ml Monitor platelets by anticoagulation protocol: Yes   Plan:  Heparin 2000  units IV bolus x 1, and increase heparin infusion to 1300 units/hr F/u 6 hour heparin level Monitor daily heparin level, CBC, signs/symptoms of bleeding  Dolly Rias RPh 02/20/2021, 1:14 AM

## 2021-02-20 NOTE — Discharge Instructions (Signed)
Information on my medicine - ELIQUIS (apixaban)  This medication education was reviewed with me or my healthcare representative as part of my discharge preparation.  The pharmacist that spoke with me during my hospital stay was:    Why was Eliquis prescribed for you? Eliquis was prescribed to treat blood clots that may have been found in the veins of your legs (deep vein thrombosis) or in your lungs (pulmonary embolism) and to reduce the risk of them occurring again.  What do You need to know about Eliquis ? The starting dose is 10 mg (two 5 mg tablets) taken TWICE daily for the FIRST SEVEN (7) DAYS, then on  02/27/21  the dose is reduced to ONE 5 mg tablet taken TWICE daily.  Eliquis may be taken with or without food.   Try to take the dose about the same time in the morning and in the evening. If you have difficulty swallowing the tablet whole please discuss with your pharmacist how to take the medication safely.  Take Eliquis exactly as prescribed and DO NOT stop taking Eliquis without talking to the doctor who prescribed the medication.  Stopping may increase your risk of developing a new blood clot.  Refill your prescription before you run out.  After discharge, you should have regular check-up appointments with your healthcare provider that is prescribing your Eliquis.    What do you do if you miss a dose? If a dose of ELIQUIS is not taken at the scheduled time, take it as soon as possible on the same day and twice-daily administration should be resumed. The dose should not be doubled to make up for a missed dose.  Important Safety Information A possible side effect of Eliquis is bleeding. You should call your healthcare provider right away if you experience any of the following: Bleeding from an injury or your nose that does not stop. Unusual colored urine (red or dark brown) or unusual colored stools (red or black). Unusual bruising for unknown reasons. A serious fall or if  you hit your head (even if there is no bleeding).  Some medicines may interact with Eliquis and might increase your risk of bleeding or clotting while on Eliquis. To help avoid this, consult your healthcare provider or pharmacist prior to using any new prescription or non-prescription medications, including herbals, vitamins, non-steroidal anti-inflammatory drugs (NSAIDs) and supplements.  This website has more information on Eliquis (apixaban): http://www.eliquis.com/eliquis/home

## 2021-02-20 NOTE — Progress Notes (Signed)
Patient ambulated in hallway x2.  Once with daughter from one end of the hall to another and then alone.  Tolerated well.  No SOB.

## 2021-02-20 NOTE — Progress Notes (Signed)
ANTICOAGULATION CONSULT NOTE   Pharmacy Consult for Heparin >> Eliquis Indication: pulmonary embolus  Allergies  Allergen Reactions   Codeine Itching    Patient Measurements: Height: 5\' 3"  (160 cm) Weight: 47.2 kg (104 lb) IBW/kg (Calculated) : 52.4 Heparin Dosing Weight: 47.2 kg  Vital Signs: Temp: 98.4 F (36.9 C) (12/31 0445) Temp Source: Oral (12/31 0445) BP: 135/76 (12/31 0445) Pulse Rate: 101 (12/31 0445)  Labs: Recent Labs    02/18/21 2044 02/18/21 2220 02/18/21 2248 02/19/21 0553 02/19/21 0732 02/19/21 1408 02/19/21 1449 02/20/21 0005 02/20/21 0820  HGB 17.0*  --   --   --   --  17.1*  --  16.1*  --   HCT 48.1*  --   --   --   --  47.8*  --  45.4  --   PLT 214  --   --   --   --  203  --  204  --   APTT  --  31  --   --   --   --   --   --   --   LABPROT  --  12.9  --   --   --   --   --   --   --   INR  --  1.0  --   --   --   --   --   --   --   HEPARINUNFRC  --   --   --    < >  --  <0.10* <0.10* <0.10* 0.11*  CREATININE 0.40*  --   --   --   --  0.42*  --  0.39*  --   TROPONINIHS 5  --  5  --  4  --   --   --   --    < > = values in this interval not displayed.     Estimated Creatinine Clearance: 52.2 mL/min (A) (by C-G formula based on SCr of 0.39 mg/dL (L)).   Medical History: Past Medical History:  Diagnosis Date   Colon polyps    Hyperlipidemia    Rheumatic fever    Seasonal allergies    UTI (urinary tract infection)     Medications:  Medications Prior to Admission  Medication Sig Dispense Refill Last Dose   acetaminophen (TYLENOL) 325 MG tablet Take 2 tablets (650 mg total) by mouth in the morning, at noon, in the evening, and at bedtime. 30 tablet 0 Past Week   ibuprofen (ADVIL) 400 MG tablet Take 400 mg by mouth every 6 (six) hours as needed.   unk   albuterol (VENTOLIN HFA) 108 (90 Base) MCG/ACT inhaler Inhale 2 puffs into the lungs every 6 (six) hours as needed for wheezing or shortness of breath. (Patient not taking: Reported on  02/19/2021) 8 g 1 Not Taking   omeprazole (PRILOSEC) 20 MG capsule Take 1 capsule (20 mg total) by mouth daily. (Patient not taking: Reported on 02/19/2021) 30 capsule 3 Not Taking    Scheduled:   nicotine  14 mg Transdermal Daily   potassium chloride  40 mEq Oral Daily    Infusions:   sodium chloride 100 mL/hr at 02/20/21 0700   heparin 1,300 Units/hr (02/20/21 0700)   PRN:   Assessment: 19 yof with Bilateral lower lobe pulmonary artery emboli w/ CT evidence of RHS. Heparin per pharmacy consult placed for pulmonary embolus. Pharmacy consulted to transition to Eliquis  Plan:  Will initiate Eliquis 10 mg bid  x 7 days followed by 5 mg bid thereafter. Instructed RN to stop heparin infusion at the time of initial Eliquis dose. Pharmacy will sign off and follow remotely.   Ulice Dash, PharmD  02/20/2021, 9:35 AM

## 2021-02-21 DIAGNOSIS — C801 Malignant (primary) neoplasm, unspecified: Secondary | ICD-10-CM

## 2021-02-21 DIAGNOSIS — M7989 Other specified soft tissue disorders: Secondary | ICD-10-CM

## 2021-02-21 DIAGNOSIS — E876 Hypokalemia: Secondary | ICD-10-CM

## 2021-02-21 DIAGNOSIS — I824Y1 Acute embolism and thrombosis of unspecified deep veins of right proximal lower extremity: Secondary | ICD-10-CM | POA: Diagnosis not present

## 2021-02-21 DIAGNOSIS — I2699 Other pulmonary embolism without acute cor pulmonale: Secondary | ICD-10-CM | POA: Diagnosis not present

## 2021-02-21 HISTORY — DX: Malignant (primary) neoplasm, unspecified: C80.1

## 2021-02-21 LAB — BASIC METABOLIC PANEL
Anion gap: 8 (ref 5–15)
BUN: 6 mg/dL — ABNORMAL LOW (ref 8–23)
CO2: 26 mmol/L (ref 22–32)
Calcium: 8.5 mg/dL — ABNORMAL LOW (ref 8.9–10.3)
Chloride: 99 mmol/L (ref 98–111)
Creatinine, Ser: 0.38 mg/dL — ABNORMAL LOW (ref 0.44–1.00)
GFR, Estimated: >60 mL/min (ref >60)
Glucose, Bld: 94 mg/dL (ref 70–99)
Potassium: 2.7 mmol/L — CL (ref 3.5–5.1)
Sodium: 133 mmol/L — ABNORMAL LOW (ref 135–145)

## 2021-02-21 LAB — CBC
HCT: 45 % (ref 36.0–46.0)
Hemoglobin: 15.4 g/dL — ABNORMAL HIGH (ref 12.0–15.0)
MCH: 39.8 pg — ABNORMAL HIGH (ref 26.0–34.0)
MCHC: 34.2 g/dL (ref 30.0–36.0)
MCV: 116.3 fL — ABNORMAL HIGH (ref 80.0–100.0)
Platelets: 197 10*3/uL (ref 150–400)
RBC: 3.87 MIL/uL (ref 3.87–5.11)
RDW: 15 % (ref 11.5–15.5)
WBC: 8.7 10*3/uL (ref 4.0–10.5)
nRBC: 0 % (ref 0.0–0.2)

## 2021-02-21 LAB — CULTURE, RESPIRATORY W GRAM STAIN: Gram Stain: NONE SEEN

## 2021-02-21 MED ORDER — POTASSIUM CHLORIDE CRYS ER 20 MEQ PO TBCR: 20.0000 meq | EXTENDED_RELEASE_TABLET | Freq: Every day | ORAL | 0 refills | Status: DC

## 2021-02-21 MED ORDER — AMOXICILLIN-POT CLAVULANATE 875-125 MG PO TABS
1.0000 | ORAL_TABLET | Freq: Two times a day (BID) | ORAL | Status: DC
  Administered 2021-02-21: 1 via ORAL
  Filled 2021-02-21: qty 1

## 2021-02-21 MED ORDER — AMOXICILLIN-POT CLAVULANATE 875-125 MG PO TABS: 1.0000 | ORAL_TABLET | Freq: Two times a day (BID) | ORAL | 0 refills | Status: DC

## 2021-02-21 MED ORDER — MAGNESIUM OXIDE -MG SUPPLEMENT 400 (240 MG) MG PO TABS: 400.0000 mg | ORAL_TABLET | Freq: Every day | ORAL | Status: AC

## 2021-02-21 MED ORDER — APIXABAN (ELIQUIS) VTE STARTER PACK (10MG AND 5MG): ORAL_TABLET | ORAL | 0 refills | Status: DC

## 2021-02-21 MED ORDER — ALBUTEROL SULFATE HFA 108 (90 BASE) MCG/ACT IN AERS: 2.0000 | INHALATION_SPRAY | Freq: Four times a day (QID) | RESPIRATORY_TRACT | 1 refills | Status: DC | PRN

## 2021-02-21 MED ORDER — POTASSIUM CHLORIDE 10 MEQ/100ML IV SOLN
10.0000 meq | INTRAVENOUS | Status: AC
  Administered 2021-02-21 (×4): 10 meq via INTRAVENOUS
  Filled 2021-02-21 (×2): qty 100

## 2021-02-21 MED ORDER — POTASSIUM CHLORIDE CRYS ER 20 MEQ PO TBCR
40.0000 meq | EXTENDED_RELEASE_TABLET | Freq: Every day | ORAL | Status: DC
  Administered 2021-02-21: 40 meq via ORAL
  Filled 2021-02-21: qty 2

## 2021-02-21 MED ORDER — NICOTINE 14 MG/24HR TD PT24: 14.0000 mg | MEDICATED_PATCH | Freq: Every day | TRANSDERMAL | 0 refills | Status: DC

## 2021-02-21 NOTE — Discharge Summary (Signed)
Physician Discharge Summary  Sue Chase PYP:[id number] DOB: [birth year] DOA: 02/18/2021  PCP: [name], NP  Admit date: 02/18/2021 Discharge date: 02/21/2021  Admitted From: Home  Discharge disposition: Home  Recommendations for Outpatient Follow-Up:   Follow up with your primary care provider in one week.  Check CBC, BMP, magnesium in 3 to 5 days if possible. Patient had hypokalemia which was replenished prior to discharge but this will need to be followed by lab as soon as possible in 3 to 5 days Patient will benefit from chest x-ray as outpatient in 4 to 6 weeks after completion of antibiotic.  Patient will need CT scan of the chest for follow-up of pulmonary nodule in 6 to 12 months.  Would benefit from pulmonary follow-up as outpatient.  Discharge Diagnosis:   Principal Problem:   Acute pulmonary embolism ([place])   Discharge Condition: Improved.  Diet recommendation:   Regular.  Wound care: None.  Code status: Full.   History of Present Illness:   Sue Chase is a 66 y.o. female with past medical history of smoking presented to the hospital with right leg swelling.  Right lower extremity Doppler was positive for DVT.  CTA of the chest was positive for bilateral pulmonary embolism.  Patient was started on heparin drip and was admitted hospital for further evaluation and treatment.    Hospital Course:   Following conditions were addressed during hospitalization as listed below,  B/L Pulmonary Embolism/ RLE DVT Patient was initially on heparin drip which was subsequently changed to Eliquis.  Risk versus benefits of anticoagulation were discussed including precautions on blood thinners.  Respiratory viral panel negative.  Recommendation is 3 to 6 months of anticoagulation with outpatient follow-up.   Hypokalemia Patient was noted to have low potassium at 2.7 today.  Has received 40 mEq of IV potassium and oral 40 mEq today.  She has a strongly  insisted on leaving today since her husband is not doing well and conversation underway regarding hospice.  He was advised to follow-up with her primary care physician to check her blood work in few days.  She will be given potassium supplements on discharge as well.   Hypomagnesemia.  Received magnesium sulfate IV.  Continue magnesium oxide on discharge.  Hyponatremia. Mild.  Sodium prior to discharge was 132.   Incidental right lower lobe ground glass opacity/diffuse groundglass opacity lobe nodule with groundglass opacity  Will need pulmonary follow-up as outpatient for evaluation of groundglass opacity.   Strep urinary antigen negative.  We will continue Augmentin on discharge to complete 7-day course. Patent received Rocephin and Zithromax while in hospital.  She will have to have a repeat chest x-ray as outpatient in 4 to 6 weeks.  Radiology has recommended a CT scan in 6 to 12 months.   Tobacco abuse Continue nicotine patch.  Smoking cessation was emphasized.  Nicotine patch has been prescribed on discharge   Disposition.  At this time, patient is stable for disposition home with outpatient PCP/pulmonary follow-up.  Medical Consultants:   Consultation with pulmonary.  Procedures:    None Subjective:   Today, patient was seen and examined at bedside.  Feels tearful about her husband's declining condition.  States she needs to leave today at any cost.  Feels better with breathing.  Denies any pain nausea or vomiting  Discharge Exam:   Vitals:   02/20/21 2021 02/21/21 0341  BP: (!) 148/78 (!) 142/91  Pulse: 85 91  Resp: 18 20  Temp: 99.1  F (37.3 C) 98.6 F (37 C)  SpO2: 95% 96%   Vitals:   02/20/21 0800 02/20/21 1349 02/20/21 2021 02/21/21 0341  BP:  (!) 149/82 (!) 148/78 (!) 142/91  Pulse:  95 85 91  Resp: 19 20 18 20   Temp:  98.2 F (36.8 C) 99.1 F (37.3 C) 98.6 F (37 C)  TempSrc:  Oral Oral Oral  SpO2:  93% 95% 96%  Weight:      Height:       General:  Alert awake, not in obvious distress, thinly built, tearful and anxious. HENT: pupils equally reacting to light,  No scleral pallor or icterus noted. Oral mucosa is moist.  Chest:   Diminished breath sounds bilaterally. No crackles or wheezes.  CVS: S1 &S2 heard. No murmur.  Regular rate and rhythm. Abdomen: Soft, nontender, nondistended.  Bowel sounds are heard.   Extremities: No cyanosis, clubbing or edema.  Peripheral pulses are palpable. Psych: Alert, awake and oriented, tearful and anxious CNS:  No cranial nerve deficits.  Power equal in all extremities.   Skin: Warm and dry.  No rashes noted.  The results of significant diagnostics from this hospitalization (including imaging, microbiology, ancillary and laboratory) are listed below for reference.     Diagnostic Studies:   DG Chest 2 View  Result Date: 02/18/2021 CLINICAL DATA:  Cough EXAM: CHEST - 2 VIEW COMPARISON:  01/04/2019 FINDINGS: Cardiac size is within normal limits. Low position of diaphragms suggests COPD. New small patchy infiltrate is seen in the posterior right lower lung fields. Linear density in the right upper lung fields has not changed significantly suggesting scarring. Linear density in the right middle lobe has not changed significantly, possibly suggesting scarring. There is no significant pleural effusion or pneumothorax. IMPRESSION: There is new patchy infiltrate in the posterior right lower lung fields suggesting atelectasis/pneumonia. Part of this finding may suggest underlying scarring. COPD. There is no pleural effusion or pneumothorax. Electronically Signed   By: Elmer Picker M.D.   On: 02/18/2021 17:32   CT Angio Chest PE W/Cm &/Or Wo Cm  Result Date: 02/18/2021 CLINICAL DATA:  Concern for pulmonary embolism. EXAM: CT ANGIOGRAPHY CHEST WITH CONTRAST TECHNIQUE: Multidetector CT imaging of the chest was performed using the standard protocol during bolus administration of intravenous contrast.  Multiplanar CT image reconstructions and MIPs were obtained to evaluate the vascular anatomy. CONTRAST:  40mL OMNIPAQUE IOHEXOL 350 MG/ML SOLN COMPARISON:  Chest radiograph dated 02/18/2021. FINDINGS: Cardiovascular: No cardiomegaly or pericardial effusion. There is coronary vascular calcification of the LAD. Low attenuating area along the left anterior wall of the proximal aortic arch likely represents a noncalcified plaque or mural thrombus. Large lobar pulmonary artery embolus involving the left lower lobe branch extending into the segmental and subsegmental branches. Additionally there is occlusive thrombus in the right lower lobe segmental pulmonary artery branch. The right ventricular lumen measures 4.1 cm and the left ventricular lumen measures 4.0 cm for a ratio of approximately 1.0 consistent with right heart straining. Mediastinum/Nodes: No hilar or mediastinal adenopathy. The esophagus is grossly unremarkable. No mediastinal fluid collection. Lungs/Pleura: Patchy area of ground-glass opacity involving the right lung base may represent an area of infarct but pneumonia is not excluded. Additionally there are scattered clusters of ground-glass density throughout the lungs. There is a 1.7 x 3.3 cm cluster of ground-glass density with somewhat irregular and spiculated margins the right upper lobe along the fissure (47/6). Findings may be infectious or inflammatory in etiology or represent malignancy. Multidisciplinary  consult is advised. There is a 5 mm ground-glass nodule in the superior segment of the right lower lobe (55/6). There is a small right pleural effusion. No pneumothorax. The central airways are patent. Upper Abdomen: No acute abnormality. Musculoskeletal: Age indeterminate mild compression fracture of the superior endplate of T9. Correlation with clinical exam and point tenderness recommended. No other acute osseous pathology. Review of the MIP images confirms the above findings. IMPRESSION: 1.  Bilateral lower lobe pulmonary artery emboli with CT evidence of right heart straining (RV/LV Ratio = 1.0) consistent with at least submassive (intermediate risk) PE. The presence of right heart strain has been associated with an increased risk of morbidity and mortality. 2. Right lower lobe ground-glass opacity may represent an area of infarct or infiltrate. 3. Scattered clusters of ground-glass density throughout the lungs with the largest in the right upper lobe demonstrating a somewhat irregular or spiculated margins. Findings may be infectious or inflammatory in etiology or represent malignancy. Multidisciplinary consult is advised. 4. Small right pleural effusion. 5. Age indeterminate mild compression fracture of the superior endplate of T9. Correlation with clinical exam and point tenderness recommended. 6. Aortic Atherosclerosis (ICD10-I70.0). These results were called by telephone at the time of interpretation on 02/18/2021 at 10:07 pm to provider [facility] , who verbally acknowledged these results. Electronically Signed   By: Anner Crete M.D.   On: 02/18/2021 22:10   US Venous Img Lower Unilateral Right (DVT)  Result Date: 02/18/2021 CLINICAL DATA:  Right lower extremity swelling. EXAM: Right LOWER EXTREMITY VENOUS DOPPLER ULTRASOUND TECHNIQUE: Gray-scale sonography with compression, as well as color and duplex ultrasound, were performed to evaluate the deep venous system(s) from the level of the common femoral vein through the popliteal and proximal calf veins. COMPARISON:  None. FINDINGS: VENOUS Echogenic filling defects are demonstrated in the right femoral and popliteal vein as well as at the saphenofemoral junction and muscular perforators. Involved vessels are noncompressible with lack of flow on color flow Doppler imaging. Changes are consistent with acute deep venous thrombosis. The right common femoral vein and posterior tibial vein appear patent. Limited views of the contralateral  common femoral vein are unremarkable. OTHER None. Limitations: none IMPRESSION: Positive examination for acute deep venous thrombosis involving the right femoral and popliteal veins with expansile noncompressible clot demonstrated throughout. Critical Value/emergent results were called by telephone at the time of interpretation on 02/18/2021 at 8:24 pm to provider [facility] , who verbally acknowledged these results. Electronically Signed   By: Lucienne Capers M.D.   On: 02/18/2021 20:29   ECHOCARDIOGRAM COMPLETE  Result Date: 02/19/2021    ECHOCARDIOGRAM REPORT   Patient Name:   [facility] Date of Exam: 02/19/2021 Medical Rec #:  [id number]           Height:       63.0 in Accession #:    [id number]          Weight:       104.0 lb Date of Birth:  March 01, 1955            BSA:          1.464 m Patient Age:    89 years            BP:           129/89 mmHg Patient Gender: F                   HR:  81 bpm. Exam Location:  Inpatient Procedure: 2D Echo, Color Doppler and Cardiac Doppler Indications:    I26.02 Pulmonary embolus  History:        Patient has prior history of Echocardiogram examinations, most                 recent 07/26/2017. Risk Factors:Dyslipidemia.  Sonographer:    Raquel Sarna Senior RDCS Referring Phys: [id number] [place]  1. Left ventricular ejection fraction, by estimation, is 55 to 60%. The left ventricle has normal function. The left ventricle has no regional wall motion abnormalities. Indeterminate diastolic filling due to E-A fusion.  2. Right ventricular systolic function is normal. The right ventricular size is normal. There is normal pulmonary artery systolic pressure. The estimated right ventricular systolic pressure is 17.4 mmHg.  3. The mitral valve is grossly normal. Trivial mitral valve regurgitation. No evidence of mitral stenosis.  4. The aortic valve is grossly normal. Aortic valve regurgitation is not visualized. No aortic stenosis is present.  5. The  inferior vena cava is normal in size with greater than 50% respiratory variability, suggesting right atrial pressure of 3 mmHg. FINDINGS  Left Ventricle: Left ventricular ejection fraction, by estimation, is 55 to 60%. The left ventricle has normal function. The left ventricle has no regional wall motion abnormalities. The left ventricular internal cavity size was normal in size. There is  no left ventricular hypertrophy. Indeterminate diastolic filling due to E-A fusion. Right Ventricle: The right ventricular size is normal. No increase in right ventricular wall thickness. Right ventricular systolic function is normal. There is normal pulmonary artery systolic pressure. The tricuspid regurgitant velocity is 2.23 m/s, and  with an assumed right atrial pressure of 3 mmHg, the estimated right ventricular systolic pressure is 94.4 mmHg. Left Atrium: Left atrial size was normal in size. Right Atrium: Right atrial size was normal in size. Pericardium: There is no evidence of pericardial effusion. Presence of epicardial fat layer. Mitral Valve: The mitral valve is grossly normal. Trivial mitral valve regurgitation. No evidence of mitral valve stenosis. Tricuspid Valve: The tricuspid valve is grossly normal. Tricuspid valve regurgitation is trivial. No evidence of tricuspid stenosis. Aortic Valve: The aortic valve is grossly normal. Aortic valve regurgitation is not visualized. No aortic stenosis is present. Pulmonic Valve: The pulmonic valve was grossly normal. Pulmonic valve regurgitation is not visualized. No evidence of pulmonic stenosis. Aorta: The aortic root and ascending aorta are structurally normal, with no evidence of dilitation. Venous: The inferior vena cava is normal in size with greater than 50% respiratory variability, suggesting right atrial pressure of 3 mmHg. IAS/Shunts: The atrial septum is grossly normal.  LEFT VENTRICLE PLAX 2D LVIDd:         4.60 cm LVIDs:         3.45 cm LV PW:         0.80 cm LV  IVS:        0.70 cm LVOT diam:     1.90 cm LV SV:         55 LV SV Index:   37 LVOT Area:     2.84 cm  RIGHT VENTRICLE RV S prime:     13.40 cm/s TAPSE (M-mode): 2.2 cm LEFT ATRIUM             Index        RIGHT ATRIUM           Index LA diam:        2.80 cm  1.91 cm/m   RA Area:     11.20 cm LA Vol (A2C):   37.4 ml 25.54 ml/m  RA Volume:   23.30 ml  15.91 ml/m LA Vol (A4C):   37.8 ml 25.81 ml/m LA Biplane Vol: 37.8 ml 25.81 ml/m  AORTIC VALVE LVOT Vmax:   95.10 cm/s LVOT Vmean:  66.700 cm/s LVOT VTI:    0.193 m  AORTA Ao Root diam: 2.40 cm Ao Asc diam:  2.90 cm TRICUSPID VALVE TR Peak grad:   19.9 mmHg TR Vmax:        223.00 cm/s  SHUNTS Systemic VTI:  0.19 m Systemic Diam: 1.90 cm Eleonore Chiquito MD Electronically signed by Eleonore Chiquito MD Signature Date/Time: 02/19/2021/1:59:28 PM    Final      Labs:   Basic Metabolic Panel: Recent Labs  Lab 02/18/21 2044 02/19/21 1408 02/20/21 0005 02/21/21 0531  NA 135 132* 131* 133*  K 2.5* 3.0* 3.2* 2.7*  CL 97* 99 99 99  CO2 29 24 24 26   GLUCOSE 88 107* 110* 94  BUN 12 9 6* 6*  CREATININE 0.40* 0.42* 0.39* 0.38*  CALCIUM 8.8* 8.0* 8.3* 8.5*  MG  --  1.4*  --   --    GFR Estimated Creatinine Clearance: 52.2 mL/min (A) (by C-G formula based on SCr of 0.38 mg/dL (L)). Liver Function Tests: Recent Labs  Lab 02/19/21 1408 02/20/21 0005  AST 17 20  ALT 11 10  ALKPHOS 185* 187*  BILITOT 1.8* 1.8*  PROT 6.8 6.1*  ALBUMIN 2.9* 2.7*   No results for input(s): LIPASE, AMYLASE in the last 168 hours. No results for input(s): AMMONIA in the last 168 hours. Coagulation profile Recent Labs  Lab 02/18/21 2220  INR 1.0    CBC: Recent Labs  Lab 02/18/21 2044 02/19/21 1408 02/20/21 0005 02/21/21 0531  WBC 11.1* 10.9* 10.8* 8.7  NEUTROABS 7.2 7.1  --   --   HGB 17.0* 17.1* 16.1* 15.4*  HCT 48.1* 47.8* 45.4 45.0  MCV 112.9* 111.9* 112.9* 116.3*  PLT 214 203 204 197   Cardiac Enzymes: No results for input(s): CKTOTAL, CKMB,  CKMBINDEX, TROPONINI in the last 168 hours. BNP: Invalid input(s): POCBNP CBG: No results for input(s): GLUCAP in the last 168 hours. D-Dimer No results for input(s): DDIMER in the last 72 hours. Hgb A1c No results for input(s): HGBA1C in the last 72 hours. Lipid Profile No results for input(s): CHOL, HDL, LDLCALC, TRIG, CHOLHDL, LDLDIRECT in the last 72 hours. Thyroid function studies No results for input(s): TSH, T4TOTAL, T3FREE, THYROIDAB in the last 72 hours.  Invalid input(s): FREET3 Anemia work up No results for input(s): VITAMINB12, FOLATE, FERRITIN, TIBC, IRON, RETICCTPCT in the last 72 hours. Microbiology Recent Results (from the past 240 hour(s))  Resp Panel by RT-PCR (Flu A&B, Covid) Nasopharyngeal Swab     Status: None   Collection Time: 02/18/21  4:48 PM   Specimen: Nasopharyngeal Swab; Nasopharyngeal(NP) swabs in vial transport medium  Result Value Ref Range Status   SARS Coronavirus 2 by RT PCR NEGATIVE NEGATIVE Final    Comment: (NOTE) SARS-CoV-2 target nucleic acids are NOT DETECTED.  The SARS-CoV-2 RNA is generally detectable in upper respiratory specimens during the acute phase of infection. The lowest concentration of SARS-CoV-2 viral copies this assay can detect is 138 copies/mL. A negative result does not preclude SARS-Cov-2 infection and should not be used as the sole basis for treatment or other patient management decisions. A negative result may occur with  improper specimen collection/handling, submission of specimen other than nasopharyngeal swab, presence of viral mutation(s) within the areas targeted by this assay, and inadequate number of viral copies(<138 copies/mL). A negative result must be combined with clinical observations, patient history, and epidemiological information. The expected result is Negative.  Fact Sheet for Patients:  EntrepreneurPulse.com.au  Fact Sheet for Healthcare Providers:   IncredibleEmployment.be  This test is no t yet approved or cleared by the [place] FDA and  has been authorized for detection and/or diagnosis of SARS-CoV-2 by FDA under an Emergency Use Authorization (EUA). This EUA will remain  in effect (meaning this test can be used) for the duration of the COVID-19 declaration under Section 564(b)(1) of the Act, 21 U.S.C.section 360bbb-3(b)(1), unless the authorization is terminated  or revoked sooner.       Influenza A by PCR NEGATIVE NEGATIVE Final   Influenza B by PCR NEGATIVE NEGATIVE Final    Comment: (NOTE) The Xpert Xpress SARS-CoV-2/FLU/RSV plus assay is intended as an aid in the diagnosis of influenza from Nasopharyngeal swab specimens and should not be used as a sole basis for treatment. Nasal washings and aspirates are unacceptable for Xpert Xpress SARS-CoV-2/FLU/RSV testing.  Fact Sheet for Patients: EntrepreneurPulse.com.au  Fact Sheet for Healthcare Providers: IncredibleEmployment.be  This test is not yet approved or cleared by the [place] FDA and has been authorized for detection and/or diagnosis of SARS-CoV-2 by FDA under an Emergency Use Authorization (EUA). This EUA will remain in effect (meaning this test can be used) for the duration of the COVID-19 declaration under Section 564(b)(1) of the Act, 21 U.S.C. section 360bbb-3(b)(1), unless the authorization is terminated or revoked.  Performed at [facility], [address] [place], [place] [address]   Respiratory (~20 pathogens) panel by PCR     Status: None   Collection Time: 02/19/21 12:41 PM   Specimen: Nasopharyngeal Swab; Respiratory  Result Value Ref Range Status   Adenovirus NOT DETECTED NOT DETECTED Final   Coronavirus 229E NOT DETECTED NOT DETECTED Final    Comment: (NOTE) The Coronavirus on the Respiratory Panel, DOES NOT test for the novel  Coronavirus (2019 nCoV)     Coronavirus HKU1 NOT DETECTED NOT DETECTED Final   Coronavirus NL63 NOT DETECTED NOT DETECTED Final   Coronavirus OC43 NOT DETECTED NOT DETECTED Final   Metapneumovirus NOT DETECTED NOT DETECTED Final   Rhinovirus / Enterovirus NOT DETECTED NOT DETECTED Final   Influenza A NOT DETECTED NOT DETECTED Final   Influenza B NOT DETECTED NOT DETECTED Final   Parainfluenza Virus 1 NOT DETECTED NOT DETECTED Final   Parainfluenza Virus 2 NOT DETECTED NOT DETECTED Final   Parainfluenza Virus 3 NOT DETECTED NOT DETECTED Final   Parainfluenza Virus 4 NOT DETECTED NOT DETECTED Final   Respiratory Syncytial Virus NOT DETECTED NOT DETECTED Final   Bordetella pertussis NOT DETECTED NOT DETECTED Final   Bordetella Parapertussis NOT DETECTED NOT DETECTED Final   Chlamydophila pneumoniae NOT DETECTED NOT DETECTED Final   Mycoplasma pneumoniae NOT DETECTED NOT DETECTED Final    Comment: Performed at [facility], [place]. [address], [place], [place] [address]  Expectorated Sputum Assessment w Gram Stain, Rflx to Resp Cult     Status: None   Collection Time: 02/19/21  1:43 PM   Specimen: Sputum  Result Value Ref Range Status   Specimen Description SPUTUM  Final   Special Requests NONE  Final   Sputum evaluation   Final    THIS SPECIMEN IS ACCEPTABLE  FOR SPUTUM CULTURE Performed at [facility], [place] [address] [place], [place] [address]    Report Status 02/19/2021 FINAL  Final  Culture, Respiratory w Gram Stain     Status: None   Collection Time: 02/19/21  1:43 PM   Specimen: SPU  Result Value Ref Range Status   Specimen Description   Final    SPUTUM Performed at [facility] [address] [place], [place] [address]    Special Requests   Final    NONE Reflexed from [id number] Performed at [facility] [address] [place], [place] [address]    Gram Stain   Final    NO SQUAMOUS EPITHELIAL CELLS SEEN MODERATE WBC SEEN MODERATE GRAM  POSITIVE COCCI    Culture   Final    ABUNDANT MORAXELLA CATARRHALIS(BRANHAMELLA) BETA LACTAMASE POSITIVE Performed at [facility], [place] [address], [place], [place] [address]    Report Status 02/21/2021 FINAL  Final     Discharge Instructions:   Discharge Instructions     Call MD for:  difficulty breathing, headache or visual disturbances   Complete by: As directed    Call MD for:  severe uncontrolled pain   Complete by: As directed    Call MD for:  temperature >100.4   Complete by: As directed    Diet - low sodium heart healthy   Complete by: As directed    Discharge instructions   Complete by: As directed    Follow-up with your primary care physician within 1 week.  Please do not smoke.  Complete the course of antibiotic.  You will benefit from following up with pulmonary physician as outpatient for pulmonary nodule in the x-ray.  Continue blood thinners as prescribed.  Take precautions while you are on a blood thinner. Check blood work in 3-5 days if possible.   Increase activity slowly   Complete by: As directed       Allergies as of 02/21/2021       Reactions   Codeine Itching        Medication List     STOP taking these medications    ibuprofen 400 MG tablet Commonly known as: ADVIL       TAKE these medications    acetaminophen 325 MG tablet Commonly known as: TYLENOL Take 2 tablets (650 mg total) by mouth in the morning, at noon, in the evening, and at bedtime.   albuterol 108 (90 Base) MCG/ACT inhaler Commonly known as: VENTOLIN HFA Inhale 2 puffs into the lungs every 6 (six) hours as needed for wheezing or shortness of breath.   amoxicillin-clavulanate 875-125 MG tablet Commonly known as: AUGMENTIN Take 1 tablet by mouth every 12 (twelve) hours for 5 days.   Apixaban Starter Pack (10mg  and 5mg ) Commonly known as: ELIQUIS STARTER PACK Take as directed on package: start with two-5mg  tablets twice daily for 7 days. On day 8, switch to  one-5mg  tablet twice daily.   magnesium oxide 400 (240 Mg) MG tablet Commonly known as: MAG-OX Take 1 tablet (400 mg total) by mouth daily.   nicotine 14 mg/24hr patch Commonly known as: NICODERM CQ - dosed in mg/24 hours Place 1 patch (14 mg total) onto the skin daily.   omeprazole 20 MG capsule Commonly known as: PRILOSEC Take 1 capsule (20 mg total) by mouth daily.   potassium chloride SA 20 MEQ tablet Commonly known as: KLOR-CON M Take 1 tablet (20 mEq total) by mouth daily  for 5 days. Start taking on: February 22, 2021        Follow-up Information     Nafziger, Tommi Rumps, NP Follow up in 1 week(s).   Specialty: Family Medicine Contact information: [place] [address] [phone]                  Time coordinating discharge: 39 minutes  Signed:  Bodhi Moradi  [facility] 02/21/2021, 12:14 PM

## 2021-02-21 NOTE — Plan of Care (Signed)
  Problem: Health Behavior/Discharge Planning: Goal: Ability to manage health-related needs will improve Outcome: Progressing   Problem: Clinical Measurements: Goal: Diagnostic test results will improve Outcome: Progressing   Problem: Clinical Measurements: Goal: Respiratory complications will improve Outcome: Progressing   

## 2021-02-21 NOTE — Plan of Care (Signed)
°  Problem: Health Behavior/Discharge Planning: Goal: Ability to manage health-related needs will improve 02/21/2021 1243 by Albina Billet, RN Outcome: Adequate for Discharge 02/21/2021 0835 by Albina Billet, RN Outcome: Progressing

## 2021-02-22 LAB — LEGIONELLA PNEUMOPHILA SEROGP 1 UR AG: L. pneumophila Serogp 1 Ur Ag: NEGATIVE

## 2021-02-23 NOTE — Telephone Encounter (Signed)
Left voicemail for patient to call back to schedule consultation.

## 2021-02-25 LAB — ACID FAST SMEAR (AFB, MYCOBACTERIA): Acid Fast Smear: NEGATIVE

## 2021-02-26 ENCOUNTER — Ambulatory Visit (INDEPENDENT_AMBULATORY_CARE_PROVIDER_SITE_OTHER): Payer: BC Managed Care – PPO | Admitting: Adult Health

## 2021-02-26 ENCOUNTER — Encounter: Payer: Self-pay | Admitting: Adult Health

## 2021-02-26 VITALS — BP 138/78 | HR 109 | Temp 98.1°F | Ht 63.0 in | Wt 101.0 lb

## 2021-02-26 DIAGNOSIS — I2699 Other pulmonary embolism without acute cor pulmonale: Secondary | ICD-10-CM

## 2021-02-26 DIAGNOSIS — E871 Hypo-osmolality and hyponatremia: Secondary | ICD-10-CM | POA: Diagnosis not present

## 2021-02-26 DIAGNOSIS — R918 Other nonspecific abnormal finding of lung field: Secondary | ICD-10-CM

## 2021-02-26 DIAGNOSIS — E876 Hypokalemia: Secondary | ICD-10-CM | POA: Diagnosis not present

## 2021-02-26 DIAGNOSIS — Z720 Tobacco use: Secondary | ICD-10-CM

## 2021-02-26 LAB — CBC WITH DIFFERENTIAL/PLATELET
Basophils Absolute: 0.1 10*3/uL (ref 0.0–0.1)
Basophils Relative: 0.6 % (ref 0.0–3.0)
Eosinophils Absolute: 0.1 10*3/uL (ref 0.0–0.7)
Eosinophils Relative: 0.5 % (ref 0.0–5.0)
HCT: 51 % — ABNORMAL HIGH (ref 36.0–46.0)
Hemoglobin: 17.1 g/dL — ABNORMAL HIGH (ref 12.0–15.0)
Lymphocytes Relative: 10.1 % — ABNORMAL LOW (ref 12.0–46.0)
Lymphs Abs: 1.2 10*3/uL (ref 0.7–4.0)
MCHC: 33.5 g/dL (ref 30.0–36.0)
MCV: 116.2 fl — ABNORMAL HIGH (ref 78.0–100.0)
Monocytes Absolute: 1.1 10*3/uL — ABNORMAL HIGH (ref 0.1–1.0)
Monocytes Relative: 8.7 % (ref 3.0–12.0)
Neutro Abs: 9.9 10*3/uL — ABNORMAL HIGH (ref 1.4–7.7)
Neutrophils Relative %: 80.1 % — ABNORMAL HIGH (ref 43.0–77.0)
Platelets: 442 10*3/uL — ABNORMAL HIGH (ref 150.0–400.0)
RBC: 4.39 Mil/uL (ref 3.87–5.11)
RDW: 16.7 % — ABNORMAL HIGH (ref 11.5–15.5)
WBC: 12.4 10*3/uL — ABNORMAL HIGH (ref 4.0–10.5)

## 2021-02-26 LAB — BASIC METABOLIC PANEL
BUN: 14 mg/dL (ref 6–23)
CO2: 29 mEq/L (ref 19–32)
Calcium: 10.2 mg/dL (ref 8.4–10.5)
Chloride: 95 mEq/L — ABNORMAL LOW (ref 96–112)
Creatinine, Ser: 0.58 mg/dL (ref 0.40–1.20)
GFR: 94.96 mL/min (ref >60.00)
Glucose, Bld: 95 mg/dL (ref 70–99)
Potassium: 4.6 mEq/L (ref 3.5–5.1)
Sodium: 133 mEq/L — ABNORMAL LOW (ref 135–145)

## 2021-02-26 LAB — MAGNESIUM: Magnesium: 1.9 mg/dL (ref 1.5–2.5)

## 2021-02-26 NOTE — Progress Notes (Signed)
Subjective:    Patient ID: Sue Chase, female    DOB: 12/25/1955, 66 y.o.   MRN: [id number]  HPI 66 year old female who  has a past medical history of Colon polyps, Hyperlipidemia, Rheumatic fever, Seasonal allergies, and UTI (urinary tract infection).  She presents to the office today for TCM visit   Admit Date12/29/2022 Discharge Date 02/21/2021  She presented to the emergency room with right leg swelling.  Her right lower extremity Doppler was positive for DVT.  CTA of the chest was positive for bilateral pulmonary embolism.  She was started on a heparin drip and admitted to the hospital for further evaluation and treatment.  Hospital Course  B/L Pulmonary Embolism/RLE DVT -She was initially started on a heparin drip which was subsequently changed to Eliquis.  Her respiratory viral panel was negative.  Was recommended 3 to 6 months of anticoagulation with outpatient work-up.    Hypokalemia -She was noted to have potassium level of 2.7 on hospital admission.  She received 40 mill equivalents of IV potassium and oral 40 mEq daily.  On the day of discharge she strongly insisted on leaving since her husband was not doing well with pancreatic cancer and they were discussing hospice.  She was advised to follow-up with PCP to recheck blood work in a few days.  She was given potassium supplements on discharge  Hypomagnesemia  -Deceived IV magnesium.  She was advised to continue magnesium oxide on discharge  Hyponatremia -Mild.  Sodium prior to discharge was 132  Incidental right lower lobe groundglass opacity -Strep urinary antigen was negative.  She was placed on Augmentin on discharge to complete a 7-day course.  She received Rocephin and Zithromax while in the hospital.  Advised to have a repeat chest x-ray as outpatient in 4 to 6 weeks and repeat CT scan in 6 to 12 months.  She is also to follow-up with pulmonary  Tobacco abuse -Continue nicotine patch.  Smoking cessation was  emphasized.  Today she reports that she is feeling better since being discharged from the hospital.  She is finishing her Eliquis starter pack and has finished her antibiotic therapy.  She has an appointment with pulmonary later this month.  She denies shortness of breath or chest pain since being discharged.  She does not have any family history of blood clots that she knows of and did not have any episodes of long travel prior to DVT/PE.  Does report staying pretty active and walking a lot.  The swelling in her right lower extremity has improved significantly "gone down about 50%".  She is tearful through most of the exam today due to her husband's declining health.   Review of Systems  Constitutional: Negative.   HENT: Negative.    Eyes: Negative.   Respiratory: Negative.    Cardiovascular: Negative.   Gastrointestinal: Negative.   Endocrine: Negative.   Genitourinary: Negative.   Musculoskeletal: Negative.   Skin: Negative.   Allergic/Immunologic: Negative.   Neurological: Negative.   Hematological: Negative.   Psychiatric/Behavioral: Negative.     Past Medical History:  Diagnosis Date   Colon polyps    Hyperlipidemia    Rheumatic fever    Seasonal allergies    UTI (urinary tract infection)     Social History   Socioeconomic History   Marital status: Married    Spouse name: Not on file   Number of children: Not on file   Years of education: Not on file   Highest education  level: Not on file  Occupational History   Not on file  Tobacco Use   Smoking status: Every Day    Packs/day: 1.00    Years: 40.00    Pack years: 40.00    Types: Cigarettes   Smokeless tobacco: Never  Vaping Use   Vaping Use: Never used  Substance and Sexual Activity   Alcohol use: Yes    Comment: Rum and Coke/Unsure of amount   Drug use: Never   Sexual activity: Not on file  Other Topics Concern   Not on file  Social History Narrative   Married    Two grown children    She enjoys  reading, walking   Social Determinants of Radio broadcast assistant Strain: Not on file  Food Insecurity: Not on file  Transportation Needs: Not on file  Physical Activity: Not on file  Stress: Not on file  Social Connections: Not on file  Intimate Partner Violence: Not on file    Past Surgical History:  Procedure Laterality Date   SALIVARY GLAND SURGERY     LATE 80S   TUBAL LIGATION  1986    Family History  Problem Relation Age of Onset   Arthritis Mother    Diabetes Mother    Heart disease Mother    Hyperlipidemia Mother    Hypertension Mother    Miscarriages / Stillbirths Mother    Hearing loss Father    Liver cancer Father    Lung cancer Father    Hyperlipidemia Father    Diabetes Brother    Lung disease Maternal Grandfather        Black Lung   Diabetes Paternal Grandmother    Cancer Paternal Grandfather    Diabetes Paternal Grandfather     Allergies  Allergen Reactions   Codeine Itching    Current Outpatient Medications on File Prior to Visit  Medication Sig Dispense Refill   acetaminophen (TYLENOL) 325 MG tablet Take 2 tablets (650 mg total) by mouth in the morning, at noon, in the evening, and at bedtime. 30 tablet 0   albuterol (VENTOLIN HFA) 108 (90 Base) MCG/ACT inhaler Inhale 2 puffs into the lungs every 6 (six) hours as needed for wheezing or shortness of breath. 8 g 1   APIXABAN (ELIQUIS) VTE STARTER PACK (10MG  AND 5MG ) Take as directed on package: start with two-5mg  tablets twice daily for 7 days. On day 8, switch to one-5mg  tablet twice daily. 1 each 0   magnesium oxide (MAG-OX) 400 (240 Mg) MG tablet Take 1 tablet (400 mg total) by mouth daily.     nicotine (NICODERM CQ - DOSED IN MG/24 HOURS) 14 mg/24hr patch Place 1 patch (14 mg total) onto the skin daily. 28 patch 0   omeprazole (PRILOSEC) 20 MG capsule Take 1 capsule (20 mg total) by mouth daily. 30 capsule 3   potassium chloride SA (KLOR-CON M) 20 MEQ tablet Take 1 tablet (20 mEq total) by  mouth daily for 5 days. 5 tablet 0   No current facility-administered medications on file prior to visit.    BP 138/78    Pulse (!) 109    Temp 98.1 F (36.7 C) (Oral)    Ht 5\' 3"  (1.6 m)    Wt 101 lb (45.8 kg)    SpO2 96%    BMI 17.89 kg/m       Objective:   Physical Exam Vitals and nursing note reviewed.  Constitutional:      Appearance: Normal appearance.  Cardiovascular:  Rate and Rhythm: Normal rate and regular rhythm. Occasional Extrasystoles are present.    Pulses: Normal pulses.     Heart sounds: Normal heart sounds.  Pulmonary:     Effort: Pulmonary effort is normal.     Breath sounds: Normal breath sounds.  Musculoskeletal:        General: Normal range of motion.     Right lower leg: Edema present.     Left lower leg: Edema present.  Skin:    General: Skin is warm and dry.  Neurological:     General: No focal deficit present.     Mental Status: She is alert and oriented to person, place, and time.  Psychiatric:        Mood and Affect: Mood normal. Affect is tearful.        Behavior: Behavior normal.        Thought Content: Thought content normal.        Judgment: Judgment normal.      Assessment & Plan:  1. Acute pulmonary embolism, unspecified pulmonary embolism type, unspecified whether acute cor pulmonale present ([place]) -Her husband who was on Eliquis at one time but then transition to [place] and has a full bottle of Eliquis 5 mg at home.  Due to cost of Eliquis for her, she will use her husband's Eliquis prescription and then follow-up with me when she needs a new refill.  Will refer to hematology as well.  Likely 6 months of anticoagulation. - EKG 12-Lead- Sinus  Rhythm  - frequent PAC s  # PACs = 2. Rate 93.  - Basic Metabolic Panel; Future - CBC with Differential/Platelet; Future - Magnesium; Future - Magnesium - CBC with Differential/Platelet - Basic Metabolic Panel - Ambulatory referral to Hematology / Oncology  2. Hypokalemia - consider  continuing K supplement  - Basic Metabolic Panel; Future - CBC with Differential/Platelet; Future - Magnesium; Future - Magnesium - CBC with Differential/Platelet - Basic Metabolic Panel  3. Hypomagnesemia - Continue mag supplement  - Basic Metabolic Panel; Future - CBC with Differential/Platelet; Future - Magnesium; Future - Magnesium - CBC with Differential/Platelet - Basic Metabolic Panel  4. Hyponatremia  - Basic Metabolic Panel; Future - CBC with Differential/Platelet; Future - Magnesium; Future - Magnesium - CBC with Differential/Platelet - Basic Metabolic Panel  5. Ground glass opacity present on imaging of lung - Follow up with Pulmonary as directed  6. Tobacco use - continue with nicotine patch. Smoking cessation encouraged  [name], NP

## 2021-03-02 NOTE — Telephone Encounter (Signed)
Patient is scheduled on 03/12/2021 at 3pm with Dr. Valeta Harms. Nothing further needed.

## 2021-03-03 ENCOUNTER — Telehealth: Payer: Self-pay | Admitting: Adult Health

## 2021-03-03 ENCOUNTER — Other Ambulatory Visit: Payer: Self-pay | Admitting: Adult Health

## 2021-03-03 DIAGNOSIS — D72829 Elevated white blood cell count, unspecified: Secondary | ICD-10-CM

## 2021-03-03 DIAGNOSIS — E876 Hypokalemia: Secondary | ICD-10-CM

## 2021-03-03 MED ORDER — POTASSIUM CHLORIDE CRYS ER 20 MEQ PO TBCR: 20.0000 meq | EXTENDED_RELEASE_TABLET | Freq: Every day | ORAL | 3 refills | Status: DC

## 2021-03-03 NOTE — Telephone Encounter (Signed)
[facility]  is calling and needs clarification  on potassium the qty and directions. It said take  1 tablet daily by mouth  for 5 day with qty on [address] [facility] [id number] [address] [place], [place] Phone:  [phone]  Fax:  [phone]

## 2021-03-09 ENCOUNTER — Other Ambulatory Visit: Payer: Self-pay | Admitting: Adult Health

## 2021-03-09 DIAGNOSIS — E876 Hypokalemia: Secondary | ICD-10-CM

## 2021-03-09 MED ORDER — POTASSIUM CHLORIDE CRYS ER 20 MEQ PO TBCR: 20.0000 meq | EXTENDED_RELEASE_TABLET | Freq: Every day | ORAL | 3 refills | Status: DC

## 2021-03-10 ENCOUNTER — Telehealth: Payer: Self-pay

## 2021-03-10 NOTE — Telephone Encounter (Signed)
Transition Care Management Follow-up Telephone Call Date of discharge and from where: 02/21/21 from [facility] How have you been since you were released from the hospital? "I feel ok" Any questions or concerns? No  Items Reviewed: Did the pt receive and understand the discharge instructions provided? Yes  Medications obtained and verified? Yes  Other? No  Any new allergies since your discharge? No  Dietary orders reviewed? Yes Do you have support at home? Yes   Home Care and Equipment/Supplies: Were home health services ordered? not applicable If so, what is the name of the agency? Not applicable  Has the agency set up a time to come to the patient's home? not applicable Were any new equipment or medical supplies ordered?  No What is the name of the medical supply agency? Not applicable Were you able to get the supplies/equipment? not applicable Do you have any questions related to the use of the equipment or supplies? No  Functional Questionnaire: (I = Independent and D = Dependent) ADLs:  I  Bathing/Dressing- I  Meal Prep- I  Eating- I  Maintaining continence- I  Transferring/Ambulation- I  Managing Meds- I  Follow up appointments reviewed:  PCP Hospital f/u appt confirmed? Yes  Had follow up with [name], NP on 02/26/21 . [facility] f/u appt confirmed? Yes  Scheduled to see Dr. Leory Plowman Icard on 03/12/21 @ 3 pm.  Are transportation arrangements needed? No  If their condition worsens, is the pt aware to call PCP or go to the Emergency Dept.? Yes Was the patient provided with contact information for the PCP's office or ED? Yes Was to pt encouraged to call back with questions or concerns? Yes   Thea Silversmith, RN, MSN, BSN, [place] Care Management Coordinator [phone]

## 2021-03-11 ENCOUNTER — Other Ambulatory Visit (INDEPENDENT_AMBULATORY_CARE_PROVIDER_SITE_OTHER): Payer: BC Managed Care – PPO

## 2021-03-11 DIAGNOSIS — D72829 Elevated white blood cell count, unspecified: Secondary | ICD-10-CM

## 2021-03-11 DIAGNOSIS — E876 Hypokalemia: Secondary | ICD-10-CM | POA: Diagnosis not present

## 2021-03-11 LAB — CBC WITH DIFFERENTIAL/PLATELET
Basophils Absolute: 0.1 10*3/uL (ref 0.0–0.1)
Basophils Relative: 0.8 % (ref 0.0–3.0)
Eosinophils Absolute: 0.3 10*3/uL (ref 0.0–0.7)
Eosinophils Relative: 3.6 % (ref 0.0–5.0)
HCT: 50.7 % — ABNORMAL HIGH (ref 36.0–46.0)
Hemoglobin: 16.8 g/dL — ABNORMAL HIGH (ref 12.0–15.0)
Lymphocytes Relative: 26.2 % (ref 12.0–46.0)
Lymphs Abs: 2.1 10*3/uL (ref 0.7–4.0)
MCHC: 33.1 g/dL (ref 30.0–36.0)
MCV: 112.8 fl — ABNORMAL HIGH (ref 78.0–100.0)
Monocytes Absolute: 0.9 10*3/uL (ref 0.1–1.0)
Monocytes Relative: 10.6 % (ref 3.0–12.0)
Neutro Abs: 4.8 10*3/uL (ref 1.4–7.7)
Neutrophils Relative %: 58.8 % (ref 43.0–77.0)
Platelets: 319 10*3/uL (ref 150.0–400.0)
RBC: 4.5 Mil/uL (ref 3.87–5.11)
RDW: 17.4 % — ABNORMAL HIGH (ref 11.5–15.5)
WBC: 8.1 10*3/uL (ref 4.0–10.5)

## 2021-03-11 LAB — BASIC METABOLIC PANEL
BUN: 10 mg/dL (ref 6–23)
CO2: 30 mEq/L (ref 19–32)
Calcium: 9.3 mg/dL (ref 8.4–10.5)
Chloride: 99 mEq/L (ref 96–112)
Creatinine, Ser: 0.6 mg/dL (ref 0.40–1.20)
GFR: 94.16 mL/min (ref >60.00)
Glucose, Bld: 79 mg/dL (ref 70–99)
Potassium: 3.8 mEq/L (ref 3.5–5.1)
Sodium: 138 mEq/L (ref 135–145)

## 2021-03-12 ENCOUNTER — Encounter: Payer: BC Managed Care – PPO | Admitting: *Deleted

## 2021-03-12 ENCOUNTER — Ambulatory Visit (INDEPENDENT_AMBULATORY_CARE_PROVIDER_SITE_OTHER): Payer: BC Managed Care – PPO | Admitting: Pulmonary Disease

## 2021-03-12 ENCOUNTER — Encounter: Payer: Self-pay | Admitting: Pulmonary Disease

## 2021-03-12 ENCOUNTER — Other Ambulatory Visit: Payer: Self-pay

## 2021-03-12 VITALS — BP 136/82 | HR 77 | Temp 97.8°F | Ht 63.0 in | Wt 103.0 lb

## 2021-03-12 DIAGNOSIS — Z006 Encounter for examination for normal comparison and control in clinical research program: Secondary | ICD-10-CM

## 2021-03-12 DIAGNOSIS — R918 Other nonspecific abnormal finding of lung field: Secondary | ICD-10-CM

## 2021-03-12 DIAGNOSIS — I2609 Other pulmonary embolism with acute cor pulmonale: Secondary | ICD-10-CM | POA: Diagnosis not present

## 2021-03-12 DIAGNOSIS — R911 Solitary pulmonary nodule: Secondary | ICD-10-CM | POA: Diagnosis not present

## 2021-03-12 NOTE — Progress Notes (Signed)
Synopsis: Referred in Jan 2023 for lung nodules by [name], NP  Subjective:   PATIENT ID: Sue Chase GENDER: female DOB: 08-30-1955, MRN: [id number]  Chief Complaint  Patient presents with   Consult    Patient is here to talk about spots on lungs.     This is a 66 year old female, past medical history of hyperlipidemia, seasonal allergies.  Longstanding history of smoking since a teenager.  Patient this past fall her husband was diagnosed pancreatic cancer she had been going in and out of the hospital.  Unfortunately he passed from his disease she developed swelling and a lower extremity she continued to walk on it and had continued swelling in the right leg.  Ultimately she came to the ER had ultrasound which confirmed a DVT.  CT scan of the chest revealed submassive PE.  She was placed on anticoagulation at discharge.  CT scan of the chest at the time also revealed multiple areas of pulmonary nodules.  She had the spiculated larger lesion in the right upper lobe adjacent to the major fissure that also had some puckering and pulling of the major fissure line.  She was referred here for evaluation of potential concern for underlying malignancy.   Past Medical History:  Diagnosis Date   Colon polyps    Hyperlipidemia    Rheumatic fever    Seasonal allergies    UTI (urinary tract infection)      Family History  Problem Relation Age of Onset   Arthritis Mother    Diabetes Mother    Heart disease Mother    Hyperlipidemia Mother    Hypertension Mother    60 / [place] Mother    Hearing loss Father    Liver cancer Father    Lung cancer Father    Hyperlipidemia Father    Diabetes Brother    Lung disease Maternal Grandfather        Black Lung   Diabetes Paternal Grandmother    Cancer Paternal Grandfather    Diabetes Paternal Grandfather      Past Surgical History:  Procedure Laterality Date   SALIVARY GLAND SURGERY     LATE 80S   TUBAL LIGATION   1986    Social History   Socioeconomic History   Marital status: Married    Spouse name: Not on file   Number of children: Not on file   Years of education: Not on file   Highest education level: Not on file  Occupational History   Not on file  Tobacco Use   Smoking status: Every Day    Packs/day: 1.00    Years: 40.00    Pack years: 40.00    Types: Cigarettes   Smokeless tobacco: Never  Vaping Use   Vaping Use: Never used  Substance and Sexual Activity   Alcohol use: Yes    Comment: Rum and Coke/Unsure of amount   Drug use: Never   Sexual activity: Not on file  Other Topics Concern   Not on file  Social History Narrative   Married    Two grown children    She enjoys reading, walking   Social Determinants of Health   Financial Resource Strain: Not on file  Food Insecurity: Not on file  Transportation Needs: Not on file  Physical Activity: Not on file  Stress: Not on file  Social Connections: Not on file  Intimate Partner Violence: Not on file     Allergies  Allergen Reactions   Codeine Itching  Outpatient Medications Prior to Visit  Medication Sig Dispense Refill   acetaminophen (TYLENOL) 325 MG tablet Take 2 tablets (650 mg total) by mouth in the morning, at noon, in the evening, and at bedtime. 30 tablet 0   albuterol (VENTOLIN HFA) 108 (90 Base) MCG/ACT inhaler Inhale 2 puffs into the lungs every 6 (six) hours as needed for wheezing or shortness of breath. 8 g 1   APIXABAN (ELIQUIS) VTE STARTER PACK (10MG  AND 5MG ) Take as directed on package: start with two-5mg  tablets twice daily for 7 days. On day 8, switch to one-5mg  tablet twice daily. 1 each 0   magnesium oxide (MAG-OX) 400 (240 Mg) MG tablet Take 1 tablet (400 mg total) by mouth daily.     nicotine (NICODERM CQ - DOSED IN MG/24 HOURS) 14 mg/24hr patch Place 1 patch (14 mg total) onto the skin daily. 28 patch 0   omeprazole (PRILOSEC) 20 MG capsule Take 1 capsule (20 mg total) by mouth daily. 30  capsule 3   potassium chloride SA (KLOR-CON M) 20 MEQ tablet Take 1 tablet (20 mEq total) by mouth daily. 90 tablet 3   No facility-administered medications prior to visit.    Review of Systems  Constitutional:  Negative for chills, fever, malaise/fatigue and weight loss.  HENT:  Negative for hearing loss, sore throat and tinnitus.   Eyes:  Negative for blurred vision and double vision.  Respiratory:  Positive for shortness of breath. Negative for cough, hemoptysis, sputum production, wheezing and stridor.   Cardiovascular:  Negative for chest pain, palpitations, orthopnea, leg swelling and PND.  Gastrointestinal:  Negative for abdominal pain, constipation, diarrhea, heartburn, nausea and vomiting.  Genitourinary:  Negative for dysuria, hematuria and urgency.  Musculoskeletal:  Negative for joint pain and myalgias.  Skin:  Negative for itching and rash.  Neurological:  Negative for dizziness, tingling, weakness and headaches.  Endo/Heme/Allergies:  Negative for environmental allergies. Does not bruise/bleed easily.  Psychiatric/Behavioral:  Negative for depression. The patient is not nervous/anxious and does not have insomnia.   All other systems reviewed and are negative.   Objective:  Physical Exam Vitals reviewed.  Constitutional:      General: She is not in acute distress.    Appearance: She is well-developed.     Comments: Elderly thin frail appearing  HENT:     Head: Normocephalic and atraumatic.  Eyes:     General: No scleral icterus.    Conjunctiva/sclera: Conjunctivae normal.     Pupils: Pupils are equal, round, and reactive to light.  Neck:     Vascular: No JVD.     Trachea: No tracheal deviation.  Cardiovascular:     Rate and Rhythm: Normal rate and regular rhythm.     Heart sounds: Normal heart sounds. No murmur heard. Pulmonary:     Effort: Pulmonary effort is normal. No tachypnea, accessory muscle usage or respiratory distress.     Breath sounds: No stridor.  No wheezing, rhonchi or rales.     Comments: Diminished breath sounds bilaterally no wheeze Abdominal:     General: There is no distension.     Palpations: Abdomen is soft.     Tenderness: There is no abdominal tenderness.  Musculoskeletal:        General: No tenderness.     Cervical back: Neck supple.  Lymphadenopathy:     Cervical: No cervical adenopathy.  Skin:    General: Skin is warm and dry.     Capillary Refill: Capillary refill takes less  than 2 seconds.     Findings: No rash.  Neurological:     Mental Status: She is alert and oriented to person, place, and time.  Psychiatric:        Behavior: Behavior normal.     Vitals:   03/12/21 1454  BP: 136/82  Pulse: 77  Temp: 97.8 F (36.6 C)  TempSrc: Oral  SpO2: 96%  Weight: 103 lb (46.7 kg)  Height: 5\' 3"  (1.6 m)   96% on RA BMI Readings from Last 3 Encounters:  03/12/21 18.25 kg/m  02/26/21 17.89 kg/m  02/18/21 18.42 kg/m   Wt Readings from Last 3 Encounters:  03/12/21 103 lb (46.7 kg)  02/26/21 101 lb (45.8 kg)  02/18/21 104 lb (47.2 kg)     CBC    Component Value Date/Time   WBC 8.1 03/11/2021 1052   RBC 4.50 03/11/2021 1052   HGB 16.8 (H) 03/11/2021 1052   HCT 50.7 (H) 03/11/2021 1052   PLT 319.0 03/11/2021 1052   MCV 112.8 (H) 03/11/2021 1052   MCH 39.8 (H) 02/21/2021 0531   MCHC 33.1 03/11/2021 1052   RDW 17.4 (H) 03/11/2021 1052   LYMPHSABS 2.1 03/11/2021 1052   MONOABS 0.9 03/11/2021 1052   EOSABS 0.3 03/11/2021 1052   BASOSABS 0.1 03/11/2021 1052     Chest Imaging: 02/18/2021: CT chest Bilateral lower lobe pulmonary emboli with right ventricular strain. Groundglass areas throughout the lung lower lobe concerning for infiltrate such as pneumonia.  Less likely to be infarct to me. Scattered nodules throughout the lungs she has a larger irregular spiculated groundglass subsolid nodule that is approximately 1.7 cm in the right upper lobe. Additionally she has a subsolid lesion in the  right lower lobe that I think is also concerning for malignancy.  The patient's images have been independently reviewed by me.    Pulmonary Functions Testing Results: No flowsheet data found.  FeNO:   Pathology:   Echocardiogram:   Heart Catheterization:     Assessment & Plan:     ICD-10-CM   1. Lung nodule  R91.1 CT Super D Chest Wo Contrast    2. Multiple pulmonary nodules  R91.8     3. Ground glass opacity present on imaging of lung  R91.8     4. Other acute pulmonary embolism with acute cor pulmonale (HCC)  I26.09       Discussion:  This is a 66 year old female here today for evaluation of multiple pulmonary nodules, recent CT scan of the chest with acute pulmonary embolism and right ventricular strain, cor pulmonale.  Plan: Due to patient's recent PE she needs to stay on anticoagulation for least 6 weeks before we consider any intervention. She has a intermediate probability lesion within the chest I think the upper lobe lesion is the most concerning in the lower lobe lesion is also potentially concerning for underlying indolent neoplasm.  In the setting of pulmonary embolism also makes this suspicious. We will have repeat noncontrasted CT scan imaging in March 2023. We will have her follow-up with me after that repeat CT is complete. We also discussed consideration for enrollment in nodule clinical trial.  Patient is agreeable for this and we will have our research coordinators reach out.   Current Outpatient Medications:    acetaminophen (TYLENOL) 325 MG tablet, Take 2 tablets (650 mg total) by mouth in the morning, at noon, in the evening, and at bedtime., Disp: 30 tablet, Rfl: 0   albuterol (VENTOLIN HFA) 108 (90 Base) MCG/ACT  inhaler, Inhale 2 puffs into the lungs every 6 (six) hours as needed for wheezing or shortness of breath., Disp: 8 g, Rfl: 1   APIXABAN (ELIQUIS) VTE STARTER PACK (10MG  AND 5MG ), Take as directed on package: start with two-5mg  tablets twice  daily for 7 days. On day 8, switch to one-5mg  tablet twice daily., Disp: 1 each, Rfl: 0   magnesium oxide (MAG-OX) 400 (240 Mg) MG tablet, Take 1 tablet (400 mg total) by mouth daily., Disp: , Rfl:    nicotine (NICODERM CQ - DOSED IN MG/24 HOURS) 14 mg/24hr patch, Place 1 patch (14 mg total) onto the skin daily., Disp: 28 patch, Rfl: 0   omeprazole (PRILOSEC) 20 MG capsule, Take 1 capsule (20 mg total) by mouth daily., Disp: 30 capsule, Rfl: 3   potassium chloride SA (KLOR-CON M) 20 MEQ tablet, Take 1 tablet (20 mEq total) by mouth daily., Disp: 90 tablet, Rfl: 3  I spent 62 minutes dedicated to the care of this patient on the date of this encounter to include pre-visit review of records, face-to-face time with the patient discussing conditions above, post visit ordering of testing, clinical documentation with the electronic health record, making appropriate referrals as documented, and communicating necessary findings to members of the patients care team.   Garner Nash, DO [facility] 03/12/2021 3:19 PM

## 2021-03-12 NOTE — Patient Instructions (Signed)
Thank you for visiting Dr. Valeta Harms at [facility]. Today we recommend the following:  You work on quitting smoking Planned CT chest in 2 months  Stay on blood thinner   Return in about 2 months (around 05/10/2021) for with Eric Form, NP, or Dr. Valeta Harms.    Please do your part to reduce the spread of COVID-19.    You must quit smoking or vaping. This is the single most important thing that you can do to improve your lung health.   S = Set a quit date. T = Tell family, friends, and the people around you that you plan to quit. A = Anticipate or plan ahead for the tough times you'll face while quitting. R = Remove cigarettes and other tobacco products from your home, car, and work T = Talk to [place] about getting help to quit  If you need help feel free to reach out to our office, [place] [facility]: [phone], call 1-800-QUIT-NOW, or visit [email or web address]

## 2021-03-15 ENCOUNTER — Inpatient Hospital Stay: Payer: BC Managed Care – PPO | Attending: Oncology | Admitting: Oncology

## 2021-03-15 ENCOUNTER — Other Ambulatory Visit: Payer: Self-pay

## 2021-03-15 ENCOUNTER — Inpatient Hospital Stay: Payer: BC Managed Care – PPO

## 2021-03-15 VITALS — BP 143/77 | HR 78 | Temp 97.8°F | Resp 20 | Ht 63.0 in | Wt 102.2 lb

## 2021-03-15 DIAGNOSIS — D7589 Other specified diseases of blood and blood-forming organs: Secondary | ICD-10-CM

## 2021-03-15 DIAGNOSIS — I82411 Acute embolism and thrombosis of right femoral vein: Secondary | ICD-10-CM | POA: Diagnosis not present

## 2021-03-15 DIAGNOSIS — I82431 Acute embolism and thrombosis of right popliteal vein: Secondary | ICD-10-CM | POA: Insufficient documentation

## 2021-03-15 DIAGNOSIS — Z79899 Other long term (current) drug therapy: Secondary | ICD-10-CM | POA: Insufficient documentation

## 2021-03-15 DIAGNOSIS — F17210 Nicotine dependence, cigarettes, uncomplicated: Secondary | ICD-10-CM

## 2021-03-15 DIAGNOSIS — D751 Secondary polycythemia: Secondary | ICD-10-CM | POA: Insufficient documentation

## 2021-03-15 DIAGNOSIS — I2699 Other pulmonary embolism without acute cor pulmonale: Secondary | ICD-10-CM | POA: Diagnosis not present

## 2021-03-15 DIAGNOSIS — Z801 Family history of malignant neoplasm of trachea, bronchus and lung: Secondary | ICD-10-CM | POA: Diagnosis not present

## 2021-03-15 DIAGNOSIS — Z806 Family history of leukemia: Secondary | ICD-10-CM

## 2021-03-15 LAB — CBC WITH DIFFERENTIAL (CANCER CENTER ONLY)
Abs Immature Granulocytes: 0.07 10*3/uL (ref 0.00–0.07)
Basophils Absolute: 0 10*3/uL (ref 0.0–0.1)
Basophils Relative: 1 %
Eosinophils Absolute: 0.2 10*3/uL (ref 0.0–0.5)
Eosinophils Relative: 3 %
HCT: 49.1 % — ABNORMAL HIGH (ref 36.0–46.0)
Hemoglobin: 16.6 g/dL — ABNORMAL HIGH (ref 12.0–15.0)
Immature Granulocytes: 1 %
Lymphocytes Relative: 31 %
Lymphs Abs: 2.4 10*3/uL (ref 0.7–4.0)
MCH: 36.5 pg — ABNORMAL HIGH (ref 26.0–34.0)
MCHC: 33.8 g/dL (ref 30.0–36.0)
MCV: 107.9 fL — ABNORMAL HIGH (ref 80.0–100.0)
Monocytes Absolute: 0.8 10*3/uL (ref 0.1–1.0)
Monocytes Relative: 10 %
Neutro Abs: 4.1 10*3/uL (ref 1.7–7.7)
Neutrophils Relative %: 54 %
Platelet Count: 240 10*3/uL (ref 150–400)
RBC: 4.55 MIL/uL (ref 3.87–5.11)
RDW: 14.7 % (ref 11.5–15.5)
WBC Count: 7.5 10*3/uL (ref 4.0–10.5)
nRBC: 0 % (ref 0.0–0.2)

## 2021-03-15 LAB — TSH: TSH: 1.147 u[IU]/mL (ref 0.350–4.500)

## 2021-03-15 LAB — RETIC PANEL
Immature Retic Fract: 4.2 % (ref 2.3–15.9)
RBC.: 4.55 MIL/uL (ref 3.87–5.11)
Retic Count, Absolute: 45 10*3/uL (ref 19.0–186.0)
Retic Ct Pct: 1 % (ref 0.4–3.1)
Reticulocyte Hemoglobin: 37.4 pg (ref >27.9)

## 2021-03-15 LAB — VITAMIN B12: Vitamin B-12: 217 pg/mL (ref 180–914)

## 2021-03-15 LAB — BILIRUBIN, FRACTIONATED(TOT/DIR/INDIR)
Bilirubin, Direct: 0.2 mg/dL (ref 0.0–0.2)
Indirect Bilirubin: 0.4 mg/dL (ref 0.3–0.9)
Total Bilirubin: 0.6 mg/dL (ref 0.3–1.2)

## 2021-03-15 NOTE — Progress Notes (Signed)
[facility] New Patient Consult   Requesting MD: [name], [address],  [place] [address]   Sue Chase 66 y.o.  06-20-1955    Reason for Consult: DVT/pulmonary embolism   HPI: Ms. Goris reports the onset of right leg swelling and pain approximately Thanksgiving of 2022.  She developed a "cold "during the holidays with a sore throat, cough, dyspnea, and right-sided chest pain.  She presented to the emergency room 02/18/2021.  A Doppler of the right lower extremity revealed acute DVT in the right femoral and popliteal veins.  A chest CT revealed bilateral lower lobe pulmonary emboli with evidence of right heart strain.  A right lower lobe groundglass opacity is felt to represent an infarct or infiltrate.  Scattered groundglass densities were noted in the right upper lung.  There is a nodule in the right lower lobe.  There is a mild T9 compression fracture.  She was admitted and placed on heparin anticoagulation.  Anticoagulation was transitioned to apixaban.  She continues apixaban anticoagulation.  She reports resolution of right leg swelling.  The dyspnea has improved.  She was referred to Dr. Harlow Ohms 03/12/2021.  There is concern for underlying neoplasm in the lung.  She is scheduled for a repeat CT and follow-up in March.  No recent surgery, prolonged travel, or immobility.  Past Medical History:  Diagnosis Date   Colon polyps    Hyperlipidemia    Rheumatic fever    Seasonal allergies    UTI (urinary tract infection)     .  Osteopenia   .  G3 P2, Ab1  Past Surgical History:  Procedure Laterality Date   SALIVARY GLAND SURGERY     LATE 80S   TUBAL LIGATION  1986    Medications: Reviewed  Allergies:  Allergies  Allergen Reactions   Codeine Itching    Family history: A distant cousin had a lower extremity DVT.  Her father had lung cancer and was a smoker.  Her paternal grandmother and grandfather had "leukemia ".  Social  History:   She lives alone in [place].  She is a recent widow.  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  She has smoked less than 1 pack of cigarettes per day for approximately 50 years.  She reports drinking alcohol 5 days/week.  She has received COVID 19 and influenza vaccines.  No transfusion history.  No risk factor for HIV or hepatitis.  ROS:   Positives include: Occasional nausea/vomiting after overeating, irregular bowel habits  A complete ROS was otherwise negative.  Physical Exam:  Blood pressure (!) 143/77, pulse 78, temperature 97.8 F (36.6 C), temperature source Oral, resp. rate 20, height 5\' 3"  (1.6 m), weight 102 lb 3.2 oz (46.4 kg), SpO2 98 %.  HEENT: Oropharynx without visible mass, neck without mass Lungs: Distant breath sounds, no respiratory distress Cardiac: Regular rate and rhythm Abdomen: No hepatosplenomegaly Breast: Bilateral breast without mass Vascular: Bilateral lower extremity varicosities, no edema or erythema Lymph nodes: No cervical, supraclavicular, axillary, or inguinal nodes Neurologic: Alert and oriented, the motor exam appears intact in the upper and lower extremities bilaterally Skin: No rash Musculoskeletal: No spine tenderness   LAB:  CBC  Lab Results  Component Value Date   WBC 8.1 03/11/2021   HGB 16.8 (H) 03/11/2021   HCT 50.7 (H) 03/11/2021   MCV 112.8 (H) 03/11/2021   PLT 319.0 03/11/2021   NEUTROABS 4.8 03/11/2021  CMP  Lab Results  Component Value Date   NA 138 03/11/2021   K 3.8 03/11/2021   CL 99 03/11/2021   CO2 30 03/11/2021   GLUCOSE 79 03/11/2021   BUN 10 03/11/2021   CREATININE 0.60 03/11/2021   CALCIUM 9.3 03/11/2021   PROT 6.1 (L) 02/20/2021   ALBUMIN 2.7 (L) 02/20/2021   AST 20 02/20/2021   ALT 10 02/20/2021   ALKPHOS 187 (H) 02/20/2021   BILITOT 1.8 (H) 02/20/2021   GFRNONAA >60 02/21/2021   GFRAA >60 09/23/2019     Imaging: CT images 02/18/2021 reviewed with Ms. Sabra Heck  and her daughter    Assessment/Plan:   Venous thromboembolic disease Right femoral and popliteal DVTs 02/18/2021 Bilateral pulmonary embolism with right heart strain on CT 02/18/2021 Admission 02/18/2021 for heparin anticoagulation followed by apixaban Groundglass right lung densities and right lower lobe nodule on CT 02/18/2021-referred to Dr. Valeta Harms 3.   Longstanding tobacco use 4.   T9 compression fracture on CT 02/18/2021 5.   History of colon polyps 6.   Erythrocytosis  7.   Red cell macrocytosis   Disposition:   Ms. Shelvin is referred for hematologic evaluation after being diagnosed with a recent DVT and bilateral pulmonary emboli.  She is maintained on apixaban anticoagulation.  I recommend continuing the current anticoagulation therapy.  She no immobility, surgery, or prolonged travel to predispose her to venous thromboembolic disease.  She has a long history of smoking and the 02/18/2021 chest CT is concerning for an underlying lung cancer.  She saw Dr. Valeta Harms and is scheduled for a follow-up chest CT an office visit in March.  I encouraged her to obtain a colonoscopy and mammogram.  She has Red cell macrocytosis.  This may be related to alcohol use.  We obtained a reticulocyte count, vitamin B12 level, fractionated bilirubin, and TSH today.  She has mild erythrocytosis, likely related to ongoing tobacco use and underlying pulmonary disease.  She will return for an office visit here after the appointment with Dr. Dossie Der, MD  03/15/2021, 1:56 PM

## 2021-03-15 NOTE — Research (Signed)
Title: NIGHTINGALE: CliNIcal Utility of ManaGement of Patients witH CT and LDCT Identified Pulmonary Nodules UsinG the Percepta NasAL Swab ClassifiEr -- with Familiarization   Protocol #: [id number] Sponsor: [employer]   Protocol Revision 1 dated 01Sep2022 and confirmed current on today's visit, IRB approved Revision 1 on 29Dec2022.   Objectives:  Primary: To evaluate if use of the Percepta Nasal Swab test in the diagnostic work up of newly identified pulmonary nodules reduces the number of invasive procedures in the group classified as low-risk by the test and that are benign as compared to a control group managed without a Percepta Nasal Swab test result.                   A newly identified nodule is defined as any nodule first identified on imaging                   <90 days prior to nasal sample collection that hasnt undergone a diagnostic                    procedure for the management of their index nodule prior to enrollment.                   CT imaging includes conventional CT, LDCT, HRCT                   Benign diagnosis is defined as a specific diagnosis of a benign condition,                    radiographic resolution or stability at ? 24 months, or no cytological,                    radiological, or pathological evidence of cancer.                   Procedures will be categorized as either invasive or non-invasive in the Data                    Management Plan (DMP). Secondary: To evaluate if use of the Percepta Nasal Swab test in the diagnostic work up of newly identified pulmonary nodules increases the proportion of subjects classified as high-risk by the test and have primary lung cancer that go directly to appropriate therapy as compared to a control group managed without a Percepta Nasal Swab test result.                    Proportion of subjects that go directly to appropriate therapy is defined as those                     subjects that undergo surgery, ablative  or other appropriate therapy as the next                     step after the Percepta Nasal Swab test result without intervening non-surgical                     procedures                             a. Non-surgical procedures include diagnostic PET, but not PET for  staging purposes.                             b. Appropriate therapies will be defined in the CRF.                    A newly identified nodule is defined as any nodule first identified on imaging                    <90 days prior to nasal sample collection.                    Lung cancer diagnosis is defined as established by cytology or pathology, or in                     circumstances where a presumptive diagnosis of cancer led to definitive                     ablative or other appropriate therapy without pathology. Key Inclusion Criteria:  Inclusion Criteria:  Able to tolerate nasal epithelial specimen collection  Signed written Informed Consent obtained  Subject clinical history available for review by sponsor and regulatory agencies  New nodule first identified on imaging < 90 days prior to nasal sample collection (index nodule)  CT report available for index nodule  9 - 67 years of age  Current or former smoker (>100 cigarettes in a lifetime)  Pulmonary nodule ?30 mm detected by CT  Key Exclusion Criteria: Exclusion Criteria  Subject has undergone a diagnostic procedure for the management of their index nodule after the index CT and prior to enrollment  Active cancer (other than non-melanoma skin cancer)  Prior primary lung cancer (prior non-lung cancer acceptable)  Prior participation in this study (i.e., subjects may not be enrolled more than once)  Current active treatment with an investigational device or drug (patients in trial follow up period are okay if intervention phase is complete)  Patient enrolled or planned to be enrolled in another clinical trial that may influence  management of the patients nodule  Concurrent or planned use of tools or tests for assigning lung nodule risk of malignancy (e.g., genomic or proteomic blood tests) other than clinically validated risk calculators  Clinical Research Coordinator / Research RN note : This visit is for Enrollment / baseline Subject 25-0010 with DOB: [id number] on [id number] for the above protocol is an Enrollment Visit and is for purpose of research.    Subject expressed interest and consent in continuing as a study subject. Subject confirmed contact information (e.g. address, telephone, email). Subject thanked for participation in research and contribution to science.     During this visit on 20JAN2023  , the subject reviewed and signed the consent form, provided demographics, and had a nasal swab collected per the above referenced protocol. Patient tolerated well. Please refer to the subject's paper source binder for further details.   PI, Dr. Valeta Harms, was present for consenting process.          Signed by Jaye Beagle RN, MSN/MBA, CRN II

## 2021-03-16 ENCOUNTER — Telehealth: Payer: Self-pay

## 2021-03-16 NOTE — Telephone Encounter (Signed)
Pt verbalized understanding.

## 2021-03-16 NOTE — Telephone Encounter (Signed)
-----   Message from Ladell Pier, MD sent at 03/15/2021  8:12 PM EST ----- Please call patient, b12 and thyroid are nl, elevated mcv likely due to alcohol use, f/u as scheduled

## 2021-04-09 LAB — ACID FAST CULTURE WITH REFLEXED SENSITIVITIES (MYCOBACTERIA): Acid Fast Culture: NEGATIVE

## 2021-04-30 ENCOUNTER — Telehealth: Payer: Self-pay | Admitting: Adult Health

## 2021-04-30 MED ORDER — APIXABAN 5 MG PO TABS: 5.0000 mg | ORAL_TABLET | Freq: Two times a day (BID) | ORAL | 1 refills | Status: DC

## 2021-04-30 NOTE — Telephone Encounter (Signed)
Please advise 

## 2021-04-30 NOTE — Telephone Encounter (Signed)
Pt is calling and per pt was told to call when she needs more APIXABAN (ELIQUIS 5 mg . Pt seen cory in jan 2023. This will be new rx from cory ?[id number] - [place], [place] Phone:  [phone]  ?Fax:  [phone]  ?  ? ?

## 2021-05-03 ENCOUNTER — Ambulatory Visit (HOSPITAL_COMMUNITY): Payer: BC Managed Care – PPO

## 2021-05-10 ENCOUNTER — Other Ambulatory Visit: Payer: Self-pay

## 2021-05-10 ENCOUNTER — Ambulatory Visit (HOSPITAL_COMMUNITY)
Admission: RE | Admit: 2021-05-10 | Discharge: 2021-05-10 | Disposition: A | Discharge status: Home / Self Care | Payer: BC Managed Care – PPO | Source: Ambulatory Visit | Attending: Pulmonary Disease | Admitting: Pulmonary Disease

## 2021-05-10 DIAGNOSIS — J181 Lobar pneumonia, unspecified organism: Secondary | ICD-10-CM | POA: Diagnosis not present

## 2021-05-10 DIAGNOSIS — R911 Solitary pulmonary nodule: Secondary | ICD-10-CM | POA: Insufficient documentation

## 2021-05-10 IMAGING — CT CT CHEST SUPER D W/O CM
2 of 4 series · 15 of 36 positions shown, 18 images · non-contrast
Comparison: CT chest dated [DATE]

CLINICAL DATA: lung nodule

EXAM:
CT CHEST WITHOUT CONTRAST
TECHNIQUE: Multidetector CT imaging of the chest was performed using thin slice
collimation for electromagnetic bronchoscopy planning purposes,
without intravenous contrast.
RADIATION DOSE REDUCTION: This exam was performed according to the
departmental dose-optimization program which includes automated
exposure control, adjustment of the mA and/or kV according to
patient size and/or use of iterative reconstruction technique.

[Series 5: lungs · axial · 0.58mm/px · z∈[-229,+43]mm · 12 of 153 slices shown, 15 images]
[im 9/153  mediastinal]
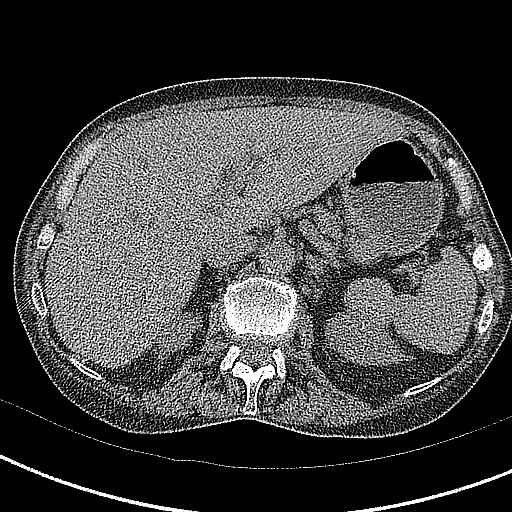
[im 9/153  lung]
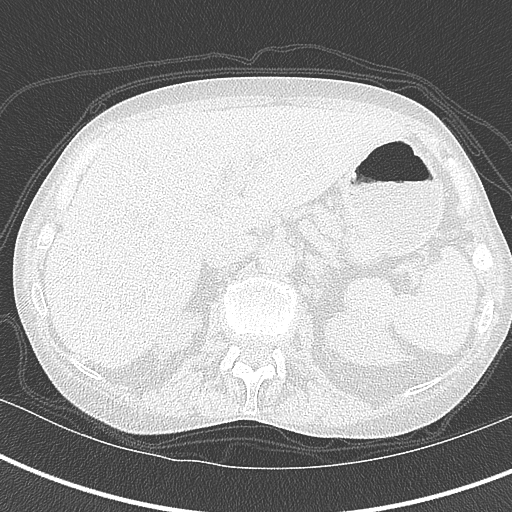
[im 25/153  lung]
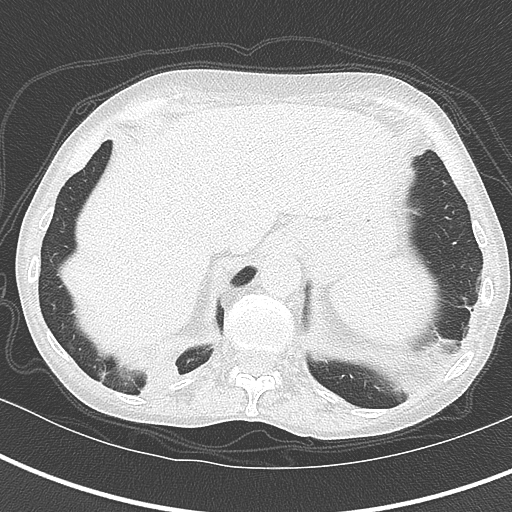
[im 33/153  lung]
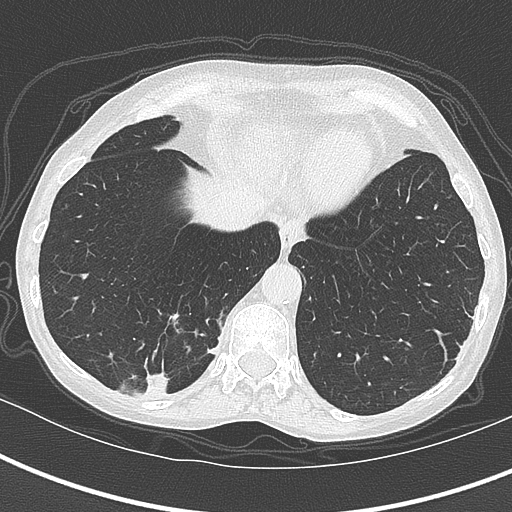
[im 49/153  lung]
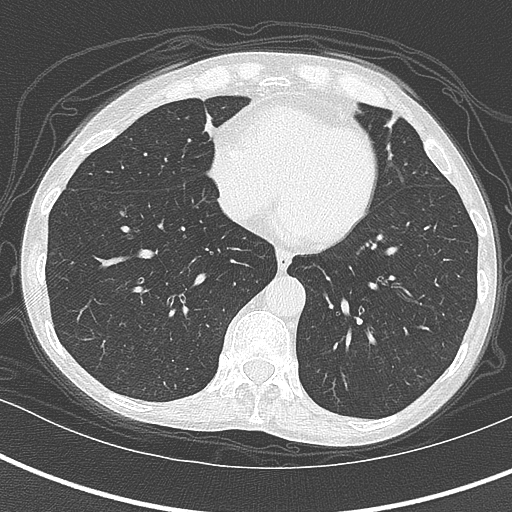
[im 57/153  mediastinal]
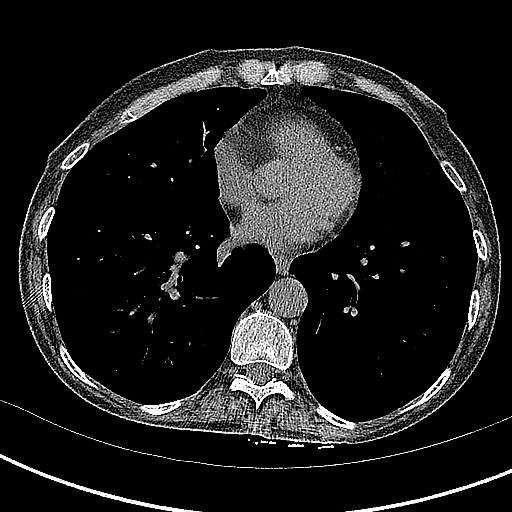
[im 57/153  lung]
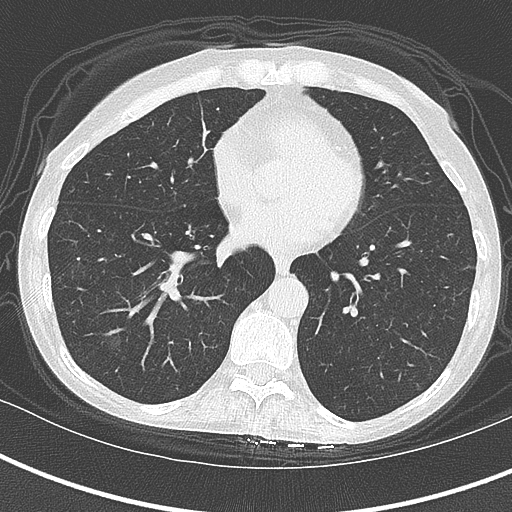
[im 73/153  lung]
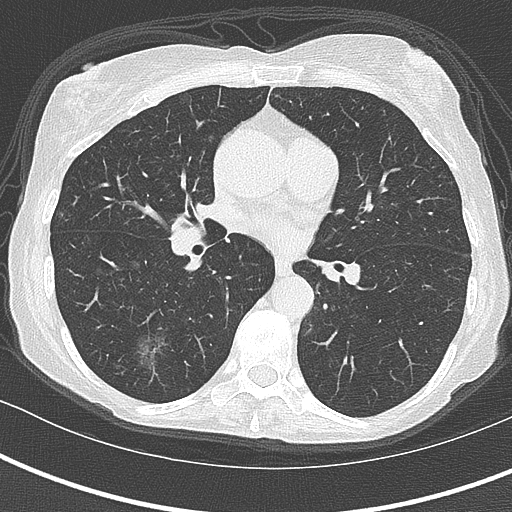
[im 81/153  lung]
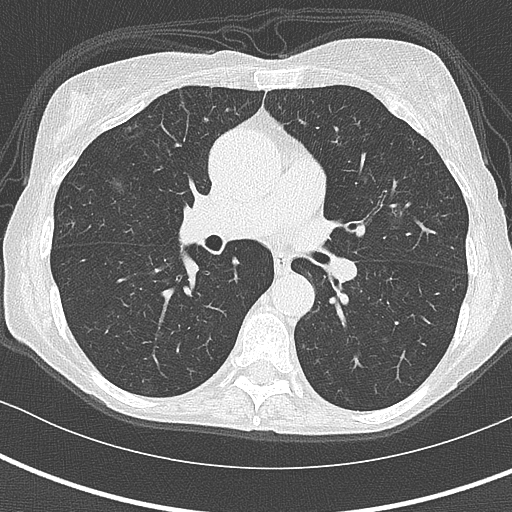
[im 97/153  lung]
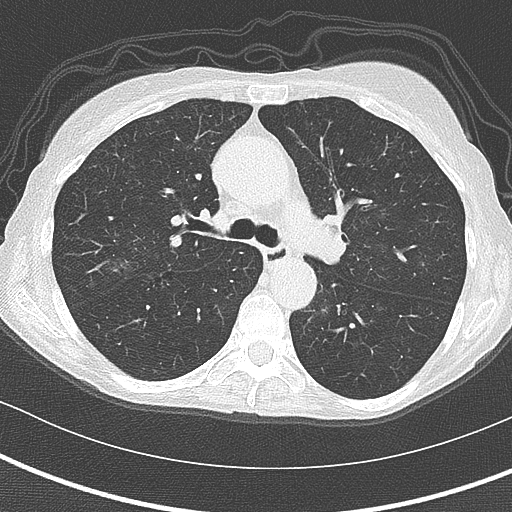
[im 105/153  mediastinal]
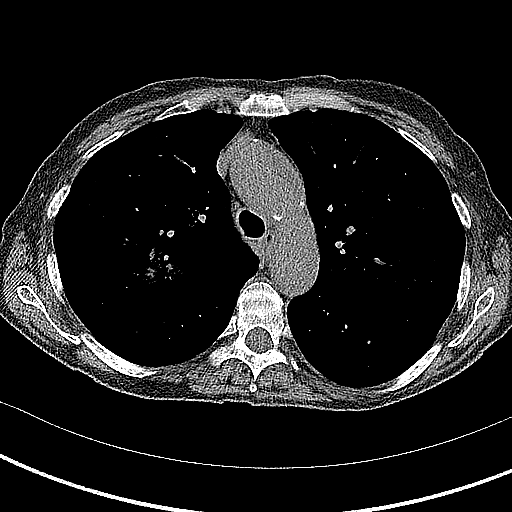
[im 105/153  lung]
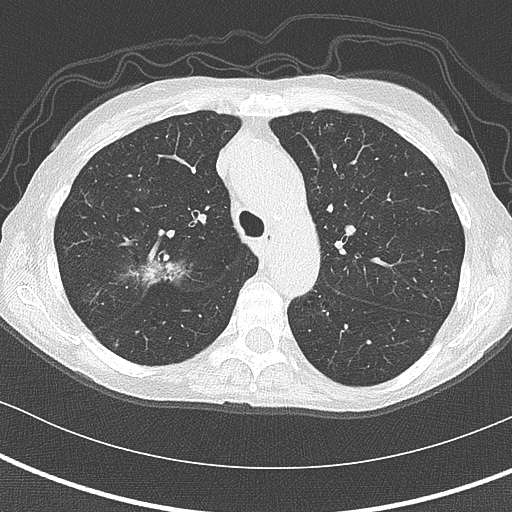
[im 121/153  lung]
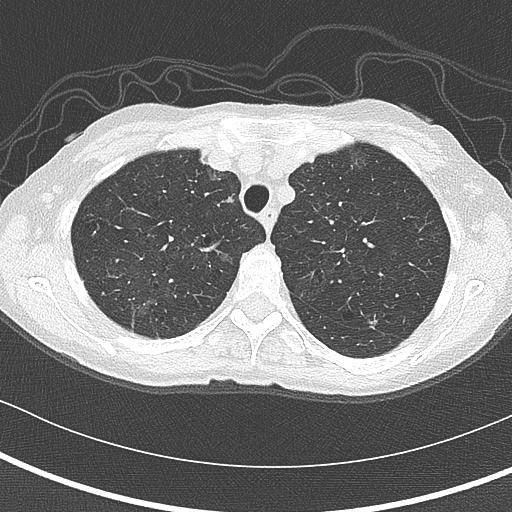
[im 129/153  lung]
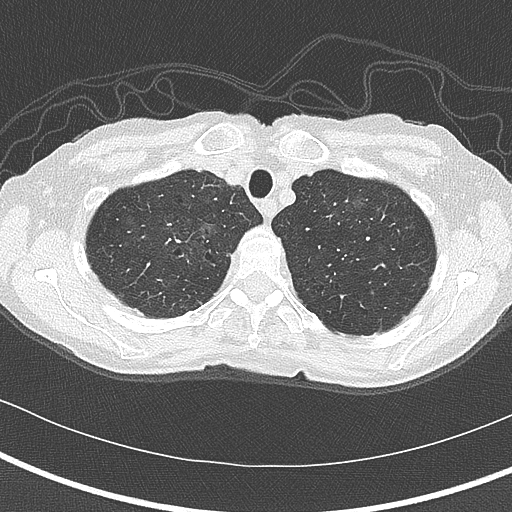
[im 145/153  lung]
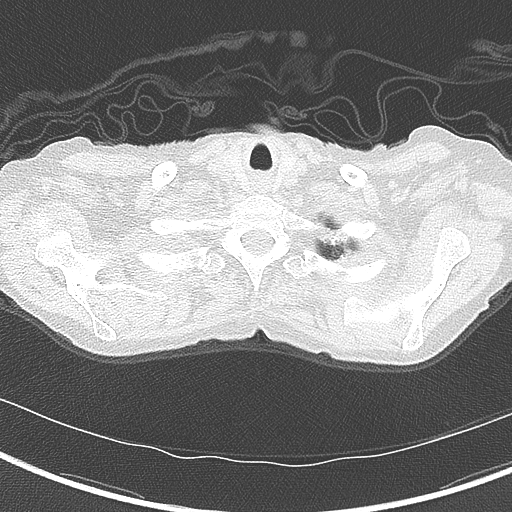

[Series 6: coronal · coronal · 0.60mm/px · 3 of 128 slices shown]
[im 26/128  lung]
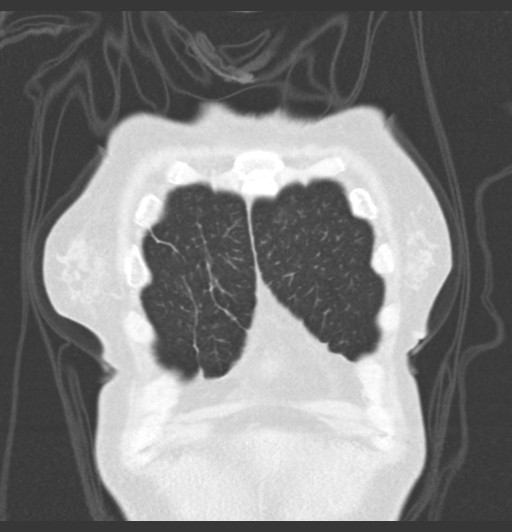
[im 51/128  lung]
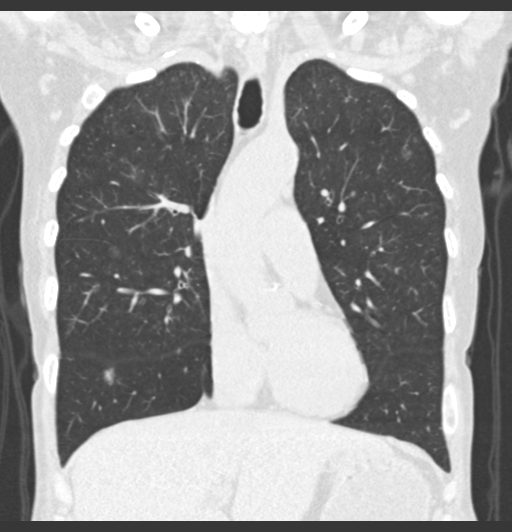
[im 77/128  lung]
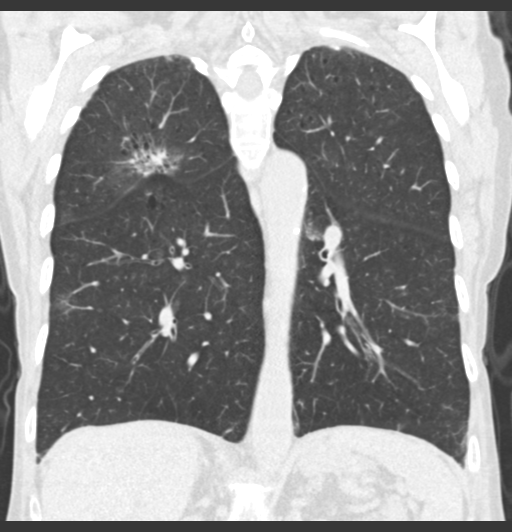

[15 of 36 positions shown; findings below may reference images not displayed]

FINDINGS: Cardiovascular: Normal heart size. No pericardial effusion. Coronary
artery calcifications of the LAD. Atherosclerotic disease of the
thoracic aorta. Dilated ascending thoracic aorta, measuring up to
4.2 cm, unchanged when compared with prior exam.

Mediastinum/Nodes: Esophagus and thyroid are unremarkable. No
pathologically enlarged lymph nodes seen in the chest.

Lungs/Pleura: Central airways are patent. Part solid nodule of the
right upper lobe with retraction of the stationed fissure measures
up to 3.7 x 2.6 cm in overall diameter, unchanged when compared with
prior exam and remeasured in similar plane. Associated solid
component measuring up to 11 mm on sagittal series 7, image 45,
similar to prior however differences in exam technique somewhat
limits evaluation. Additional bilateral ground-glass nodules are
seen which are unchanged in size when compared to prior exam.
Reference right lower lobe ground-glass nodule measuring 2.0 x
cm on series 5, image 80, unchanged size when compared to prior
exam. Decreased right lower lobe consolidation with residual linear
opacities, likely resolving pulmonary infarct. Additional mild
linear opacities of the left lower lobe are unchanged compared to
prior and likely scarring. Stable small bilateral solid pulmonary
nodules. Reference solid nodule of the left upper lobe measuring 3
mm on series 5, image 32.

Upper Abdomen: No acute abnormality.

Musculoskeletal: Stable mild compression deformity of T9. No
aggressive appearing osseous lesions.
IMPRESSION: 1. Stable part solid nodule of the right upper lobe. Additional
bilateral ground-glass nodules are also stable. Findings are
concerning for multifocal adenocarcinoma.
2. Decreased right lower lobe consolidation with residual linear
opacities, likely resolving pulmonary infarct.
3. Aortic Atherosclerosis ([4V]-[4V]) and Emphysema ([4V]-[4V]).

## 2021-05-17 ENCOUNTER — Encounter: Payer: Self-pay | Admitting: Pulmonary Disease

## 2021-05-17 ENCOUNTER — Other Ambulatory Visit: Payer: Self-pay

## 2021-05-17 ENCOUNTER — Ambulatory Visit (INDEPENDENT_AMBULATORY_CARE_PROVIDER_SITE_OTHER): Payer: BC Managed Care – PPO | Admitting: Pulmonary Disease

## 2021-05-17 VITALS — BP 128/78 | HR 71 | Temp 97.8°F | Ht 63.0 in | Wt 100.8 lb

## 2021-05-17 DIAGNOSIS — R918 Other nonspecific abnormal finding of lung field: Secondary | ICD-10-CM | POA: Diagnosis not present

## 2021-05-17 NOTE — Patient Instructions (Signed)
Thank you for visiting Dr. Valeta Harms at [facility]. ?Today we recommend the following: ? ?Orders Placed This Encounter  ?Procedures  ? Procedural/ Surgical Case Request: ROBOTIC ASSISTED NAVIGATIONAL BRONCHOSCOPY  ? Ambulatory referral to Pulmonology  ? ?Bronchoscopy date on 06/08/2021, Tuesday  ?Last dose eliquis on 06/05/2021, Saturday before ? ?Return in about 29 days (around 06/15/2021) for with Eric Form, NP. ? ? ? ?Please do your part to reduce the spread of COVID-19.  ? ?

## 2021-05-17 NOTE — H&P (View-Only) (Signed)
? ?Synopsis: Referred in Jan 2023 for lung nodules by [name], NP ? ?Subjective:  ? ?PATIENT ID: Sue Chase GENDER: female DOB: Jun 13, 1955, MRN: [id number] ? ?Chief Complaint  ?Patient presents with  ? Follow-up  ?  Follow up. Patient has no complaints.   ? ? ?This is a 66 year old female, past medical history of hyperlipidemia, seasonal allergies.  Longstanding history of smoking since a teenager.  Patient this past fall her husband was diagnosed pancreatic cancer she had been going in and out of the hospital.  Unfortunately he passed from his disease she developed swelling and a lower extremity she continued to walk on it and had continued swelling in the right leg.  Ultimately she came to the ER had ultrasound which confirmed a DVT.  CT scan of the chest revealed submassive PE.  She was placed on anticoagulation at discharge.  CT scan of the chest at the time also revealed multiple areas of pulmonary nodules.  She had the spiculated larger lesion in the right upper lobe adjacent to the major fissure that also had some puckering and pulling of the major fissure line.  She was referred here for evaluation of potential concern for underlying malignancy. ? ?OV 05/17/2021: This is a 66 year old female here today for follow-up after recent CT scan of the chest.'s recent CT this cast reveals multiple areas of subsolid lung lesions.  Concerning for multifocal adenocarcinoma.  We reviewed the CT today in the office.  She is a little bit anxious and worried about everything that is going on. ? ? ?Past Medical History:  ?Diagnosis Date  ? Colon polyps   ? Hyperlipidemia   ? Rheumatic fever   ? Seasonal allergies   ? UTI (urinary tract infection)   ?  ? ?Family History  ?Problem Relation Age of Onset  ? Arthritis Mother   ? Diabetes Mother   ? Heart disease Mother   ? Hyperlipidemia Mother   ? Hypertension Mother   ? Miscarriages / [place] Mother   ? Hearing loss Father   ? Liver cancer Father   ? Lung  cancer Father   ? Hyperlipidemia Father   ? Diabetes Brother   ? Lung disease Maternal Grandfather   ?     Black Lung  ? Diabetes Paternal Grandmother   ? Cancer Paternal Grandfather   ? Diabetes Paternal Grandfather   ?  ? ?Past Surgical History:  ?Procedure Laterality Date  ? SALIVARY GLAND SURGERY    ? LATE 80S  ? TUBAL LIGATION  1986  ? ? ?Social History  ? ?Socioeconomic History  ? Marital status: Widowed  ?  Spouse name: Not on file  ? Number of children: Not on file  ? Years of education: Not on file  ? Highest education level: Not on file  ?Occupational History  ? Not on file  ?Tobacco Use  ? Smoking status: Every Day  ?  Packs/day: 1.00  ?  Years: 40.00  ?  Pack years: 40.00  ?  Types: Cigarettes  ? Smokeless tobacco: Never  ?Vaping Use  ? Vaping Use: Never used  ?Substance and Sexual Activity  ? Alcohol use: Yes  ?  Comment: Rum and Coke/Unsure of amount  ? Drug use: Never  ? Sexual activity: Not on file  ?Other Topics Concern  ? Not on file  ?Social History Narrative  ? Married   ? Two grown children   ? She enjoys reading, walking  ? ?Social Determinants of Health  ? ?  Financial Resource Strain: Not on file  ?Food Insecurity: Not on file  ?Transportation Needs: Not on file  ?Physical Activity: Not on file  ?Stress: Not on file  ?Social Connections: Not on file  ?Intimate Partner Violence: Not on file  ?  ? ?Allergies  ?Allergen Reactions  ? Codeine Itching  ?  ? ?Outpatient Medications Prior to Visit  ?Medication Sig Dispense Refill  ? acetaminophen (TYLENOL) 325 MG tablet Take 2 tablets (650 mg total) by mouth in the morning, at noon, in the evening, and at bedtime. 30 tablet 0  ? albuterol (VENTOLIN HFA) 108 (90 Base) MCG/ACT inhaler Inhale 2 puffs into the lungs every 6 (six) hours as needed for wheezing or shortness of breath. 8 g 1  ? apixaban (ELIQUIS) 5 MG TABS tablet Take 1 tablet (5 mg total) by mouth 2 (two) times daily. 180 tablet 1  ? potassium chloride SA (KLOR-CON M) 20 MEQ tablet Take 1  tablet (20 mEq total) by mouth daily. 90 tablet 3  ? nicotine (NICODERM CQ - DOSED IN MG/24 HOURS) 14 mg/24hr patch Place 1 patch (14 mg total) onto the skin daily. (Patient not taking: Reported on 03/15/2021) 28 patch 0  ? omeprazole (PRILOSEC) 20 MG capsule Take 1 capsule (20 mg total) by mouth daily. (Patient not taking: Reported on 03/15/2021) 30 capsule 3  ? ?No facility-administered medications prior to visit.  ? ? ?Review of Systems  ?Constitutional:  Negative for chills, fever, malaise/fatigue and weight loss.  ?HENT:  Negative for hearing loss, sore throat and tinnitus.   ?Eyes:  Negative for blurred vision and double vision.  ?Respiratory:  Positive for shortness of breath. Negative for cough, hemoptysis, sputum production, wheezing and stridor.   ?Cardiovascular:  Negative for chest pain, palpitations, orthopnea, leg swelling and PND.  ?Gastrointestinal:  Negative for abdominal pain, constipation, diarrhea, heartburn, nausea and vomiting.  ?Genitourinary:  Negative for dysuria, hematuria and urgency.  ?Musculoskeletal:  Negative for joint pain and myalgias.  ?Skin:  Negative for itching and rash.  ?Neurological:  Negative for dizziness, tingling, weakness and headaches.  ?Endo/Heme/Allergies:  Negative for environmental allergies. Does not bruise/bleed easily.  ?Psychiatric/Behavioral:  Negative for depression. The patient is not nervous/anxious and does not have insomnia.   ?All other systems reviewed and are negative. ? ? ?Objective:  ?Physical Exam ?Vitals reviewed.  ?Constitutional:   ?   General: She is not in acute distress. ?   Appearance: She is well-developed.  ?   Comments: Elderly thin female  ?HENT:  ?   Head: Normocephalic and atraumatic.  ?Eyes:  ?   General: No scleral icterus. ?   Conjunctiva/sclera: Conjunctivae normal.  ?   Pupils: Pupils are equal, round, and reactive to light.  ?Neck:  ?   Vascular: No JVD.  ?   Trachea: No tracheal deviation.  ?Cardiovascular:  ?   Rate and Rhythm:  Normal rate and regular rhythm.  ?   Heart sounds: Normal heart sounds. No murmur heard. ?Pulmonary:  ?   Effort: Pulmonary effort is normal. No tachypnea, accessory muscle usage or respiratory distress.  ?   Breath sounds: No stridor. No wheezing, rhonchi or rales.  ?Abdominal:  ?   General: There is no distension.  ?   Palpations: Abdomen is soft.  ?   Tenderness: There is no abdominal tenderness.  ?Musculoskeletal:     ?   General: No tenderness.  ?   Cervical back: Neck supple.  ?Lymphadenopathy:  ?  Cervical: No cervical adenopathy.  ?Skin: ?   General: Skin is warm and dry.  ?   Capillary Refill: Capillary refill takes less than 2 seconds.  ?   Findings: No rash.  ?Neurological:  ?   Mental Status: She is alert and oriented to person, place, and time.  ?Psychiatric:     ?   Behavior: Behavior normal.  ? ? ? ?Vitals:  ? 05/17/21 1106  ?BP: 128/78  ?Pulse: 71  ?Temp: 97.8 ?F (36.6 ?C)  ?TempSrc: Oral  ?SpO2: 98%  ?Weight: 100 lb 12.8 oz (45.7 kg)  ?Height: 5\' 3"  (1.6 m)  ? ?98% on RA ?BMI Readings from Last 3 Encounters:  ?05/17/21 17.86 kg/m?  ?03/15/21 18.10 kg/m?  ?03/12/21 18.25 kg/m?  ? ?Wt Readings from Last 3 Encounters:  ?05/17/21 100 lb 12.8 oz (45.7 kg)  ?03/15/21 102 lb 3.2 oz (46.4 kg)  ?03/12/21 103 lb (46.7 kg)  ? ? ? ?CBC ?   ?Component Value Date/Time  ? WBC 7.5 03/15/2021 1448  ? WBC 8.1 03/11/2021 1052  ? RBC 4.55 03/15/2021 1448  ? RBC 4.55 03/15/2021 1447  ? HGB 16.6 (H) 03/15/2021 1448  ? HCT 49.1 (H) 03/15/2021 1448  ? PLT 240 03/15/2021 1448  ? MCV 107.9 (H) 03/15/2021 1448  ? MCH 36.5 (H) 03/15/2021 1448  ? MCHC 33.8 03/15/2021 1448  ? RDW 14.7 03/15/2021 1448  ? LYMPHSABS 2.4 03/15/2021 1448  ? MONOABS 0.8 03/15/2021 1448  ? EOSABS 0.2 03/15/2021 1448  ? BASOSABS 0.0 03/15/2021 1448  ? ? ? ?Chest Imaging: ?02/18/2021: CT chest ?Bilateral lower lobe pulmonary emboli with right ventricular strain. ?Groundglass areas throughout the lung lower lobe concerning for infiltrate such as  pneumonia.  Less likely to be infarct to me. ?Scattered nodules throughout the lungs she has a larger irregular spiculated groundglass subsolid nodule that is approximately 1.7 cm in the right upper lobe. ?Additionally

## 2021-05-17 NOTE — Progress Notes (Signed)
? ?Synopsis: Referred in Jan 2023 for lung nodules by [name], NP ? ?Subjective:  ? ?PATIENT ID: Sue Chase GENDER: female DOB: June 06, 1955, MRN: [id number] ? ?Chief Complaint  ?Patient presents with  ? Follow-up  ?  Follow up. Patient has no complaints.   ? ? ?This is a 66 year old female, past medical history of hyperlipidemia, seasonal allergies.  Longstanding history of smoking since a teenager.  Patient this past fall her husband was diagnosed pancreatic cancer she had been going in and out of the hospital.  Unfortunately he passed from his disease she developed swelling and a lower extremity she continued to walk on it and had continued swelling in the right leg.  Ultimately she came to the ER had ultrasound which confirmed a DVT.  CT scan of the chest revealed submassive PE.  She was placed on anticoagulation at discharge.  CT scan of the chest at the time also revealed multiple areas of pulmonary nodules.  She had the spiculated larger lesion in the right upper lobe adjacent to the major fissure that also had some puckering and pulling of the major fissure line.  She was referred here for evaluation of potential concern for underlying malignancy. ? ?OV 05/17/2021: This is a 66 year old female here today for follow-up after recent CT scan of the chest.'s recent CT this cast reveals multiple areas of subsolid lung lesions.  Concerning for multifocal adenocarcinoma.  We reviewed the CT today in the office.  She is a little bit anxious and worried about everything that is going on. ? ? ?Past Medical History:  ?Diagnosis Date  ? Colon polyps   ? Hyperlipidemia   ? Rheumatic fever   ? Seasonal allergies   ? UTI (urinary tract infection)   ?  ? ?Family History  ?Problem Relation Age of Onset  ? Arthritis Mother   ? Diabetes Mother   ? Heart disease Mother   ? Hyperlipidemia Mother   ? Hypertension Mother   ? Miscarriages / [place] Mother   ? Hearing loss Father   ? Liver cancer Father   ? Lung  cancer Father   ? Hyperlipidemia Father   ? Diabetes Brother   ? Lung disease Maternal Grandfather   ?     Black Lung  ? Diabetes Paternal Grandmother   ? Cancer Paternal Grandfather   ? Diabetes Paternal Grandfather   ?  ? ?Past Surgical History:  ?Procedure Laterality Date  ? SALIVARY GLAND SURGERY    ? LATE 80S  ? TUBAL LIGATION  1986  ? ? ?Social History  ? ?Socioeconomic History  ? Marital status: Widowed  ?  Spouse name: Not on file  ? Number of children: Not on file  ? Years of education: Not on file  ? Highest education level: Not on file  ?Occupational History  ? Not on file  ?Tobacco Use  ? Smoking status: Every Day  ?  Packs/day: 1.00  ?  Years: 40.00  ?  Pack years: 40.00  ?  Types: Cigarettes  ? Smokeless tobacco: Never  ?Vaping Use  ? Vaping Use: Never used  ?Substance and Sexual Activity  ? Alcohol use: Yes  ?  Comment: Rum and Coke/Unsure of amount  ? Drug use: Never  ? Sexual activity: Not on file  ?Other Topics Concern  ? Not on file  ?Social History Narrative  ? Married   ? Two grown children   ? She enjoys reading, walking  ? ?Social Determinants of Health  ? ?  Financial Resource Strain: Not on file  ?Food Insecurity: Not on file  ?Transportation Needs: Not on file  ?Physical Activity: Not on file  ?Stress: Not on file  ?Social Connections: Not on file  ?Intimate Partner Violence: Not on file  ?  ? ?Allergies  ?Allergen Reactions  ? Codeine Itching  ?  ? ?Outpatient Medications Prior to Visit  ?Medication Sig Dispense Refill  ? acetaminophen (TYLENOL) 325 MG tablet Take 2 tablets (650 mg total) by mouth in the morning, at noon, in the evening, and at bedtime. 30 tablet 0  ? albuterol (VENTOLIN HFA) 108 (90 Base) MCG/ACT inhaler Inhale 2 puffs into the lungs every 6 (six) hours as needed for wheezing or shortness of breath. 8 g 1  ? apixaban (ELIQUIS) 5 MG TABS tablet Take 1 tablet (5 mg total) by mouth 2 (two) times daily. 180 tablet 1  ? potassium chloride SA (KLOR-CON M) 20 MEQ tablet Take 1  tablet (20 mEq total) by mouth daily. 90 tablet 3  ? nicotine (NICODERM CQ - DOSED IN MG/24 HOURS) 14 mg/24hr patch Place 1 patch (14 mg total) onto the skin daily. (Patient not taking: Reported on 03/15/2021) 28 patch 0  ? omeprazole (PRILOSEC) 20 MG capsule Take 1 capsule (20 mg total) by mouth daily. (Patient not taking: Reported on 03/15/2021) 30 capsule 3  ? ?No facility-administered medications prior to visit.  ? ? ?Review of Systems  ?Constitutional:  Negative for chills, fever, malaise/fatigue and weight loss.  ?HENT:  Negative for hearing loss, sore throat and tinnitus.   ?Eyes:  Negative for blurred vision and double vision.  ?Respiratory:  Positive for shortness of breath. Negative for cough, hemoptysis, sputum production, wheezing and stridor.   ?Cardiovascular:  Negative for chest pain, palpitations, orthopnea, leg swelling and PND.  ?Gastrointestinal:  Negative for abdominal pain, constipation, diarrhea, heartburn, nausea and vomiting.  ?Genitourinary:  Negative for dysuria, hematuria and urgency.  ?Musculoskeletal:  Negative for joint pain and myalgias.  ?Skin:  Negative for itching and rash.  ?Neurological:  Negative for dizziness, tingling, weakness and headaches.  ?Endo/Heme/Allergies:  Negative for environmental allergies. Does not bruise/bleed easily.  ?Psychiatric/Behavioral:  Negative for depression. The patient is not nervous/anxious and does not have insomnia.   ?All other systems reviewed and are negative. ? ? ?Objective:  ?Physical Exam ?Vitals reviewed.  ?Constitutional:   ?   General: She is not in acute distress. ?   Appearance: She is well-developed.  ?   Comments: Elderly thin female  ?HENT:  ?   Head: Normocephalic and atraumatic.  ?Eyes:  ?   General: No scleral icterus. ?   Conjunctiva/sclera: Conjunctivae normal.  ?   Pupils: Pupils are equal, round, and reactive to light.  ?Neck:  ?   Vascular: No JVD.  ?   Trachea: No tracheal deviation.  ?Cardiovascular:  ?   Rate and Rhythm:  Normal rate and regular rhythm.  ?   Heart sounds: Normal heart sounds. No murmur heard. ?Pulmonary:  ?   Effort: Pulmonary effort is normal. No tachypnea, accessory muscle usage or respiratory distress.  ?   Breath sounds: No stridor. No wheezing, rhonchi or rales.  ?Abdominal:  ?   General: There is no distension.  ?   Palpations: Abdomen is soft.  ?   Tenderness: There is no abdominal tenderness.  ?Musculoskeletal:     ?   General: No tenderness.  ?   Cervical back: Neck supple.  ?Lymphadenopathy:  ?  Cervical: No cervical adenopathy.  ?Skin: ?   General: Skin is warm and dry.  ?   Capillary Refill: Capillary refill takes less than 2 seconds.  ?   Findings: No rash.  ?Neurological:  ?   Mental Status: She is alert and oriented to person, place, and time.  ?Psychiatric:     ?   Behavior: Behavior normal.  ? ? ? ?Vitals:  ? 05/17/21 1106  ?BP: 128/78  ?Pulse: 71  ?Temp: 97.8 ?F (36.6 ?C)  ?TempSrc: Oral  ?SpO2: 98%  ?Weight: 100 lb 12.8 oz (45.7 kg)  ?Height: 5\' 3"  (1.6 m)  ? ?98% on RA ?BMI Readings from Last 3 Encounters:  ?05/17/21 17.86 kg/m?  ?03/15/21 18.10 kg/m?  ?03/12/21 18.25 kg/m?  ? ?Wt Readings from Last 3 Encounters:  ?05/17/21 100 lb 12.8 oz (45.7 kg)  ?03/15/21 102 lb 3.2 oz (46.4 kg)  ?03/12/21 103 lb (46.7 kg)  ? ? ? ?CBC ?   ?Component Value Date/Time  ? WBC 7.5 03/15/2021 1448  ? WBC 8.1 03/11/2021 1052  ? RBC 4.55 03/15/2021 1448  ? RBC 4.55 03/15/2021 1447  ? HGB 16.6 (H) 03/15/2021 1448  ? HCT 49.1 (H) 03/15/2021 1448  ? PLT 240 03/15/2021 1448  ? MCV 107.9 (H) 03/15/2021 1448  ? MCH 36.5 (H) 03/15/2021 1448  ? MCHC 33.8 03/15/2021 1448  ? RDW 14.7 03/15/2021 1448  ? LYMPHSABS 2.4 03/15/2021 1448  ? MONOABS 0.8 03/15/2021 1448  ? EOSABS 0.2 03/15/2021 1448  ? BASOSABS 0.0 03/15/2021 1448  ? ? ? ?Chest Imaging: ?02/18/2021: CT chest ?Bilateral lower lobe pulmonary emboli with right ventricular strain. ?Groundglass areas throughout the lung lower lobe concerning for infiltrate such as  pneumonia.  Less likely to be infarct to me. ?Scattered nodules throughout the lungs she has a larger irregular spiculated groundglass subsolid nodule that is approximately 1.7 cm in the right upper lobe. ?Additionally

## 2021-05-18 ENCOUNTER — Encounter: Payer: Self-pay | Admitting: Pulmonary Disease

## 2021-05-20 ENCOUNTER — Inpatient Hospital Stay: Payer: BC Managed Care – PPO | Attending: Oncology | Admitting: Oncology

## 2021-05-20 ENCOUNTER — Other Ambulatory Visit: Payer: BC Managed Care – PPO

## 2021-05-20 VITALS — BP 150/83 | HR 73 | Temp 98.1°F | Resp 18 | Ht 63.0 in | Wt 101.6 lb

## 2021-05-20 DIAGNOSIS — I82431 Acute embolism and thrombosis of right popliteal vein: Secondary | ICD-10-CM | POA: Diagnosis not present

## 2021-05-20 DIAGNOSIS — I2699 Other pulmonary embolism without acute cor pulmonale: Secondary | ICD-10-CM

## 2021-05-20 DIAGNOSIS — D7589 Other specified diseases of blood and blood-forming organs: Secondary | ICD-10-CM | POA: Diagnosis not present

## 2021-05-20 DIAGNOSIS — R918 Other nonspecific abnormal finding of lung field: Secondary | ICD-10-CM | POA: Diagnosis not present

## 2021-05-20 DIAGNOSIS — Z8719 Personal history of other diseases of the digestive system: Secondary | ICD-10-CM | POA: Insufficient documentation

## 2021-05-20 DIAGNOSIS — Z720 Tobacco use: Secondary | ICD-10-CM | POA: Insufficient documentation

## 2021-05-20 DIAGNOSIS — D751 Secondary polycythemia: Secondary | ICD-10-CM | POA: Insufficient documentation

## 2021-05-20 NOTE — Progress Notes (Signed)
?  Sue Chase ?OFFICE PROGRESS NOTE ? ? ?Diagnosis: DVT/pulmonary embolism, lung lesions ? ?INTERVAL HISTORY:  ? ?Sue Chase returns as scheduled.  She continues apixaban anticoagulation.  No bleeding.  No recurrent leg swelling.  No dyspnea. ?She saw Dr. Valeta Harms last week.  A repeat chest CT 05/10/2021 revealed stable part solid nodule in the right upper lobe and stable additional groundglass nodules.  Decreased right lower lobe consolidation.  She is scheduled for a bronchoscopic biopsy next month. ? ? ?Objective: ? ?Vital signs in last 24 hours: ? ?Blood pressure (!) 150/83, pulse 73, temperature 98.1 ?F (36.7 ?C), temperature source Oral, resp. rate 18, height 5\' 3"  (1.6 m), weight 101 lb 9.6 oz (46.1 kg), SpO2 99 %. ?  ? ?Lymphatics: No cervical, supraclavicular, or axillary nodes ?Resp: Lungs clear bilaterally ?Cardio: Regular rate and rhythm ?GI: No hepatosplenomegaly ?Vascular: No leg edema or erythema ? ? ?Lab Results: ? ?Lab Results  ?Component Value Date  ? WBC 7.5 03/15/2021  ? HGB 16.6 (H) 03/15/2021  ? HCT 49.1 (H) 03/15/2021  ? MCV 107.9 (H) 03/15/2021  ? PLT 240 03/15/2021  ? NEUTROABS 4.1 03/15/2021  ? ? ?CMP  ?Lab Results  ?Component Value Date  ? NA 138 03/11/2021  ? K 3.8 03/11/2021  ? CL 99 03/11/2021  ? CO2 30 03/11/2021  ? GLUCOSE 79 03/11/2021  ? BUN 10 03/11/2021  ? CREATININE 0.60 03/11/2021  ? CALCIUM 9.3 03/11/2021  ? PROT 6.1 (L) 02/20/2021  ? ALBUMIN 2.7 (L) 02/20/2021  ? AST 20 02/20/2021  ? ALT 10 02/20/2021  ? ALKPHOS 187 (H) 02/20/2021  ? BILITOT 0.6 03/15/2021  ? GFRNONAA >60 02/21/2021  ? GFRAA >60 09/23/2019  ? ? ? ?Medications: I have reviewed the patient's current medications. ? ? ?Assessment/Plan: ? ?Venous thromboembolic disease ?Right femoral and popliteal DVTs 02/18/2021 ?Bilateral pulmonary embolism with right heart strain on CT 02/18/2021 ?Admission 02/18/2021 for heparin anticoagulation followed by apixaban ?Groundglass right lung densities and right lower  lobe nodule on CT 02/18/2021-referred to Dr. Valeta Harms ?CT chest 05/10/2021-decreased right lower lobe consolidation, stable part solid nodule in the right upper lobe, additional stable bilateral groundglass nodules ?3.   Longstanding tobacco use ?4.   T9 compression fracture on CT 02/18/2021 ?5.   History of colon polyps ?6.   Erythrocytosis  ?7.   Red cell macrocytosis-normal vitamin B12 (low normal) and TSH ? ? ? ?Disposition: ?Sue Chase appears stable.  She continues apixaban anticoagulation for treatment of DVT/pulmonary embolism diagnosed in December 2022.  She is undergoing evaluation of lung nodules by Dr. Valeta Harms.  She is scheduled for bronchoscopy/biopsy next month.  I will see her after the biopsy results are available.  I will recommend indefinite anticoagulation if cancer is confirmed. ? ? ? ?Betsy Coder, MD ? ?05/20/2021  ?11:05 AM ? ? ?

## 2021-06-04 ENCOUNTER — Other Ambulatory Visit: Payer: Self-pay | Admitting: Pulmonary Disease

## 2021-06-05 LAB — SARS CORONAVIRUS 2 (TAT 6-24 HRS): SARS Coronavirus 2: NEGATIVE

## 2021-06-07 ENCOUNTER — Other Ambulatory Visit: Payer: Self-pay

## 2021-06-07 ENCOUNTER — Encounter (HOSPITAL_COMMUNITY): Payer: Self-pay | Admitting: Pulmonary Disease

## 2021-06-07 NOTE — Pre-Procedure Instructions (Signed)
?[facility] [id number] - [place], [place] ?[place] ?[place] [address] ?Phone: [phone] Fax: [phone] ? ? ?PCP - [name], NP ?Cardiologist - Denies ? ? ?Chest x-ray - 02/18/21 ?EKG - 02/26/21 ?ECHO - 02/19/21 ? ? ?Blood Thinner Instructions: Eliquis last dose 06/05/21 ? ? ?ERAS Protcol - NPO ? ?COVID TEST- Neg (06/04/21) ? ?Anesthesia review: N ? ?Patient verbally denies any shortness of breath, fever, cough and chest pain during phone call ? ? ?-------------  SDW INSTRUCTIONS given: ? ?Your procedure is scheduled on 06/08/21. ? Report to [facility] Main Entrance "A" at 0830 A.M., and check in at the Admitting office. ? Call this number if you have problems the morning of surgery: ? [phone] ? ? Remember: ? Do not eat or drink after midnight the night before your surgery ? ?  ? Take these medicines the morning of surgery: ? ?albuterol (VENTOLIN HFA)-if needed (Please bring on day of surgery) ? ?As of today, STOP taking any Aspirin (unless otherwise instructed by your surgeon) Aleve, Naproxen, Ibuprofen, Motrin, Advil, Goody's, BC's, all herbal medications, fish oil, and all vitamins. ? ?         ?           Do not wear jewelry, make up, or nail polish ?           Do not wear lotions, powders, perfumes/colognes, or deodorant. ?           Do not shave 48 hours prior to surgery.  Men may shave face and neck. ?           Do not bring valuables to the hospital. ?           [facility] is not responsible for any belongings or valuables. ? ?Do NOT Smoke (Tobacco/Vaping) 24 hours prior to your procedure ?If you use a CPAP at night, you may bring all equipment for your overnight stay. ?  ?Contacts, glasses, dentures or bridgework may not be worn into surgery.    ?  ?For patients admitted to the hospital, discharge time will be determined by your treatment team. ?  ?Patients discharged the day of surgery will not be allowed to drive home, and someone needs to stay  with them for 24 hours. ? ? ? ?Special instructions:   ?[facility]- Preparing For Surgery ? ?Before surgery, you can play an important role. Because skin is not sterile, your skin needs to be as free of germs as possible. You can reduce the number of germs on your skin by washing with CHG (chlorahexidine gluconate) Soap before surgery.  CHG is an antiseptic cleaner which kills germs and bonds with the skin to continue killing germs even after washing.   ? ?Oral Hygiene is also important to reduce your risk of infection.  Remember - BRUSH YOUR TEETH THE MORNING OF SURGERY WITH YOUR REGULAR TOOTHPASTE ? ?Please do not use if you have an allergy to CHG or antibacterial soaps. If your skin becomes reddened/irritated stop using the CHG.  ?Do not shave (including legs and underarms) for at least 48 hours prior to first CHG shower. It is OK to shave your face. ? ?Please follow these instructions carefully. ?  ?Shower the NIGHT BEFORE SURGERY and the MORNING OF SURGERY with DIAL Soap.  ? ?Pat yourself dry with a CLEAN TOWEL. ? ?Wear CLEAN PAJAMAS to bed the night before surgery ? ?Place CLEAN SHEETS on your bed the night of  your first shower and DO NOT SLEEP WITH PETS. ? ? ?Day of Surgery: ?Please shower morning of surgery  ?Wear Clean/Comfortable clothing the morning of surgery ?Do not apply any deodorants/lotions.   ?Remember to brush your teeth WITH YOUR REGULAR TOOTHPASTE. ?  ?Questions were answered. Patient verbalized understanding of instructions.  ? ? ?   ? ?

## 2021-06-07 NOTE — Anesthesia Preprocedure Evaluation (Addendum)
Anesthesia Evaluation  ?Patient identified by MRN, date of birth, ID band ?Patient awake ? ? ? ?Reviewed: ?Allergy & Precautions, NPO status , Patient's Chart, lab work & pertinent test results ? ?Airway ?Mallampati: III ? ?TM Distance: >3 FB ?Neck ROM: Full ? ? ? Dental ?no notable dental hx. ?(+) Teeth Intact, Dental Advisory Given ?  ?Pulmonary ?Current SmokerPatient did not abstain from smoking., PE ?  ?Pulmonary exam normal ?breath sounds clear to auscultation ? ? ? ? ? ? Cardiovascular ?negative cardio ROS ?Normal cardiovascular exam ?Rhythm:Regular Rate:Normal ? ?TTE 2022 ?1. Left ventricular ejection fraction, by estimation, is 55 to 60%. The  ?left ventricle has normal function. The left ventricle has no regional  ?wall motion abnormalities. Indeterminate diastolic filling due to E-A  ?fusion.  ??2. Right ventricular systolic function is normal. The right ventricular  ?size is normal. There is normal pulmonary artery systolic pressure. The  ?estimated right ventricular systolic pressure is 02.5 mmHg.  ??3. The mitral valve is grossly normal. Trivial mitral valve  ?regurgitation. No evidence of mitral stenosis.  ??4. The aortic valve is grossly normal. Aortic valve regurgitation is not  ?visualized. No aortic stenosis is present.  ??5. The inferior vena cava is normal in size with greater than 50%  ?respiratory variability, suggesting right atrial pressure of 3 mmHg.  ?  ?Neuro/Psych ?negative neurological ROS ? negative psych ROS  ? GI/Hepatic ?negative GI ROS, Neg liver ROS,   ?Endo/Other  ?negative endocrine ROS ? Renal/GU ?negative Renal ROS  ?negative genitourinary ?  ?Musculoskeletal ?negative musculoskeletal ROS ?(+)  ? Abdominal ?  ?Peds ?negative pediatric ROS ?(+)  Hematology ? ?(+) Blood dyscrasia (on eliquis), ,   ?Anesthesia Other Findings ? ? Reproductive/Obstetrics ?negative OB ROS ? ?  ? ? ? ? ? ? ? ? ? ? ? ? ? ?  ?  ? ? ? ? ? ? ? ?Anesthesia  Physical ?Anesthesia Plan ? ?ASA: 3 ? ?Anesthesia Plan: General  ? ?Post-op Pain Management: Minimal or no pain anticipated  ? ?Induction: Intravenous ? ?PONV Risk Score and Plan: Midazolam, Dexamethasone and Ondansetron ? ?Airway Management Planned: Oral ETT ? ?Additional Equipment:  ? ?Intra-op Plan:  ? ?Post-operative Plan: Extubation in OR ? ?Informed Consent: I have reviewed the patients History and Physical, chart, labs and discussed the procedure including the risks, benefits and alternatives for the proposed anesthesia with the patient or authorized representative who has indicated his/her understanding and acceptance.  ? ? ? ?Dental advisory given ? ?Plan Discussed with: CRNA ? ?Anesthesia Plan Comments:   ? ? ? ? ? ? ?Anesthesia Quick Evaluation ? ?

## 2021-06-08 ENCOUNTER — Ambulatory Visit (HOSPITAL_COMMUNITY): Payer: BC Managed Care – PPO | Admitting: Anesthesiology

## 2021-06-08 ENCOUNTER — Ambulatory Visit (HOSPITAL_COMMUNITY)
Admission: RE | Admit: 2021-06-08 | Discharge: 2021-06-08 | Disposition: A | Discharge status: Home / Self Care | Payer: BC Managed Care – PPO | Attending: Pulmonary Disease | Admitting: Pulmonary Disease

## 2021-06-08 ENCOUNTER — Encounter (HOSPITAL_COMMUNITY)
Admission: RE | Disposition: A | Discharge status: Home / Self Care | Payer: Self-pay | Source: Home / Self Care | Attending: Pulmonary Disease

## 2021-06-08 ENCOUNTER — Ambulatory Visit (HOSPITAL_COMMUNITY): Payer: BC Managed Care – PPO

## 2021-06-08 ENCOUNTER — Encounter (HOSPITAL_COMMUNITY): Payer: Self-pay | Admitting: Pulmonary Disease

## 2021-06-08 DIAGNOSIS — F17210 Nicotine dependence, cigarettes, uncomplicated: Secondary | ICD-10-CM | POA: Diagnosis not present

## 2021-06-08 DIAGNOSIS — Z86711 Personal history of pulmonary embolism: Secondary | ICD-10-CM | POA: Insufficient documentation

## 2021-06-08 DIAGNOSIS — E785 Hyperlipidemia, unspecified: Secondary | ICD-10-CM | POA: Diagnosis not present

## 2021-06-08 DIAGNOSIS — C3411 Malignant neoplasm of upper lobe, right bronchus or lung: Secondary | ICD-10-CM | POA: Insufficient documentation

## 2021-06-08 DIAGNOSIS — R918 Other nonspecific abnormal finding of lung field: Secondary | ICD-10-CM

## 2021-06-08 DIAGNOSIS — I2699 Other pulmonary embolism without acute cor pulmonale: Secondary | ICD-10-CM | POA: Diagnosis not present

## 2021-06-08 DIAGNOSIS — Z7901 Long term (current) use of anticoagulants: Secondary | ICD-10-CM | POA: Diagnosis not present

## 2021-06-08 DIAGNOSIS — Z86718 Personal history of other venous thrombosis and embolism: Secondary | ICD-10-CM | POA: Insufficient documentation

## 2021-06-08 DIAGNOSIS — C3490 Malignant neoplasm of unspecified part of unspecified bronchus or lung: Secondary | ICD-10-CM | POA: Diagnosis not present

## 2021-06-08 DIAGNOSIS — D759 Disease of blood and blood-forming organs, unspecified: Secondary | ICD-10-CM | POA: Diagnosis not present

## 2021-06-08 HISTORY — PX: BRONCHIAL NEEDLE ASPIRATION BIOPSY: SHX5106

## 2021-06-08 HISTORY — PX: BRONCHIAL BIOPSY: SHX5109

## 2021-06-08 HISTORY — PX: VIDEO BRONCHOSCOPY WITH RADIAL ENDOBRONCHIAL ULTRASOUND: SHX6849

## 2021-06-08 HISTORY — PX: BRONCHIAL BRUSHINGS: SHX5108

## 2021-06-08 LAB — CBC
HCT: 48.8 % — ABNORMAL HIGH (ref 36.0–46.0)
Hemoglobin: 17.5 g/dL — ABNORMAL HIGH (ref 12.0–15.0)
MCH: 37.5 pg — ABNORMAL HIGH (ref 26.0–34.0)
MCHC: 35.9 g/dL (ref 30.0–36.0)
MCV: 104.5 fL — ABNORMAL HIGH (ref 80.0–100.0)
Platelets: 170 10*3/uL (ref 150–400)
RBC: 4.67 MIL/uL (ref 3.87–5.11)
RDW: 17.2 % — ABNORMAL HIGH (ref 11.5–15.5)
WBC: 6.4 10*3/uL (ref 4.0–10.5)
nRBC: 0 % (ref 0.0–0.2)

## 2021-06-08 IMAGING — DX DG CHEST 1V PORT
1 series · 1 of 1 positions shown · non-contrast
Comparison: [DATE]

CLINICAL DATA: Bronchoscopy with biopsy

EXAM:
PORTABLE CHEST 1 VIEW

[chest ap]
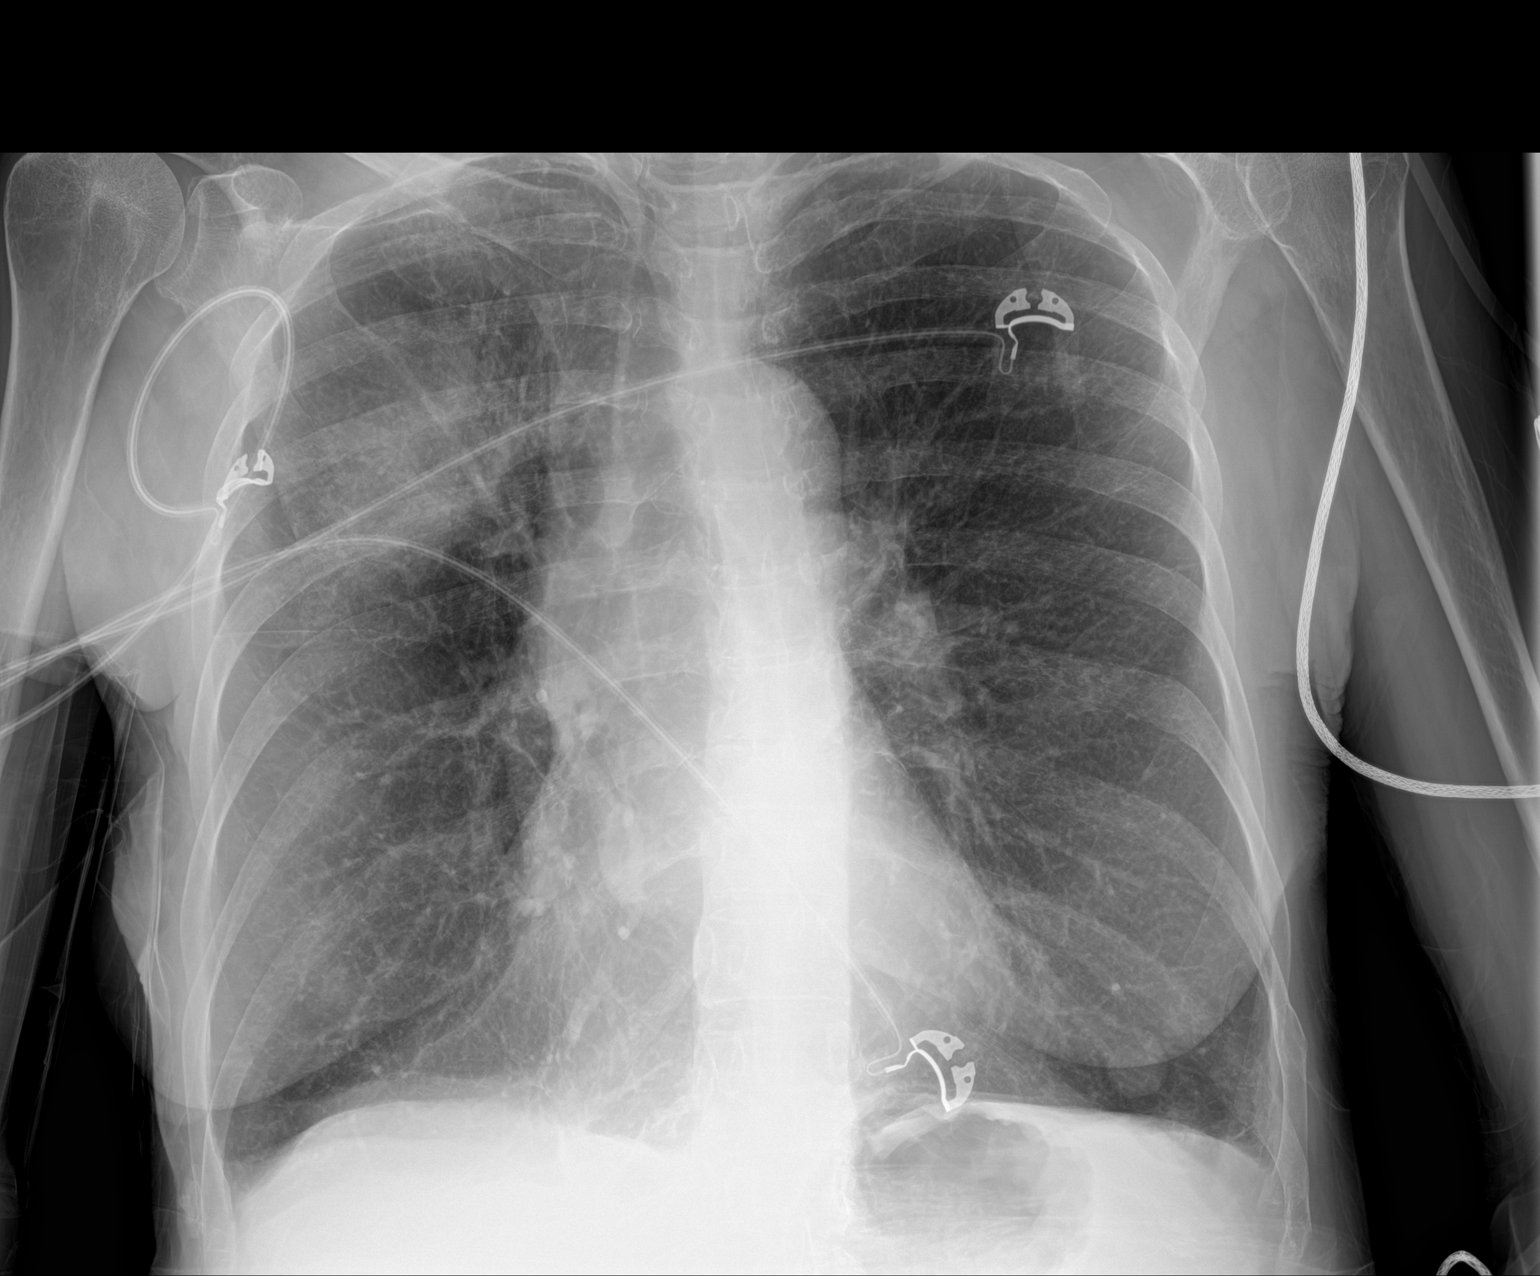

[1 of 1 positions shown; findings below may reference images not displayed]

FINDINGS: The heart size and mediastinal contours are within normal limits.
Hazy opacity in the right upper lobe likely representing post biopsy
changes. No pleural effusion or pneumothorax. The visualized
skeletal structures are unremarkable.
IMPRESSION: Hazy opacity in the right upper lobe likely representing post biopsy
changes. No pneumothorax.

## 2021-06-08 SURGERY — BRONCHOSCOPY, WITH BIOPSY USING ELECTROMAGNETIC NAVIGATION
Anesthesia: General | Laterality: Bilateral

## 2021-06-08 MED ORDER — FENTANYL CITRATE (PF) 100 MCG/2ML IJ SOLN
INTRAMUSCULAR | Status: DC | PRN
  Administered 2021-06-08 (×2): 50 ug via INTRAVENOUS

## 2021-06-08 MED ORDER — LACTATED RINGERS IV SOLN: INTRAVENOUS | Status: DC

## 2021-06-08 MED ORDER — LACTATED RINGERS IV SOLN: INTRAVENOUS | Status: DC | PRN

## 2021-06-08 MED ORDER — CHLORHEXIDINE GLUCONATE 0.12 % MT SOLN
15.0000 mL | Freq: Once | OROMUCOSAL | Status: AC
  Administered 2021-06-08: 15 mL via OROMUCOSAL
  Filled 2021-06-08 (×2): qty 15

## 2021-06-08 MED ORDER — LIDOCAINE 2% (20 MG/ML) 5 ML SYRINGE
INTRAMUSCULAR | Status: DC | PRN
  Administered 2021-06-08: 40 mg via INTRAVENOUS

## 2021-06-08 MED ORDER — ROCURONIUM BROMIDE 10 MG/ML (PF) SYRINGE
PREFILLED_SYRINGE | INTRAVENOUS | Status: DC | PRN
  Administered 2021-06-08: 50 mg via INTRAVENOUS

## 2021-06-08 MED ORDER — ONDANSETRON HCL 4 MG/2ML IJ SOLN
INTRAMUSCULAR | Status: DC | PRN
  Administered 2021-06-08: 4 mg via INTRAVENOUS

## 2021-06-08 MED ORDER — PROPOFOL 10 MG/ML IV BOLUS
INTRAVENOUS | Status: DC | PRN
  Administered 2021-06-08: 90 mg via INTRAVENOUS

## 2021-06-08 MED ORDER — FENTANYL CITRATE (PF) 100 MCG/2ML IJ SOLN: 25.0000 ug | INTRAMUSCULAR | Status: DC | PRN

## 2021-06-08 MED ORDER — DEXAMETHASONE SODIUM PHOSPHATE 10 MG/ML IJ SOLN
INTRAMUSCULAR | Status: DC | PRN
  Administered 2021-06-08: 10 mg via INTRAVENOUS

## 2021-06-08 MED ORDER — MIDAZOLAM HCL 2 MG/2ML IJ SOLN
INTRAMUSCULAR | Status: DC | PRN
  Administered 2021-06-08: 2 mg via INTRAVENOUS

## 2021-06-08 MED ORDER — PHENYLEPHRINE 80 MCG/ML (10ML) SYRINGE FOR IV PUSH (FOR BLOOD PRESSURE SUPPORT)
PREFILLED_SYRINGE | INTRAVENOUS | Status: DC | PRN
  Administered 2021-06-08: 80 ug via INTRAVENOUS
  Administered 2021-06-08: 200 ug via INTRAVENOUS
  Administered 2021-06-08: 40 ug via INTRAVENOUS
  Administered 2021-06-08: 80 ug via INTRAVENOUS

## 2021-06-08 MED ORDER — SUGAMMADEX SODIUM 200 MG/2ML IV SOLN
INTRAVENOUS | Status: DC | PRN
Start: 2021-06-08 — End: 2021-06-08
  Administered 2021-06-08: 200 mg via INTRAVENOUS

## 2021-06-08 NOTE — Anesthesia Procedure Notes (Signed)
Procedure Name: Intubation ?Date/Time: 06/08/2021 10:39 AM ?Performed by: Harden Mo, CRNA ?Pre-anesthesia Checklist: Patient identified, Emergency Drugs available, Suction available and Patient being monitored ?Patient Re-evaluated:Patient Re-evaluated prior to induction ?Oxygen Delivery Method: Circle System Utilized ?Preoxygenation: Pre-oxygenation with 100% oxygen ?Induction Type: IV induction ?Ventilation: Mask ventilation without difficulty ?Laryngoscope Size: Sabra Heck and 2 ?Grade View: Grade I ?Tube type: Oral ?Tube size: 8.5 mm ?Number of attempts: 1 ?Airway Equipment and Method: Stylet and Oral airway ?Placement Confirmation: ETT inserted through vocal cords under direct vision, positive ETCO2 and breath sounds checked- equal and bilateral ?Secured at: 23 cm ?Tube secured with: Tape ?Dental Injury: Teeth and Oropharynx as per pre-operative assessment  ? ? ? ? ?

## 2021-06-08 NOTE — Transfer of Care (Signed)
Immediate Anesthesia Transfer of Care Note ? ?Patient: Maisee Vollman ? ?Procedure(s) Performed: ROBOTIC ASSISTED NAVIGATIONAL BRONCHOSCOPY (Bilateral) ?BRONCHIAL BIOPSIES ?BRONCHIAL BRUSHINGS ?BRONCHIAL NEEDLE ASPIRATION BIOPSIES ?VIDEO BRONCHOSCOPY WITH RADIAL ENDOBRONCHIAL ULTRASOUND ? ?Patient Location: PACU ? ?Anesthesia Type:General ? ?Level of Consciousness: awake, alert  and oriented ? ?Airway & Oxygen Therapy: Patient Spontanous Breathing ? ?Post-op Assessment: Report given to RN, Post -op Vital signs reviewed and stable and Patient moving all extremities X 4 ? ?Post vital signs: Reviewed and stable ? ?Last Vitals:  ?Vitals Value Taken Time  ?BP 132/76 06/08/21 1152  ?Temp    ?Pulse 91 06/08/21 1153  ?Resp 19 06/08/21 1153  ?SpO2 96 % 06/08/21 1153  ?Vitals shown include unvalidated device data. ? ?Last Pain:  ?Vitals:  ? 06/08/21 0925  ?TempSrc:   ?PainSc: 0-No pain  ?   ? ?  ? ?Complications: No notable events documented. ?

## 2021-06-08 NOTE — Op Note (Signed)
Video Bronchoscopy with Robotic Assisted Bronchoscopic Navigation  ? ?Date of Operation: 06/08/2021  ? ?Pre-op Diagnosis: Multiple pulmonary nodules ? ?Post-op Diagnosis: Multiple pulmonary nodules ? ?Surgeon: Garner Nash, DO  ? ?Assistants: None  ? ?Anesthesia: General endotracheal anesthesia ? ?Operation: Flexible video fiberoptic bronchoscopy with robotic assistance and biopsies. ? ?Estimated Blood Loss: Minimal ? ?Complications: None ? ?Indications and History: ?Sue Chase is a 66 y.o. female with history of multiple pulmonary nodules. The risks, benefits, complications, treatment options and expected outcomes were discussed with the patient.  The possibilities of pneumothorax, pneumonia, reaction to medication, pulmonary aspiration, perforation of a viscus, bleeding, failure to diagnose a condition and creating a complication requiring transfusion or operation were discussed with the patient who freely signed the consent.   ? ?Description of Procedure: ?The patient was seen in the Preoperative Area, was examined and was deemed appropriate to proceed.  The patient was taken to [facility] endoscopy room 3, identified as Sue Chase and the procedure verified as Flexible Video Fiberoptic Bronchoscopy.  A Time Out was held and the above information confirmed.  ? ?Prior to the date of the procedure a high-resolution CT scan of the chest was performed. Utilizing ION software program a virtual tracheobronchial tree was generated to allow the creation of distinct navigation pathways to the patient's parenchymal abnormalities. After being taken to the operating room general anesthesia was initiated and the patient  was orally intubated. The video fiberoptic bronchoscope was introduced via the endotracheal tube and a general inspection was performed which showed normal right and left lung anatomy, aspiration of the bilateral mainstems was completed to remove any remaining secretions. Robotic catheter inserted  into patient's endotracheal tube.  ? ?Target #1 right upper lobe: ?The distinct navigation pathways prepared prior to this procedure were then utilized to navigate to patient's lesion identified on CT scan. The robotic catheter was secured into place and the vision probe was withdrawn.  Lesion location was approximated using fluoroscopy, three-dimensional cone beam CT imaging and radial endobronchial ultrasound for peripheral targeting. Under fluoroscopic guidance transbronchial needle brushings, transbronchial needle biopsies, and transbronchial forceps biopsies were performed to be sent for cytology and pathology.  ? ?Target #2 right lower lobe: ?The distinct navigation pathways prepared prior to this procedure were then utilized to navigate to patient's lesion identified on CT scan. The robotic catheter was secured into place and the vision probe was withdrawn.  Lesion location was approximated using fluoroscopy, three-dimensional cone beam CT imaging and radial endobronchial ultrasound for peripheral targeting. Under fluoroscopic guidance transbronchial needle brushings, transbronchial needle biopsies, and transbronchial forceps biopsies were performed to be sent for cytology and pathology. ? ?At the end of the procedure a general airway inspection was performed and there was no evidence of active bleeding. The bronchoscope was removed.  The patient tolerated the procedure well. There was no significant blood loss and there were no obvious complications. A post-procedural chest x-ray is pending. ? ?Samples Target #1: ?1. Transbronchial needle brushings from right upper lobe ?2. Transbronchial Wang needle biopsies from right upper lobe ?3. Transbronchial forceps biopsies from right upper lobe ? ?Samples Target #2: ?1. Transbronchial needle brushings from right lower lobe ?2. Transbronchial Wang needle biopsies from right lower lobe ?3. Transbronchial forceps biopsies from right lower lobe ? ?Plans:  ?The patient  will be discharged from the PACU to home when recovered from anesthesia and after chest x-ray is reviewed. We will review the cytology, pathology results with the patient when they  become available. Outpatient followup will be with Octavio Graves Mechel Schutter, DO.   ? ?Garner Nash, DO ?[facility] ?06/08/2021 11:50 AM   ?

## 2021-06-08 NOTE — Discharge Instructions (Signed)
Flexible Bronchoscopy, Care After ?This sheet gives you information about how to care for yourself after your test. Your doctor may also give you more specific instructions. If you have problems or questions, contact your doctor. ?Follow these instructions at home: ?Eating and drinking ?Do not eat or drink anything (not even water) for 2 hours after your test, or until your numbing medicine (local anesthetic) wears off. ?When your numbness is gone and your cough and gag reflexes have come back, you may: ?Eat only soft foods. ?Slowly drink liquids. ?The day after the test, go back to your normal diet. ?Driving ?Do not drive for 24 hours if you were given a medicine to help you relax (sedative). ?Do not drive or use heavy machinery while taking prescription pain medicine. ?General instructions ? ?Take over-the-counter and prescription medicines only as told by your doctor. ?Return to your normal activities as told. Ask what activities are safe for you. ?Do not use any products that have nicotine or tobacco in them. This includes cigarettes and e-cigarettes. If you need help quitting, ask your doctor. ?Keep all follow-up visits as told by your doctor. This is important. It is very important if you had a tissue sample (biopsy) taken. ?Get help right away if: ?You have shortness of breath that gets worse. ?You get light-headed. ?You feel like you are going to pass out (faint). ?You have chest pain. ?You cough up: ?More than a little blood. ?More blood than before. ?Summary ?Do not eat or drink anything (not even water) for 2 hours after your test, or until your numbing medicine wears off. ?Do not use cigarettes. Do not use e-cigarettes. ?Get help right away if you have chest pain. ? ?This information is not intended to replace advice given to you by your health care provider. Make sure you discuss any questions you have with your health care provider. ?Document Released: 12/05/2008 Document Revised: 01/20/2017 Document  Reviewed: 02/26/2016 ?Elsevier Patient Education ? [place]. ? ?

## 2021-06-08 NOTE — Interval H&P Note (Signed)
History and Physical Interval Note: ? ?06/08/2021 ?9:14 AM ? ?Sue Chase  has presented today for surgery, with the diagnosis of lung nodules.  The various methods of treatment have been discussed with the patient and family. After consideration of risks, benefits and other options for treatment, the patient has consented to  Procedure(s) with comments: ?ROBOTIC ASSISTED NAVIGATIONAL BRONCHOSCOPY (Bilateral) - ION w/ CIOS as a surgical intervention.  The patient's history has been reviewed, patient examined, no change in status, stable for surgery.  I have reviewed the patient's chart and labs.  Questions were answered to the patient's satisfaction.   ? ? ?Octavio Graves Aftyn Nott ? ? ?

## 2021-06-08 NOTE — Anesthesia Postprocedure Evaluation (Signed)
Anesthesia Post Note ? ?Patient: Sue Chase ? ?Procedure(s) Performed: ROBOTIC ASSISTED NAVIGATIONAL BRONCHOSCOPY (Bilateral) ?BRONCHIAL BIOPSIES ?BRONCHIAL BRUSHINGS ?BRONCHIAL NEEDLE ASPIRATION BIOPSIES ?VIDEO BRONCHOSCOPY WITH RADIAL ENDOBRONCHIAL ULTRASOUND ? ?  ? ?Patient location during evaluation: PACU ?Anesthesia Type: General ?Level of consciousness: awake and alert ?Pain management: pain level controlled ?Vital Signs Assessment: post-procedure vital signs reviewed and stable ?Respiratory status: spontaneous breathing, nonlabored ventilation, respiratory function stable and patient connected to nasal cannula oxygen ?Cardiovascular status: blood pressure returned to baseline and stable ?Postop Assessment: no apparent nausea or vomiting ?Anesthetic complications: no ? ? ?No notable events documented. ? ?Last Vitals:  ?Vitals:  ? 06/08/21 1208 06/08/21 1223  ?BP: 123/78 121/82  ?Pulse: 96 94  ?Resp: 14 12  ?Temp:  37 ?C  ?SpO2: 91% 94%  ?  ?Last Pain:  ?Vitals:  ? 06/08/21 1223  ?TempSrc:   ?PainSc: 0-No pain  ? ? ?  ?  ?  ?  ?  ?  ? ?Elih Mooney L Marialena Wollen ? ? ? ? ?

## 2021-06-09 ENCOUNTER — Encounter (HOSPITAL_COMMUNITY): Payer: Self-pay | Admitting: Pulmonary Disease

## 2021-06-09 LAB — CYTOLOGY - NON PAP

## 2021-06-15 ENCOUNTER — Encounter: Payer: Self-pay | Admitting: Acute Care

## 2021-06-15 ENCOUNTER — Ambulatory Visit (INDEPENDENT_AMBULATORY_CARE_PROVIDER_SITE_OTHER): Payer: BC Managed Care – PPO | Admitting: Acute Care

## 2021-06-15 VITALS — BP 118/74 | HR 66 | Temp 97.8°F | Ht 63.0 in | Wt 102.0 lb

## 2021-06-15 DIAGNOSIS — C3490 Malignant neoplasm of unspecified part of unspecified bronchus or lung: Secondary | ICD-10-CM

## 2021-06-15 DIAGNOSIS — I2699 Other pulmonary embolism without acute cor pulmonale: Secondary | ICD-10-CM | POA: Diagnosis not present

## 2021-06-15 DIAGNOSIS — O2230 Deep phlebothrombosis in pregnancy, unspecified trimester: Secondary | ICD-10-CM

## 2021-06-15 DIAGNOSIS — F17210 Nicotine dependence, cigarettes, uncomplicated: Secondary | ICD-10-CM | POA: Diagnosis not present

## 2021-06-15 DIAGNOSIS — Z720 Tobacco use: Secondary | ICD-10-CM

## 2021-06-15 NOTE — Patient Instructions (Addendum)
It is good to see you today ?Your biopsies were + for adenocarcinoma. ?This is a type of lung cancer. ?I have referred you to Medical Oncology ?You will get a call to get scheduled with Dr. Julien Nordmann.  ?I have also ordered a PET scan to help stage your lung cancer. ?I have ordered an MRI brain. ?I will ask the PCC's to schedule your MRI brain asap and at an outpatient scanner if possible.  ?Call us if you have any questions about what we discussed today.  ?Continue Eliquis per Dr. Benay Spice.  ?Follow up with Dr. Benay Spice as is already scheduled.  ?Please contact office for sooner follow up if symptoms do not improve or worsen or seek emergency care   ?

## 2021-06-15 NOTE — Progress Notes (Signed)
? ?History of Present Illness ?Sue Chase is a 66 y.o. female current every day smoker with past medical history of hyperlipidemia, seasonal allergies.  Longstanding history of smoking since a teenager.  Patient shared this  past fall her husband was diagnosed pancreatic cancer she had been going in and out of the hospital.  Unfortunately he passed from his disease In 03/30/21.  She developed swelling and lower extremity edema  and she continued to walk on it despite this. She  had continued swelling in the right leg.  Ultimately she came to the ER 01/2021 and  had an ultrasound which confirmed a DVT. CT scan of the chest revealed submassive PE.  She was placed on anticoagulation at discharge. CT scan of the chest at the time also revealed multiple areas of pulmonary nodules.  She had the spiculated larger lesion in the right upper lobe adjacent to the major fissure that also had some puckering and pulling of the major fissure line. She was referred to Dr. Valeta Harms  for evaluation of potential concern for underlying malignancy. ? ?Pt underwent Flexible video fiberoptic bronchoscopy with robotic assistance and biopsies on 06/08/2021 and Guardant 360 blood testing.Cytology is positive for adenocarcinoma. I have not seen the results of the blood testing.  ? ? ?06/15/2021 ?Pt. Presents for follow up. She has done well after her bronchoscopy and biopsies. She said she had a lot of neck pain inially, which has resolved. Her throat is a little scratchy, but getting better. She denies any bloody secretions after the procedure. No sore throat. She has restarted her Eliquis with no bleeding issues. We discussed her biopsy results were positive for adenocarcinoma and that her best option is for referral to medical oncology with a staging PET scan and MRI brain. She is in agreement with this plan. She was here alone today, but has a daughter who lives in town and is her support. Her husband passed away in 03/30/2022. She  was tearful in the office today with the diagnosis. She denies any chest pain, orthopnea or edema.  ? ? ?Test Results: ?06/08/2021 Cytology ?FINAL MICROSCOPIC DIAGNOSIS:  ?A. LUNG, RUL, FINE NEEDLE ASPIRATION:  ?- Malignant cells, adenocarcinoma, tumor fragments in the cell block  ?have a lepidic pattern (non-mucinous).  ? ?B. LUNG, RUL, BRUSHING:  ?- Malignant cells, consistent with adenocarcinoma.  ? ?05/12/2021 Super D CT Chest ?Stable part solid nodule of the right upper lobe. Additional ?bilateral ground-glass nodules are also stable. Findings are ?concerning for multifocal adenocarcinoma. ?2. Decreased right lower lobe consolidation with residual linear ?opacities, likely resolving pulmonary infarct. ?3. Aortic Atherosclerosis (ICD10-I70.0) and Emphysema (ICD10-J43.9). ? ? ? ?  Latest Ref Rng & Units 06/08/2021  ?  9:23 AM 03/15/2021  ?  2:48 PM 03/11/2021  ? 10:52 AM  ?CBC  ?WBC 4.0 - 10.5 K/uL 6.4   7.5   8.1    ?Hemoglobin 12.0 - 15.0 g/dL 17.5   16.6   16.8    ?Hematocrit 36.0 - 46.0 % 48.8   49.1   50.7    ?Platelets 150 - 400 K/uL 170   240   319.0    ? ? ? ?  Latest Ref Rng & Units 03/11/2021  ? 10:52 AM 02/26/2021  ?  1:57 PM 02/21/2021  ?  5:31 AM  ?BMP  ?Glucose 70 - 99 mg/dL 79   95   94    ?BUN 6 - 23 mg/dL 10   14   6     ?  Creatinine 0.40 - 1.20 mg/dL 0.60   0.58   0.38    ?Sodium 135 - 145 mEq/L 138   133   133    ?Potassium 3.5 - 5.1 mEq/L 3.8   4.6   2.7    ?Chloride 96 - 112 mEq/L 99   95   99    ?CO2 19 - 32 mEq/L 30   29   26     ?Calcium 8.4 - 10.5 mg/dL 9.3   10.2   8.5    ? ? ?BNP ?   ?Component Value Date/Time  ? BNP 111.9 (H) 02/18/2021 2044  ? ? ?ProBNP ?   ?Component Value Date/Time  ? PROBNP 26.0 06/29/2017 1002  ? ? ?PFT ?No results found for: FEV1PRE, FEV1POST, FVCPRE, FVCPOST, TLC, DLCOUNC, PREFEV1FVCRT, PSTFEV1FVCRT ? ?DG Chest Port 1 View ? ?Result Date: 06/08/2021 ?CLINICAL DATA:  Bronchoscopy with biopsy EXAM: PORTABLE CHEST 1 VIEW COMPARISON:  05/10/2021 FINDINGS: The heart size and  mediastinal contours are within normal limits. Hazy opacity in the right upper lobe likely representing post biopsy changes. No pleural effusion or pneumothorax. The visualized skeletal structures are unremarkable. IMPRESSION: Hazy opacity in the right upper lobe likely representing post biopsy changes. No pneumothorax. Electronically Signed   By: Davina Poke D.O.   On: 06/08/2021 12:36  ? ?DG C-ARM BRONCHOSCOPY ? ?Result Date: 06/08/2021 ?C-ARM BRONCHOSCOPY: Fluoroscopy was utilized by the requesting physician.  No radiographic interpretation.   ? ? ?Past medical hx ?Past Medical History:  ?Diagnosis Date  ? Colon polyps   ? Hyperlipidemia   ? Rheumatic fever   ? Seasonal allergies   ? UTI (urinary tract infection)   ?  ? ?Social History  ? ?Tobacco Use  ? Smoking status: Some Days  ?  Packs/day: 1.00  ?  Years: 40.00  ?  Pack years: 40.00  ?  Types: Cigarettes  ?  Passive exposure: Never  ? Smokeless tobacco: Never  ? Tobacco comments:  ?  1/2 ppd or less 06/15/21. Hsm/.   ?Vaping Use  ? Vaping Use: Never used  ?Substance Use Topics  ? Alcohol use: Yes  ?  Comment: Rum and Coke/Unsure of amount  ? Drug use: Never  ? ? ?[name] reports that she has been smoking cigarettes. She has a 40.00 pack-year smoking history. She has never been exposed to tobacco smoke. She has never used smokeless tobacco. She reports current alcohol use. She reports that she does not use drugs. ? ?Tobacco Cessation: ?I have spent 3 minutes counseling patient on smoking cessation this visit. We have reviewed the risks of continued smoking on her current health situation. Patient verbalizes understanding that  continuing to smoke  smoking has negative health consequences including worsening of COPD, risk of lung cancer , stroke and heart disease..    ? ?Past surgical hx, Family hx, Social hx all reviewed. ? ?Current Outpatient Medications on File Prior to Visit  ?Medication Sig  ? albuterol (VENTOLIN HFA) 108 (90 Base) MCG/ACT inhaler  Inhale 2 puffs into the lungs every 6 (six) hours as needed for wheezing or shortness of breath.  ? apixaban (ELIQUIS) 5 MG TABS tablet Take 1 tablet (5 mg total) by mouth 2 (two) times daily.  ? nicotine (NICODERM CQ - DOSED IN MG/24 HOURS) 14 mg/24hr patch Place 1 patch (14 mg total) onto the skin daily.  ? omeprazole (PRILOSEC) 20 MG capsule Take 1 capsule (20 mg total) by mouth daily. (Patient taking differently: Take 20 mg  by mouth as needed.)  ? potassium chloride SA (KLOR-CON M) 20 MEQ tablet Take 1 tablet (20 mEq total) by mouth daily.  ? ?No current facility-administered medications on file prior to visit.  ?  ? ?Allergies  ?Allergen Reactions  ? Codeine Itching  ? ? ?Review Of Systems: ? ?Constitutional:   No  weight loss, night sweats,  Fevers, chills, fatigue, or  lassitude. ? ?HEENT:   No headaches,  Difficulty swallowing,  Tooth/dental problems, or  Sore throat,  ?              No sneezing, itching, ear ache, nasal congestion, post nasal drip,  ? ?CV:  No chest pain,  Orthopnea, PND, swelling in lower extremities, anasarca, dizziness, palpitations, syncope.  ? ?GI  No heartburn, indigestion, abdominal pain, nausea, vomiting, diarrhea, change in bowel habits, loss of appetite, bloody stools.  ? ?Resp: No shortness of breath with exertion or at rest.  No excess mucus, no productive cough,  No non-productive cough,  No coughing up of blood.  No change in color of mucus.  No wheezing.  No chest wall deformity ? ?Skin: no rash or lesions. ? ?GU: no dysuria, change in color of urine, no urgency or frequency.  No flank pain, no hematuria  ? ?MS:  No joint pain or swelling.  No decreased range of motion.  No back pain. ? ?Psych:  No change in mood or affect. No depression or anxiety.  No memory loss. ? ? ?Vital Signs ?BP 118/74 (BP Location: Left Arm, Patient Position: Sitting, Cuff Size: Normal)   Pulse 66   Temp 97.8 ?F (36.6 ?C) (Oral)   Ht 5\' 3"  (1.6 m)   Wt 102 lb (46.3 kg)   SpO2 99%   BMI 18.07  kg/m?  ? ? ?Physical Exam: ? ?General- No distress,  A&Ox3, pleasant  ?ENT: No sinus tenderness, TM clear, pale nasal mucosa, no oral exudate,no post nasal drip, no LAN ?Cardiac: S1, S2, regular rate and rhythm, no

## 2021-06-16 ENCOUNTER — Encounter: Payer: Self-pay | Admitting: *Deleted

## 2021-06-16 NOTE — Progress Notes (Signed)
Foundation one testing and PDL-1 testing ordered from Accession number [id number] ? ?

## 2021-06-17 ENCOUNTER — Encounter: Payer: Self-pay | Admitting: *Deleted

## 2021-06-17 ENCOUNTER — Telehealth: Payer: Self-pay | Admitting: Acute Care

## 2021-06-17 ENCOUNTER — Other Ambulatory Visit: Payer: Self-pay | Admitting: *Deleted

## 2021-06-17 NOTE — Progress Notes (Signed)
The proposed treatment discussed in cancer conference is for discussion purpose only and is not a binding recommendation. The patient was not physically examined nor present for their treatment options. Therefore, final treatment plans cannot be decided.  ?

## 2021-06-17 NOTE — Telephone Encounter (Signed)
June 17, 2021 ?Sue Spatz, NP ?to Me   ?  12:16 PM ? Sue Chase, does she have someone to drive her? I cannot give her anything unless she has a designated driver to and from the scan ? ?Spoke with the pt and she says her daughter will be taking her to and from the MRI appt.  ?Routing back to Judson Roch ?

## 2021-06-17 NOTE — Telephone Encounter (Signed)
Sue Chase, pt's MRI is scheduled for 06/25/21  ?Are you okay with sending her in something for claustrophobia? ?Her pharm is already updated- [facility] ? ?Thanks! ? ? ?

## 2021-06-17 NOTE — Progress Notes (Signed)
Spoke with Norton Blizzard, Lung Cancer Navigator and she stated that Guardant 360 and PDL 1 testing were requested on 06/08/2021 when biopsy obtained.   ? ?Will continue to follow for results ?

## 2021-06-18 ENCOUNTER — Other Ambulatory Visit: Payer: Self-pay | Admitting: Acute Care

## 2021-06-18 DIAGNOSIS — F409 Phobic anxiety disorder, unspecified: Secondary | ICD-10-CM

## 2021-06-18 MED ORDER — ALPRAZOLAM 0.25 MG PO TABS: 0.2500 mg | ORAL_TABLET | Freq: Once | ORAL | 0 refills | Status: AC

## 2021-06-18 NOTE — Telephone Encounter (Signed)
Sue Spatz, NP ?to Me   ?  11:08 AM ? RX sent. Please make sure she knows not to use for PET scan, just for MRI. Thanks  ? ?Called the pt and there was no answer, and her VM was not set up. Will call back.  ?

## 2021-06-21 NOTE — Telephone Encounter (Signed)
Called and spoke with patient, advised of recommendations per Eric Form NP.  Patient states the instructions were on the bottle.  Nothing further needed. ?

## 2021-06-22 ENCOUNTER — Inpatient Hospital Stay: Payer: BC Managed Care – PPO | Attending: Oncology | Admitting: Oncology

## 2021-06-22 ENCOUNTER — Encounter: Payer: Self-pay | Admitting: *Deleted

## 2021-06-22 VITALS — BP 145/76 | HR 70 | Temp 98.2°F | Resp 18 | Ht 63.0 in | Wt 103.2 lb

## 2021-06-22 DIAGNOSIS — I82401 Acute embolism and thrombosis of unspecified deep veins of right lower extremity: Secondary | ICD-10-CM | POA: Diagnosis not present

## 2021-06-22 DIAGNOSIS — F17210 Nicotine dependence, cigarettes, uncomplicated: Secondary | ICD-10-CM | POA: Diagnosis not present

## 2021-06-22 DIAGNOSIS — Z79899 Other long term (current) drug therapy: Secondary | ICD-10-CM | POA: Diagnosis not present

## 2021-06-22 DIAGNOSIS — C3491 Malignant neoplasm of unspecified part of right bronchus or lung: Secondary | ICD-10-CM | POA: Diagnosis not present

## 2021-06-22 DIAGNOSIS — F1290 Cannabis use, unspecified, uncomplicated: Secondary | ICD-10-CM | POA: Insufficient documentation

## 2021-06-22 DIAGNOSIS — Z7901 Long term (current) use of anticoagulants: Secondary | ICD-10-CM | POA: Diagnosis not present

## 2021-06-22 DIAGNOSIS — E785 Hyperlipidemia, unspecified: Secondary | ICD-10-CM | POA: Diagnosis not present

## 2021-06-22 DIAGNOSIS — F32A Depression, unspecified: Secondary | ICD-10-CM | POA: Diagnosis not present

## 2021-06-22 DIAGNOSIS — Z87440 Personal history of urinary (tract) infections: Secondary | ICD-10-CM | POA: Insufficient documentation

## 2021-06-22 DIAGNOSIS — R110 Nausea: Secondary | ICD-10-CM | POA: Diagnosis not present

## 2021-06-22 DIAGNOSIS — R197 Diarrhea, unspecified: Secondary | ICD-10-CM | POA: Insufficient documentation

## 2021-06-22 DIAGNOSIS — I2699 Other pulmonary embolism without acute cor pulmonale: Secondary | ICD-10-CM | POA: Insufficient documentation

## 2021-06-22 DIAGNOSIS — R5383 Other fatigue: Secondary | ICD-10-CM | POA: Insufficient documentation

## 2021-06-22 DIAGNOSIS — I00 Rheumatic fever without heart involvement: Secondary | ICD-10-CM | POA: Insufficient documentation

## 2021-06-22 DIAGNOSIS — R059 Cough, unspecified: Secondary | ICD-10-CM | POA: Diagnosis not present

## 2021-06-22 NOTE — Progress Notes (Signed)
?  [place] ?OFFICE PROGRESS NOTE ? ? ?Diagnosis: DVT/pulmonary embolism, non-small cell lung cancer ? ?INTERVAL HISTORY:  ? ?[name] underwent a navigation bronchoscopy by Dr. Valeta Harms on 06/08/2021.  She had a sore neck following the procedure.  Transbronchial biopsies and brushings were obtained from right upper lobe and right lower lobe lesions ?The cytology from the right upper lobe returned with malignant cells consistent with adenocarcinoma.  The pathology from the right lower lobe revealed malignant cells consistent with adenocarcinoma. ? ?The right upper lobe Guardant360 confirmed a TPS of 4%. ? ?She continues anticoagulation therapy.  No bleeding or symptom of recurrent thrombosis. ?Objective: ? ?Vital signs in last 24 hours: ? ?Blood pressure (!) 145/76, pulse 70, temperature 98.2 ?F (36.8 ?C), temperature source Oral, resp. rate 18, height $RemoveBe'5\' 3"'khMcvoGIX$  (1.6 m), weight 103 lb 3.2 oz (46.8 kg), SpO2 98 %. ?  ? ?Lymphatics: Cervical, supraclavicular, or axillary nodes ?Resp: Lungs with end inspiratory rhonchi at the left posterior base, no respiratory distress ?Cardio: Regular rate and rhythm ?GI: No hepatosplenomegaly ?Vascular: No leg edema ? ?Lab Results: ? ?Lab Results  ?Component Value Date  ? WBC 6.4 06/08/2021  ? HGB 17.5 (H) 06/08/2021  ? HCT 48.8 (H) 06/08/2021  ? MCV 104.5 (H) 06/08/2021  ? PLT 170 06/08/2021  ? NEUTROABS 4.1 03/15/2021  ? ? ?CMP  ?Lab Results  ?Component Value Date  ? NA 138 03/11/2021  ? K 3.8 03/11/2021  ? CL 99 03/11/2021  ? CO2 30 03/11/2021  ? GLUCOSE 79 03/11/2021  ? BUN 10 03/11/2021  ? CREATININE 0.60 03/11/2021  ? CALCIUM 9.3 03/11/2021  ? PROT 6.1 (L) 02/20/2021  ? ALBUMIN 2.7 (L) 02/20/2021  ? AST 20 02/20/2021  ? ALT 10 02/20/2021  ? ALKPHOS 187 (H) 02/20/2021  ? BILITOT 0.6 03/15/2021  ? GFRNONAA >60 02/21/2021  ? GFRAA >60 09/23/2019  ? ? ?Medications: I have reviewed the patient's current medications. ? ? ?Assessment/Plan: ?Venous thromboembolic  disease ?Right femoral and popliteal DVTs 02/18/2021 ?Bilateral pulmonary embolism with right heart strain on CT 02/18/2021 ?Admission 02/18/2021 for heparin anticoagulation followed by apixaban ?Groundglass right lung densities and right lower lobe nodule on CT 02/18/2021-referred to Dr. Valeta Harms ?CT chest 05/10/2021-decreased right lower lobe consolidation, stable part solid nodule in the right upper lobe, additional stable bilateral groundglass nodules ?Bronchoscopy 06/08/2021-FNA and brushing from right lower lobe lesion-adenocarcinoma, FNA and brushing from right upper lobe lesion-adenocarcinoma ?PD-L1 TPS 4% on right upper lobe lesion ?3.   Longstanding tobacco use ?4.   T9 compression fracture on CT 02/18/2021 ?5.   History of colon polyps ?6.   Erythrocytosis  ?7.   Red cell macrocytosis-normal vitamin B12 (low normal) and TSH ? ? ? ? ? ?Disposition: ?Sue Chase appears stable.  She has been diagnosed with non-small cell lung cancer.  She is scheduled for for a staging PET scan 07/02/2021.  She will return for an office visit 07/07/2021 to discuss the PET results and formulate a treatment plan. ? ?We discussed potential treatment options including surgery, SRS, and systemic therapy.  The treatment recommendation will depend on the PET results. ? ?Betsy Coder, MD ? ?06/22/2021  ?11:20 AM ? ? ?

## 2021-06-22 NOTE — Progress Notes (Signed)
PATIENT NAVIGATOR PROGRESS NOTE ? ?Name: Sue Chase ?Date: 06/22/2021 ?MRN: [id number]  ?DOB: 10/07/1955 ? ? ?Reason for visit:  ?F/U appt with Dr Benay Spice new diagnosis of lung cancer ? ?Comments:  Met with [name] during visit with Dr Benay Spice  ?She is having PET scan on 5/12 and Brain MRI on 5/5 ?F/U appt with Dr Benay Spice to discuss findings on 5/17 ?Referral placed for Financial counseling ?Discussed recent loss of spouse in December, offered supportive listening, stated that she has good support from daughters, one lives locally and the other in [place]. Offered SW consult and stated that she would let me know ?Reviewed contact information and encouraged her to call with any issues or questions. ? ? ? ? ?Time spent counseling/coordinating care: > 60 minutes ? ?

## 2021-06-23 ENCOUNTER — Telehealth: Payer: Self-pay | Admitting: Acute Care

## 2021-06-23 NOTE — Telephone Encounter (Signed)
I have called the patient . We have resolved the issue of her follow up. Nothing further is needed. ?

## 2021-06-23 NOTE — Telephone Encounter (Signed)
I had a message that Sue Chase had called, very upset about her follow up for newly diagnosed NSCLC. We have formulated a plan for her follow up. I will call her tomorrow after I have checked into her concerns. She was calm, and verbalized understanding at completion of the call.  ?

## 2021-06-24 ENCOUNTER — Telehealth: Payer: Self-pay | Admitting: Internal Medicine

## 2021-06-24 NOTE — Telephone Encounter (Signed)
Scheduled appt per 5/3 staff msg from [employer]. Pt is aware of appt date and time. Pt is aware to arrive 15 mins prior to appt time and to bring and updated insurance card. Pt is aware of appt location.   ?

## 2021-06-25 ENCOUNTER — Ambulatory Visit (HOSPITAL_COMMUNITY)
Admission: RE | Admit: 2021-06-25 | Discharge: 2021-06-25 | Disposition: A | Discharge status: Home / Self Care | Payer: BC Managed Care – PPO | Source: Ambulatory Visit | Attending: Acute Care | Admitting: Acute Care

## 2021-06-25 DIAGNOSIS — I2510 Atherosclerotic heart disease of native coronary artery without angina pectoris: Secondary | ICD-10-CM | POA: Diagnosis not present

## 2021-06-25 DIAGNOSIS — C3490 Malignant neoplasm of unspecified part of unspecified bronchus or lung: Secondary | ICD-10-CM | POA: Diagnosis not present

## 2021-06-25 DIAGNOSIS — I2699 Other pulmonary embolism without acute cor pulmonale: Secondary | ICD-10-CM | POA: Diagnosis not present

## 2021-06-25 DIAGNOSIS — K5730 Diverticulosis of large intestine without perforation or abscess without bleeding: Secondary | ICD-10-CM | POA: Diagnosis not present

## 2021-06-25 LAB — GLUCOSE, CAPILLARY: Glucose-Capillary: 101 mg/dL — ABNORMAL HIGH (ref 70–99)

## 2021-06-25 IMAGING — CT NM PET TUM IMG INITIAL (PI) SKULL BASE T - THIGH
8 series · 25 of 25 positions shown · non-contrast
Comparison: Chest CT [DATE] and [DATE]

CLINICAL DATA: Initial treatment strategy for non-small cell lung
cancer.

EXAM:
NUCLEAR MEDICINE PET SKULL BASE TO THIGH
TECHNIQUE: 5.1 mCi F-18 FDG was injected intravenously. Full-ring PET imaging
was performed from the skull base to thigh after the radiotracer. CT
data was obtained and used for attenuation correction and anatomic
localization.
Fasting blood glucose: 101 mg/dl

[Series 3: pet sk_thigh ac · axial · 5.0mm · 4.07mm/px · z∈[-808,+12]mm · 5 of 206 slices shown]
[im 1/206]
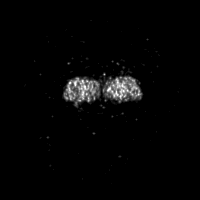
[im 52/206]
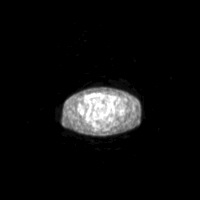
[im 103/206]
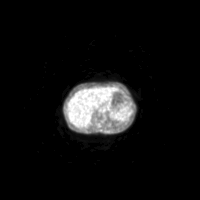
[im 154/206]
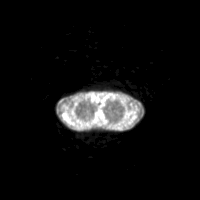
[im 206/206]
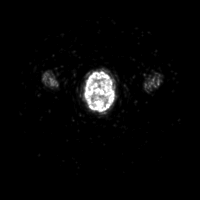

[Series 4: ct sk_thigh 5.0 br38 · axial · 5.0mm · 0.98mm/px · z∈[-808,+12]mm · 5 of 206 slices shown]
[im 1/206]
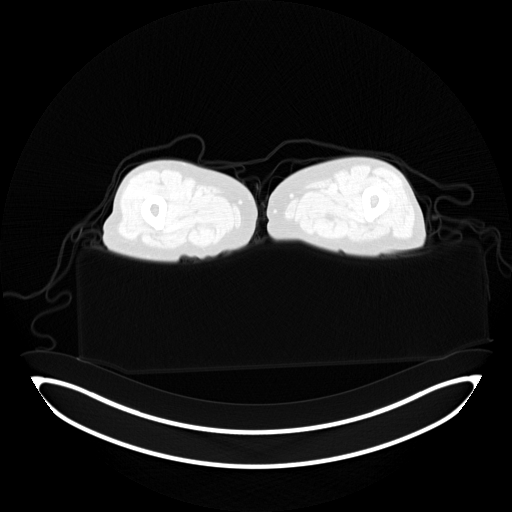
[im 52/206]
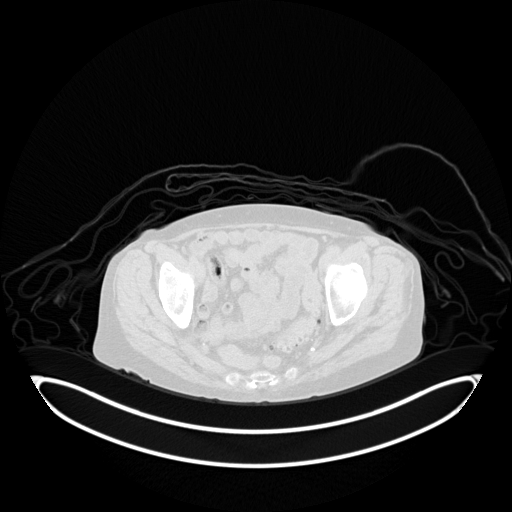
[im 103/206]
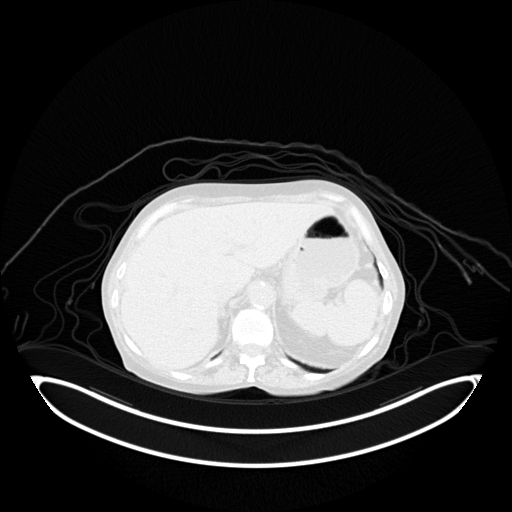
[im 154/206]
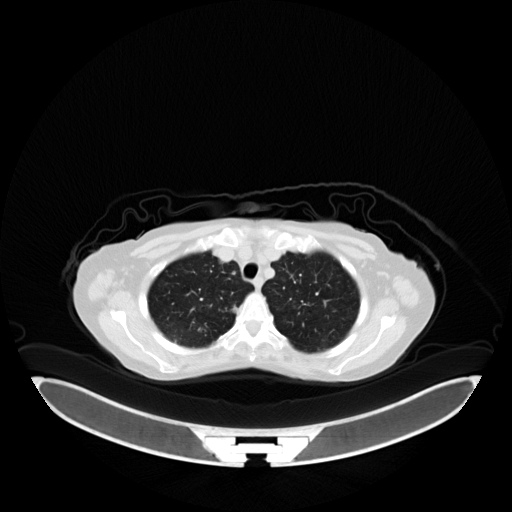
[im 206/206  brain]
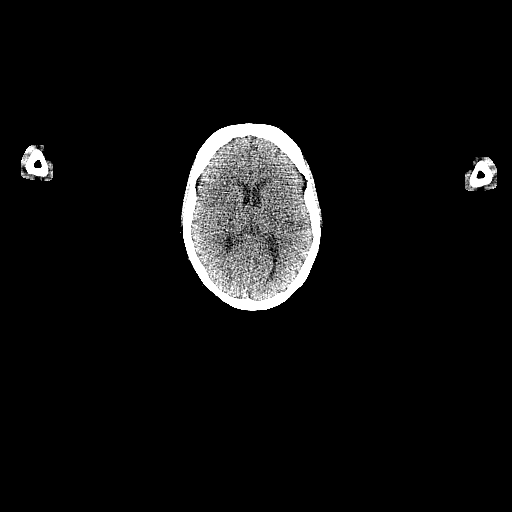

[Series 5: pet sk_thigh nac · axial · 5.0mm · 4.07mm/px · z∈[-808,+12]mm · 5 of 206 slices shown]
[im 1/206]
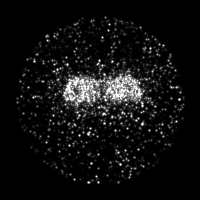
[im 52/206]
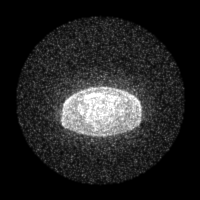
[im 103/206]
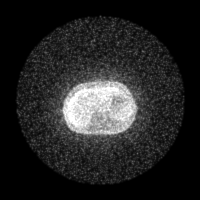
[im 154/206]
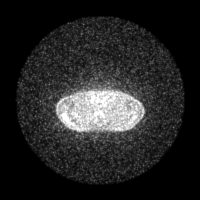
[im 206/206]
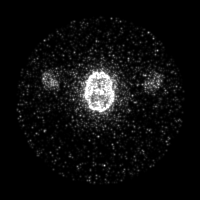

[Series 7: ct 5.0 bl57 lung_bone · axial · 5.0mm · 0.71mm/px · z∈[-408,-136]mm · 2 of 69 slices shown]
[im 1/69]
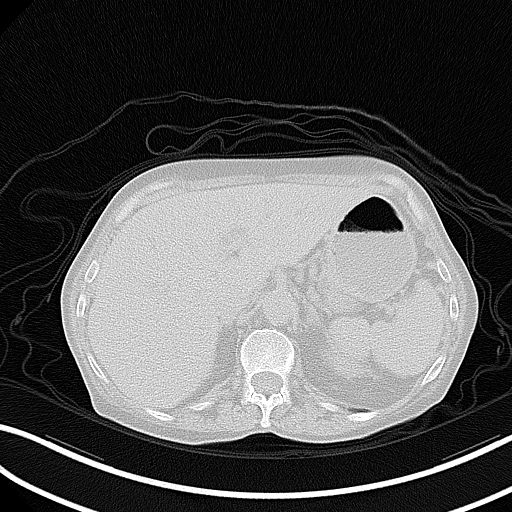
[im 69/69]
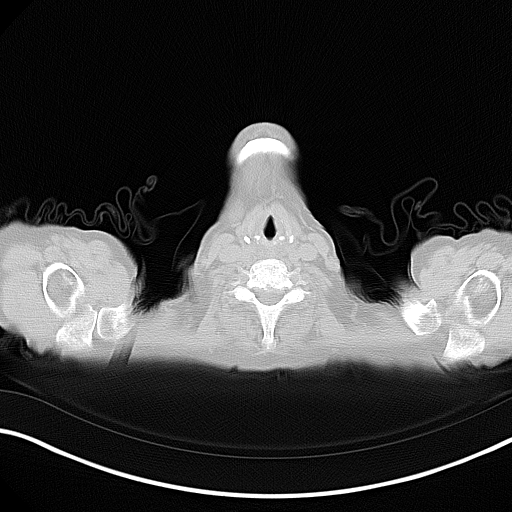

[Series 603: fused tra · 5 of 204 slices shown]
[im 1/204]
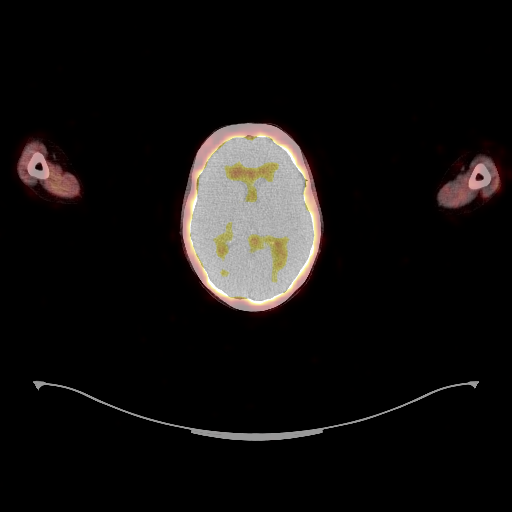
[im 51/204]
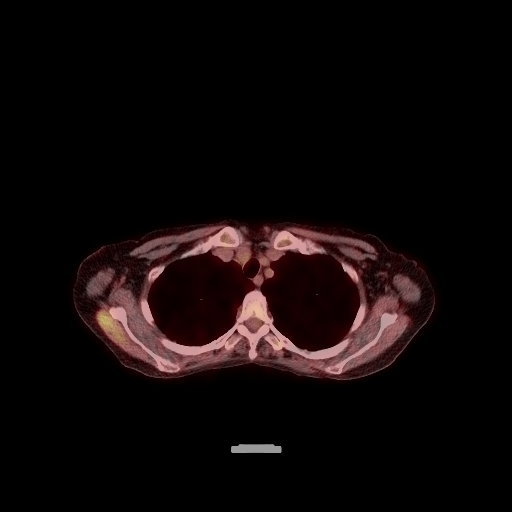
[im 102/204]
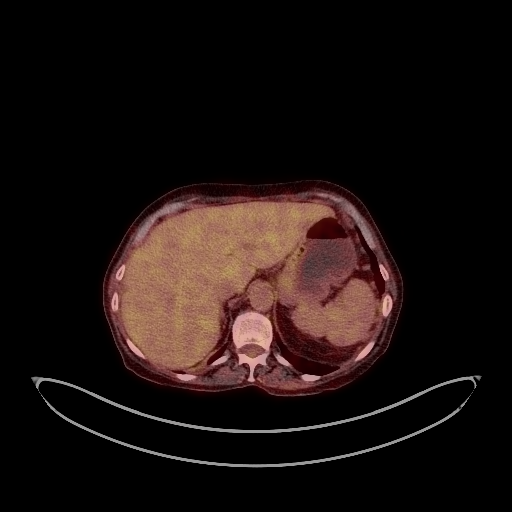
[im 153/204]
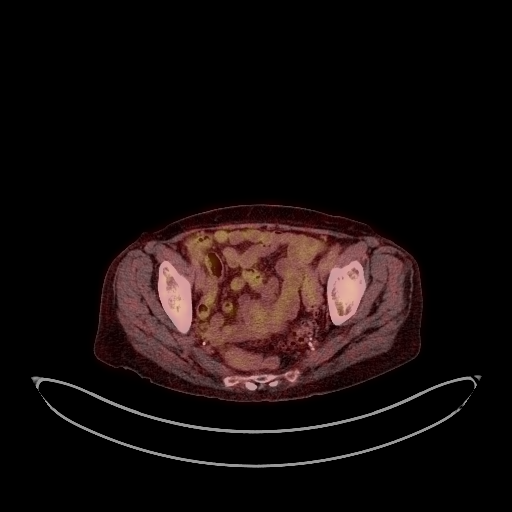
[im 204/204]
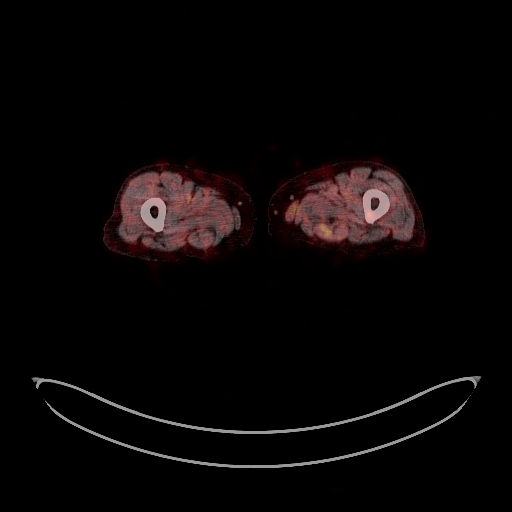

[Series 604: fused cor · 1 of 51 slices shown]
[im 1/51]
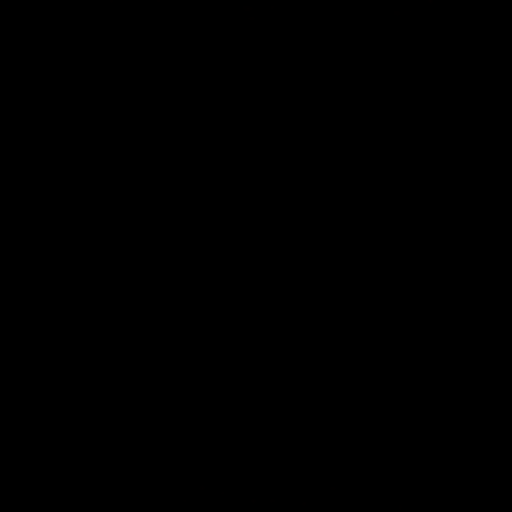

[Series 605: mip pet · coronal · 1.70mm/px · 1 of 32 slices shown]
[im 1/32]
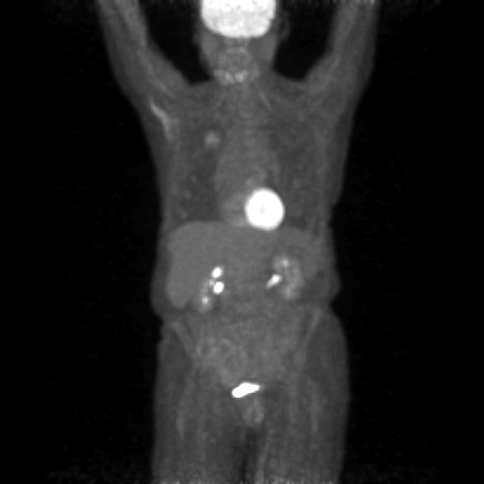

[Series 1098: results mm oncology reading · 1.0mm · 0.84mm/px · 1 of 8 slices shown]
[im 1/8]
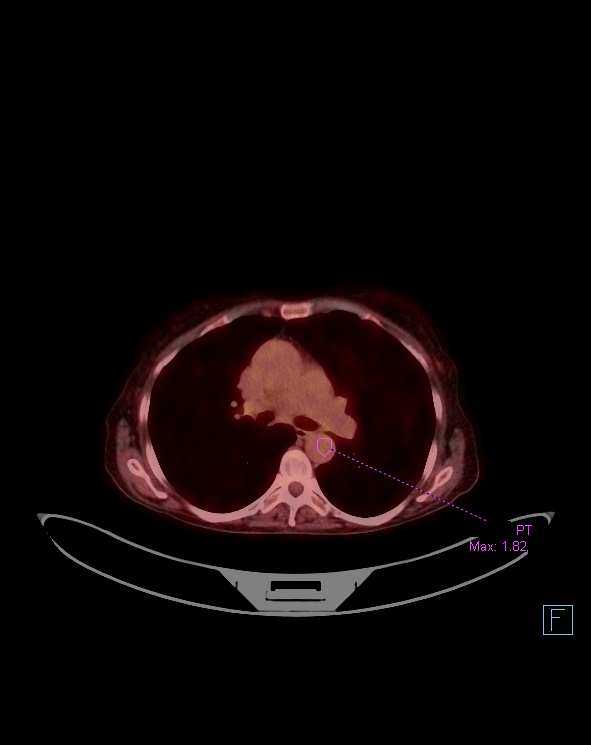

[25 of 25 positions shown; findings below may reference images not displayed]

FINDINGS: Mediastinal blood pool activity: SUV max

NECK:

No hypermetabolic cervical lymph nodes are identified.There are no
lesions of the pharyngeal mucosal space.

Incidental CT findings: Bilateral carotid atherosclerosis.

CHEST:

There are no hypermetabolic mediastinal, hilar or axillary lymph
nodes. There is a new solid density posterolateral to the dominant
part solid nodule posteriorly in the right upper lobe measuring
x 1.4 cm on image [DATE], attributed to hemorrhage from recent biopsy.
The part solid nodule is otherwise similar with low level
hypermetabolic activity (SUV max 3.0). There is only low level
metabolic activity within the additional ground-glass opacities in
the right lung (less than blood pool activity). Area of probable
subpleural scarring (pulmonary infarct) posteriorly in the right
lower lobe has improved from baseline study and demonstrates low
level metabolic activity (SUV max 1.8). No hypermetabolic pulmonary
activity or suspicious nodularity in the left lung. No
hypermetabolic mediastinal, hilar or axillary lymph nodes.

Incidental CT findings: Mild atherosclerosis of the aorta, great
vessels and coronary arteries.

ABDOMEN/PELVIS:

There is no hypermetabolic activity within the liver, adrenal glands
or spleen. Indeterminate ill-defined increased density between the
duodenum and pancreatic head measuring up to 1.1 cm on image 123/4,
with low level hypermetabolic activity (SUV max 2.8). No associated
biliary or pancreatic ductal dilatation. This area was not
previously imaged. There is no hypermetabolic nodal activity
elsewhere.

Incidental CT findings: Aortic and branch vessel atherosclerosis.
Distal colonic diverticulosis.

SKELETON:

There is no hypermetabolic activity to suggest osseous metastatic
disease.

Incidental CT findings: Lumbar spondylosis.
IMPRESSION: 1. The biopsied part solid lesion posteriorly in the right upper
lobe demonstrates low-level hypermetabolic activity consistent with
adenocarcinoma. New adjacent probable hemorrhage related to the
procedure.
2. No significant hypermetabolic activity within the additional
right lung ground-glass opacities. No specific evidence of
metastatic disease.
3. Indeterminate increased periampullary density in the upper
abdomen, not previously imaged and associated with low level
hypermetabolic activity. No associated biliary or pancreatic ductal
dilatation. This could reflect small reactive lymph nodes or
duodenal diverticula. Although the significance of this finding
seems relatively doubtful, this could be further evaluated with
abdominal CT with enteric and intravenous contrast.
4. Evolving right lower lobe pulmonary infarct.
5. Coronary and Aortic Atherosclerosis ([AE]-[AE]).

## 2021-06-25 MED ORDER — FLUDEOXYGLUCOSE F - 18 (FDG) INJECTION
5.0000 | Freq: Once | INTRAVENOUS | Status: AC | PRN
  Administered 2021-06-25: 5.1 via INTRAVENOUS

## 2021-06-29 ENCOUNTER — Encounter: Payer: Self-pay | Admitting: *Deleted

## 2021-06-29 NOTE — Progress Notes (Signed)
Oncology Nurse Navigator Documentation ? ? ?  06/29/2021  ? 10:00 AM 06/22/2021  ? 11:45 AM 06/16/2021  ?  9:49 AM  ?Oncology Nurse Navigator Flowsheets  ?Diagnosis Status  Additional Work Up Confirmed Diagnosis Complete  ?Navigator Follow Up Date: 07/05/2021    ?Navigator Follow Up Reason: Review Note Follow-up Appointment Follow-up Appointment  ?[facility]igator [facility]  ?Navigator Encounter Type Pathology Review    ?Treatment Phase   Follow-up;Abnormal Scans  ?Barriers/Navigation Needs Coordination of Care/I received a message from [employer]. She states she received an email from [facility] that there was not enough material for testing.  Will update Dr. Julien Nordmann.  Coordination of Care;Financial Toxicity Anxiety;Coordination of Care  ?Interventions Coordination of Care Coordination of Care;Education;Psycho-Social Support;Referrals   ?Acuity Level 2-Minimal Needs (1-2 Barriers Identified) Level 2-Minimal Needs (1-2 Barriers Identified)   ?Referrals  Financial Counseling   ?Coordination of Care Pathology Appts   ?Time Spent with Patient 15 90 45  ?  ?

## 2021-07-02 ENCOUNTER — Ambulatory Visit
Admission: RE | Admit: 2021-07-02 | Discharge: 2021-07-02 | Disposition: A | Discharge status: Home / Self Care | Payer: BC Managed Care – PPO | Source: Ambulatory Visit | Attending: Acute Care | Admitting: Acute Care

## 2021-07-02 DIAGNOSIS — C3490 Malignant neoplasm of unspecified part of unspecified bronchus or lung: Secondary | ICD-10-CM | POA: Diagnosis not present

## 2021-07-02 DIAGNOSIS — I6782 Cerebral ischemia: Secondary | ICD-10-CM | POA: Diagnosis not present

## 2021-07-02 IMAGING — MR MR HEAD WO/W CM
14 series · 48 of 48 positions shown · IV contrast (multihance)
Comparison: None Available.

CLINICAL DATA: Malignant neoplasm of unspecified part of
unspecified bronchus or lung (HCC) [8G] ([8G]-CM). Non-small
cell lung cancer (NSCLC), staging.

EXAM:
MRI HEAD WITHOUT AND WITH CONTRAST
TECHNIQUE: Multiplanar, multiecho pulse sequences of the brain and surrounding
structures were obtained without and with intravenous contrast.
CONTRAST:  9mL MULTIHANCE GADOBENATE DIMEGLUMINE 529 MG/ML IV SOLN

[Series 2: T1 · sagittal · 5.0mm · 0.45mm/px · 2 of 23 slices shown]
[im 1/23]
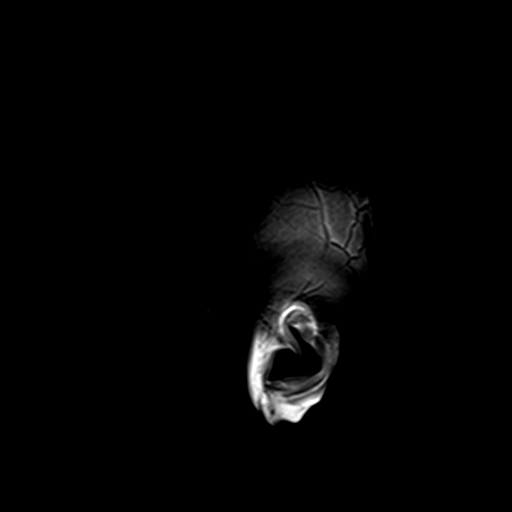
[im 23/23]
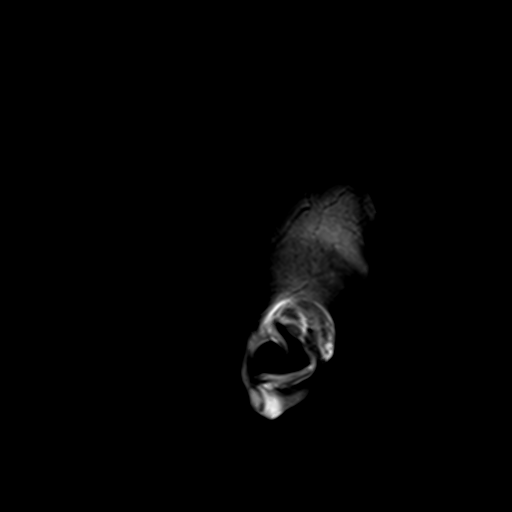

[Series 3: DWI · axial · 3.0mm · 1.80mm/px · z∈[-43,+88]mm · 7 of 95 slices shown (1 of 4)]
[im 1/95]
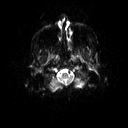
[im 16/95]
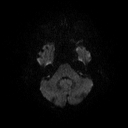
[im 32/95]
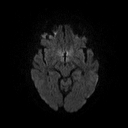
[im 48/95]
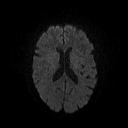
[im 63/95]
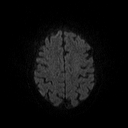
[im 79/95]
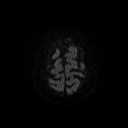
[im 95/95]
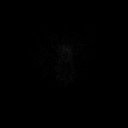

[Series 4: DWI · axial · 3.0mm · 1.80mm/px · z∈[-51,+88]mm · 4 of 49 slices shown (2 of 4)]
[im 1/49]
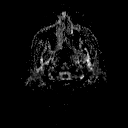
[im 17/49]
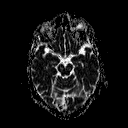
[im 33/49]
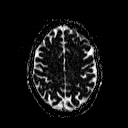
[im 49/49]
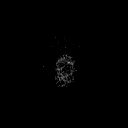

[Series 5: DWI · coronal · 5.0mm · 1.80mm/px · 4 of 70 slices shown (3 of 4)]
[im 1/70]
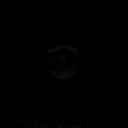
[im 24/70]
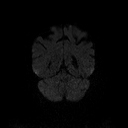
[im 47/70]
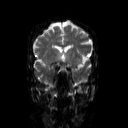
[im 70/70]
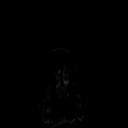

[Series 6: DWI · coronal · 5.0mm · 1.80mm/px · 2 of 35 slices shown (4 of 4)]
[im 1/35]
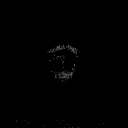
[im 35/35]
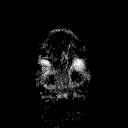

[Series 7: T2 · axial · 5.0mm · 0.72mm/px · 1 of 23 slices shown (1 of 3)]
[im 1/23]
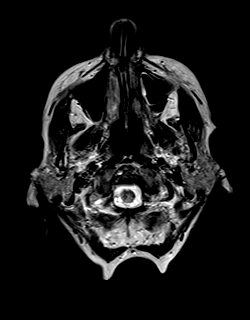

[Series 8: FLAIR · axial · 3.0mm · 0.45mm/px · z∈[-50,+87]mm · 2 of 32 slices shown]
[im 1/32]
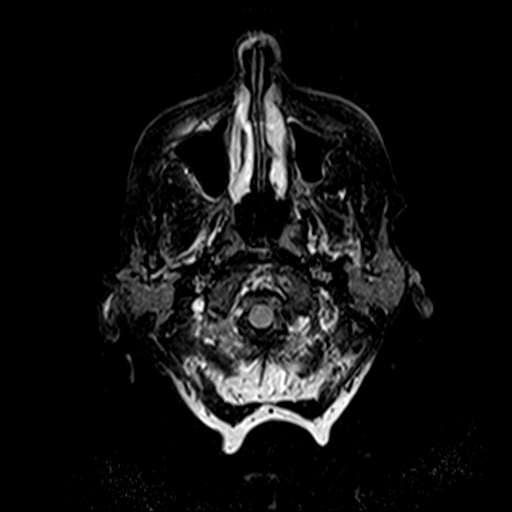
[im 32/32]
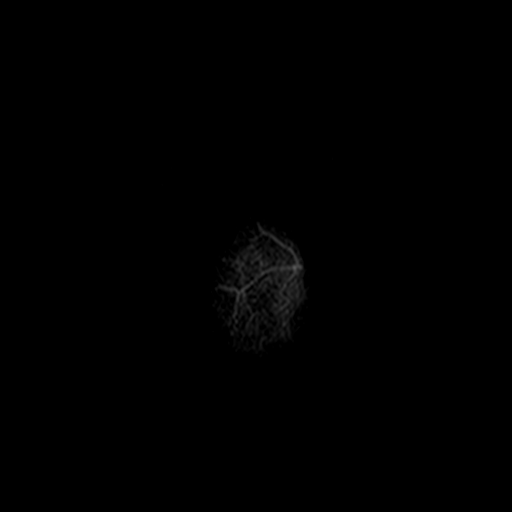

[Series 9: T2 · axial · 5.0mm · 0.72mm/px · 1 of 23 slices shown (2 of 3)]
[im 1/23]
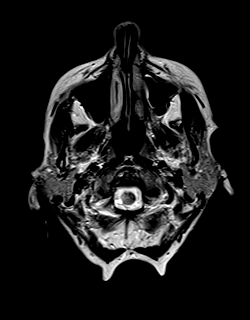

[Series 11: swi_images · axial · 4.0mm · 0.90mm/px · z∈[-48,+85]mm · 2 of 36 slices shown]
[im 1/36]
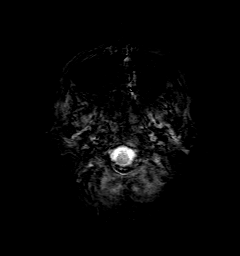
[im 36/36]
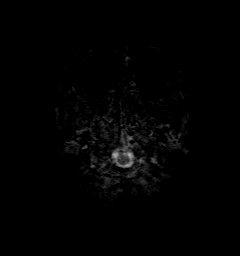

[Series 12: t1_mpr_tra · axial · 1.0mm · 0.75mm/px · z∈[-48,+88]mm · 9 of 144 slices shown]
[im 1/144]
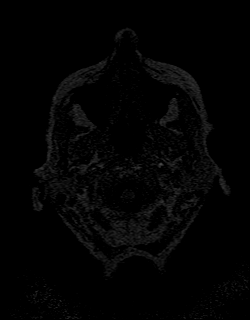
[im 18/144]
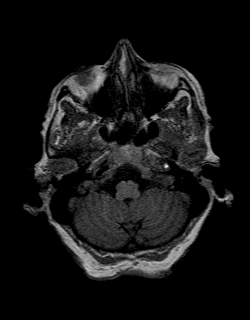
[im 36/144]
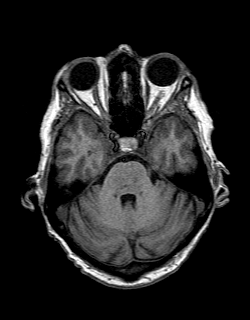
[im 54/144]
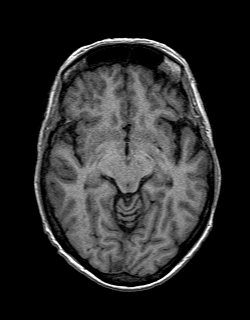
[im 72/144]
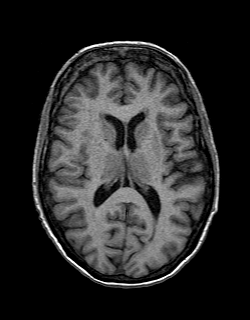
[im 90/144]
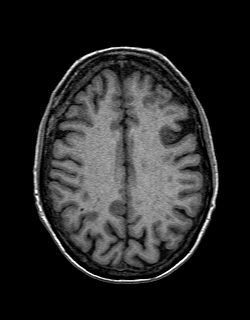
[im 108/144]
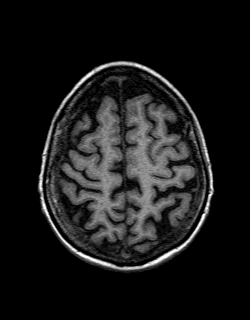
[im 126/144]
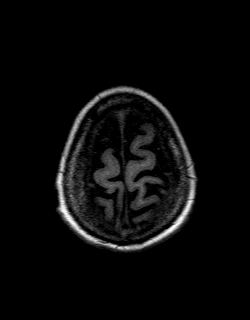
[im 144/144]
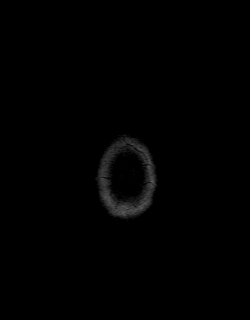

[Series 13: T2 · coronal · 5.0mm · 0.45mm/px · 2 of 27 slices shown (3 of 3)]
[im 1/27]
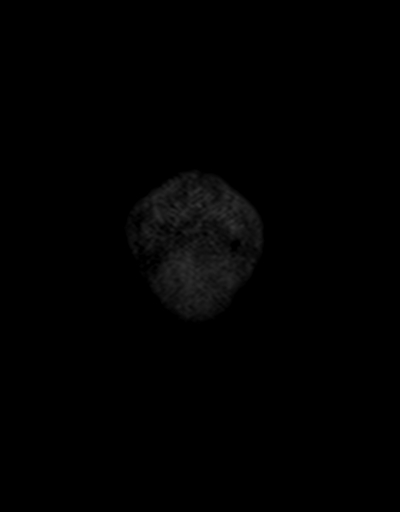
[im 27/27]
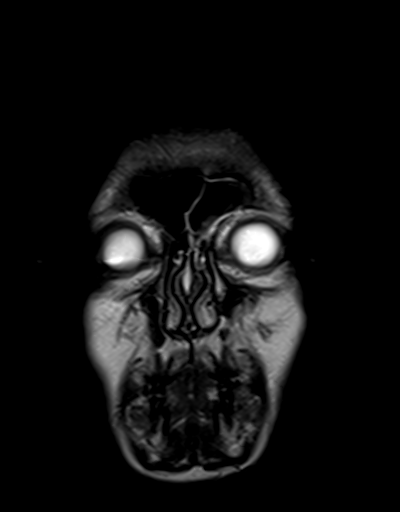

[Series 14: t1_mpr_tra post · axial · 1.0mm · 0.75mm/px · z∈[-48,+88]mm · 9 of 144 slices shown]
[im 1/144]
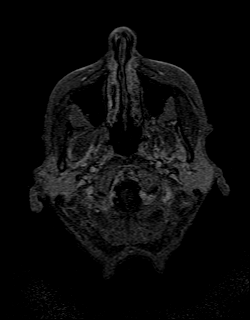
[im 18/144]
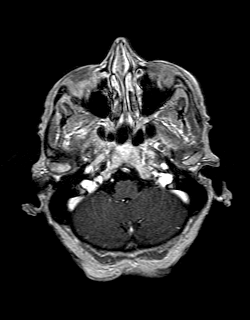
[im 36/144]
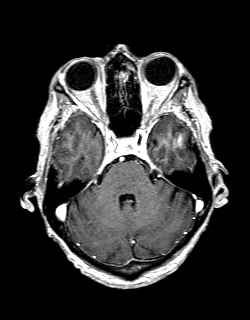
[im 54/144]
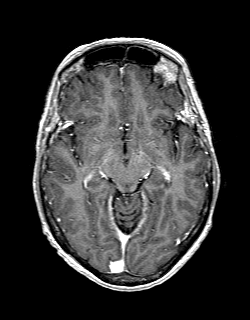
[im 72/144]
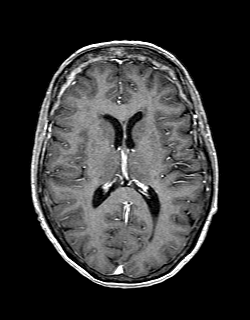
[im 90/144]
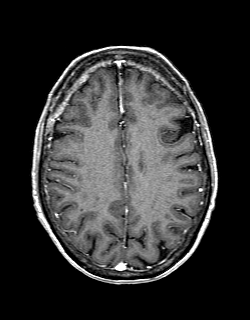
[im 108/144]
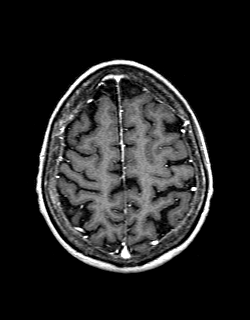
[im 126/144]
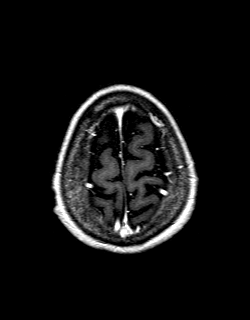
[im 144/144]
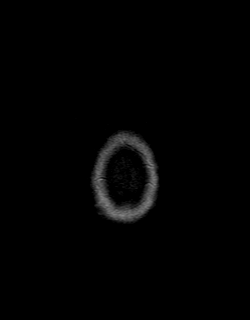

[Series 15: post cor · coronal · 5.0mm · 0.45mm/px · 2 of 27 slices shown]
[im 1/27]
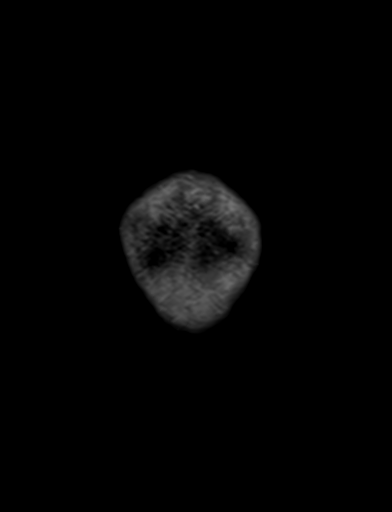
[im 27/27]
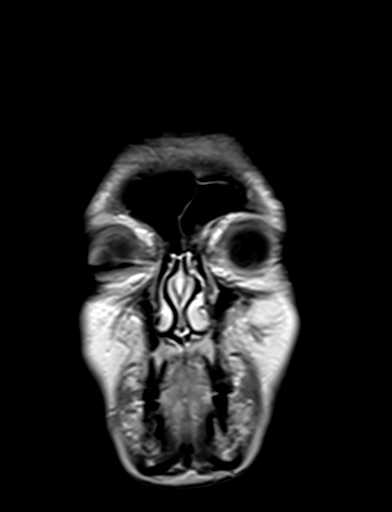

[Series 16: T1 post-contrast · sagittal · 5.0mm · 0.45mm/px · 1 of 23 slices shown]
[im 1/23]
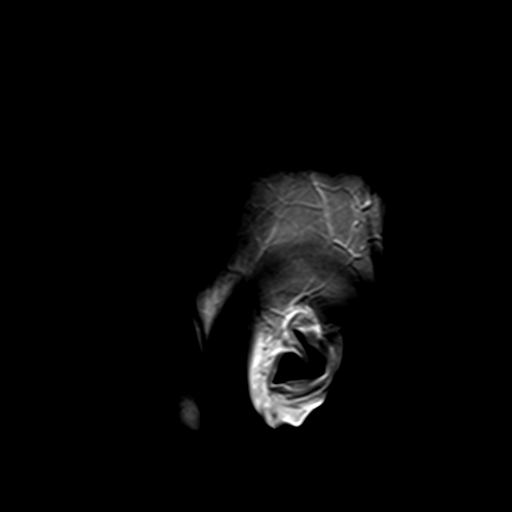

[48 of 48 positions shown; findings below may reference images not displayed]

FINDINGS: Brain: No acute infarction, hemorrhage, hydrocephalus, extra-axial
collection or mass lesion.

Scattered foci of T2 hyperintensity are seen within the white matter
of cerebral hemispheres nonspecific, most likely related to chronic
microangiopathy. No focus of abnormal contrast enhancement
identified.

Vascular: Normal flow voids.

Skull and upper cervical spine: Normal marrow signal.

Sinuses/Orbits: Negative.

Other: None.
IMPRESSION: 1. No focus of abnormal contrast enhancement to suggest intracranial
metastatic disease.
2. Mild chronic microvascular ischemic changes of the white matter.

## 2021-07-02 MED ORDER — GADOBENATE DIMEGLUMINE 529 MG/ML IV SOLN
9.0000 mL | Freq: Once | INTRAVENOUS | Status: AC | PRN
  Administered 2021-07-02: 9 mL via INTRAVENOUS

## 2021-07-05 ENCOUNTER — Other Ambulatory Visit: Payer: Self-pay

## 2021-07-05 DIAGNOSIS — Z720 Tobacco use: Secondary | ICD-10-CM

## 2021-07-05 DIAGNOSIS — R918 Other nonspecific abnormal finding of lung field: Secondary | ICD-10-CM

## 2021-07-06 ENCOUNTER — Inpatient Hospital Stay: Payer: BC Managed Care – PPO

## 2021-07-06 ENCOUNTER — Encounter: Payer: Self-pay | Admitting: Internal Medicine

## 2021-07-06 ENCOUNTER — Other Ambulatory Visit: Payer: Self-pay

## 2021-07-06 ENCOUNTER — Inpatient Hospital Stay (HOSPITAL_BASED_OUTPATIENT_CLINIC_OR_DEPARTMENT_OTHER): Payer: BC Managed Care – PPO | Admitting: Internal Medicine

## 2021-07-06 VITALS — BP 150/79 | HR 72 | Temp 97.9°F | Resp 19 | Wt 102.4 lb

## 2021-07-06 DIAGNOSIS — F17210 Nicotine dependence, cigarettes, uncomplicated: Secondary | ICD-10-CM | POA: Diagnosis not present

## 2021-07-06 DIAGNOSIS — R110 Nausea: Secondary | ICD-10-CM | POA: Diagnosis not present

## 2021-07-06 DIAGNOSIS — Z720 Tobacco use: Secondary | ICD-10-CM

## 2021-07-06 DIAGNOSIS — C3491 Malignant neoplasm of unspecified part of right bronchus or lung: Secondary | ICD-10-CM | POA: Diagnosis not present

## 2021-07-06 DIAGNOSIS — R918 Other nonspecific abnormal finding of lung field: Secondary | ICD-10-CM

## 2021-07-06 DIAGNOSIS — Z7901 Long term (current) use of anticoagulants: Secondary | ICD-10-CM | POA: Diagnosis not present

## 2021-07-06 DIAGNOSIS — R197 Diarrhea, unspecified: Secondary | ICD-10-CM | POA: Diagnosis not present

## 2021-07-06 DIAGNOSIS — R059 Cough, unspecified: Secondary | ICD-10-CM | POA: Diagnosis not present

## 2021-07-06 DIAGNOSIS — R5383 Other fatigue: Secondary | ICD-10-CM | POA: Diagnosis not present

## 2021-07-06 DIAGNOSIS — Z79899 Other long term (current) drug therapy: Secondary | ICD-10-CM | POA: Diagnosis not present

## 2021-07-06 DIAGNOSIS — E785 Hyperlipidemia, unspecified: Secondary | ICD-10-CM | POA: Diagnosis not present

## 2021-07-06 DIAGNOSIS — Z5111 Encounter for antineoplastic chemotherapy: Secondary | ICD-10-CM | POA: Insufficient documentation

## 2021-07-06 DIAGNOSIS — F1290 Cannabis use, unspecified, uncomplicated: Secondary | ICD-10-CM | POA: Diagnosis not present

## 2021-07-06 DIAGNOSIS — I82401 Acute embolism and thrombosis of unspecified deep veins of right lower extremity: Secondary | ICD-10-CM | POA: Diagnosis not present

## 2021-07-06 DIAGNOSIS — F32A Depression, unspecified: Secondary | ICD-10-CM | POA: Diagnosis not present

## 2021-07-06 DIAGNOSIS — Z87440 Personal history of urinary (tract) infections: Secondary | ICD-10-CM | POA: Diagnosis not present

## 2021-07-06 DIAGNOSIS — I00 Rheumatic fever without heart involvement: Secondary | ICD-10-CM | POA: Diagnosis not present

## 2021-07-06 DIAGNOSIS — I2699 Other pulmonary embolism without acute cor pulmonale: Secondary | ICD-10-CM | POA: Diagnosis not present

## 2021-07-06 LAB — CBC WITH DIFFERENTIAL (CANCER CENTER ONLY)
Abs Immature Granulocytes: 0.03 10*3/uL (ref 0.00–0.07)
Basophils Absolute: 0.1 10*3/uL (ref 0.0–0.1)
Basophils Relative: 1 %
Eosinophils Absolute: 0.3 10*3/uL (ref 0.0–0.5)
Eosinophils Relative: 3 %
HCT: 53.3 % — ABNORMAL HIGH (ref 36.0–46.0)
Hemoglobin: 18.8 g/dL — ABNORMAL HIGH (ref 12.0–15.0)
Immature Granulocytes: 0 %
Lymphocytes Relative: 20 %
Lymphs Abs: 1.8 10*3/uL (ref 0.7–4.0)
MCH: 36.2 pg — ABNORMAL HIGH (ref 26.0–34.0)
MCHC: 35.3 g/dL (ref 30.0–36.0)
MCV: 102.7 fL — ABNORMAL HIGH (ref 80.0–100.0)
Monocytes Absolute: 0.8 10*3/uL (ref 0.1–1.0)
Monocytes Relative: 8 %
Neutro Abs: 6.2 10*3/uL (ref 1.7–7.7)
Neutrophils Relative %: 68 %
Platelet Count: 203 10*3/uL (ref 150–400)
RBC: 5.19 MIL/uL — ABNORMAL HIGH (ref 3.87–5.11)
RDW: 15.8 % — ABNORMAL HIGH (ref 11.5–15.5)
WBC Count: 9.1 10*3/uL (ref 4.0–10.5)
nRBC: 0 % (ref 0.0–0.2)

## 2021-07-06 LAB — CMP (CANCER CENTER ONLY)
ALT: 7 U/L (ref 0–44)
AST: 16 U/L (ref 15–41)
Albumin: 3.7 g/dL (ref 3.5–5.0)
Alkaline Phosphatase: 124 U/L (ref 38–126)
Anion gap: 6 (ref 5–15)
BUN: 6 mg/dL — ABNORMAL LOW (ref 8–23)
CO2: 34 mmol/L — ABNORMAL HIGH (ref 22–32)
Calcium: 9.1 mg/dL (ref 8.9–10.3)
Chloride: 101 mmol/L (ref 98–111)
Creatinine: 0.66 mg/dL (ref 0.44–1.00)
GFR, Estimated: >60 mL/min (ref >60)
Glucose, Bld: 96 mg/dL (ref 70–99)
Potassium: 3.3 mmol/L — ABNORMAL LOW (ref 3.5–5.1)
Sodium: 141 mmol/L (ref 135–145)
Total Bilirubin: 1.2 mg/dL (ref 0.3–1.2)
Total Protein: 6.8 g/dL (ref 6.5–8.1)

## 2021-07-06 MED ORDER — MIRTAZAPINE 15 MG PO TABS: 30.0000 mg | ORAL_TABLET | Freq: Every day | ORAL | 2 refills | Status: DC

## 2021-07-06 NOTE — Patient Instructions (Signed)
Steps to Quit Smoking Smoking tobacco is the leading cause of preventable death. It can affect almost every organ in the body. Smoking puts you and people around you at risk for many serious, long-lasting (chronic) diseases. Quitting smoking can be hard, but it is one of the best things that you can do for your health. It is never too late to quit. Do not give up if you cannot quit the first time. Some people need to try many times to quit. Do your best to stick to your quit plan, and talk with your doctor if you have any questions or concerns. How do I get ready to quit? Pick a date to quit. Set a date within the next 2 weeks to give you time to prepare. Write down the reasons why you are quitting. Keep this list in places where you will see it often. Tell your family, friends, and co-workers that you are quitting. Their support is important. Talk with your doctor about the choices that may help you quit. Find out if your health insurance will pay for these treatments. Know the people, places, things, and activities that make you want to smoke (triggers). Avoid them. What first steps can I take to quit smoking? Throw away all cigarettes at home, at work, and in your car. Throw away the things that you use when you smoke, such as ashtrays and lighters. Clean your car. Empty the ashtray. Clean your home, including curtains and carpets. What can I do to help me quit smoking? Talk with your doctor about taking medicines and seeing a counselor. You are more likely to succeed when you do both. If you are pregnant or breastfeeding: Talk with your doctor about counseling or other ways to quit smoking. Do not take medicine to help you quit smoking unless your doctor tells you to. Quit right away Quit smoking completely, instead of slowly cutting back on how much you smoke over a period of time. Stopping smoking right away may be more successful than slowly quitting. Go to counseling. In-person is best  if this is an option. You are more likely to quit if you go to counseling sessions regularly. Take medicine You may take medicines to help you quit. Some medicines need a prescription, and some you can buy over-the-counter. Some medicines may contain a drug called nicotine to replace the nicotine in cigarettes. Medicines may: Help you stop having the desire to smoke (cravings). Help to stop the problems that come when you stop smoking (withdrawal symptoms). Your doctor may ask you to use: Nicotine patches, gum, or lozenges. Nicotine inhalers or sprays. Non-nicotine medicine that you take by mouth. Find resources Find resources and other ways to help you quit smoking and remain smoke-free after you quit. They include: Online chats with a counselor. Phone quitlines. Printed self-help materials. Support groups or group counseling. Text messaging programs. Mobile phone apps. Use apps on your mobile phone or tablet that can help you stick to your quit plan. Examples of free services include Quit Guide from the CDC and smokefree.gov  What can I do to make it easier to quit?  Talk to your family and friends. Ask them to support and encourage you. Call a phone quitline, such as 1-800-QUIT-NOW, reach out to support groups, or work with a counselor. Ask people who smoke to not smoke around you. Avoid places that make you want to smoke, such as: Bars. Parties. Smoke-break areas at work. Spend time with people who do not smoke. Lower   the stress in your life. Stress can make you want to smoke. Try these things to lower stress: Getting regular exercise. Doing deep-breathing exercises. Doing yoga. Meditating. What benefits will I see if I quit smoking? Over time, you may have: A better sense of smell and taste. Less coughing and sore throat. A slower heart rate. Lower blood pressure. Clearer skin. Better breathing. Fewer sick days. Summary Quitting smoking can be hard, but it is one of  the best things that you can do for your health. Do not give up if you cannot quit the first time. Some people need to try many times to quit. When you decide to quit smoking, make a plan to help you succeed. Quit smoking right away, not slowly over a period of time. When you start quitting, get help and support to keep you smoke-free. This information is not intended to replace advice given to you by your health care provider. Make sure you discuss any questions you have with your health care provider. Document Revised: 01/29/2021 Document Reviewed: 01/29/2021 Elsevier Patient Education  2023 Elsevier Inc.  

## 2021-07-06 NOTE — Progress Notes (Signed)
? ? [facility] ?[phone]   [phone] ? ?CONSULT NOTE ? ?REFERRING PHYSICIAN: Dr. Leory Plowman Icard ? ?REASON FOR CONSULTATION:  ?66 years old white female recently diagnosed with lung cancer. ? ?HPI ?Sue Chase is a 66 y.o. female with past medical history significant for dyslipidemia, rheumatic fever, colon polyps as well as history of deep venous thrombosis of the right lower extremity with bilateral pulmonary embolism.  The patient mentions that her husband was sick in the hospital in December 2022 and she has been visiting him at regular basis and noticed swelling and pain in the right lower extremity.  She ignored it for a while but when she complained to her primary care physician about it ultrasound Doppler of the lower extremities showed evidence for right lower extremity deep venous thrombosis.  The patient had CT angiogram of the chest on February 18, 2021 and it showed bilateral pulmonary embolism but there was also scattered clusters of groundglass opacities.  She was referred to Dr. Valeta Harms and CT super D of the chest on May 10, 2021 showed part solid nodule of the right upper lobe with retraction of the station fissure measuring up to 3.7 x 2.6 cm in overall diameter that was unchanged compared to the prior exam.  The associated solid component measured up to 1.1 cm.  There was additional bilateral groundglass nodules which are unchanged in size compared to the previous exam with a reference right lower lobe groundglass nodule measuring 2.0 x 1.2 cm.  There was decrease in the right lower lobe consolidation with residual linear opacity likely resolving pulmonary infarct.  There was also small stable bilateral solid pulmonary nodules with a reference solid nodule of the left upper lobe measuring 0.3 cm.  On June 08, 2021 the patient underwent video bronchoscopy with robotic assisted bronchoscopic navigation and biopsy of the left upper lobe lung partially  solid mass. ?The final cytology ([id number]) showed malignant cells, adenocarcinoma with tumor fragments in the cellblock for lipidic pattern, nonmucinous.  Blood testing tissue biopsy was sent to [place] for molecular studies.  The blood test fails because of insufficient circulating free tumor DNA.  The PD-L1 expression was 4% and the molecular studies on the tissue are still pending. ?The patient had a PET scan on 06/25/2021 and it showed the biopsied part solid lesion posterior in the right upper lobe demonstrate low-level hypermetabolic activity consistent with adenocarcinoma.  There was no significant hypermetabolic activity within the additional right lung groundglass opacity and no specific evidence of metastatic disease.  The scan also showed indeterminate increase periampullary density in the upper abdomen not previously imaged and associated with low-level hypermetabolic activity with no associated biliary or pancreatic ductal dilatation.  This could reflect a small reactive lymph node or duodenal diverticula.  The patient also had MRI of the brain on 07/02/2021 and it was negative for metastatic disease to the brain. ?Dr. Valeta Harms kindly referred the patient to me today for evaluation and recommendation regarding her condition. ?When seen today she is feeling fine except for mild cough.  She has no chest pain, shortness of breath or hemoptysis.  She has occasional nausea and diarrhea.  Her weight is stable she is still coping with loss of her husband.  She denied having any headache or visual changes. ?Family history significant for mother with diabetes mellitus, heart disease, hypertension and dyslipidemia.  Father has lung and liver cancer.  Brother had diabetes. ?The patient is a widow and has 2 daughters.  She was accompanied by her daughter Sue Chase today and she has another daughter Sue Chase who lives in [place].  She used to do administrative work.  She has a history of smoking 1 pack/day for around  49 years and unfortunately continues to smoke.  She also has 1 alcoholic drink every night.  She smokes marijuana.  She has no other history of drug abuse. ? ?HPI ? ?Past Medical History:  ?Diagnosis Date  ? Colon polyps   ? Hyperlipidemia   ? Rheumatic fever   ? Seasonal allergies   ? UTI (urinary tract infection)   ? ? ?Past Surgical History:  ?Procedure Laterality Date  ? BRONCHIAL BIOPSY  06/08/2021  ? Procedure: BRONCHIAL BIOPSIES;  Surgeon: Garner Nash, DO;  Location: [facility];  Service: Pulmonary;;  ? BRONCHIAL BRUSHINGS  06/08/2021  ? Procedure: BRONCHIAL BRUSHINGS;  Surgeon: Garner Nash, DO;  Location: [facility];  Service: Pulmonary;;  ? BRONCHIAL NEEDLE ASPIRATION BIOPSY  06/08/2021  ? Procedure: BRONCHIAL NEEDLE ASPIRATION BIOPSIES;  Surgeon: Garner Nash, DO;  Location: [facility];  Service: Pulmonary;;  ? COLONOSCOPY  2018  ? SALIVARY GLAND SURGERY    ? LATE 80S  ? TUBAL LIGATION  1986  ? VIDEO BRONCHOSCOPY WITH RADIAL ENDOBRONCHIAL ULTRASOUND  06/08/2021  ? Procedure: VIDEO BRONCHOSCOPY WITH RADIAL ENDOBRONCHIAL ULTRASOUND;  Surgeon: Garner Nash, DO;  Location: [facility];  Service: Pulmonary;;  ? ? ?Family History  ?Problem Relation Age of Onset  ? Arthritis Mother   ? Diabetes Mother   ? Heart disease Mother   ? Hyperlipidemia Mother   ? Hypertension Mother   ? Miscarriages / [place] Mother   ? Hearing loss Father   ? Liver cancer Father   ? Lung cancer Father   ? Hyperlipidemia Father   ? Diabetes Brother   ? Lung disease Maternal Grandfather   ?     Black Lung  ? Diabetes Paternal Grandmother   ? Cancer Paternal Grandfather   ? Diabetes Paternal Grandfather   ? ? ?Social History ?Social History  ? ?Tobacco Use  ? Smoking status: Some Days  ?  Packs/day: 1.00  ?  Years: 40.00  ?  Pack years: 40.00  ?  Types: Cigarettes  ?  Passive exposure: Never  ? Smokeless tobacco: Never  ? Tobacco comments:  ?  1/2 ppd or less 06/15/21. Hsm/.   ?Vaping Use  ? Vaping Use: Never used   ?Substance Use Topics  ? Alcohol use: Yes  ?  Comment: Rum and Coke/Unsure of amount  ? Drug use: Never  ? ? ?Allergies  ?Allergen Reactions  ? Codeine Itching  ? ? ?Current Outpatient Medications  ?Medication Sig Dispense Refill  ? albuterol (VENTOLIN HFA) 108 (90 Base) MCG/ACT inhaler Inhale 2 puffs into the lungs every 6 (six) hours as needed for wheezing or shortness of breath. 8 g 1  ? apixaban (ELIQUIS) 5 MG TABS tablet Take 1 tablet (5 mg total) by mouth 2 (two) times daily. 180 tablet 1  ? nicotine (NICODERM CQ - DOSED IN MG/24 HOURS) 14 mg/24hr patch Place 1 patch (14 mg total) onto the skin daily. (Patient not taking: Reported on 06/22/2021) 28 patch 0  ? omeprazole (PRILOSEC) 20 MG capsule Take 1 capsule (20 mg total) by mouth daily. (Patient taking differently: Take 20 mg by mouth as needed.) 30 capsule 3  ? potassium chloride SA (KLOR-CON M) 20 MEQ tablet Take 1 tablet (20 mEq total) by mouth daily. (Patient not  taking: Reported on 06/22/2021) 90 tablet 3  ? ?No current facility-administered medications for this visit.  ? ? ?Review of Systems ? ?Constitutional: positive for fatigue ?Eyes: negative ?Ears, nose, mouth, throat, and face: negative ?Respiratory: positive for cough ?Cardiovascular: negative ?Gastrointestinal: positive for diarrhea and nausea ?Genitourinary:negative ?Integument/breast: negative ?Hematologic/lymphatic: negative ?Musculoskeletal:negative ?Neurological: negative ?Behavioral/Psych: positive for depression ?Endocrine: negative ?Allergic/Immunologic: negative ? ?Physical Exam ? ?HKF:EXMDY, healthy, no distress, well nourished, and well developed ?SKIN: skin color, texture, turgor are normal, no rashes or significant lesions ?HEAD: Normocephalic, No masses, lesions, tenderness or abnormalities ?EYES: normal, PERRLA, Conjunctiva are pink and non-injected ?EARS: External ears normal, Canals clear ?OROPHARYNX:no exudate, no erythema, and lips, buccal mucosa, and tongue normal  ?NECK:  supple, no adenopathy, no JVD ?LYMPH:  no palpable lymphadenopathy, no hepatosplenomegaly ?BREAST:not examined ?LUNGS: clear to auscultation , and palpation ?HEART: regular rate & rhythm, no murmurs, and no

## 2021-07-07 ENCOUNTER — Ambulatory Visit: Payer: BC Managed Care – PPO | Admitting: Oncology

## 2021-07-10 DIAGNOSIS — C3490 Malignant neoplasm of unspecified part of unspecified bronchus or lung: Secondary | ICD-10-CM | POA: Diagnosis not present

## 2021-07-12 ENCOUNTER — Telehealth: Payer: Self-pay | Admitting: Medical Oncology

## 2021-07-12 ENCOUNTER — Encounter: Payer: Self-pay | Admitting: *Deleted

## 2021-07-12 NOTE — Progress Notes (Signed)
Oncology Nurse Navigator Documentation     07/12/2021    2:00 PM 06/29/2021   10:00 AM 06/22/2021   11:45 AM 06/16/2021    9:49 AM  Oncology Nurse Navigator Flowsheets  Diagnosis Status   Additional Work Up Confirmed Diagnosis Complete  Navigator Follow Up Date:  07/05/2021    Navigator Follow Up Reason:  Review Note Follow-up Appointment Follow-up Appointment  Navigator Location [facility]  Navigator Encounter Type Telephone Pathology Review    Telephone Outgoing Call     Treatment Phase    Follow-up;Abnormal Scans  Barriers/Navigation Needs Education;Coordination of Care/I received a message that Ms. Nierman would like to see Dr. Julien Nordmann sooner than 6/20.  Dr. Julien Nordmann was updated and ok to see her sooner. I called to schedule her but was unable to reach. I was also unable to leave vm message.  Coordination of Care Coordination of Care;Financial Toxicity Anxiety;Coordination of Care  Education Other     Interventions Coordination of Care;Education Coordination of Care Coordination of Care;Education;Psycho-Social Support;Referrals   Acuity Level 2-Minimal Needs (1-2 Barriers Identified) Level 2-Minimal Needs (1-2 Barriers Identified) Level 2-Minimal Needs (1-2 Barriers Identified)   Referrals   Financial Counseling   Coordination of Care  Pathology Appts   Time Spent with Patient 15 15 90 45

## 2021-07-12 NOTE — Telephone Encounter (Signed)
"  Keytruda study"- She received a message on sat re: a study for Keytruda. She did not finish reading it because she had to go somewhere. When she came back to the finish reading the message , it was gone.  Asking for molecular results and does she need to come in before 6/20. She was crying as she gave me her phone numbers.

## 2021-07-13 ENCOUNTER — Encounter: Payer: Self-pay | Admitting: *Deleted

## 2021-07-13 NOTE — Progress Notes (Signed)
Oncology Nurse Navigator Documentation     07/13/2021    8:00 AM 07/12/2021    2:00 PM 06/29/2021   10:00 AM 06/22/2021   11:45 AM 06/16/2021    9:49 AM  Oncology Nurse Navigator Flowsheets  Diagnosis Status Confirmed Diagnosis Complete   Additional Work Up Confirmed Diagnosis Complete  Navigator Follow Up Date: 08/03/2021  07/05/2021    Navigator Follow Up Reason: Follow-up Appointment  Review Note Follow-up Appointment Follow-up Appointment  Navigator [facility]  Navigator Encounter Type Telephone Telephone Pathology Review    Telephone Outgoing Call Outgoing Call     Treatment Phase Pre-Tx/Tx Discussion    Follow-up;Abnormal Scans  Barriers/Navigation Needs Coordination of Care;Education/I received a message from Dr. Julien Nordmann and Diane RN to call Ms. Schertzer. I called. I clarified questions and concerns. Her appt is now on 6/13 with Dr. Julien Nordmann to go over plan of care. She was thankful for the call.  Education;Coordination of Care Coordination of Care Coordination of Care;Financial Toxicity Anxiety;Coordination of Care  Education Other Other     Interventions Education;Psycho-Social Support;Coordination of Care Coordination of Care;Education Coordination of Care Coordination of Care;Education;Psycho-Social Support;Referrals   Acuity Level 2-Minimal Needs (1-2 Barriers Identified) Level 2-Minimal Needs (1-2 Barriers Identified) Level 2-Minimal Needs (1-2 Barriers Identified) Level 2-Minimal Needs (1-2 Barriers Identified)   Referrals    Financial Counseling   Coordination of Care Other  Pathology Appts   Education Method Verbal      Time Spent with Patient 30 15 15  90 45

## 2021-07-15 ENCOUNTER — Encounter (HOSPITAL_COMMUNITY): Payer: Self-pay

## 2021-08-03 ENCOUNTER — Other Ambulatory Visit: Payer: Self-pay

## 2021-08-03 ENCOUNTER — Telehealth: Payer: Self-pay | Admitting: Medical Oncology

## 2021-08-03 ENCOUNTER — Other Ambulatory Visit: Payer: Self-pay | Admitting: Lab

## 2021-08-03 ENCOUNTER — Encounter: Payer: Self-pay | Admitting: Internal Medicine

## 2021-08-03 ENCOUNTER — Inpatient Hospital Stay: Payer: BC Managed Care – PPO | Attending: Oncology | Admitting: Internal Medicine

## 2021-08-03 ENCOUNTER — Inpatient Hospital Stay: Payer: BC Managed Care – PPO

## 2021-08-03 VITALS — BP 153/79 | HR 77 | Temp 97.5°F | Resp 18 | Wt 100.5 lb

## 2021-08-03 DIAGNOSIS — Z5111 Encounter for antineoplastic chemotherapy: Secondary | ICD-10-CM | POA: Diagnosis not present

## 2021-08-03 DIAGNOSIS — K5900 Constipation, unspecified: Secondary | ICD-10-CM | POA: Diagnosis not present

## 2021-08-03 DIAGNOSIS — E538 Deficiency of other specified B group vitamins: Secondary | ICD-10-CM | POA: Insufficient documentation

## 2021-08-03 DIAGNOSIS — C3411 Malignant neoplasm of upper lobe, right bronchus or lung: Secondary | ICD-10-CM | POA: Insufficient documentation

## 2021-08-03 DIAGNOSIS — Z79899 Other long term (current) drug therapy: Secondary | ICD-10-CM | POA: Diagnosis not present

## 2021-08-03 DIAGNOSIS — Z5112 Encounter for antineoplastic immunotherapy: Secondary | ICD-10-CM | POA: Insufficient documentation

## 2021-08-03 DIAGNOSIS — C3491 Malignant neoplasm of unspecified part of right bronchus or lung: Secondary | ICD-10-CM | POA: Diagnosis not present

## 2021-08-03 DIAGNOSIS — E876 Hypokalemia: Secondary | ICD-10-CM | POA: Insufficient documentation

## 2021-08-03 DIAGNOSIS — R140 Abdominal distension (gaseous): Secondary | ICD-10-CM | POA: Insufficient documentation

## 2021-08-03 DIAGNOSIS — R197 Diarrhea, unspecified: Secondary | ICD-10-CM | POA: Insufficient documentation

## 2021-08-03 DIAGNOSIS — R5383 Other fatigue: Secondary | ICD-10-CM | POA: Diagnosis not present

## 2021-08-03 DIAGNOSIS — R531 Weakness: Secondary | ICD-10-CM | POA: Insufficient documentation

## 2021-08-03 LAB — CBC WITH DIFFERENTIAL (CANCER CENTER ONLY)
Abs Immature Granulocytes: 0.04 10*3/uL (ref 0.00–0.07)
Basophils Absolute: 0.1 10*3/uL (ref 0.0–0.1)
Basophils Relative: 1 %
Eosinophils Absolute: 0.1 10*3/uL (ref 0.0–0.5)
Eosinophils Relative: 1 %
HCT: 52.6 % — ABNORMAL HIGH (ref 36.0–46.0)
Hemoglobin: 18.4 g/dL — ABNORMAL HIGH (ref 12.0–15.0)
Immature Granulocytes: 0 %
Lymphocytes Relative: 14 %
Lymphs Abs: 1.8 10*3/uL (ref 0.7–4.0)
MCH: 36.2 pg — ABNORMAL HIGH (ref 26.0–34.0)
MCHC: 35 g/dL (ref 30.0–36.0)
MCV: 103.5 fL — ABNORMAL HIGH (ref 80.0–100.0)
Monocytes Absolute: 0.9 10*3/uL (ref 0.1–1.0)
Monocytes Relative: 7 %
Neutro Abs: 9.3 10*3/uL — ABNORMAL HIGH (ref 1.7–7.7)
Neutrophils Relative %: 77 %
Platelet Count: 300 10*3/uL (ref 150–400)
RBC: 5.08 MIL/uL (ref 3.87–5.11)
RDW: 16.9 % — ABNORMAL HIGH (ref 11.5–15.5)
WBC Count: 12.2 10*3/uL — ABNORMAL HIGH (ref 4.0–10.5)
nRBC: 0 % (ref 0.0–0.2)

## 2021-08-03 LAB — CMP (CANCER CENTER ONLY)
ALT: 13 U/L (ref 0–44)
AST: 12 U/L — ABNORMAL LOW (ref 15–41)
Albumin: 3.6 g/dL (ref 3.5–5.0)
Alkaline Phosphatase: 240 U/L — ABNORMAL HIGH (ref 38–126)
Anion gap: 7 (ref 5–15)
BUN: 10 mg/dL (ref 8–23)
CO2: 33 mmol/L — ABNORMAL HIGH (ref 22–32)
Calcium: 10 mg/dL (ref 8.9–10.3)
Chloride: 99 mmol/L (ref 98–111)
Creatinine: 0.57 mg/dL (ref 0.44–1.00)
GFR, Estimated: >60 mL/min (ref >60)
Glucose, Bld: 94 mg/dL (ref 70–99)
Potassium: 3 mmol/L — ABNORMAL LOW (ref 3.5–5.1)
Sodium: 139 mmol/L (ref 135–145)
Total Bilirubin: 1.1 mg/dL (ref 0.3–1.2)
Total Protein: 7.1 g/dL (ref 6.5–8.1)

## 2021-08-03 MED ORDER — PROCHLORPERAZINE MALEATE 10 MG PO TABS: 10.0000 mg | ORAL_TABLET | Freq: Four times a day (QID) | ORAL | 0 refills | Status: DC | PRN

## 2021-08-03 MED ORDER — LIDOCAINE-PRILOCAINE 2.5-2.5 % EX CREA
TOPICAL_CREAM | CUTANEOUS | 0 refills | Status: DC
Start: 2021-08-03 — End: 2023-05-29

## 2021-08-03 MED ORDER — POTASSIUM CHLORIDE CRYS ER 20 MEQ PO TBCR: 20.0000 meq | EXTENDED_RELEASE_TABLET | Freq: Every day | ORAL | 0 refills | Status: DC

## 2021-08-03 MED ORDER — FOLIC ACID 1 MG PO TABS: 1.0000 mg | ORAL_TABLET | Freq: Every day | ORAL | 4 refills | Status: DC

## 2021-08-03 MED ORDER — CYANOCOBALAMIN 1000 MCG/ML IJ SOLN
1000.0000 ug | Freq: Once | INTRAMUSCULAR | Status: AC
  Administered 2021-08-03: 1000 ug via INTRAMUSCULAR
  Filled 2021-08-03: qty 1

## 2021-08-03 NOTE — Telephone Encounter (Signed)
Pt asking about financial program for non chemo meds.  I will forward message to [facility] or [place].

## 2021-08-03 NOTE — Progress Notes (Signed)
START ON PATHWAY REGIMEN - Non-Small Cell Lung     A cycle is every 21 days:     Pembrolizumab      Pemetrexed      Carboplatin   **Always confirm dose/schedule in your pharmacy ordering system**  Patient Characteristics: Stage IV Metastatic, Nonsquamous, Molecular Analysis Completed, Molecular Alteration Present and Targeted Therapy Exhausted OR EGFR Exon 20+ or KRAS G12C+ or HER2+ Present and No Prior Chemo/Immunotherapy OR No Alteration Present, Initial  Chemotherapy/Immunotherapy, PS = 0, 1, BRAF/MET/KRAS/HER2 Mutation Positive, Candidate for Immunotherapy, PD-L1 Expression Positive 1-49% (TPS) / Negative / Not Tested / Awaiting Test Results and Immunotherapy Candidate Therapeutic Status: Stage IV Metastatic Histology: Nonsquamous Cell Broad Molecular Profiling Status: Molecular Analysis Completed Molecular Analysis Results: KRAS G12C Mutation Present and No Prior Chemo/Immunotherapy ECOG Performance Status: 1 Chemotherapy/Immunotherapy Line of Therapy: Initial Chemotherapy/Immunotherapy Immunotherapy Candidate Status: Candidate for Immunotherapy PD-L1 Expression Status: PD-L1 Positive 1-49% (TPS) Intent of Therapy: Non-Curative / Palliative Intent, Discussed with Patient

## 2021-08-03 NOTE — Progress Notes (Signed)
Received message from RN regarding patient needing medication assistance.  Called patient to introduce myself as Arboriculturist and to offer available resources.  Discussed one-time $1000 Radio broadcast assistant to assist with personal expenses while going through treatment. She states she did not need assistance with medication at this time, however wanted to apply for grant. Advised her to stop by front desk on the way out to get one of my cards and call at her earliest convenience to discuss the process. She verbalized understanding.

## 2021-08-03 NOTE — Progress Notes (Signed)
[facility] [phone]   [phone]  OFFICE PROGRESS NOTE  Sue Peng, NP [place] [address]  DIAGNOSIS:  Stage IV (T2a, N0, M1a) non-small cell lung cancer, adenocarcinoma presented with multifocal bilateral pulmonary nodules involving the right upper lobe as well as the smaller bilateral groundglass opacities diagnosed in April 2023.  PD-L1 expression is 4%. Molecular studies by [employer] 360 tissue test showed positive KRAS G12C mutation but the blood test failed secondary to insufficient circulating tumor DNA.  PRIOR THERAPY: None  CURRENT THERAPY: Systemic chemotherapy with carboplatin for AUC of 5, Alimta 500 Mg/M2 and Keytruda 200 Mg IV every 3 weeks.  First dose 08/10/2021.  INTERVAL HISTORY: Sue Chase 66 y.o. female returns to the clinic today for follow-up visit accompanied by her daughter.  The patient continues to complain of increasing fatigue and weakness as well as abdominal aches, bloating and alternating episodes of diarrhea and constipation.  She has no known diagnosis of irritable bowel syndrome.  She denied having any current chest pain, shortness of breath except with exertion with mild cough and no hemoptysis.  She has no nausea, vomiting but has episodes of diarrhea and constipation.  She denied having any recent weight loss or night sweats.  She has no headache or visual changes.  She had molecular studies performed recently that showed positive KRAS G12C mutation and PD-L1 expression was 4%.  She is here for evaluation and discussion of her treatment options.  MEDICAL HISTORY: Past Medical History:  Diagnosis Date   Colon polyps    Hyperlipidemia    Rheumatic fever    Seasonal allergies    UTI (urinary tract infection)     ALLERGIES:  is allergic to codeine.  MEDICATIONS:  Current Outpatient Medications  Medication Sig Dispense Refill   albuterol (VENTOLIN HFA) 108 (90 Base)  MCG/ACT inhaler Inhale 2 puffs into the lungs every 6 (six) hours as needed for wheezing or shortness of breath. (Patient not taking: Reported on 07/06/2021) 8 g 1   apixaban (ELIQUIS) 5 MG TABS tablet Take 1 tablet (5 mg total) by mouth 2 (two) times daily. 180 tablet 1   mirtazapine (REMERON) 15 MG tablet Take 2 tablets (30 mg total) by mouth at bedtime. 30 tablet 2   nicotine (NICODERM CQ - DOSED IN MG/24 HOURS) 14 mg/24hr patch Place 1 patch (14 mg total) onto the skin daily. (Patient not taking: Reported on 06/22/2021) 28 patch 0   omeprazole (PRILOSEC) 20 MG capsule Take 1 capsule (20 mg total) by mouth daily. (Patient not taking: Reported on 07/06/2021) 30 capsule 3   potassium chloride SA (KLOR-CON M) 20 MEQ tablet Take 1 tablet (20 mEq total) by mouth daily. (Patient not taking: Reported on 06/22/2021) 90 tablet 3   No current facility-administered medications for this visit.    SURGICAL HISTORY:  Past Surgical History:  Procedure Laterality Date   BRONCHIAL BIOPSY  06/08/2021   Procedure: BRONCHIAL BIOPSIES;  Surgeon: Garner Nash, DO;  Location: [facility];  Service: Pulmonary;;   BRONCHIAL BRUSHINGS  06/08/2021   Procedure: BRONCHIAL BRUSHINGS;  Surgeon: Garner Nash, DO;  Location: [facility];  Service: Pulmonary;;   BRONCHIAL NEEDLE ASPIRATION BIOPSY  06/08/2021   Procedure: BRONCHIAL NEEDLE ASPIRATION BIOPSIES;  Surgeon: Garner Nash, DO;  Location: [facility];  Service: Pulmonary;;   COLONOSCOPY  2018   SALIVARY GLAND SURGERY     LATE 80S   TUBAL LIGATION  1986   VIDEO BRONCHOSCOPY WITH RADIAL ENDOBRONCHIAL ULTRASOUND  06/08/2021   Procedure: VIDEO BRONCHOSCOPY WITH RADIAL ENDOBRONCHIAL ULTRASOUND;  Surgeon: Garner Nash, DO;  Location: [facility];  Service: Pulmonary;;    REVIEW OF SYSTEMS:  Constitutional: positive for fatigue Eyes: negative Ears, nose, mouth, throat, and face: negative Respiratory: positive for dyspnea on exertion Cardiovascular:  negative Gastrointestinal: positive for abdominal pain, constipation, and diarrhea Genitourinary:negative Integument/breast: negative Hematologic/lymphatic: negative Musculoskeletal:negative Neurological: negative Behavioral/Psych: negative Endocrine: negative Allergic/Immunologic: negative   PHYSICAL EXAMINATION: General appearance: alert, cooperative, fatigued, and no distress Head: Normocephalic, without obvious abnormality, atraumatic Neck: no adenopathy, no JVD, supple, symmetrical, trachea midline, and thyroid not enlarged, symmetric, no tenderness/mass/nodules Lymph nodes: Cervical, supraclavicular, and axillary nodes normal. Resp: clear to auscultation bilaterally Back: symmetric, no curvature. ROM normal. No CVA tenderness. Cardio: regular rate and rhythm, S1, S2 normal, no murmur, click, rub or gallop GI: soft, non-tender; bowel sounds normal; no masses,  no organomegaly Extremities: extremities normal, atraumatic, no cyanosis or edema Neurologic: Alert and oriented X 3, normal strength and tone. Normal symmetric reflexes. Normal coordination and gait  ECOG PERFORMANCE STATUS: 1 - Symptomatic but completely ambulatory  Blood pressure (!) 153/79, pulse 77, temperature (!) 97.5 F (36.4 C), temperature source Tympanic, resp. rate 18, weight 100 lb 8 oz (45.6 kg), SpO2 96 %.  LABORATORY DATA: Lab Results  Component Value Date   WBC 12.2 (H) 08/03/2021   HGB 18.4 (H) 08/03/2021   HCT 52.6 (H) 08/03/2021   MCV 103.5 (H) 08/03/2021   PLT 300 08/03/2021      Chemistry      Component Value Date/Time   NA 139 08/03/2021 0832   K 3.0 (L) 08/03/2021 0832   CL 99 08/03/2021 0832   CO2 33 (H) 08/03/2021 0832   BUN 10 08/03/2021 0832   CREATININE 0.57 08/03/2021 0832      Component Value Date/Time   CALCIUM 10.0 08/03/2021 0832   ALKPHOS 240 (H) 08/03/2021 0832   AST 12 (L) 08/03/2021 0832   ALT 13 08/03/2021 0832   BILITOT 1.1 08/03/2021 0832       RADIOGRAPHIC  STUDIES: No results found.  ASSESSMENT AND PLAN: This is a very pleasant 66 years old white female diagnosed with a stage IV (T2 a, N0, M1 a) non-small cell lung cancer, adenocarcinoma presented with multifocal bilateral pulmonary nodules involving the right upper lobe as well as the smaller bilateral groundglass opacities diagnosed in April 2023 with positive KRAS G12C mutation and PD-L1 expression of 4%. I had a lengthy discussion with the patient and her daughter today about her current disease stage, prognosis and treatment options. I discussed with the patient several options for management of her condition including SBRT to the large left upper lobe lung nodule followed by close monitoring of the other bilateral groundglass opacities versus consideration of systemic chemotherapy with carboplatin for AUC of 5, Alimta 500 Mg/M2 and Keytruda 200 Mg IV every 3 weeks versus treatment with just immunotherapy with ipilimumab 1 Mg/KG every 6 weeks and nivolumab 360 Mg IV every 3 weeks. After discussion of all the treatment options the patient would like to proceed with the combination of systemic chemotherapy with carboplatin, Alimta and Keytruda. I discussed with the patient the adverse effect of this treatment including but not limited to alopecia, myelosuppression, nausea and vomiting, peripheral neuropathy, liver or renal dysfunction as well as immunotherapy adverse effects. The patient will receive vitamin B12 injection today. I will call her pharmacy with prescription  for Compazine 10 mg p.o. every 6 hours as needed for nausea and folic acid 1 mg p.o. daily. I will arrange for the patient to have a chemotherapy education class before the first dose of her treatment. I will also refer the patient to interventional radiology for consideration of Port-A-Cath placement. The patient is expected to start the first dose of her treatment next week. For the hypokalemia, I will send potassium supplement to  her pharmacy. She will come back for follow-up visit in 2 weeks for evaluation and management of any adverse effect of her treatment. The patient was advised to call immediately if she has any other concerning symptoms in the interval. The patient voices understanding of current disease status and treatment options and is in agreement with the current care plan.  All questions were answered. The patient knows to call the clinic with any problems, questions or concerns. We can certainly see the patient much sooner if necessary.  The total time spent in the appointment was 55 minutes.  Disclaimer: This note was dictated with voice recognition software. Similar sounding words can inadvertently be transcribed and may not be corrected upon review.

## 2021-08-04 ENCOUNTER — Telehealth: Payer: Self-pay | Admitting: Internal Medicine

## 2021-08-04 NOTE — Telephone Encounter (Signed)
Scheduled per 06/13 los, patient has been called and notified of upcoming appointments.

## 2021-08-06 ENCOUNTER — Inpatient Hospital Stay: Payer: BC Managed Care – PPO

## 2021-08-06 ENCOUNTER — Other Ambulatory Visit: Payer: Self-pay

## 2021-08-09 MED FILL — Dexamethasone Sodium Phosphate Inj 100 MG/10ML: INTRAMUSCULAR | Qty: 1 | Status: AC

## 2021-08-09 MED FILL — Fosaprepitant Dimeglumine For IV Infusion 150 MG (Base Eq): INTRAVENOUS | Qty: 5 | Status: AC

## 2021-08-10 ENCOUNTER — Other Ambulatory Visit: Payer: Self-pay

## 2021-08-10 ENCOUNTER — Inpatient Hospital Stay: Payer: BC Managed Care – PPO

## 2021-08-10 ENCOUNTER — Other Ambulatory Visit: Payer: BC Managed Care – PPO

## 2021-08-10 ENCOUNTER — Ambulatory Visit: Payer: BC Managed Care – PPO | Admitting: Internal Medicine

## 2021-08-10 VITALS — BP 122/81 | HR 64 | Temp 97.7°F | Resp 18 | Wt 100.5 lb

## 2021-08-10 DIAGNOSIS — R197 Diarrhea, unspecified: Secondary | ICD-10-CM | POA: Diagnosis not present

## 2021-08-10 DIAGNOSIS — Z79899 Other long term (current) drug therapy: Secondary | ICD-10-CM | POA: Diagnosis not present

## 2021-08-10 DIAGNOSIS — E538 Deficiency of other specified B group vitamins: Secondary | ICD-10-CM | POA: Diagnosis not present

## 2021-08-10 DIAGNOSIS — R140 Abdominal distension (gaseous): Secondary | ICD-10-CM | POA: Diagnosis not present

## 2021-08-10 DIAGNOSIS — R5383 Other fatigue: Secondary | ICD-10-CM | POA: Diagnosis not present

## 2021-08-10 DIAGNOSIS — R531 Weakness: Secondary | ICD-10-CM | POA: Diagnosis not present

## 2021-08-10 DIAGNOSIS — Z5112 Encounter for antineoplastic immunotherapy: Secondary | ICD-10-CM | POA: Diagnosis not present

## 2021-08-10 DIAGNOSIS — C3491 Malignant neoplasm of unspecified part of right bronchus or lung: Secondary | ICD-10-CM

## 2021-08-10 DIAGNOSIS — K5900 Constipation, unspecified: Secondary | ICD-10-CM | POA: Diagnosis not present

## 2021-08-10 DIAGNOSIS — C3411 Malignant neoplasm of upper lobe, right bronchus or lung: Secondary | ICD-10-CM | POA: Diagnosis not present

## 2021-08-10 DIAGNOSIS — E876 Hypokalemia: Secondary | ICD-10-CM | POA: Diagnosis not present

## 2021-08-10 DIAGNOSIS — Z5111 Encounter for antineoplastic chemotherapy: Secondary | ICD-10-CM | POA: Diagnosis not present

## 2021-08-10 LAB — CMP (CANCER CENTER ONLY)
ALT: 30 U/L (ref 0–44)
AST: 25 U/L (ref 15–41)
Albumin: 3.7 g/dL (ref 3.5–5.0)
Alkaline Phosphatase: 284 U/L — ABNORMAL HIGH (ref 38–126)
Anion gap: 6 (ref 5–15)
BUN: 10 mg/dL (ref 8–23)
CO2: 27 mmol/L (ref 22–32)
Calcium: 9.8 mg/dL (ref 8.9–10.3)
Chloride: 101 mmol/L (ref 98–111)
Creatinine: 0.57 mg/dL (ref 0.44–1.00)
GFR, Estimated: >60 mL/min (ref >60)
Glucose, Bld: 98 mg/dL (ref 70–99)
Potassium: 4 mmol/L (ref 3.5–5.1)
Sodium: 134 mmol/L — ABNORMAL LOW (ref 135–145)
Total Bilirubin: 0.9 mg/dL (ref 0.3–1.2)
Total Protein: 7.4 g/dL (ref 6.5–8.1)

## 2021-08-10 LAB — CBC WITH DIFFERENTIAL (CANCER CENTER ONLY)
Abs Immature Granulocytes: 0.04 10*3/uL (ref 0.00–0.07)
Basophils Absolute: 0.1 10*3/uL (ref 0.0–0.1)
Basophils Relative: 1 %
Eosinophils Absolute: 0.2 10*3/uL (ref 0.0–0.5)
Eosinophils Relative: 3 %
HCT: 53.3 % — ABNORMAL HIGH (ref 36.0–46.0)
Hemoglobin: 18.5 g/dL — ABNORMAL HIGH (ref 12.0–15.0)
Immature Granulocytes: 1 %
Lymphocytes Relative: 29 %
Lymphs Abs: 2.2 10*3/uL (ref 0.7–4.0)
MCH: 36.1 pg — ABNORMAL HIGH (ref 26.0–34.0)
MCHC: 34.7 g/dL (ref 30.0–36.0)
MCV: 104.1 fL — ABNORMAL HIGH (ref 80.0–100.0)
Monocytes Absolute: 0.9 10*3/uL (ref 0.1–1.0)
Monocytes Relative: 12 %
Neutro Abs: 4.1 10*3/uL (ref 1.7–7.7)
Neutrophils Relative %: 54 %
Platelet Count: 266 10*3/uL (ref 150–400)
RBC: 5.12 MIL/uL — ABNORMAL HIGH (ref 3.87–5.11)
RDW: 16.1 % — ABNORMAL HIGH (ref 11.5–15.5)
WBC Count: 7.6 10*3/uL (ref 4.0–10.5)
nRBC: 0 % (ref 0.0–0.2)

## 2021-08-10 LAB — TSH: TSH: 2.031 u[IU]/mL (ref 0.350–4.500)

## 2021-08-10 MED ORDER — SODIUM CHLORIDE 0.9 % IV SOLN
360.0000 mg | Freq: Once | INTRAVENOUS | Status: AC
  Administered 2021-08-10: 360 mg via INTRAVENOUS
  Filled 2021-08-10: qty 36

## 2021-08-10 MED ORDER — SODIUM CHLORIDE 0.9 % IV SOLN
150.0000 mg | Freq: Once | INTRAVENOUS | Status: AC
  Administered 2021-08-10: 150 mg via INTRAVENOUS
  Filled 2021-08-10: qty 150

## 2021-08-10 MED ORDER — SODIUM CHLORIDE 0.9 % IV SOLN: Freq: Once | INTRAVENOUS | Status: AC

## 2021-08-10 MED ORDER — PALONOSETRON HCL INJECTION 0.25 MG/5ML
0.2500 mg | Freq: Once | INTRAVENOUS | Status: AC
  Administered 2021-08-10: 0.25 mg via INTRAVENOUS
  Filled 2021-08-10: qty 5

## 2021-08-10 MED ORDER — SODIUM CHLORIDE 0.9 % IV SOLN
200.0000 mg | Freq: Once | INTRAVENOUS | Status: AC
  Administered 2021-08-10: 200 mg via INTRAVENOUS
  Filled 2021-08-10: qty 200

## 2021-08-10 MED ORDER — SODIUM CHLORIDE 0.9 % IV SOLN
10.0000 mg | Freq: Once | INTRAVENOUS | Status: AC
  Administered 2021-08-10: 10 mg via INTRAVENOUS
  Filled 2021-08-10: qty 10

## 2021-08-10 MED ORDER — SODIUM CHLORIDE 0.9 % IV SOLN
500.0000 mg/m2 | Freq: Once | INTRAVENOUS | Status: AC
  Administered 2021-08-10: 700 mg via INTRAVENOUS
  Filled 2021-08-10: qty 8

## 2021-08-10 NOTE — Patient Instructions (Signed)
[facility]  Discharge Instructions: Thank you for choosing [facility] to provide your oncology and hematology care.   If you have a lab appointment with the [facility], please go directly to the [facility] and check in at the registration area.   Wear comfortable clothing and clothing appropriate for easy access to any Portacath or PICC line.   We strive to give you quality time with your provider. You may need to reschedule your appointment if you arrive late (15 or more minutes).  Arriving late affects you and other patients whose appointments are after yours.  Also, if you miss three or more appointments without notifying the office, you may be dismissed from the clinic at the provider's discretion.      For prescription refill requests, have your pharmacy contact our office and allow 72 hours for refills to be completed.    Today you received the following chemotherapy and/or immunotherapy agents; Keytruda, Alimta, & Carboplatin      To help prevent nausea and vomiting after your treatment, we encourage you to take your nausea medication as directed.  BELOW ARE SYMPTOMS THAT SHOULD BE REPORTED IMMEDIATELY: *FEVER GREATER THAN 100.4 F (38 C) OR HIGHER *CHILLS OR SWEATING *NAUSEA AND VOMITING THAT IS NOT CONTROLLED WITH YOUR NAUSEA MEDICATION *UNUSUAL SHORTNESS OF BREATH *UNUSUAL BRUISING OR BLEEDING *URINARY PROBLEMS (pain or burning when urinating, or frequent urination) *BOWEL PROBLEMS (unusual diarrhea, constipation, pain near the anus) TENDERNESS IN MOUTH AND THROAT WITH OR WITHOUT PRESENCE OF ULCERS (sore throat, sores in mouth, or a toothache) UNUSUAL RASH, SWELLING OR PAIN  UNUSUAL VAGINAL DISCHARGE OR ITCHING   Items with * indicate a potential emergency and should be followed up as soon as possible or go to the Emergency Department if any problems should occur.  Please show the CHEMOTHERAPY ALERT CARD or IMMUNOTHERAPY  ALERT CARD at check-in to the Emergency Department and triage nurse.  Should you have questions after your visit or need to cancel or reschedule your appointment, please contact [facility]  Dept: [phone]  and follow the prompts.  Office hours are 8:00 a.m. to 4:30 p.m. Monday - Friday. Please note that voicemails left after 4:00 p.m. may not be returned until the following business day.  We are closed weekends and major holidays. You have access to a nurse at all times for urgent questions. Please call the main number to the clinic Dept: [phone] and follow the prompts.   For any non-urgent questions, you may also contact your provider using MyChart. We now offer e-Visits for anyone 66 and older to request care online for non-urgent symptoms. For details visit [email or web address].   Also download the MyChart app! Go to the app store, search "MyChart", open the app, select [facility], and log in with your MyChart username and password.  Masks are optional in the cancer centers. If you would like for your care team to wear a mask while they are taking care of you, please let them know. For doctor visits, patients may have with them one support Anuja Manka who is at least 66 years old. At this time, visitors are not allowed in the infusion area.

## 2021-08-11 ENCOUNTER — Telehealth: Payer: Self-pay

## 2021-08-11 ENCOUNTER — Other Ambulatory Visit (HOSPITAL_COMMUNITY): Payer: Self-pay | Admitting: Physician Assistant

## 2021-08-11 NOTE — Telephone Encounter (Signed)
-----   Message from Daphane Shepherd, RN sent at 08/10/2021  3:36 PM EDT ----- Regarding: First time Clide Deutscher, [place] Pt of Dr Julien Nordmann, first time [place], alimta, and carboplatin. Did great no signs of distress and no complaints at discharge.

## 2021-08-11 NOTE — H&P (Signed)
Chief Complaint: Patient was seen in consultation today for stage IV non-small cell lung cancer at the request of Curt Bears  Referring Physician(s): Curt Bears  Supervising Physician: Ruthann Cancer  Patient Status: [name] - Out-pt  History of Present Illness: Sue Chase is a 66 y.o. female with PMH significant for dyslipidemia, rheumatic fever, colon polyps and DVT with bilateral pulmonary embolism.  In December 22 patient noticed swelling and pain in the right lower extremity and presented to her primary care physician.  Ultrasound at that time showed evidence for right lower extremity DVT.  Patient had CT angiogram of chest February 18, 2021 that showed bilateral PE with scattered clusters of groundglass opacities.  Patient was referred to Dr. Antony Contras and super D of the chest was done 05/10/2021 that showed part solid nodule in the right upper lobe as well as stable bilateral solid pulmonary nodules with solid nodule of left upper lobe.  Patient underwent bronchoscopy and biopsy of left upper lobe lung mass.  Biopsy showed adenocarcinoma.  Patient diagnosed with stage IV non-small cell lung cancer with limited disease metastasis.  Patient was referred to IR for tunneled catheter with port placement for treatment.  Past Medical History:  Diagnosis Date   Colon polyps    Complication of anesthesia    tolerated propofol but other meds made her emotional / cry/ difficulty breathing/ panic   Hyperlipidemia    Rheumatic fever    Seasonal allergies    UTI (urinary tract infection)     Past Surgical History:  Procedure Laterality Date   BRONCHIAL BIOPSY  06/08/2021   Procedure: BRONCHIAL BIOPSIES;  Surgeon: Garner Nash, DO;  Location: [facility];  Service: Pulmonary;;   BRONCHIAL BRUSHINGS  06/08/2021   Procedure: BRONCHIAL BRUSHINGS;  Surgeon: Garner Nash, DO;  Location: [facility];  Service: Pulmonary;;   BRONCHIAL NEEDLE ASPIRATION BIOPSY  06/08/2021    Procedure: BRONCHIAL NEEDLE ASPIRATION BIOPSIES;  Surgeon: Garner Nash, DO;  Location: [facility];  Service: Pulmonary;;   COLONOSCOPY  2018   SALIVARY GLAND SURGERY     LATE 80S   TUBAL LIGATION  1986   VIDEO BRONCHOSCOPY WITH RADIAL ENDOBRONCHIAL ULTRASOUND  06/08/2021   Procedure: VIDEO BRONCHOSCOPY WITH RADIAL ENDOBRONCHIAL ULTRASOUND;  Surgeon: Garner Nash, DO;  Location: [facility];  Service: Pulmonary;;    Allergies: Codeine  Medications: Prior to Admission medications   Medication Sig Start Date End Date Taking? Authorizing Provider  albuterol (VENTOLIN HFA) 108 (90 Base) MCG/ACT inhaler Inhale 2 puffs into the lungs every 6 (six) hours as needed for wheezing or shortness of breath. Patient not taking: Reported on 07/06/2021 02/21/21   Flora Lipps, MD  apixaban (ELIQUIS) 5 MG TABS tablet Take 1 tablet (5 mg total) by mouth 2 (two) times daily. 04/30/21 07/29/21  Nafziger, Tommi Rumps, NP  folic acid (FOLVITE) 1 MG tablet Take 1 tablet (1 mg total) by mouth daily. 08/03/21   Curt Bears, MD  lidocaine-prilocaine (EMLA) cream Apply to the Port-A-Cath site 30 minutes before chemotherapy 08/03/21   Curt Bears, MD  mirtazapine (REMERON) 15 MG tablet Take 2 tablets (30 mg total) by mouth at bedtime. 07/06/21   Curt Bears, MD  nicotine (NICODERM CQ - DOSED IN MG/24 HOURS) 14 mg/24hr patch Place 1 patch (14 mg total) onto the skin daily. Patient not taking: Reported on 06/22/2021 02/21/21   Flora Lipps, MD  omeprazole (PRILOSEC) 20 MG capsule Take 1 capsule (20 mg total) by mouth daily. Patient not taking:  Reported on 07/06/2021 11/20/17   [name], NP  potassium chloride SA (KLOR-CON M) 20 MEQ tablet Take 1 tablet (20 mEq total) by mouth daily. Patient not taking: Reported on 06/22/2021 03/09/21 03/04/22  [name], NP  potassium chloride SA (KLOR-CON M) 20 MEQ tablet Take 1 tablet (20 mEq total) by mouth daily. 08/03/21   Curt Bears, MD  prochlorperazine  (COMPAZINE) 10 MG tablet Take 1 tablet (10 mg total) by mouth every 6 (six) hours as needed for nausea or vomiting. 08/03/21   Curt Bears, MD     Family History  Problem Relation Age of Onset   Arthritis Mother    Diabetes Mother    Heart disease Mother    Hyperlipidemia Mother    Hypertension Mother    Miscarriages / [place] Mother    Hearing loss Father    Liver cancer Father    Lung cancer Father    Hyperlipidemia Father    Diabetes Brother    Lung disease Maternal Grandfather        Black Lung   Diabetes Paternal Grandmother    Cancer Paternal Grandfather    Diabetes Paternal Grandfather     Social History   Socioeconomic History   Marital status: Widowed    Spouse name: Not on file   Number of children: Not on file   Years of education: Not on file   Highest education level: Not on file  Occupational History   Not on file  Tobacco Use   Smoking status: Some Days    Packs/day: 1.00    Years: 40.00    Total pack years: 40.00    Types: Cigarettes    Passive exposure: Never   Smokeless tobacco: Never   Tobacco comments:    1/2 ppd or less 06/15/21. Hsm/.   Vaping Use   Vaping Use: Never used  Substance and Sexual Activity   Alcohol use: Yes    Comment: Rum and Coke/Unsure of amount   Drug use: Never   Sexual activity: Not on file  Other Topics Concern   Not on file  Social History Narrative   Married    Two grown children    She enjoys reading, walking   Social Determinants of Health   Financial Resource Strain: Not on file  Food Insecurity: Not on file  Transportation Needs: Not on file  Physical Activity: Not on file  Stress: Not on file  Social Connections: Not on file     Review of Systems: A 12 point ROS discussed and pertinent positives are indicated in the HPI above.  All other systems are negative.  Review of Systems  Constitutional:  Negative for chills, fatigue and fever.  Respiratory:  Negative for shortness of breath.    Cardiovascular:  Negative for chest pain.  Gastrointestinal:  Positive for nausea. Negative for abdominal pain and vomiting.  Neurological:  Negative for headaches.  Psychiatric/Behavioral:  Negative for confusion. The patient is nervous/anxious.     Vital Signs: There were no vitals taken for this visit.    Physical Exam Constitutional:      General: She is not in acute distress.    Appearance: Normal appearance. She is not ill-appearing.  Eyes:     Extraocular Movements: Extraocular movements intact.     Pupils: Pupils are equal, round, and reactive to light.  Cardiovascular:     Pulses: Normal pulses.     Heart sounds: Normal heart sounds.  Pulmonary:     Effort: Pulmonary effort  is normal. No respiratory distress.     Breath sounds: Normal breath sounds.  Musculoskeletal:     Right lower leg: No edema.     Left lower leg: No edema.  Neurological:     Mental Status: She is alert and oriented to person, place, and time.  Psychiatric:        Mood and Affect: Mood normal.        Behavior: Behavior normal.        Thought Content: Thought content normal.        Judgment: Judgment normal.     Imaging: No results found.  Labs:  CBC: Recent Labs    06/08/21 0923 07/06/21 1346 08/03/21 0832 08/10/21 1117  WBC 6.4 9.1 12.2* 7.6  HGB 17.5* 18.8* 18.4* 18.5*  HCT 48.8* 53.3* 52.6* 53.3*  PLT 170 203 300 266    COAGS: Recent Labs    02/18/21 2220  INR 1.0  APTT 31    BMP: Recent Labs    02/21/21 0531 02/26/21 1357 03/11/21 1052 07/06/21 1346 08/03/21 0832 08/10/21 1117  NA 133*   < > 138 141 139 134*  K 2.7*   < > 3.8 3.3* 3.0* 4.0  CL 99   < > 99 101 99 101  CO2 26   < > 30 34* 33* 27  GLUCOSE 94   < > 79 96 94 98  BUN 6*   < > 10 6* 10 10  CALCIUM 8.5*   < > 9.3 9.1 10.0 9.8  CREATININE 0.38*   < > 0.60 0.66 0.57 0.57  GFRNONAA >60  --   --  >60 >60 >60   < > = values in this interval not displayed.    LIVER FUNCTION TESTS: Recent Labs     02/20/21 0005 03/15/21 1448 07/06/21 1346 08/03/21 0832 08/10/21 1117  BILITOT 1.8* 0.6 1.2 1.1 0.9  AST 20  --  16 12* 25  ALT 10  --  7 13 30   ALKPHOS 187*  --  124 240* 284*  PROT 6.1*  --  6.8 7.1 7.4  ALBUMIN 2.7*  --  3.7 3.6 3.7    TUMOR MARKERS: No results for input(s): "AFPTM", "CEA", "CA199", "CHROMGRNA" in the last 8760 hours.  Assessment and Plan: History of dyslipidemia, rheumatic fever, colon polyps and DVT with bilateral pulmonary embolism.  In December 22 patient noticed swelling and pain in the right lower extremity and presented to her primary care physician.  Ultrasound at that time showed evidence for right lower extremity DVT.  Patient had CT angiogram of chest February 18, 2021 that showed bilateral PE with scattered clusters of groundglass opacities.  Patient was referred to Dr. Antony Contras and super D of the chest was done 05/10/2021 that showed part solid nodule in the right upper lobe as well as stable bilateral solid pulmonary nodules with solid nodule of left upper lobe.  Patient underwent bronchoscopy and biopsy of left upper lobe lung mass.  Biopsy showed adenocarcinoma.  Patient diagnosed with stage IV non-small cell lung cancer with limited disease metastasis.  Patient was referred to IR for tunneled catheter with port placement for treatment.  Pt sitting upright on stretcher. She is A&O. Pt is tearful and anxious and worried about pain with procedure.  Pt daughter on speaker phone during consult.  Pt states she is NPO order.  Pt states she has held her Eliquis per order.    Risks and benefits of image guided tunneled catheter with  port placement with moderate sedation was discussed with the patient including, but not limited to bleeding, infection, pneumothorax, or fibrin sheath development and need for additional procedures.  All of the patient's questions were answered, patient is agreeable to proceed. Consent signed and in chart.   Thank you for this  interesting consult.  I greatly enjoyed meeting Sue Chase and look forward to participating in their care.  A copy of this report was sent to the requesting provider on this date.  Electronically Signed: Tyson Alias, NP 08/12/2021, 2:58 PM   I spent a total of 20 minutes in face to face in clinical consultation, greater than 50% of which was counseling/coordinating care for stage IV non-small cell lung cancer.

## 2021-08-11 NOTE — Telephone Encounter (Signed)
Sue Chase states that she is fine. She is eating, drinking, and urinating well. She knows to call the office at [phone] if she has andy questions or concerns.

## 2021-08-12 ENCOUNTER — Encounter (HOSPITAL_COMMUNITY): Payer: Self-pay

## 2021-08-12 ENCOUNTER — Ambulatory Visit (HOSPITAL_COMMUNITY)
Admission: RE | Admit: 2021-08-12 | Discharge: 2021-08-12 | Disposition: A | Discharge status: Home / Self Care | Payer: BC Managed Care – PPO | Source: Ambulatory Visit | Attending: Internal Medicine | Admitting: Internal Medicine

## 2021-08-12 ENCOUNTER — Other Ambulatory Visit: Payer: Self-pay | Admitting: Internal Medicine

## 2021-08-12 ENCOUNTER — Encounter: Payer: Self-pay | Admitting: *Deleted

## 2021-08-12 DIAGNOSIS — F17210 Nicotine dependence, cigarettes, uncomplicated: Secondary | ICD-10-CM | POA: Insufficient documentation

## 2021-08-12 DIAGNOSIS — C3490 Malignant neoplasm of unspecified part of unspecified bronchus or lung: Secondary | ICD-10-CM | POA: Diagnosis not present

## 2021-08-12 DIAGNOSIS — C3491 Malignant neoplasm of unspecified part of right bronchus or lung: Secondary | ICD-10-CM

## 2021-08-12 DIAGNOSIS — Z452 Encounter for adjustment and management of vascular access device: Secondary | ICD-10-CM | POA: Diagnosis not present

## 2021-08-12 DIAGNOSIS — E785 Hyperlipidemia, unspecified: Secondary | ICD-10-CM | POA: Diagnosis not present

## 2021-08-12 HISTORY — PX: IR IMAGING GUIDED PORT INSERTION: IMG5740

## 2021-08-12 HISTORY — DX: Other complications of anesthesia, initial encounter: T88.59XA

## 2021-08-12 IMAGING — US IR IMAGING GUIDED PORT INSERTION
1 series · 1 of 1 positions shown · non-contrast
Comparison: None Available.

INDICATION: 65-year-old female with advanced stage lung cancer requiring central
venous access for chemotherapy administration.

EXAM:
IMPLANTED PORT A CATH PLACEMENT WITH ULTRASOUND AND FLUOROSCOPIC
GUIDANCE

[Series 1: ir fluoro/shunt/fist · 1 of 1 slices shown]
[im 1/1]
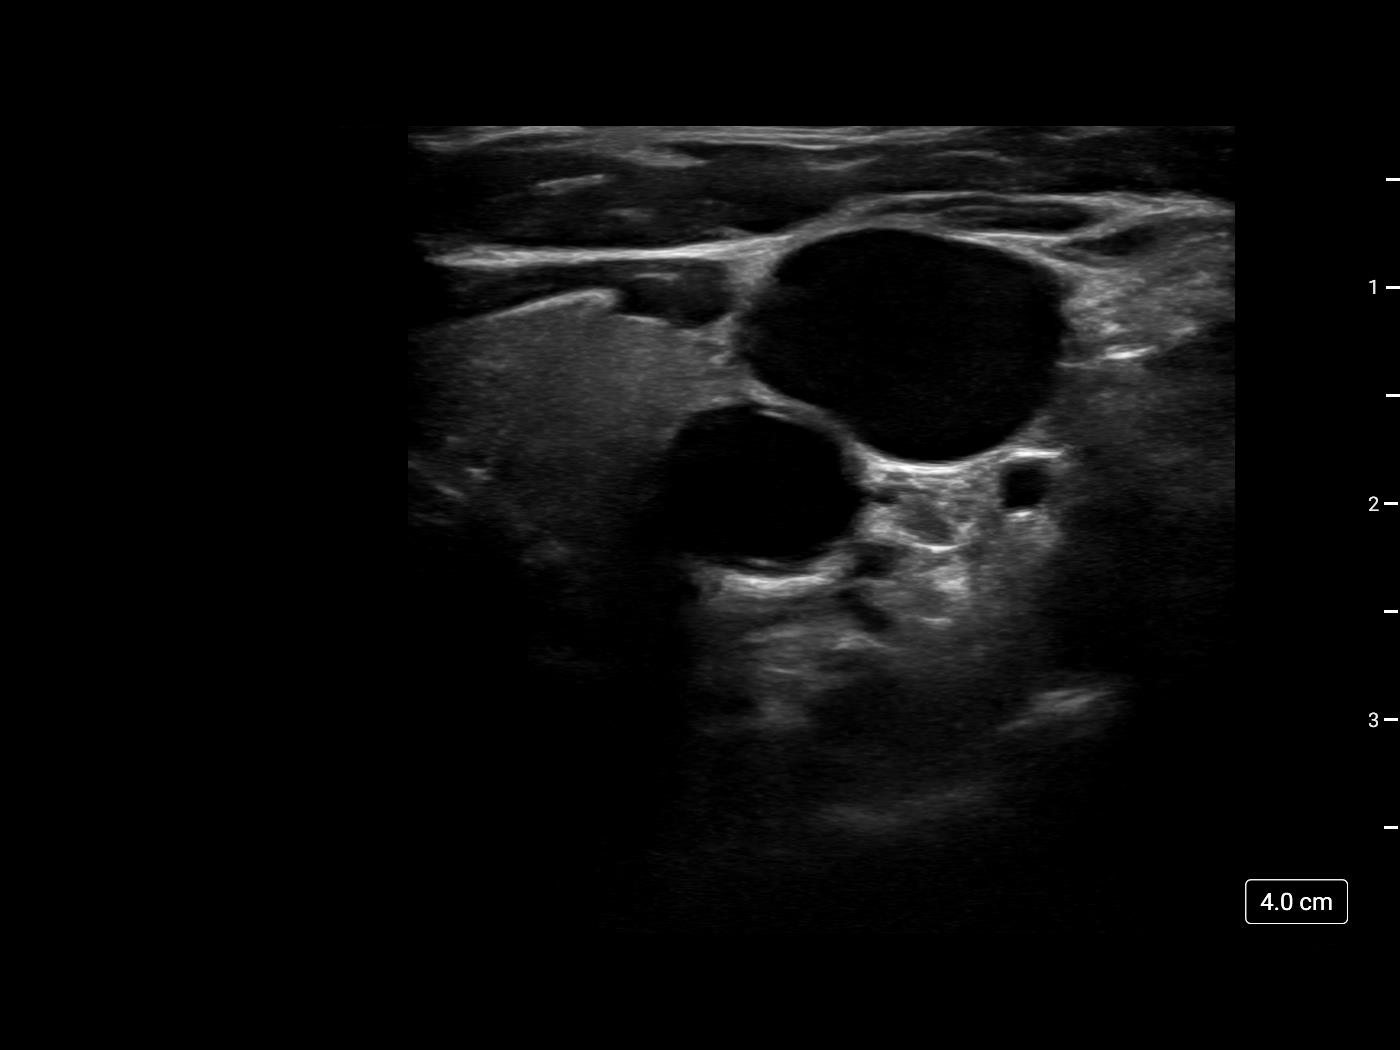

[1 of 1 positions shown; findings below may reference images not displayed]

MEDICATIONS:
None.

ANESTHESIA/SEDATION:
Moderate (conscious) sedation was employed during this procedure. A
total of Versed 0 mg and Fentanyl 100 mcg was administered
intravenously.

Moderate Sedation Time: 0 minutes. The patient's level of
consciousness and vital signs were monitored continuously by
radiology nursing throughout the procedure under my direct
supervision.

CONTRAST:  None

FLUOROSCOPY TIME:  0.01 mGy

COMPLICATIONS:
None immediate.

PROCEDURE:
The procedure, risks, benefits, and alternatives were explained to
the patient. Questions regarding the procedure were encouraged and
answered. The patient understands and consents to the procedure.

The right neck and chest were prepped with chlorhexidine in a
sterile fashion, and a sterile drape was applied covering the
operative field. Maximum barrier sterile technique with sterile
gowns and gloves were used for the procedure. A timeout was
performed prior to the initiation of the procedure.

Ultrasound was used to examine the jugular vein which was
compressible and free of internal echoes. A skin marker was used to
demarcate the planned venotomy and port pocket incision sites. Local
anesthesia was provided to these sites and the subcutaneous tunnel
track with 1% lidocaine with [DATE] epinephrine.

A small incision was created at the jugular access site and blunt
dissection was performed of the subcutaneous tissues. Under
ultrasound guidance, the jugular vein was accessed with a 21 ga
micropuncture needle and an 0.018" wire was inserted to the superior
vena cava. Real-time ultrasound guidance was utilized for vascular
access including the acquisition of a permanent ultrasound image
documenting patency of the accessed vessel. A 5 Fr micopuncture set
was then used, through which a 0.035" Rosen wire was passed under
fluoroscopic guidance into the inferior vena cava. An 8 Fr dilator
was then placed over the wire.

A subcutaneous port pocket was then created along the upper chest
wall utilizing a combination of sharp and blunt dissection. The
pocket was irrigated with sterile saline, packed with gauze, and
observed for hemorrhage. A single lumen slim sized power injectable
port was chosen for placement. The 8 Fr catheter was tunneled from
the port pocket site to the venotomy incision. The port was placed
in the pocket. The external catheter was trimmed to appropriate
length. The dilator was exchanged for an 8 Fr peel-away sheath under
fluoroscopic guidance. The catheter was then placed through the
sheath and the sheath was removed. Final catheter positioning was
confirmed and documented with a fluoroscopic spot radiograph. The
port was accessed with MERELABELAIR needle, aspirated, and flushed with
heparinized saline.

The deep dermal layer of the port pocket incision was closed with
interrupted 3-0 Vicryl suture. The skin was opposed with a running
subcuticular 4-0 Monocryl suture. Dermabond was then placed over the
port pocket and neck incisions. The patient tolerated the procedure
well without immediate post procedural complication.
FINDINGS: After catheter placement, the tip lies within the superior
cavoatrial junction. The catheter aspirates and flushes normally and
is ready for immediate use.
IMPRESSION: Successful placement of a power injectable Port-A-Cath via the right
internal jugular vein. The catheter is ready for immediate use.

## 2021-08-12 MED ORDER — LIDOCAINE-EPINEPHRINE 1 %-1:100000 IJ SOLN
INTRAMUSCULAR | Status: AC | PRN
  Administered 2021-08-12: 10 mL via INTRADERMAL

## 2021-08-12 MED ORDER — FENTANYL CITRATE (PF) 100 MCG/2ML IJ SOLN
INTRAMUSCULAR | Status: AC
  Filled 2021-08-12: qty 2

## 2021-08-12 MED ORDER — SODIUM CHLORIDE 0.9 % IV SOLN: INTRAVENOUS | Status: DC

## 2021-08-12 MED ORDER — FENTANYL CITRATE (PF) 100 MCG/2ML IJ SOLN
INTRAMUSCULAR | Status: AC | PRN
  Administered 2021-08-12: 50 ug via INTRAVENOUS

## 2021-08-12 MED ORDER — HEPARIN SOD (PORK) LOCK FLUSH 100 UNIT/ML IV SOLN
INTRAVENOUS | Status: AC
  Filled 2021-08-12: qty 5

## 2021-08-12 MED ORDER — LIDOCAINE-EPINEPHRINE 1 %-1:100000 IJ SOLN
INTRAMUSCULAR | Status: AC
  Filled 2021-08-12: qty 1

## 2021-08-12 NOTE — Procedures (Signed)
Interventional Radiology Procedure Note ° °Procedure: Single Lumen Power Port Placement   ° °Access:  Right internal jugular vein ° °Findings: Catheter tip positioned at cavoatrial junction. Port is ready for immediate use.  ° °Complications: None ° °EBL: < 10 mL ° °Recommendations:  °- Ok to shower in 24 hours °- Do not submerge for 7 days °- Routine line care  ° ° °Norma Montemurro, MD ° ° ° °

## 2021-08-12 NOTE — Progress Notes (Signed)
Oncology Nurse Navigator Documentation     08/12/2021    4:00 PM 07/13/2021    8:00 AM 07/12/2021    2:00 PM 06/29/2021   10:00 AM 06/22/2021   11:45 AM 06/16/2021    9:49 AM  Oncology Nurse Navigator Flowsheets  Abnormal Finding Date 02/18/2021       Confirmed Diagnosis Date 06/08/2021       Diagnosis Status  Confirmed Diagnosis Complete   Additional Work Up Confirmed Diagnosis Complete  Planned Course of Treatment Chemotherapy;Targeted Therapy       Phase of Treatment Targeted Therapy       Chemotherapy Actual Start Date: 08/03/2021       Targeted Therapy Actual Start Date: 08/03/2021       Navigator Follow Up Date:  08/03/2021  07/05/2021    Navigator Follow Up Reason:  Follow-up Appointment  Review Note Follow-up Appointment Follow-up Appointment  Navigation Complete Date: 08/12/2021       Post Navigation: Continue to Follow Patient? No       Reason Not Navigating Patient: Other:       Navigator Location [facility]  Navigator Encounter Type Other: Telephone Telephone Pathology Review    Telephone  Outgoing Call Outgoing Call     Treatment Initiated Date 08/03/2021       Patient Visit Type Other       Treatment Phase Treatment Pre-Tx/Tx Discussion    Follow-up;Abnormal Scans  Barriers/Navigation Needs Coordination of Care Coordination of Care;Education Education;Coordination of Care Coordination of Care Coordination of Care;Financial Toxicity Anxiety;Coordination of Care  Education  Other Other     Interventions Coordination of Care Education;Psycho-Social Support;Coordination of Care Coordination of Care;Education Coordination of Care Coordination of Care;Education;Psycho-Social Support;Referrals   Acuity Level 2-Minimal Needs (1-2 Barriers Identified) Level 2-Minimal Needs (1-2 Barriers Identified) Level 2-Minimal Needs (1-2 Barriers Identified) Level 2-Minimal Needs (1-2 Barriers Identified) Level  2-Minimal Needs (1-2 Barriers Identified)   Referrals     Financial Counseling   Coordination of Care  Other  Pathology Appts   Education Method  Verbal      Time Spent with Patient 30 30 15 15  90 45

## 2021-08-12 NOTE — Discharge Instructions (Addendum)
Please call Interventional Radiology clinic [phone] with any questions or concerns.  You may remove your dressing and shower tomorrow.  DO NOT use EMLA cream on your port site for 2 weeks as this cream will remove surgical glue on your incision.  Implanted Port Insertion, Care After This sheet gives you information about how to care for yourself after your procedure. Your health care provider may also give you more specific instructions. If you have problems or questions, contact your health care provider. What can I expect after the procedure? After the procedure, it is common to have: Discomfort at the port insertion site. Bruising on the skin over the port. This should improve over 3-4 days. Follow these instructions at home: [facility] care After your port is placed, you will get a manufacturer's information card. The card has information about your port. Keep this card with you at all times. Take care of the port as told by your health care provider. Ask your health care provider if you or a family member can get training for taking care of the port at home. A home health care nurse may also take care of the port. Make sure to remember what type of port you have. Incision care Follow instructions from your health care provider about how to take care of your port insertion site. Make sure you: Wash your hands with soap and water before and after you change your bandage (dressing). If soap and water are not available, use hand sanitizer. Change your dressing as told by your health care provider. Leave stitches (sutures), skin glue, or adhesive strips in place. These skin closures may need to stay in place for 2 weeks or longer. If adhesive strip edges start to loosen and curl up, you may trim the loose edges. Do not remove adhesive strips completely unless your health care provider tells you to do that. Check your port insertion site every day for signs of infection. Check for: Redness,  swelling, or pain. Fluid or blood. Warmth. Pus or a bad smell.        Activity Return to your normal activities as told by your health care provider. Ask your health care provider what activities are safe for you. Do not lift anything that is heavier than 10 lb (4.5 kg), or the limit that you are told, until your health care provider says that it is safe. General instructions Take over-the-counter and prescription medicines only as told by your health care provider. Do not take baths, swim, or use a hot tub until your health care provider approves. Ask your health care provider if you may take showers. You may only be allowed to take sponge baths. Do not drive for 24 hours if you were given a sedative during your procedure. Wear a medical alert bracelet in case of an emergency. This will tell any health care providers that you have a port. Keep all follow-up visits as told by your health care provider. This is important. Contact a health care provider if: You cannot flush your port with saline as directed, or you cannot draw blood from the port. You have a fever or chills. You have redness, swelling, or pain around your port insertion site. You have fluid or blood coming from your port insertion site. Your port insertion site feels warm to the touch. You have pus or a bad smell coming from the port insertion site. Get help right away if: You have chest pain or shortness of breath. You have bleeding from  your port that you cannot control. Summary Take care of the port as told by your health care provider. Keep the manufacturer's information card with you at all times. Change your dressing as told by your health care provider. Contact a health care provider if you have a fever or chills or if you have redness, swelling, or pain around your port insertion site. Keep all follow-up visits as told by your health care provider. This information is not intended to replace advice given to you  by your health care provider. Make sure you discuss any questions you have with your health care provider. Document Revised: 09/05/2017 Document Reviewed: 09/05/2017 Elsevier Patient Education  [place].

## 2021-08-17 ENCOUNTER — Other Ambulatory Visit: Payer: Self-pay

## 2021-08-17 ENCOUNTER — Encounter: Payer: Self-pay | Admitting: *Deleted

## 2021-08-17 ENCOUNTER — Inpatient Hospital Stay: Payer: BC Managed Care – PPO

## 2021-08-17 ENCOUNTER — Other Ambulatory Visit: Payer: BC Managed Care – PPO

## 2021-08-17 ENCOUNTER — Inpatient Hospital Stay (HOSPITAL_BASED_OUTPATIENT_CLINIC_OR_DEPARTMENT_OTHER): Payer: BC Managed Care – PPO | Admitting: Internal Medicine

## 2021-08-17 VITALS — BP 159/83 | HR 71 | Temp 97.5°F | Resp 15 | Wt 97.8 lb

## 2021-08-17 DIAGNOSIS — E876 Hypokalemia: Secondary | ICD-10-CM | POA: Diagnosis not present

## 2021-08-17 DIAGNOSIS — R531 Weakness: Secondary | ICD-10-CM | POA: Diagnosis not present

## 2021-08-17 DIAGNOSIS — R197 Diarrhea, unspecified: Secondary | ICD-10-CM | POA: Diagnosis not present

## 2021-08-17 DIAGNOSIS — R5383 Other fatigue: Secondary | ICD-10-CM | POA: Diagnosis not present

## 2021-08-17 DIAGNOSIS — C3491 Malignant neoplasm of unspecified part of right bronchus or lung: Secondary | ICD-10-CM | POA: Diagnosis not present

## 2021-08-17 DIAGNOSIS — Z5112 Encounter for antineoplastic immunotherapy: Secondary | ICD-10-CM | POA: Diagnosis not present

## 2021-08-17 DIAGNOSIS — R140 Abdominal distension (gaseous): Secondary | ICD-10-CM | POA: Diagnosis not present

## 2021-08-17 DIAGNOSIS — Z5111 Encounter for antineoplastic chemotherapy: Secondary | ICD-10-CM | POA: Diagnosis not present

## 2021-08-17 DIAGNOSIS — Z79899 Other long term (current) drug therapy: Secondary | ICD-10-CM | POA: Diagnosis not present

## 2021-08-17 DIAGNOSIS — K5900 Constipation, unspecified: Secondary | ICD-10-CM | POA: Diagnosis not present

## 2021-08-17 DIAGNOSIS — Z95828 Presence of other vascular implants and grafts: Secondary | ICD-10-CM | POA: Insufficient documentation

## 2021-08-17 DIAGNOSIS — C3411 Malignant neoplasm of upper lobe, right bronchus or lung: Secondary | ICD-10-CM | POA: Diagnosis not present

## 2021-08-17 DIAGNOSIS — E538 Deficiency of other specified B group vitamins: Secondary | ICD-10-CM | POA: Diagnosis not present

## 2021-08-17 LAB — CBC WITH DIFFERENTIAL (CANCER CENTER ONLY)
Abs Immature Granulocytes: 0.02 10*3/uL (ref 0.00–0.07)
Basophils Absolute: 0 10*3/uL (ref 0.0–0.1)
Basophils Relative: 1 %
Eosinophils Absolute: 0.2 10*3/uL (ref 0.0–0.5)
Eosinophils Relative: 5 %
HCT: 51.1 % — ABNORMAL HIGH (ref 36.0–46.0)
Hemoglobin: 18.1 g/dL — ABNORMAL HIGH (ref 12.0–15.0)
Immature Granulocytes: 1 %
Lymphocytes Relative: 44 %
Lymphs Abs: 1.8 10*3/uL (ref 0.7–4.0)
MCH: 35.9 pg — ABNORMAL HIGH (ref 26.0–34.0)
MCHC: 35.4 g/dL (ref 30.0–36.0)
MCV: 101.4 fL — ABNORMAL HIGH (ref 80.0–100.0)
Monocytes Absolute: 0.2 10*3/uL (ref 0.1–1.0)
Monocytes Relative: 4 %
Neutro Abs: 1.9 10*3/uL (ref 1.7–7.7)
Neutrophils Relative %: 45 %
Platelet Count: 151 10*3/uL (ref 150–400)
RBC: 5.04 MIL/uL (ref 3.87–5.11)
RDW: 15.6 % — ABNORMAL HIGH (ref 11.5–15.5)
WBC Count: 4.1 10*3/uL (ref 4.0–10.5)
nRBC: 0 % (ref 0.0–0.2)

## 2021-08-17 LAB — CMP (CANCER CENTER ONLY)
ALT: 14 U/L (ref 0–44)
AST: 16 U/L (ref 15–41)
Albumin: 3.8 g/dL (ref 3.5–5.0)
Alkaline Phosphatase: 155 U/L — ABNORMAL HIGH (ref 38–126)
Anion gap: 6 (ref 5–15)
BUN: 14 mg/dL (ref 8–23)
CO2: 27 mmol/L (ref 22–32)
Calcium: 9.7 mg/dL (ref 8.9–10.3)
Chloride: 100 mmol/L (ref 98–111)
Creatinine: 0.51 mg/dL (ref 0.44–1.00)
GFR, Estimated: >60 mL/min (ref >60)
Glucose, Bld: 91 mg/dL (ref 70–99)
Potassium: 3.9 mmol/L (ref 3.5–5.1)
Sodium: 133 mmol/L — ABNORMAL LOW (ref 135–145)
Total Bilirubin: 0.6 mg/dL (ref 0.3–1.2)
Total Protein: 6.9 g/dL (ref 6.5–8.1)

## 2021-08-17 MED ORDER — HEPARIN SOD (PORK) LOCK FLUSH 100 UNIT/ML IV SOLN
500.0000 [IU] | Freq: Once | INTRAVENOUS | Status: AC
  Administered 2021-08-17: 500 [IU]

## 2021-08-17 MED ORDER — SODIUM CHLORIDE 0.9% FLUSH
10.0000 mL | Freq: Once | INTRAVENOUS | Status: AC
  Administered 2021-08-17: 10 mL

## 2021-08-17 NOTE — Progress Notes (Signed)
I followed up on Sue Chase's treatment plan and schedule. Per Dr. Asa Lente recommendation's patient is schedule for her second cycle in 2 weeks at this time.

## 2021-08-20 ENCOUNTER — Telehealth: Payer: Self-pay | Admitting: Physician Assistant

## 2021-08-20 ENCOUNTER — Telehealth: Payer: Self-pay | Admitting: Medical Oncology

## 2021-08-20 NOTE — Telephone Encounter (Signed)
err

## 2021-08-20 NOTE — Telephone Encounter (Signed)
The patient called reporting a small knot near a prior IV site.  She states she is not that concerned about it but has never had this before and wanted to ensure that it is nothing serious.  No systemic symptoms.  No drainage or wounds. She does feel like this is improving today.  Discussed with the patient that she likely has thrombophlebitis.  Encouraged her to use heating pads and Tylenol if needed.  Of note, the patient is on eliquis. Discussed it may take some time for this to go away but cautioned that we would want to see her for evaluation if she develops any new or worsening symptoms such as fever, swelling in her extremity, drainage, warmth, and erythema.  She expressed understanding with the instructions.

## 2021-08-23 ENCOUNTER — Other Ambulatory Visit: Payer: Self-pay

## 2021-08-23 ENCOUNTER — Inpatient Hospital Stay: Payer: BC Managed Care – PPO | Attending: Oncology

## 2021-08-23 ENCOUNTER — Other Ambulatory Visit: Payer: BC Managed Care – PPO

## 2021-08-23 DIAGNOSIS — C3491 Malignant neoplasm of unspecified part of right bronchus or lung: Secondary | ICD-10-CM

## 2021-08-23 DIAGNOSIS — Z5111 Encounter for antineoplastic chemotherapy: Secondary | ICD-10-CM | POA: Insufficient documentation

## 2021-08-23 DIAGNOSIS — C3411 Malignant neoplasm of upper lobe, right bronchus or lung: Secondary | ICD-10-CM | POA: Diagnosis not present

## 2021-08-23 DIAGNOSIS — Z95828 Presence of other vascular implants and grafts: Secondary | ICD-10-CM

## 2021-08-23 DIAGNOSIS — Z5112 Encounter for antineoplastic immunotherapy: Secondary | ICD-10-CM | POA: Insufficient documentation

## 2021-08-23 DIAGNOSIS — Z79899 Other long term (current) drug therapy: Secondary | ICD-10-CM | POA: Insufficient documentation

## 2021-08-23 LAB — CBC WITH DIFFERENTIAL (CANCER CENTER ONLY)
Abs Immature Granulocytes: 0.01 10*3/uL (ref 0.00–0.07)
Basophils Absolute: 0 10*3/uL (ref 0.0–0.1)
Basophils Relative: 1 %
Eosinophils Absolute: 0.3 10*3/uL (ref 0.0–0.5)
Eosinophils Relative: 7 %
HCT: 46.9 % — ABNORMAL HIGH (ref 36.0–46.0)
Hemoglobin: 16.5 g/dL — ABNORMAL HIGH (ref 12.0–15.0)
Immature Granulocytes: 0 %
Lymphocytes Relative: 58 %
Lymphs Abs: 2.2 10*3/uL (ref 0.7–4.0)
MCH: 35.4 pg — ABNORMAL HIGH (ref 26.0–34.0)
MCHC: 35.2 g/dL (ref 30.0–36.0)
MCV: 100.6 fL — ABNORMAL HIGH (ref 80.0–100.0)
Monocytes Absolute: 0.5 10*3/uL (ref 0.1–1.0)
Monocytes Relative: 15 %
Neutro Abs: 0.7 10*3/uL — ABNORMAL LOW (ref 1.7–7.7)
Neutrophils Relative %: 19 %
Platelet Count: 39 10*3/uL — ABNORMAL LOW (ref 150–400)
RBC: 4.66 MIL/uL (ref 3.87–5.11)
RDW: 15.2 % (ref 11.5–15.5)
Smear Review: NORMAL
WBC Count: 3.7 10*3/uL — ABNORMAL LOW (ref 4.0–10.5)
nRBC: 0 % (ref 0.0–0.2)

## 2021-08-23 LAB — CMP (CANCER CENTER ONLY)
ALT: 34 U/L (ref 0–44)
AST: 18 U/L (ref 15–41)
Albumin: 3.7 g/dL (ref 3.5–5.0)
Alkaline Phosphatase: 270 U/L — ABNORMAL HIGH (ref 38–126)
Anion gap: 6 (ref 5–15)
BUN: 12 mg/dL (ref 8–23)
CO2: 28 mmol/L (ref 22–32)
Calcium: 9.5 mg/dL (ref 8.9–10.3)
Chloride: 104 mmol/L (ref 98–111)
Creatinine: 0.51 mg/dL (ref 0.44–1.00)
GFR, Estimated: >60 mL/min (ref >60)
Glucose, Bld: 81 mg/dL (ref 70–99)
Potassium: 3.8 mmol/L (ref 3.5–5.1)
Sodium: 138 mmol/L (ref 135–145)
Total Bilirubin: 0.7 mg/dL (ref 0.3–1.2)
Total Protein: 7 g/dL (ref 6.5–8.1)

## 2021-08-23 LAB — TSH: TSH: 1.644 u[IU]/mL (ref 0.350–4.500)

## 2021-08-23 MED ORDER — SODIUM CHLORIDE 0.9% FLUSH
10.0000 mL | Freq: Once | INTRAVENOUS | Status: AC
  Administered 2021-08-23: 10 mL

## 2021-08-23 MED ORDER — HEPARIN SOD (PORK) LOCK FLUSH 100 UNIT/ML IV SOLN
500.0000 [IU] | Freq: Once | INTRAVENOUS | Status: AC
  Administered 2021-08-23: 500 [IU]

## 2021-08-25 NOTE — Progress Notes (Signed)
[facility] OFFICE PROGRESS NOTE  [name], NP [place] [address]  DIAGNOSIS: Stage IV (T2a, N0, M1a) non-small cell lung cancer, adenocarcinoma presented with multifocal bilateral pulmonary nodules involving the right upper lobe as well as the smaller bilateral groundglass opacities diagnosed in April 2023.  PD-L1 expression is 4%.  Molecular studies by [facility] 360 tissue test showed positive KRAS G12C mutation but the blood test failed secondary to insufficient circulating tumor DNA.  PRIOR THERAPY: None  CURRENT THERAPY:  Systemic chemotherapy with carboplatin for AUC of 5, Alimta 500 Mg/M2 and Keytruda 200 Mg IV every 3 weeks.  First dose 08/10/2021.  Status post 1 cycle.   INTERVAL HISTORY: Sue Chase 66 y.o. female returns to the clinic today for a follow-up visit accompanied by her daughter.  The patient was recently diagnosed with stage IV multifocal adenocarcinoma.  She is status post 1 cycle of treatment and tolerated it fairly well.  In the interval she developed a small knot at the IV site for her Port-A-Cath placement. I had spoken to her on the phone prior to this appointment and she noted the soreness and firmness has improved.  Today the patient denies any fever, chills, or unexplained weight loss.  She reports that she "always sweats a little" but denies any changes in her night sweats recently.  She gained 3 pounds since last being seen.  She is expected to see a member the nutritionist team while in the infusion room today.  She denies any chest pain, shortness of breath,  or hemoptysis.  She reports a intermittent cough but nothing out of the ordinary.  She denies any nausea, vomiting, diarrhea, or constipation.  Denies any headaches.  She reports she sometimes has itchy eyes.  She also reports she has some dark moles on her left cheek, left nose, and right cheek and right ear that are changing.  She has not seen a  dermatologist.  She is here today for evaluation and repeat blood work before undergoing cycle #2.    MEDICAL HISTORY: Past Medical History:  Diagnosis Date   Colon polyps    Complication of anesthesia    tolerated propofol but other meds made her emotional / cry/ difficulty breathing/ panic   Hyperlipidemia    Rheumatic fever    Seasonal allergies    UTI (urinary tract infection)     ALLERGIES:  is allergic to codeine.  MEDICATIONS:  Current Outpatient Medications  Medication Sig Dispense Refill   albuterol (VENTOLIN HFA) 108 (90 Base) MCG/ACT inhaler Inhale 2 puffs into the lungs every 6 (six) hours as needed for wheezing or shortness of breath. 8 g 1   folic acid (FOLVITE) 1 MG tablet Take 1 tablet (1 mg total) by mouth daily. 30 tablet 4   lidocaine-prilocaine (EMLA) cream Apply to the Port-A-Cath site 30 minutes before chemotherapy 30 g 0   magnesium oxide (MAG-OX) 400 (240 Mg) MG tablet Take 400 mg by mouth daily.     potassium chloride SA (KLOR-CON M) 20 MEQ tablet Take 1 tablet (20 mEq total) by mouth daily. 90 tablet 3   apixaban (ELIQUIS) 5 MG TABS tablet Take 1 tablet (5 mg total) by mouth 2 (two) times daily. 180 tablet 1   mirtazapine (REMERON) 15 MG tablet Take 2 tablets (30 mg total) by mouth at bedtime. (Patient not taking: Reported on 08/30/2021) 30 tablet 2   nicotine (NICODERM CQ - DOSED IN MG/24 HOURS) 14 mg/24hr patch  Place 1 patch (14 mg total) onto the skin daily. (Patient not taking: Reported on 06/22/2021) 28 patch 0   omeprazole (PRILOSEC) 20 MG capsule Take 1 capsule (20 mg total) by mouth daily. (Patient not taking: Reported on 07/06/2021) 30 capsule 3   potassium chloride SA (KLOR-CON M) 20 MEQ tablet Take 1 tablet (20 mEq total) by mouth daily. 10 tablet 0   prochlorperazine (COMPAZINE) 10 MG tablet Take 1 tablet (10 mg total) by mouth every 6 (six) hours as needed for nausea or vomiting. (Patient not taking: Reported on 08/30/2021) 30 tablet 0   No current  facility-administered medications for this visit.    SURGICAL HISTORY:  Past Surgical History:  Procedure Laterality Date   BRONCHIAL BIOPSY  06/08/2021   Procedure: BRONCHIAL BIOPSIES;  Surgeon: Garner Nash, DO;  Location: [facility];  Service: Pulmonary;;   BRONCHIAL BRUSHINGS  06/08/2021   Procedure: BRONCHIAL BRUSHINGS;  Surgeon: Garner Nash, DO;  Location: [facility];  Service: Pulmonary;;   BRONCHIAL NEEDLE ASPIRATION BIOPSY  06/08/2021   Procedure: BRONCHIAL NEEDLE ASPIRATION BIOPSIES;  Surgeon: Garner Nash, DO;  Location: [facility];  Service: Pulmonary;;   COLONOSCOPY  2018   IR IMAGING GUIDED PORT INSERTION  08/12/2021   SALIVARY GLAND SURGERY     LATE 80S   TUBAL LIGATION  1986   VIDEO BRONCHOSCOPY WITH RADIAL ENDOBRONCHIAL ULTRASOUND  06/08/2021   Procedure: VIDEO BRONCHOSCOPY WITH RADIAL ENDOBRONCHIAL ULTRASOUND;  Surgeon: Garner Nash, DO;  Location: [facility];  Service: Pulmonary;;    REVIEW OF SYSTEMS:   Review of Systems  Constitutional: Negative for appetite change, chills, fatigue, fever and unexpected weight change.  HENT: Negative for mouth sores, nosebleeds, sore throat and trouble swallowing.   Eyes: Negative for eye problems and icterus.  Respiratory: Negative for cough, hemoptysis, shortness of breath and wheezing.   Cardiovascular: Negative for chest pain and leg swelling.  Gastrointestinal: Negative for abdominal pain, constipation, diarrhea, nausea and vomiting.  Genitourinary: Negative for bladder incontinence, difficulty urinating, dysuria, frequency and hematuria.   Musculoskeletal: Negative for back pain, gait problem, neck pain and neck stiffness.  Skin: Positive for dark plaques with scaling on her face, nose, ear, and right cheek. Neurological: Negative for dizziness, extremity weakness, gait problem, headaches, light-headedness and seizures.  Hematological: Negative for adenopathy. Does not bruise/bleed easily.   Psychiatric/Behavioral: Negative for confusion, depression and sleep disturbance. The patient is not nervous/anxious.     PHYSICAL EXAMINATION:  Blood pressure (!) 151/92, pulse 71, temperature (!) 97.5 F (36.4 C), temperature source Oral, resp. rate 16, height '5\' 3"'  (1.6 m), weight 100 lb 9.6 oz (45.6 kg), SpO2 100 %.  ECOG PERFORMANCE STATUS: 1  Physical Exam  Constitutional: Oriented to person, place, and time and well-developed, well-nourished, and in no distress.  HENT:  Head: Normocephalic and atraumatic.  Mouth/Throat: Oropharynx is clear and moist. No oropharyngeal exudate.  Eyes: Conjunctivae are normal. Right eye exhibits no discharge. Left eye exhibits no discharge. No scleral icterus.  Neck: Normal range of motion. Neck supple.  Cardiovascular: Normal rate, regular rhythm, normal heart sounds and intact distal pulses.   Pulmonary/Chest: Effort normal and breath sounds normal. No respiratory distress. No wheezes. No rales.  Abdominal: Soft. Bowel sounds are normal. Exhibits no distension and no mass. There is no tenderness.  Musculoskeletal: Normal range of motion. Exhibits no edema.  Lymphadenopathy:    No cervical adenopathy.  Neurological: Alert and oriented to person, place, and time. Exhibits normal muscle tone.  Gait normal. Coordination normal.  Skin: Positive for dark plaques with scaling on her left cheek, right cheek, nose, and right ear.  Skin is warm and dry. No rash noted. Not diaphoretic. No erythema. No pallor.  Psychiatric: Mood, memory and judgment normal.  Vitals reviewed.  LABORATORY DATA: Lab Results  Component Value Date   WBC 4.8 08/30/2021   HGB 16.1 (H) 08/30/2021   HCT 45.3 08/30/2021   MCV 100.0 08/30/2021   PLT 162 08/30/2021      Chemistry      Component Value Date/Time   NA 138 08/23/2021 1323   K 3.8 08/23/2021 1323   CL 104 08/23/2021 1323   CO2 28 08/23/2021 1323   BUN 12 08/23/2021 1323   CREATININE 0.51 08/23/2021 1323       Component Value Date/Time   CALCIUM 9.5 08/23/2021 1323   ALKPHOS 270 (H) 08/23/2021 1323   AST 18 08/23/2021 1323   ALT 34 08/23/2021 1323   BILITOT 0.7 08/23/2021 1323       RADIOGRAPHIC STUDIES:  IR IMAGING GUIDED PORT INSERTION  Result Date: 08/12/2021 INDICATION: 66 year old female with advanced stage lung cancer requiring central venous access for chemotherapy administration. EXAM: IMPLANTED PORT A CATH PLACEMENT WITH ULTRASOUND AND FLUOROSCOPIC GUIDANCE COMPARISON:  None Available. MEDICATIONS: None. ANESTHESIA/SEDATION: Moderate (conscious) sedation was employed during this procedure. A total of Versed 0 mg and Fentanyl 100 mcg was administered intravenously. Moderate Sedation Time: 0 minutes. The patient's level of consciousness and vital signs were monitored continuously by radiology nursing throughout the procedure under my direct supervision. CONTRAST:  None FLUOROSCOPY TIME:  6.29 mGy COMPLICATIONS: None immediate. PROCEDURE: The procedure, risks, benefits, and alternatives were explained to the patient. Questions regarding the procedure were encouraged and answered. The patient understands and consents to the procedure. The right neck and chest were prepped with chlorhexidine in a sterile fashion, and a sterile drape was applied covering the operative field. Maximum barrier sterile technique with sterile gowns and gloves were used for the procedure. A timeout was performed prior to the initiation of the procedure. Ultrasound was used to examine the jugular vein which was compressible and free of internal echoes. A skin marker was used to demarcate the planned venotomy and port pocket incision sites. Local anesthesia was provided to these sites and the subcutaneous tunnel track with 1% lidocaine with 1:100,000 epinephrine. A small incision was created at the jugular access site and blunt dissection was performed of the subcutaneous tissues. Under ultrasound guidance, the jugular vein was  accessed with a 21 ga micropuncture needle and an 0.018" wire was inserted to the superior vena cava. Real-time ultrasound guidance was utilized for vascular access including the acquisition of a permanent ultrasound image documenting patency of the accessed vessel. A 5 Fr micopuncture set was then used, through which a 0.035" Rosen wire was passed under fluoroscopic guidance into the inferior vena cava. An 8 Fr dilator was then placed over the wire. A subcutaneous port pocket was then created along the upper chest wall utilizing a combination of sharp and blunt dissection. The pocket was irrigated with sterile saline, packed with gauze, and observed for hemorrhage. A single lumen slim sized power injectable port was chosen for placement. The 8 Fr catheter was tunneled from the port pocket site to the venotomy incision. The port was placed in the pocket. The external catheter was trimmed to appropriate length. The dilator was exchanged for an 8 Fr peel-away sheath under fluoroscopic guidance. The catheter was then  placed through the sheath and the sheath was removed. Final catheter positioning was confirmed and documented with a fluoroscopic spot radiograph. The port was accessed with a Huber needle, aspirated, and flushed with heparinized saline. The deep dermal layer of the port pocket incision was closed with interrupted 3-0 Vicryl suture. The skin was opposed with a running subcuticular 4-0 Monocryl suture. Dermabond was then placed over the port pocket and neck incisions. The patient tolerated the procedure well without immediate post procedural complication. FINDINGS: After catheter placement, the tip lies within the superior cavoatrial junction. The catheter aspirates and flushes normally and is ready for immediate use. IMPRESSION: Successful placement of a power injectable Port-A-Cath via the right internal jugular vein. The catheter is ready for immediate use. Ruthann Cancer, MD [facility] Radiology Electronically Signed   By: Ruthann Cancer M.D.   On: 08/12/2021 16:47     ASSESSMENT/PLAN:  This very pleasant 66 year old Caucasian female diagnosed with stage IV (T2 a, N0, M1 a) non-small cell lung cancer, adenocarcinoma.  The patient presented multifocal bilateral pulmonary nodules involving the right upper lobe as well as small bilateral groundglass opacities.  She was diagnosed in April 2023.  She is positive for a K-ras G12 C mutation and her PD-L1 expression of 4%.  Her K-ras G12 C mutation can be targeted in the second line setting in the future.  The patient is currently undergoing systemic chemotherapy with carboplatin for an AUC of 5, Alimta 500 mg per metered square, Keytruda 200 mg IV every 3 weeks.  She is status post 1 cycle.  She tolerated fairly well.  I reviewed the patient's labs with Dr. Julien Nordmann today.  Her ANC was 0.7 last week which has recovered to 1.6.  Her platelet count was also 39,000 last week and recovered to 162k today.  The patient confirms she is taking her folic acid as prescribed.  Per Dr. Julien Nordmann, she can proceed with cycle #2 today at the same dose.  Reviewed the patient's lab work with her.  Discussed that we will continue to monitor her labs closely on a weekly basis.  I did give her the option to wait in the lobby 10 minutes on her weekly lab appointment days to ensure her labs are acceptable before leaving the clinic to make sure no intervention is needed.  Discussed if her Gonzales got below parameters we may call her back for injection to increase her white blood cell count such as Zarxio or Granix.  If her platelet count drop below 50,000, we may call her and ask her to hold her Eliquis.  We will see her back for follow-up visit in 3 weeks for evaluation and repeat blood work before undergoing cycle #3.  The patient has a few dark scaly plaques on her face.  She is concerned because these are changing/evolving.  Discussed  this is likely not related to her treatment.  Some of these lesions seem most consistent with severe keratosis or actinic keratosis but we will refer her to dermatology for further evaluation and management and if any type of removal is indicated.  For the patient's occasional itchy eyes, I encouraged her to use antihistamine over-the-counter eyedrops if needed.  Encouraged her to follow-up with her family care provider if no significant relief.  We will provide her with a handout of potassium rich food as her potassium is slightly low today.   The patient was advised to call immediately if she has any concerning  symptoms in the interval. The patient voices understanding of current disease status and treatment options and is in agreement with the current care plan. All questions were answered. The patient knows to call the clinic with any problems, questions or concerns. We can certainly see the patient much sooner if necessary      Orders Placed This Encounter  Procedures   Ambulatory referral to Dermatology    Referral Priority:   Routine    Referral Type:   Consultation    Referral Reason:   Specialty Services Required    Requested Specialty:   Dermatology    Number of Visits Requested:   1     The total time spent in the appointment was 20-29 minutes.   Newel Oien L Ramon Brant, PA-C 08/30/21

## 2021-08-27 MED FILL — Dexamethasone Sodium Phosphate Inj 100 MG/10ML: INTRAMUSCULAR | Qty: 1 | Status: AC

## 2021-08-27 MED FILL — Fosaprepitant Dimeglumine For IV Infusion 150 MG (Base Eq): INTRAVENOUS | Qty: 5 | Status: AC

## 2021-08-30 ENCOUNTER — Inpatient Hospital Stay (HOSPITAL_BASED_OUTPATIENT_CLINIC_OR_DEPARTMENT_OTHER): Payer: BC Managed Care – PPO | Admitting: Physician Assistant

## 2021-08-30 ENCOUNTER — Inpatient Hospital Stay: Payer: BC Managed Care – PPO

## 2021-08-30 ENCOUNTER — Other Ambulatory Visit: Payer: BC Managed Care – PPO

## 2021-08-30 ENCOUNTER — Other Ambulatory Visit: Payer: Self-pay

## 2021-08-30 ENCOUNTER — Inpatient Hospital Stay: Payer: BC Managed Care – PPO | Admitting: Nutrition

## 2021-08-30 VITALS — BP 151/92 | HR 71 | Temp 97.5°F | Resp 16 | Ht 63.0 in | Wt 100.6 lb

## 2021-08-30 DIAGNOSIS — C3491 Malignant neoplasm of unspecified part of right bronchus or lung: Secondary | ICD-10-CM

## 2021-08-30 DIAGNOSIS — C3411 Malignant neoplasm of upper lobe, right bronchus or lung: Secondary | ICD-10-CM | POA: Diagnosis not present

## 2021-08-30 DIAGNOSIS — Z5112 Encounter for antineoplastic immunotherapy: Secondary | ICD-10-CM | POA: Diagnosis not present

## 2021-08-30 DIAGNOSIS — Z95828 Presence of other vascular implants and grafts: Secondary | ICD-10-CM

## 2021-08-30 DIAGNOSIS — Z5111 Encounter for antineoplastic chemotherapy: Secondary | ICD-10-CM | POA: Diagnosis not present

## 2021-08-30 DIAGNOSIS — Z79899 Other long term (current) drug therapy: Secondary | ICD-10-CM | POA: Diagnosis not present

## 2021-08-30 DIAGNOSIS — L989 Disorder of the skin and subcutaneous tissue, unspecified: Secondary | ICD-10-CM

## 2021-08-30 LAB — CMP (CANCER CENTER ONLY)
ALT: 11 U/L (ref 0–44)
AST: 14 U/L — ABNORMAL LOW (ref 15–41)
Albumin: 3.7 g/dL (ref 3.5–5.0)
Alkaline Phosphatase: 140 U/L — ABNORMAL HIGH (ref 38–126)
Anion gap: 6 (ref 5–15)
BUN: 8 mg/dL (ref 8–23)
CO2: 29 mmol/L (ref 22–32)
Calcium: 9.4 mg/dL (ref 8.9–10.3)
Chloride: 104 mmol/L (ref 98–111)
Creatinine: 0.53 mg/dL (ref 0.44–1.00)
GFR, Estimated: >60 mL/min (ref >60)
Glucose, Bld: 85 mg/dL (ref 70–99)
Potassium: 3.3 mmol/L — ABNORMAL LOW (ref 3.5–5.1)
Sodium: 139 mmol/L (ref 135–145)
Total Bilirubin: 0.5 mg/dL (ref 0.3–1.2)
Total Protein: 7.1 g/dL (ref 6.5–8.1)

## 2021-08-30 LAB — CBC WITH DIFFERENTIAL (CANCER CENTER ONLY)
Abs Immature Granulocytes: 0.01 10*3/uL (ref 0.00–0.07)
Basophils Absolute: 0 10*3/uL (ref 0.0–0.1)
Basophils Relative: 1 %
Eosinophils Absolute: 0.5 10*3/uL (ref 0.0–0.5)
Eosinophils Relative: 9 %
HCT: 45.3 % (ref 36.0–46.0)
Hemoglobin: 16.1 g/dL — ABNORMAL HIGH (ref 12.0–15.0)
Immature Granulocytes: 0 %
Lymphocytes Relative: 39 %
Lymphs Abs: 1.9 10*3/uL (ref 0.7–4.0)
MCH: 35.5 pg — ABNORMAL HIGH (ref 26.0–34.0)
MCHC: 35.5 g/dL (ref 30.0–36.0)
MCV: 100 fL (ref 80.0–100.0)
Monocytes Absolute: 0.8 10*3/uL (ref 0.1–1.0)
Monocytes Relative: 17 %
Neutro Abs: 1.6 10*3/uL — ABNORMAL LOW (ref 1.7–7.7)
Neutrophils Relative %: 34 %
Platelet Count: 162 10*3/uL (ref 150–400)
RBC: 4.53 MIL/uL (ref 3.87–5.11)
RDW: 15.2 % (ref 11.5–15.5)
WBC Count: 4.8 10*3/uL (ref 4.0–10.5)
nRBC: 0 % (ref 0.0–0.2)

## 2021-08-30 MED ORDER — SODIUM CHLORIDE 0.9% FLUSH
10.0000 mL | INTRAVENOUS | Status: DC | PRN
  Administered 2021-08-30: 10 mL

## 2021-08-30 MED ORDER — SODIUM CHLORIDE 0.9 % IV SOLN
150.0000 mg | Freq: Once | INTRAVENOUS | Status: AC
  Administered 2021-08-30: 150 mg via INTRAVENOUS
  Filled 2021-08-30: qty 150

## 2021-08-30 MED ORDER — PALONOSETRON HCL INJECTION 0.25 MG/5ML: 0.2500 mg | Freq: Once | INTRAVENOUS | Status: DC

## 2021-08-30 MED ORDER — SODIUM CHLORIDE 0.9 % IV SOLN
330.0000 mg | Freq: Once | INTRAVENOUS | Status: AC
  Administered 2021-08-30: 330 mg via INTRAVENOUS
  Filled 2021-08-30: qty 33

## 2021-08-30 MED ORDER — SODIUM CHLORIDE 0.9 % IV SOLN
10.0000 mg | Freq: Once | INTRAVENOUS | Status: AC
  Administered 2021-08-30: 10 mg via INTRAVENOUS
  Filled 2021-08-30: qty 10

## 2021-08-30 MED ORDER — HEPARIN SOD (PORK) LOCK FLUSH 100 UNIT/ML IV SOLN
500.0000 [IU] | Freq: Once | INTRAVENOUS | Status: AC | PRN
  Administered 2021-08-30: 500 [IU]

## 2021-08-30 MED ORDER — PALONOSETRON HCL INJECTION 0.25 MG/5ML
0.2500 mg | Freq: Once | INTRAVENOUS | Status: AC
  Administered 2021-08-30: 0.25 mg via INTRAVENOUS
  Filled 2021-08-30: qty 5

## 2021-08-30 MED ORDER — SODIUM CHLORIDE 0.9% FLUSH
10.0000 mL | Freq: Once | INTRAVENOUS | Status: AC
  Administered 2021-08-30: 10 mL

## 2021-08-30 MED ORDER — SODIUM CHLORIDE 0.9 % IV SOLN
200.0000 mg | Freq: Once | INTRAVENOUS | Status: AC
  Administered 2021-08-30: 200 mg via INTRAVENOUS
  Filled 2021-08-30: qty 200

## 2021-08-30 MED ORDER — SODIUM CHLORIDE 0.9 % IV SOLN
500.0000 mg/m2 | Freq: Once | INTRAVENOUS | Status: AC
  Administered 2021-08-30: 700 mg via INTRAVENOUS
  Filled 2021-08-30: qty 20

## 2021-08-30 MED ORDER — SODIUM CHLORIDE 0.9 % IV SOLN: Freq: Once | INTRAVENOUS | Status: AC

## 2021-08-30 NOTE — Progress Notes (Signed)
66 year old female diagnosed with stage IV lung cancer and followed by Dr. Julien Nordmann.  She is receiving carboplatin, Alimta, and Keytruda every 3 weeks.  Past medical history includes hyperlipidemia and UTI.  Medications include Folvite, magnesium oxide, Remeron, Prilosec, and Compazine.  Labs include potassium 3.3.  Height: 5 feet 3 inches. Weight: 100 pounds 10 ounces. Usual body weight: Patient weighed 103 pounds May 2. BMI: 17.83.  Patient states she has never been a big eater.  She is within her usual body weight.  She typically eats 2 meals a day and snacks on things like cheese and crackers, nuts, and fruit.  For breakfast she generally has a bowl of cereal or a granola bar.  At lunch or dinner she will eat a hamburger or some other foods she can purchase from a restaurant.  She reports she does not cook a lot.  She had 1 episode of nausea however she did not have to take nausea medication.  She denies problems with her bowels.  Nutrition diagnosis: Food and nutrition related knowledge deficit related to cancer and associated treatments as evidenced by no prior need for nutrition related information.  Intervention: Patient educated on the importance of smaller more frequent meals and snacks with adequate calories and protein to minimize any weight loss. Provided nutrition facts sheets on ways to increase calories and protein and appropriate snacks. Educated patient on foods high in potassium and provided in nutrition fact sheet. Questions were answered.  Teach back method used.  Contact information provided.  Monitoring, evaluation, goals: Patient will tolerate adequate calories and protein to minimize weight loss.  Next visit: Tuesday, August 1 during infusion.  **Disclaimer: This note was dictated with voice recognition software. Similar sounding words can inadvertently be transcribed and this note may contain transcription errors which may not have been corrected upon publication  of note.**

## 2021-08-30 NOTE — Patient Instructions (Signed)
[facility] ONCOLOGY  Discharge Instructions: Thank you for choosing [facility] to provide your oncology and hematology care.   If you have a lab appointment with the [facility], please go directly to the [facility] and check in at the registration area.   Wear comfortable clothing and clothing appropriate for easy access to any Portacath or PICC line.   We strive to give you quality time with your provider. You may need to reschedule your appointment if you arrive late (15 or more minutes).  Arriving late affects you and other patients whose appointments are after yours.  Also, if you miss three or more appointments without notifying the office, you may be dismissed from the clinic at the provider's discretion.      For prescription refill requests, have your pharmacy contact our office and allow 72 hours for refills to be completed.    Today you received the following chemotherapy and/or immunotherapy agents : Keytruda, Alimta, Carboplatin      To help prevent nausea and vomiting after your treatment, we encourage you to take your nausea medication as directed.  BELOW ARE SYMPTOMS THAT SHOULD BE REPORTED IMMEDIATELY: *FEVER GREATER THAN 100.4 F (38 C) OR HIGHER *CHILLS OR SWEATING *NAUSEA AND VOMITING THAT IS NOT CONTROLLED WITH YOUR NAUSEA MEDICATION *UNUSUAL SHORTNESS OF BREATH *UNUSUAL BRUISING OR BLEEDING *URINARY PROBLEMS (pain or burning when urinating, or frequent urination) *BOWEL PROBLEMS (unusual diarrhea, constipation, pain near the anus) TENDERNESS IN MOUTH AND THROAT WITH OR WITHOUT PRESENCE OF ULCERS (sore throat, sores in mouth, or a toothache) UNUSUAL RASH, SWELLING OR PAIN  UNUSUAL VAGINAL DISCHARGE OR ITCHING   Items with * indicate a potential emergency and should be followed up as soon as possible or go to the Emergency Department if any problems should occur.  Please show the CHEMOTHERAPY ALERT CARD or IMMUNOTHERAPY ALERT  CARD at check-in to the Emergency Department and triage nurse.  Should you have questions after your visit or need to cancel or reschedule your appointment, please contact [facility]  Dept: [phone]  and follow the prompts.  Office hours are 8:00 a.m. to 4:30 p.m. Monday - Friday. Please note that voicemails left after 4:00 p.m. may not be returned until the following business day.  We are closed weekends and major holidays. You have access to a nurse at all times for urgent questions. Please call the main number to the clinic Dept: [phone] and follow the prompts.   For any non-urgent questions, you may also contact your provider using MyChart. We now offer e-Visits for anyone 79 and older to request care online for non-urgent symptoms. For details visit [email or web address].   Also download the MyChart app! Go to the app store, search "MyChart", open the app, select [facility], and log in with your MyChart username and password.  Masks are optional in the cancer centers. If you would like for your care team to wear a mask while they are taking care of you, please let them know. For doctor visits, patients may have with them one support person who is at least 66 years old. At this time, visitors are not allowed in the infusion area. Hypokalemia Hypokalemia means that the amount of potassium in the blood is lower than normal. Potassium is a mineral (electrolyte) that helps regulate the amount of fluid in the body. It also stimulates muscle tightening (contraction) and helps nerves work properly. Normally, most of the body's potassium is  inside cells, and only a very small amount is in the blood. Because the amount in the blood is so small, minor changes to potassium levels in the blood can be life-threatening. What are the causes? This condition may be caused by: Antibiotic medicine. Diarrhea or vomiting. Taking too much of a medicine that helps you have a  bowel movement (laxative) can cause diarrhea and lead to hypokalemia. Chronic kidney disease (CKD). Medicines that help the body get rid of excess fluid (diuretics). Eating disorders, such as anorexia or bulimia. Low magnesium levels in the body. Sweating a lot. What are the signs or symptoms? Symptoms of this condition include: Weakness. Constipation. Fatigue. Muscle cramps. Mental confusion. Skipped heartbeats or irregular heartbeat (palpitations). Tingling or numbness. How is this diagnosed? This condition is diagnosed with a blood test. How is this treated? This condition may be treated by: Taking potassium supplements. Adjusting the medicines that you take. Eating more foods that contain a lot of potassium. If your potassium level is very low, you may need to get potassium through an IV and be monitored in the hospital. Follow these instructions at home: Eating and drinking  Eat a healthy diet. A healthy diet includes fresh fruits and vegetables, whole grains, healthy fats, and lean proteins. If told, eat more foods that contain a lot of potassium. These include: Nuts, such as peanuts and pistachios. Seeds, such as sunflower seeds and pumpkin seeds. Peas, lentils, and lima beans. Whole grain and bran cereals and breads. Fresh fruits and vegetables, such as apricots, avocado, bananas, cantaloupe, kiwi, oranges, tomatoes, asparagus, and potatoes. Juices, such as orange, tomato, and prune. Lean meats, including fish. Milk and milk products, such as yogurt. General instructions Take over-the-counter and prescription medicines only as told by your health care provider. This includes vitamins, natural food products, and supplements. Keep all follow-up visits. This is important. Contact a health care provider if: You have weakness that gets worse. You feel your heart pounding or racing. You vomit. You have diarrhea. You have diabetes and you have trouble keeping your blood  sugar in your target range. Get help right away if: You have chest pain. You have shortness of breath. You have vomiting or diarrhea that lasts for more than 2 days. You faint. These symptoms may be an emergency. Get help right away. Call 911. Do not wait to see if the symptoms will go away. Do not drive yourself to the hospital. Summary Hypokalemia means that the amount of potassium in the blood is lower than normal. This condition is diagnosed with a blood test. Hypokalemia may be treated by taking potassium supplements, adjusting the medicines that you take, or eating more foods that are high in potassium. If your potassium level is very low, you may need to get potassium through an IV and be monitored in the hospital. This information is not intended to replace advice given to you by your health care provider. Make sure you discuss any questions you have with your health care provider. Document Revised: 10/22/2020 Document Reviewed: 10/22/2020 Elsevier Patient Education  [place].

## 2021-09-07 ENCOUNTER — Other Ambulatory Visit: Payer: BC Managed Care – PPO

## 2021-09-07 ENCOUNTER — Telehealth: Payer: Self-pay

## 2021-09-07 ENCOUNTER — Encounter: Payer: Self-pay | Admitting: Internal Medicine

## 2021-09-07 ENCOUNTER — Other Ambulatory Visit: Payer: Self-pay

## 2021-09-07 ENCOUNTER — Inpatient Hospital Stay: Payer: BC Managed Care – PPO

## 2021-09-07 DIAGNOSIS — Z79899 Other long term (current) drug therapy: Secondary | ICD-10-CM | POA: Diagnosis not present

## 2021-09-07 DIAGNOSIS — Z95828 Presence of other vascular implants and grafts: Secondary | ICD-10-CM

## 2021-09-07 DIAGNOSIS — C3491 Malignant neoplasm of unspecified part of right bronchus or lung: Secondary | ICD-10-CM

## 2021-09-07 DIAGNOSIS — C3411 Malignant neoplasm of upper lobe, right bronchus or lung: Secondary | ICD-10-CM | POA: Diagnosis not present

## 2021-09-07 DIAGNOSIS — Z5112 Encounter for antineoplastic immunotherapy: Secondary | ICD-10-CM | POA: Diagnosis not present

## 2021-09-07 DIAGNOSIS — Z5111 Encounter for antineoplastic chemotherapy: Secondary | ICD-10-CM | POA: Diagnosis not present

## 2021-09-07 LAB — CBC WITH DIFFERENTIAL (CANCER CENTER ONLY)
Abs Immature Granulocytes: 0 10*3/uL (ref 0.00–0.07)
Basophils Absolute: 0 10*3/uL (ref 0.0–0.1)
Basophils Relative: 0 %
Eosinophils Absolute: 0.1 10*3/uL (ref 0.0–0.5)
Eosinophils Relative: 4 %
HCT: 44.5 % (ref 36.0–46.0)
Hemoglobin: 15.9 g/dL — ABNORMAL HIGH (ref 12.0–15.0)
Immature Granulocytes: 0 %
Lymphocytes Relative: 54 %
Lymphs Abs: 1.6 10*3/uL (ref 0.7–4.0)
MCH: 35.2 pg — ABNORMAL HIGH (ref 26.0–34.0)
MCHC: 35.7 g/dL (ref 30.0–36.0)
MCV: 98.5 fL (ref 80.0–100.0)
Monocytes Absolute: 0.1 10*3/uL (ref 0.1–1.0)
Monocytes Relative: 4 %
Neutro Abs: 1.1 10*3/uL — ABNORMAL LOW (ref 1.7–7.7)
Neutrophils Relative %: 38 %
Platelet Count: 143 10*3/uL — ABNORMAL LOW (ref 150–400)
RBC: 4.52 MIL/uL (ref 3.87–5.11)
RDW: 15.1 % (ref 11.5–15.5)
Smear Review: NORMAL
WBC Count: 2.8 10*3/uL — ABNORMAL LOW (ref 4.0–10.5)
nRBC: 0 % (ref 0.0–0.2)

## 2021-09-07 LAB — CMP (CANCER CENTER ONLY)
ALT: 15 U/L (ref 0–44)
AST: 20 U/L (ref 15–41)
Albumin: 4 g/dL (ref 3.5–5.0)
Alkaline Phosphatase: 136 U/L — ABNORMAL HIGH (ref 38–126)
Anion gap: 8 (ref 5–15)
BUN: 17 mg/dL (ref 8–23)
CO2: 28 mmol/L (ref 22–32)
Calcium: 9.6 mg/dL (ref 8.9–10.3)
Chloride: 101 mmol/L (ref 98–111)
Creatinine: 0.57 mg/dL (ref 0.44–1.00)
GFR, Estimated: >60 mL/min (ref >60)
Glucose, Bld: 83 mg/dL (ref 70–99)
Potassium: 3.8 mmol/L (ref 3.5–5.1)
Sodium: 137 mmol/L (ref 135–145)
Total Bilirubin: 0.7 mg/dL (ref 0.3–1.2)
Total Protein: 7.3 g/dL (ref 6.5–8.1)

## 2021-09-07 MED ORDER — SODIUM CHLORIDE 0.9% FLUSH
10.0000 mL | Freq: Once | INTRAVENOUS | Status: AC
  Administered 2021-09-07: 10 mL

## 2021-09-07 MED ORDER — HEPARIN SOD (PORK) LOCK FLUSH 100 UNIT/ML IV SOLN
500.0000 [IU] | Freq: Once | INTRAVENOUS | Status: AC
  Administered 2021-09-07: 500 [IU]

## 2021-09-07 NOTE — Telephone Encounter (Signed)
Patient returned call to discuss urinary frequency. Patient states it is better. Patient advised to contact PCP for urinalysis. Patient does not want to do that. Explained that Dr. Julien Nordmann defers urinary issues to PCP.

## 2021-09-07 NOTE — Telephone Encounter (Signed)
Patient called triage phone line to let MD know that she has had frequent urination. Attempted to contact patient. VM has not been set up. Left HIPPA compliant VM on home phone requesting call back.

## 2021-09-13 ENCOUNTER — Other Ambulatory Visit: Payer: Self-pay

## 2021-09-14 ENCOUNTER — Other Ambulatory Visit: Payer: Self-pay

## 2021-09-14 ENCOUNTER — Other Ambulatory Visit: Payer: BC Managed Care – PPO

## 2021-09-14 ENCOUNTER — Inpatient Hospital Stay: Payer: BC Managed Care – PPO

## 2021-09-14 DIAGNOSIS — Z79899 Other long term (current) drug therapy: Secondary | ICD-10-CM | POA: Diagnosis not present

## 2021-09-14 DIAGNOSIS — Z95828 Presence of other vascular implants and grafts: Secondary | ICD-10-CM

## 2021-09-14 DIAGNOSIS — C3411 Malignant neoplasm of upper lobe, right bronchus or lung: Secondary | ICD-10-CM | POA: Diagnosis not present

## 2021-09-14 DIAGNOSIS — Z5111 Encounter for antineoplastic chemotherapy: Secondary | ICD-10-CM | POA: Diagnosis not present

## 2021-09-14 DIAGNOSIS — C3491 Malignant neoplasm of unspecified part of right bronchus or lung: Secondary | ICD-10-CM

## 2021-09-14 DIAGNOSIS — Z5112 Encounter for antineoplastic immunotherapy: Secondary | ICD-10-CM | POA: Diagnosis not present

## 2021-09-14 LAB — CBC WITH DIFFERENTIAL (CANCER CENTER ONLY)
Abs Immature Granulocytes: 0.01 10*3/uL (ref 0.00–0.07)
Basophils Absolute: 0 10*3/uL (ref 0.0–0.1)
Basophils Relative: 0 %
Eosinophils Absolute: 0.1 10*3/uL (ref 0.0–0.5)
Eosinophils Relative: 3 %
HCT: 38.9 % (ref 36.0–46.0)
Hemoglobin: 13.8 g/dL (ref 12.0–15.0)
Immature Granulocytes: 0 %
Lymphocytes Relative: 65 %
Lymphs Abs: 2.1 10*3/uL (ref 0.7–4.0)
MCH: 34.3 pg — ABNORMAL HIGH (ref 26.0–34.0)
MCHC: 35.5 g/dL (ref 30.0–36.0)
MCV: 96.8 fL (ref 80.0–100.0)
Monocytes Absolute: 0.6 10*3/uL (ref 0.1–1.0)
Monocytes Relative: 18 %
Neutro Abs: 0.5 10*3/uL — ABNORMAL LOW (ref 1.7–7.7)
Neutrophils Relative %: 14 %
Platelet Count: 47 10*3/uL — ABNORMAL LOW (ref 150–400)
RBC: 4.02 MIL/uL (ref 3.87–5.11)
RDW: 15 % (ref 11.5–15.5)
Smear Review: NORMAL
WBC Count: 3.2 10*3/uL — ABNORMAL LOW (ref 4.0–10.5)
nRBC: 0 % (ref 0.0–0.2)

## 2021-09-14 LAB — TSH: TSH: 0.118 u[IU]/mL — ABNORMAL LOW (ref 0.350–4.500)

## 2021-09-14 LAB — CMP (CANCER CENTER ONLY)
ALT: 74 U/L — ABNORMAL HIGH (ref 0–44)
AST: 32 U/L (ref 15–41)
Albumin: 3.9 g/dL (ref 3.5–5.0)
Alkaline Phosphatase: 343 U/L — ABNORMAL HIGH (ref 38–126)
Anion gap: 6 (ref 5–15)
BUN: 15 mg/dL (ref 8–23)
CO2: 31 mmol/L (ref 22–32)
Calcium: 9.5 mg/dL (ref 8.9–10.3)
Chloride: 101 mmol/L (ref 98–111)
Creatinine: 0.62 mg/dL (ref 0.44–1.00)
GFR, Estimated: >60 mL/min (ref >60)
Glucose, Bld: 95 mg/dL (ref 70–99)
Potassium: 3.7 mmol/L (ref 3.5–5.1)
Sodium: 138 mmol/L (ref 135–145)
Total Bilirubin: 0.7 mg/dL (ref 0.3–1.2)
Total Protein: 7.1 g/dL (ref 6.5–8.1)

## 2021-09-14 MED ORDER — SODIUM CHLORIDE 0.9% FLUSH
10.0000 mL | Freq: Once | INTRAVENOUS | Status: AC
  Administered 2021-09-14: 10 mL

## 2021-09-14 MED ORDER — HEPARIN SOD (PORK) LOCK FLUSH 100 UNIT/ML IV SOLN
500.0000 [IU] | Freq: Once | INTRAVENOUS | Status: AC
  Administered 2021-09-14: 500 [IU]

## 2021-09-20 MED FILL — Dexamethasone Sodium Phosphate Inj 100 MG/10ML: INTRAMUSCULAR | Qty: 1 | Status: AC

## 2021-09-20 MED FILL — Fosaprepitant Dimeglumine For IV Infusion 150 MG (Base Eq): INTRAVENOUS | Qty: 5 | Status: AC

## 2021-09-21 ENCOUNTER — Other Ambulatory Visit: Payer: Self-pay

## 2021-09-21 ENCOUNTER — Inpatient Hospital Stay: Payer: BC Managed Care – PPO

## 2021-09-21 ENCOUNTER — Telehealth: Payer: Self-pay

## 2021-09-21 ENCOUNTER — Inpatient Hospital Stay: Payer: BC Managed Care – PPO | Admitting: Nutrition

## 2021-09-21 ENCOUNTER — Encounter: Payer: Self-pay | Admitting: Internal Medicine

## 2021-09-21 ENCOUNTER — Inpatient Hospital Stay (HOSPITAL_BASED_OUTPATIENT_CLINIC_OR_DEPARTMENT_OTHER): Payer: BC Managed Care – PPO | Admitting: Internal Medicine

## 2021-09-21 ENCOUNTER — Other Ambulatory Visit: Payer: BC Managed Care – PPO

## 2021-09-21 ENCOUNTER — Inpatient Hospital Stay: Payer: BC Managed Care – PPO | Attending: Oncology

## 2021-09-21 VITALS — BP 106/71 | HR 76 | Resp 18

## 2021-09-21 VITALS — BP 157/82 | HR 73 | Temp 97.8°F | Resp 16 | Wt 99.9 lb

## 2021-09-21 DIAGNOSIS — E538 Deficiency of other specified B group vitamins: Secondary | ICD-10-CM | POA: Insufficient documentation

## 2021-09-21 DIAGNOSIS — Z5112 Encounter for antineoplastic immunotherapy: Secondary | ICD-10-CM

## 2021-09-21 DIAGNOSIS — C3491 Malignant neoplasm of unspecified part of right bronchus or lung: Secondary | ICD-10-CM

## 2021-09-21 DIAGNOSIS — Z114 Encounter for screening for human immunodeficiency virus [HIV]: Secondary | ICD-10-CM

## 2021-09-21 DIAGNOSIS — C3490 Malignant neoplasm of unspecified part of unspecified bronchus or lung: Secondary | ICD-10-CM | POA: Diagnosis not present

## 2021-09-21 DIAGNOSIS — R55 Syncope and collapse: Secondary | ICD-10-CM | POA: Diagnosis not present

## 2021-09-21 DIAGNOSIS — Z1159 Encounter for screening for other viral diseases: Secondary | ICD-10-CM

## 2021-09-21 DIAGNOSIS — C3411 Malignant neoplasm of upper lobe, right bronchus or lung: Secondary | ICD-10-CM | POA: Insufficient documentation

## 2021-09-21 DIAGNOSIS — Z5111 Encounter for antineoplastic chemotherapy: Secondary | ICD-10-CM | POA: Insufficient documentation

## 2021-09-21 DIAGNOSIS — Z95828 Presence of other vascular implants and grafts: Secondary | ICD-10-CM

## 2021-09-21 DIAGNOSIS — Z0001 Encounter for general adult medical examination with abnormal findings: Secondary | ICD-10-CM

## 2021-09-21 DIAGNOSIS — Z79899 Other long term (current) drug therapy: Secondary | ICD-10-CM | POA: Diagnosis not present

## 2021-09-21 LAB — CBC WITH DIFFERENTIAL (CANCER CENTER ONLY)
Abs Immature Granulocytes: 0.07 10*3/uL (ref 0.00–0.07)
Basophils Absolute: 0.1 10*3/uL (ref 0.0–0.1)
Basophils Relative: 1 %
Eosinophils Absolute: 0.2 10*3/uL (ref 0.0–0.5)
Eosinophils Relative: 2 %
HCT: 37.5 % (ref 36.0–46.0)
Hemoglobin: 13.4 g/dL (ref 12.0–15.0)
Immature Granulocytes: 1 %
Lymphocytes Relative: 31 %
Lymphs Abs: 2.5 10*3/uL (ref 0.7–4.0)
MCH: 34.5 pg — ABNORMAL HIGH (ref 26.0–34.0)
MCHC: 35.7 g/dL (ref 30.0–36.0)
MCV: 96.6 fL (ref 80.0–100.0)
Monocytes Absolute: 1.2 10*3/uL — ABNORMAL HIGH (ref 0.1–1.0)
Monocytes Relative: 15 %
Neutro Abs: 4.1 10*3/uL (ref 1.7–7.7)
Neutrophils Relative %: 50 %
Platelet Count: 377 10*3/uL (ref 150–400)
RBC: 3.88 MIL/uL (ref 3.87–5.11)
RDW: 15.2 % (ref 11.5–15.5)
WBC Count: 8 10*3/uL (ref 4.0–10.5)
nRBC: 0 % (ref 0.0–0.2)

## 2021-09-21 LAB — CMP (CANCER CENTER ONLY)
ALT: 31 U/L (ref 0–44)
AST: 21 U/L (ref 15–41)
Albumin: 3.8 g/dL (ref 3.5–5.0)
Alkaline Phosphatase: 207 U/L — ABNORMAL HIGH (ref 38–126)
Anion gap: 7 (ref 5–15)
BUN: 9 mg/dL (ref 8–23)
CO2: 28 mmol/L (ref 22–32)
Calcium: 9.1 mg/dL (ref 8.9–10.3)
Chloride: 104 mmol/L (ref 98–111)
Creatinine: 0.64 mg/dL (ref 0.44–1.00)
GFR, Estimated: >60 mL/min (ref >60)
Glucose, Bld: 91 mg/dL (ref 70–99)
Potassium: 3.3 mmol/L — ABNORMAL LOW (ref 3.5–5.1)
Sodium: 139 mmol/L (ref 135–145)
Total Bilirubin: 0.4 mg/dL (ref 0.3–1.2)
Total Protein: 7.3 g/dL (ref 6.5–8.1)

## 2021-09-21 MED ORDER — SODIUM CHLORIDE 0.9% FLUSH
10.0000 mL | INTRAVENOUS | Status: DC | PRN
  Administered 2021-09-21: 10 mL

## 2021-09-21 MED ORDER — HEPARIN SOD (PORK) LOCK FLUSH 100 UNIT/ML IV SOLN
500.0000 [IU] | Freq: Once | INTRAVENOUS | Status: AC | PRN
  Administered 2021-09-21: 500 [IU]

## 2021-09-21 MED ORDER — SODIUM CHLORIDE 0.9 % IV SOLN
327.0000 mg | Freq: Once | INTRAVENOUS | Status: AC
  Administered 2021-09-21: 330 mg via INTRAVENOUS
  Filled 2021-09-21: qty 33

## 2021-09-21 MED ORDER — SODIUM CHLORIDE 0.9% FLUSH
10.0000 mL | Freq: Once | INTRAVENOUS | Status: AC
  Administered 2021-09-21: 10 mL

## 2021-09-21 MED ORDER — PALONOSETRON HCL INJECTION 0.25 MG/5ML
0.2500 mg | Freq: Once | INTRAVENOUS | Status: AC
  Administered 2021-09-21: 0.25 mg via INTRAVENOUS
  Filled 2021-09-21: qty 5

## 2021-09-21 MED ORDER — SODIUM CHLORIDE 0.9 % IV SOLN
150.0000 mg | Freq: Once | INTRAVENOUS | Status: AC
  Administered 2021-09-21: 150 mg via INTRAVENOUS
  Filled 2021-09-21: qty 150

## 2021-09-21 MED ORDER — SODIUM CHLORIDE 0.9 % IV SOLN
10.0000 mg | Freq: Once | INTRAVENOUS | Status: AC
  Administered 2021-09-21: 10 mg via INTRAVENOUS
  Filled 2021-09-21: qty 10

## 2021-09-21 MED ORDER — SODIUM CHLORIDE 0.9 % IV SOLN: Freq: Once | INTRAVENOUS | Status: AC

## 2021-09-21 MED ORDER — SODIUM CHLORIDE 0.9 % IV SOLN
500.0000 mg/m2 | Freq: Once | INTRAVENOUS | Status: AC
  Administered 2021-09-21: 700 mg via INTRAVENOUS
  Filled 2021-09-21: qty 20

## 2021-09-21 MED ORDER — SODIUM CHLORIDE 0.9 % IV SOLN
200.0000 mg | Freq: Once | INTRAVENOUS | Status: AC
  Administered 2021-09-21: 200 mg via INTRAVENOUS
  Filled 2021-09-21: qty 200

## 2021-09-21 MED ORDER — CYANOCOBALAMIN 1000 MCG/ML IJ SOLN
1000.0000 ug | Freq: Once | INTRAMUSCULAR | Status: AC
  Administered 2021-09-21: 1000 ug via INTRAMUSCULAR
  Filled 2021-09-21: qty 1

## 2021-09-21 NOTE — Progress Notes (Signed)
[facility] Telephone:([phone]) [phone]   Fax:(336) [phone]  OFFICE PROGRESS NOTE  Sue Peng, NP [place] [address]  DIAGNOSIS:  Stage IV (T2a, N0, M1a) non-small cell lung cancer, adenocarcinoma presented with multifocal bilateral pulmonary nodules involving the right upper lobe as well as the smaller bilateral groundglass opacities diagnosed in April 2023.  PD-L1 expression is 4%.  Molecular studies by [employer] 360 tissue test showed positive KRAS G12C mutation but the blood test failed secondary to insufficient circulating tumor DNA.  PRIOR THERAPY: None  CURRENT THERAPY: Systemic chemotherapy with carboplatin for AUC of 5, Alimta 500 Mg/M2 and Keytruda 200 Mg IV every 3 weeks.  First dose 08/10/2021.  Status post 2 cycles.  INTERVAL HISTORY: Sue Chase 66 y.o. female returns to the clinic today for follow-up visit accompanied by her daughter.  The patient is feeling fine today with no concerning complaints except for 2 episodes of nausea in the last 2 cycles.  She denied having any current chest pain, shortness of breath, cough or hemoptysis.  She has no current nausea, vomiting, diarrhea or constipation.  She has no headache or visual changes.  She is sometimes forgetful.  She is here today for evaluation before starting cycle #3 of her treatment.  MEDICAL HISTORY: Past Medical History:  Diagnosis Date   Colon polyps    Complication of anesthesia    tolerated propofol but other meds made her emotional / cry/ difficulty breathing/ panic   Hyperlipidemia    Rheumatic fever    Seasonal allergies    UTI (urinary tract infection)     ALLERGIES:  is allergic to codeine.  MEDICATIONS:  Current Outpatient Medications  Medication Sig Dispense Refill   albuterol (VENTOLIN HFA) 108 (90 Base) MCG/ACT inhaler Inhale 2 puffs into the lungs every 6 (six) hours as needed for wheezing or shortness of breath. 8 g 1   apixaban  (ELIQUIS) 5 MG TABS tablet Take 1 tablet (5 mg total) by mouth 2 (two) times daily. 779 tablet 1   folic acid (FOLVITE) 1 MG tablet Take 1 tablet (1 mg total) by mouth daily. 30 tablet 4   lidocaine-prilocaine (EMLA) cream Apply to the Port-A-Cath site 30 minutes before chemotherapy 30 g 0   magnesium oxide (MAG-OX) 400 (240 Mg) MG tablet Take 400 mg by mouth daily.     mirtazapine (REMERON) 15 MG tablet Take 2 tablets (30 mg total) by mouth at bedtime. (Patient not taking: Reported on 08/30/2021) 30 tablet 2   nicotine (NICODERM CQ - DOSED IN MG/24 HOURS) 14 mg/24hr patch Place 1 patch (14 mg total) onto the skin daily. (Patient not taking: Reported on 06/22/2021) 28 patch 0   omeprazole (PRILOSEC) 20 MG capsule Take 1 capsule (20 mg total) by mouth daily. (Patient not taking: Reported on 07/06/2021) 30 capsule 3   potassium chloride SA (KLOR-CON M) 20 MEQ tablet Take 1 tablet (20 mEq total) by mouth daily. 90 tablet 3   potassium chloride SA (KLOR-CON M) 20 MEQ tablet Take 1 tablet (20 mEq total) by mouth daily. 10 tablet 0   prochlorperazine (COMPAZINE) 10 MG tablet Take 1 tablet (10 mg total) by mouth every 6 (six) hours as needed for nausea or vomiting. (Patient not taking: Reported on 08/30/2021) 30 tablet 0   No current facility-administered medications for this visit.    SURGICAL HISTORY:  Past Surgical History:  Procedure Laterality Date   BRONCHIAL BIOPSY  06/08/2021  Procedure: BRONCHIAL BIOPSIES;  Surgeon: Garner Nash, DO;  Location: [facility];  Service: Pulmonary;;   BRONCHIAL BRUSHINGS  06/08/2021   Procedure: BRONCHIAL BRUSHINGS;  Surgeon: Garner Nash, DO;  Location: [facility];  Service: Pulmonary;;   BRONCHIAL NEEDLE ASPIRATION BIOPSY  06/08/2021   Procedure: BRONCHIAL NEEDLE ASPIRATION BIOPSIES;  Surgeon: Garner Nash, DO;  Location: [facility];  Service: Pulmonary;;   COLONOSCOPY  2018   IR IMAGING GUIDED PORT INSERTION  08/12/2021   SALIVARY GLAND SURGERY      LATE 80S   TUBAL LIGATION  1986   VIDEO BRONCHOSCOPY WITH RADIAL ENDOBRONCHIAL ULTRASOUND  06/08/2021   Procedure: VIDEO BRONCHOSCOPY WITH RADIAL ENDOBRONCHIAL ULTRASOUND;  Surgeon: Garner Nash, DO;  Location: [facility];  Service: Pulmonary;;    REVIEW OF SYSTEMS:  A comprehensive review of systems was negative except for: Constitutional: positive for fatigue Gastrointestinal: positive for nausea   PHYSICAL EXAMINATION: General appearance: alert, cooperative, fatigued, and no distress Head: Normocephalic, without obvious abnormality, atraumatic Neck: no adenopathy, no JVD, supple, symmetrical, trachea midline, and thyroid not enlarged, symmetric, no tenderness/mass/nodules Lymph nodes: Cervical, supraclavicular, and axillary nodes normal. Resp: clear to auscultation bilaterally Back: symmetric, no curvature. ROM normal. No CVA tenderness. Cardio: regular rate and rhythm, S1, S2 normal, no murmur, click, rub or gallop GI: soft, non-tender; bowel sounds normal; no masses,  no organomegaly Extremities: extremities normal, atraumatic, no cyanosis or edema  ECOG PERFORMANCE STATUS: 1 - Symptomatic but completely ambulatory  Blood pressure (!) 157/82, pulse 73, temperature 97.8 F (36.6 C), temperature source Oral, resp. rate 16, weight 99 lb 14.4 oz (45.3 kg), SpO2 100 %.  LABORATORY DATA: Lab Results  Component Value Date   WBC 8.0 09/21/2021   HGB 13.4 09/21/2021   HCT 37.5 09/21/2021   MCV 96.6 09/21/2021   PLT 377 09/21/2021      Chemistry      Component Value Date/Time   NA 138 09/14/2021 1326   K 3.7 09/14/2021 1326   CL 101 09/14/2021 1326   CO2 31 09/14/2021 1326   BUN 15 09/14/2021 1326   CREATININE 0.62 09/14/2021 1326      Component Value Date/Time   CALCIUM 9.5 09/14/2021 1326   ALKPHOS 343 (H) 09/14/2021 1326   AST 32 09/14/2021 1326   ALT 74 (H) 09/14/2021 1326   BILITOT 0.7 09/14/2021 1326       RADIOGRAPHIC STUDIES: No results  found.  ASSESSMENT AND PLAN: This is a very pleasant 66 years old white female diagnosed with a stage IV (T2 a, N0, M1 a) non-small cell lung cancer, adenocarcinoma presented with multifocal bilateral pulmonary nodules involving the right upper lobe as well as the smaller bilateral groundglass opacities diagnosed in April 2023 with positive KRAS G12C mutation and PD-L1 expression of 4%. I had a lengthy discussion with the patient and her daughter today about her current disease stage, prognosis and treatment options. I discussed with the patient several options for management of her condition including SBRT to the large left upper lobe lung nodule followed by close monitoring of the other bilateral groundglass opacities versus consideration of systemic chemotherapy with carboplatin for AUC of 5, Alimta 500 Mg/M2 and Keytruda 200 Mg IV every 3 weeks versus treatment with just immunotherapy with ipilimumab 1 Mg/KG every 6 weeks and nivolumab 360 Mg IV every 3 weeks. She was interested in proceeding with the systemic therapy and she is currently on treatment with carboplatin, Alimta and Keytruda status post 2 cycles.  The patient has been tolerating this treatment well with no concerning complaints except for occasional nausea. I recommended for her to proceed with cycle #3 today as planned. I will see her back for follow-up visit in 3 weeks for evaluation with repeat CT scan of the chest, abdomen and pelvis for restaging of her disease. She was advised to call immediately if she has any concerning symptoms in the interval. The patient voices understanding of current disease status and treatment options and is in agreement with the current care plan. All questions were answered. The patient knows to call the clinic with any problems, questions or concerns. We can certainly see the patient much sooner if necessary.  The total time spent in the appointment was 20 minutes.  Disclaimer: This note was dictated  with voice recognition software. Similar sounding words can inadvertently be transcribed and may not be corrected upon review.        

## 2021-09-21 NOTE — Progress Notes (Signed)
Nutrition follow-up completed with patient during infusion for stage IV lung cancer.  Patient is receiving carboplatin, Alimta, and Keytruda every 3 weeks.  Weight relatively stable and documented as 99 pounds 14 ounces on August 1.  This is decreased from 100 pounds 10 ounces July 10.  Noted labs: Potassium 3.3.  Patient states she continues to eat as usual.  She reports occasional nausea but is not ongoing nor is it especially bothersome.  Sometimes she tolerates a vanilla milkshake well and will consume this for a meal.  She finds chocolate milkshakes do not necessarily agree with her.  Nutrition diagnosis:  Food and nutrition related knowledge deficit improved.  Intervention: Encourage patient to try Ensure complete for an additional 350 cal and 30 g protein daily.  Provided samples and coupons. Reviewed high potassium foods.  Patient still has education fact sheet provided at last visit. Encouraged weight maintenance.  Monitoring, evaluation, goals: Patient will tolerate adequate calories and protein for weight maintenance.  Next visit: Tuesday, August 22 during infusion.  **Disclaimer: This note was dictated with voice recognition software. Similar sounding words can inadvertently be transcribed and this note may contain transcription errors which may not have been corrected upon publication of note.**

## 2021-09-21 NOTE — Patient Instructions (Signed)
Steps to Quit Smoking Smoking tobacco is the leading cause of preventable death. It can affect almost every organ in the body. Smoking puts you and people around you at risk for many serious, long-lasting (chronic) diseases. Quitting smoking can be hard, but it is one of the best things that you can do for your health. It is never too late to quit. Do not give up if you cannot quit the first time. Some people need to try many times to quit. Do your best to stick to your quit plan, and talk with your doctor if you have any questions or concerns. How do I get ready to quit? Pick a date to quit. Set a date within the next 2 weeks to give you time to prepare. Write down the reasons why you are quitting. Keep this list in places where you will see it often. Tell your family, friends, and co-workers that you are quitting. Their support is important. Talk with your doctor about the choices that may help you quit. Find out if your health insurance will pay for these treatments. Know the people, places, things, and activities that make you want to smoke (triggers). Avoid them. What first steps can I take to quit smoking? Throw away all cigarettes at home, at work, and in your car. Throw away the things that you use when you smoke, such as ashtrays and lighters. Clean your car. Empty the ashtray. Clean your home, including curtains and carpets. What can I do to help me quit smoking? Talk with your doctor about taking medicines and seeing a counselor. You are more likely to succeed when you do both. If you are pregnant or breastfeeding: Talk with your doctor about counseling or other ways to quit smoking. Do not take medicine to help you quit smoking unless your doctor tells you to. Quit right away Quit smoking completely, instead of slowly cutting back on how much you smoke over a period of time. Stopping smoking right away may be more successful than slowly quitting. Go to counseling. In-person is best  if this is an option. You are more likely to quit if you go to counseling sessions regularly. Take medicine You may take medicines to help you quit. Some medicines need a prescription, and some you can buy over-the-counter. Some medicines may contain a drug called nicotine to replace the nicotine in cigarettes. Medicines may: Help you stop having the desire to smoke (cravings). Help to stop the problems that come when you stop smoking (withdrawal symptoms). Your doctor may ask you to use: Nicotine patches, gum, or lozenges. Nicotine inhalers or sprays. Non-nicotine medicine that you take by mouth. Find resources Find resources and other ways to help you quit smoking and remain smoke-free after you quit. They include: Online chats with a counselor. Phone quitlines. Printed self-help materials. Support groups or group counseling. Text messaging programs. Mobile phone apps. Use apps on your mobile phone or tablet that can help you stick to your quit plan. Examples of free services include Quit Guide from the CDC and smokefree.gov  What can I do to make it easier to quit?  Talk to your family and friends. Ask them to support and encourage you. Call a phone quitline, such as 1-800-QUIT-NOW, reach out to support groups, or work with a counselor. Ask people who smoke to not smoke around you. Avoid places that make you want to smoke, such as: Bars. Parties. Smoke-break areas at work. Spend time with people who do not smoke. Lower   the stress in your life. Stress can make you want to smoke. Try these things to lower stress: Getting regular exercise. Doing deep-breathing exercises. Doing yoga. Meditating. What benefits will I see if I quit smoking? Over time, you may have: A better sense of smell and taste. Less coughing and sore throat. A slower heart rate. Lower blood pressure. Clearer skin. Better breathing. Fewer sick days. Summary Quitting smoking can be hard, but it is one of  the best things that you can do for your health. Do not give up if you cannot quit the first time. Some people need to try many times to quit. When you decide to quit smoking, make a plan to help you succeed. Quit smoking right away, not slowly over a period of time. When you start quitting, get help and support to keep you smoke-free. This information is not intended to replace advice given to you by your health care provider. Make sure you discuss any questions you have with your health care provider. Document Revised: 01/29/2021 Document Reviewed: 01/29/2021 Elsevier Patient Education  2023 Elsevier Inc.  

## 2021-09-21 NOTE — Progress Notes (Signed)
Per Julien Nordmann MD, ok to treat with low TSH levels today.

## 2021-09-21 NOTE — Telephone Encounter (Signed)
Pt called stating she wanted to confirm it is okay to take her Compazine, in-spite of being given pre-meds before her tx yesterday. Pt was advised it is okay to take her Compazine PRN for nausea. Pt expressed understanding of this information.

## 2021-09-21 NOTE — Patient Instructions (Signed)
[facility] ONCOLOGY  Discharge Instructions: Thank you for choosing [facility] to provide your oncology and hematology care.   If you have a lab appointment with the [facility], please go directly to the [facility] and check in at the registration area.   Wear comfortable clothing and clothing appropriate for easy access to any Portacath or PICC line.   We strive to give you quality time with your provider. You may need to reschedule your appointment if you arrive late (15 or more minutes).  Arriving late affects you and other patients whose appointments are after yours.  Also, if you miss three or more appointments without notifying the office, you may be dismissed from the clinic at the provider's discretion.      For prescription refill requests, have your pharmacy contact our office and allow 72 hours for refills to be completed.    Today you received the following chemotherapy and/or immunotherapy agents: Keytruda, Pemetrexed, Carboplatin.      To help prevent nausea and vomiting after your treatment, we encourage you to take your nausea medication as directed.  BELOW ARE SYMPTOMS THAT SHOULD BE REPORTED IMMEDIATELY: *FEVER GREATER THAN 100.4 F (38 C) OR HIGHER *CHILLS OR SWEATING *NAUSEA AND VOMITING THAT IS NOT CONTROLLED WITH YOUR NAUSEA MEDICATION *UNUSUAL SHORTNESS OF BREATH *UNUSUAL BRUISING OR BLEEDING *URINARY PROBLEMS (pain or burning when urinating, or frequent urination) *BOWEL PROBLEMS (unusual diarrhea, constipation, pain near the anus) TENDERNESS IN MOUTH AND THROAT WITH OR WITHOUT PRESENCE OF ULCERS (sore throat, sores in mouth, or a toothache) UNUSUAL RASH, SWELLING OR PAIN  UNUSUAL VAGINAL DISCHARGE OR ITCHING   Items with * indicate a potential emergency and should be followed up as soon as possible or go to the Emergency Department if any problems should occur.  Please show the CHEMOTHERAPY ALERT CARD or IMMUNOTHERAPY  ALERT CARD at check-in to the Emergency Department and triage nurse.  Should you have questions after your visit or need to cancel or reschedule your appointment, please contact [facility]  Dept: [phone]  and follow the prompts.  Office hours are 8:00 a.m. to 4:30 p.m. Monday - Friday. Please note that voicemails left after 4:00 p.m. may not be returned until the following business day.  We are closed weekends and major holidays. You have access to a nurse at all times for urgent questions. Please call the main number to the clinic Dept: [phone] and follow the prompts.   For any non-urgent questions, you may also contact your provider using MyChart. We now offer e-Visits for anyone 7 and older to request care online for non-urgent symptoms. For details visit [email or web address].   Also download the MyChart app! Go to the app store, search "MyChart", open the app, select [facility], and log in with your MyChart username and password.  Masks are optional in the cancer centers. If you would like for your care team to wear a mask while they are taking care of you, please let them know. You may have one support person who is at least 66 years old accompany you for your appointments.

## 2021-09-22 ENCOUNTER — Other Ambulatory Visit: Payer: Self-pay

## 2021-09-23 ENCOUNTER — Other Ambulatory Visit: Payer: Self-pay

## 2021-09-28 ENCOUNTER — Inpatient Hospital Stay: Payer: BC Managed Care – PPO

## 2021-09-28 ENCOUNTER — Other Ambulatory Visit: Payer: Self-pay

## 2021-09-28 ENCOUNTER — Other Ambulatory Visit: Payer: BC Managed Care – PPO

## 2021-09-28 ENCOUNTER — Telehealth: Payer: Self-pay

## 2021-09-28 DIAGNOSIS — E538 Deficiency of other specified B group vitamins: Secondary | ICD-10-CM | POA: Diagnosis not present

## 2021-09-28 DIAGNOSIS — Z5112 Encounter for antineoplastic immunotherapy: Secondary | ICD-10-CM | POA: Diagnosis not present

## 2021-09-28 DIAGNOSIS — Z5111 Encounter for antineoplastic chemotherapy: Secondary | ICD-10-CM | POA: Diagnosis not present

## 2021-09-28 DIAGNOSIS — Z95828 Presence of other vascular implants and grafts: Secondary | ICD-10-CM

## 2021-09-28 DIAGNOSIS — Z79899 Other long term (current) drug therapy: Secondary | ICD-10-CM | POA: Diagnosis not present

## 2021-09-28 DIAGNOSIS — C3491 Malignant neoplasm of unspecified part of right bronchus or lung: Secondary | ICD-10-CM

## 2021-09-28 DIAGNOSIS — C3411 Malignant neoplasm of upper lobe, right bronchus or lung: Secondary | ICD-10-CM | POA: Diagnosis not present

## 2021-09-28 LAB — CMP (CANCER CENTER ONLY)
ALT: 118 U/L — ABNORMAL HIGH (ref 0–44)
AST: 59 U/L — ABNORMAL HIGH (ref 15–41)
Albumin: 3.8 g/dL (ref 3.5–5.0)
Alkaline Phosphatase: 271 U/L — ABNORMAL HIGH (ref 38–126)
Anion gap: 4 — ABNORMAL LOW (ref 5–15)
BUN: 17 mg/dL (ref 8–23)
CO2: 33 mmol/L — ABNORMAL HIGH (ref 22–32)
Calcium: 9.6 mg/dL (ref 8.9–10.3)
Chloride: 102 mmol/L (ref 98–111)
Creatinine: 0.55 mg/dL (ref 0.44–1.00)
GFR, Estimated: >60 mL/min (ref >60)
Glucose, Bld: 101 mg/dL — ABNORMAL HIGH (ref 70–99)
Potassium: 3.8 mmol/L (ref 3.5–5.1)
Sodium: 139 mmol/L (ref 135–145)
Total Bilirubin: 0.8 mg/dL (ref 0.3–1.2)
Total Protein: 7.3 g/dL (ref 6.5–8.1)

## 2021-09-28 LAB — CBC WITH DIFFERENTIAL (CANCER CENTER ONLY)
Abs Immature Granulocytes: 0.02 10*3/uL (ref 0.00–0.07)
Basophils Absolute: 0 10*3/uL (ref 0.0–0.1)
Basophils Relative: 0 %
Eosinophils Absolute: 0 10*3/uL (ref 0.0–0.5)
Eosinophils Relative: 1 %
HCT: 33.3 % — ABNORMAL LOW (ref 36.0–46.0)
Hemoglobin: 11.7 g/dL — ABNORMAL LOW (ref 12.0–15.0)
Immature Granulocytes: 1 %
Lymphocytes Relative: 53 %
Lymphs Abs: 1.7 10*3/uL (ref 0.7–4.0)
MCH: 33.5 pg (ref 26.0–34.0)
MCHC: 35.1 g/dL (ref 30.0–36.0)
MCV: 95.4 fL (ref 80.0–100.0)
Monocytes Absolute: 0.1 10*3/uL (ref 0.1–1.0)
Monocytes Relative: 4 %
Neutro Abs: 1.3 10*3/uL — ABNORMAL LOW (ref 1.7–7.7)
Neutrophils Relative %: 41 %
Platelet Count: 234 10*3/uL (ref 150–400)
RBC: 3.49 MIL/uL — ABNORMAL LOW (ref 3.87–5.11)
RDW: 14.8 % (ref 11.5–15.5)
WBC Count: 3.1 10*3/uL — ABNORMAL LOW (ref 4.0–10.5)
nRBC: 0 % (ref 0.0–0.2)

## 2021-09-28 MED ORDER — SODIUM CHLORIDE 0.9% FLUSH
10.0000 mL | Freq: Once | INTRAVENOUS | Status: AC
  Administered 2021-09-28: 10 mL

## 2021-09-28 MED ORDER — HEPARIN SOD (PORK) LOCK FLUSH 100 UNIT/ML IV SOLN
500.0000 [IU] | Freq: Once | INTRAVENOUS | Status: AC
  Administered 2021-09-28: 500 [IU]

## 2021-09-28 NOTE — Telephone Encounter (Signed)
Pt called very upset that her September schedule is not listed. Pt states she sees all her appts on MyChart but nothing for September.   Pt was advised that scheduling is still actively building her schedule and are likely trying to find an infusion and provider time slot, given some of the providers will be away from the office at the time her appt is due. Pt stated "this is just ridiculous! Why would all my other appointments be there and not those?!" I again tried explaining to the pt that her scheduling was still actively being built and she abruptly disconnected the call.

## 2021-09-29 ENCOUNTER — Other Ambulatory Visit: Payer: Self-pay

## 2021-10-03 ENCOUNTER — Ambulatory Visit (HOSPITAL_COMMUNITY)
Admission: RE | Admit: 2021-10-03 | Discharge: 2021-10-03 | Disposition: A | Discharge status: Home / Self Care | Payer: BC Managed Care – PPO | Source: Ambulatory Visit | Attending: Internal Medicine | Admitting: Internal Medicine

## 2021-10-03 DIAGNOSIS — C3490 Malignant neoplasm of unspecified part of unspecified bronchus or lung: Secondary | ICD-10-CM | POA: Insufficient documentation

## 2021-10-03 DIAGNOSIS — R911 Solitary pulmonary nodule: Secondary | ICD-10-CM | POA: Diagnosis not present

## 2021-10-03 DIAGNOSIS — R918 Other nonspecific abnormal finding of lung field: Secondary | ICD-10-CM | POA: Diagnosis not present

## 2021-10-03 DIAGNOSIS — K8050 Calculus of bile duct without cholangitis or cholecystitis without obstruction: Secondary | ICD-10-CM | POA: Diagnosis not present

## 2021-10-03 DIAGNOSIS — J439 Emphysema, unspecified: Secondary | ICD-10-CM | POA: Diagnosis not present

## 2021-10-03 MED ORDER — IOHEXOL 300 MG/ML  SOLN
100.0000 mL | Freq: Once | INTRAMUSCULAR | Status: AC | PRN
  Administered 2021-10-03: 100 mL via INTRAVENOUS

## 2021-10-05 ENCOUNTER — Inpatient Hospital Stay: Payer: BC Managed Care – PPO

## 2021-10-05 ENCOUNTER — Other Ambulatory Visit: Payer: BC Managed Care – PPO

## 2021-10-05 DIAGNOSIS — Z79899 Other long term (current) drug therapy: Secondary | ICD-10-CM | POA: Diagnosis not present

## 2021-10-05 DIAGNOSIS — Z95828 Presence of other vascular implants and grafts: Secondary | ICD-10-CM

## 2021-10-05 DIAGNOSIS — C3491 Malignant neoplasm of unspecified part of right bronchus or lung: Secondary | ICD-10-CM

## 2021-10-05 DIAGNOSIS — Z5111 Encounter for antineoplastic chemotherapy: Secondary | ICD-10-CM | POA: Diagnosis not present

## 2021-10-05 DIAGNOSIS — C3411 Malignant neoplasm of upper lobe, right bronchus or lung: Secondary | ICD-10-CM | POA: Diagnosis not present

## 2021-10-05 DIAGNOSIS — E538 Deficiency of other specified B group vitamins: Secondary | ICD-10-CM | POA: Diagnosis not present

## 2021-10-05 DIAGNOSIS — Z5112 Encounter for antineoplastic immunotherapy: Secondary | ICD-10-CM | POA: Diagnosis not present

## 2021-10-05 LAB — CBC WITH DIFFERENTIAL (CANCER CENTER ONLY)
Abs Immature Granulocytes: 0 10*3/uL (ref 0.00–0.07)
Basophils Absolute: 0 10*3/uL (ref 0.0–0.1)
Basophils Relative: 0 %
Eosinophils Absolute: 0.2 10*3/uL (ref 0.0–0.5)
Eosinophils Relative: 6 %
HCT: 27.9 % — ABNORMAL LOW (ref 36.0–46.0)
Hemoglobin: 10 g/dL — ABNORMAL LOW (ref 12.0–15.0)
Immature Granulocytes: 0 %
Lymphocytes Relative: 58 %
Lymphs Abs: 1.9 10*3/uL (ref 0.7–4.0)
MCH: 33.3 pg (ref 26.0–34.0)
MCHC: 35.8 g/dL (ref 30.0–36.0)
MCV: 93 fL (ref 80.0–100.0)
Monocytes Absolute: 0.6 10*3/uL (ref 0.1–1.0)
Monocytes Relative: 19 %
Neutro Abs: 0.6 10*3/uL — ABNORMAL LOW (ref 1.7–7.7)
Neutrophils Relative %: 17 %
Platelet Count: 41 10*3/uL — ABNORMAL LOW (ref 150–400)
RBC: 3 MIL/uL — ABNORMAL LOW (ref 3.87–5.11)
RDW: 14.2 % (ref 11.5–15.5)
Smear Review: NORMAL
WBC Count: 3.3 10*3/uL — ABNORMAL LOW (ref 4.0–10.5)
nRBC: 0 % (ref 0.0–0.2)

## 2021-10-05 LAB — CMP (CANCER CENTER ONLY)
ALT: 68 U/L — ABNORMAL HIGH (ref 0–44)
AST: 40 U/L (ref 15–41)
Albumin: 3.8 g/dL (ref 3.5–5.0)
Alkaline Phosphatase: 320 U/L — ABNORMAL HIGH (ref 38–126)
Anion gap: 7 (ref 5–15)
BUN: 10 mg/dL (ref 8–23)
CO2: 30 mmol/L (ref 22–32)
Calcium: 9.7 mg/dL (ref 8.9–10.3)
Chloride: 101 mmol/L (ref 98–111)
Creatinine: 0.74 mg/dL (ref 0.44–1.00)
GFR, Estimated: >60 mL/min (ref >60)
Glucose, Bld: 113 mg/dL — ABNORMAL HIGH (ref 70–99)
Potassium: 3.5 mmol/L (ref 3.5–5.1)
Sodium: 138 mmol/L (ref 135–145)
Total Bilirubin: 0.6 mg/dL (ref 0.3–1.2)
Total Protein: 7.2 g/dL (ref 6.5–8.1)

## 2021-10-05 MED ORDER — SODIUM CHLORIDE 0.9% FLUSH
10.0000 mL | Freq: Once | INTRAVENOUS | Status: AC
  Administered 2021-10-05: 10 mL

## 2021-10-05 MED ORDER — HEPARIN SOD (PORK) LOCK FLUSH 100 UNIT/ML IV SOLN
500.0000 [IU] | Freq: Once | INTRAVENOUS | Status: AC
  Administered 2021-10-05: 500 [IU]

## 2021-10-05 NOTE — Progress Notes (Signed)
[facility] OFFICE PROGRESS NOTE  Sue Peng, NP [place] [address]  DIAGNOSIS: Stage IV (T2a, N0, M1a) non-small cell lung cancer, adenocarcinoma presented with multifocal bilateral pulmonary nodules involving the right upper lobe as well as the smaller bilateral groundglass opacities diagnosed in April 2023.  PD-L1 expression is 4%.  Molecular studies by [employer] tissue test showed positive KRAS G12C mutation but the blood test failed secondary to insufficient circulating tumor DNA.  PRIOR THERAPY: None  CURRENT THERAPY: Systemic chemotherapy with carboplatin for AUC of 5, Alimta 500 Mg/M2 and Keytruda 200 Mg IV every 3 weeks.  First dose 08/10/2021.  Status post 3 cycles.   INTERVAL HISTORY: Sue Chase 66 y.o. female returns to the clinic today for a follow-up visit accompanied by her daughter. The patient was recently diagnosed with stage IV multifocal adenocarcinoma. She is currently undergoing chemotherapy and immunotherapy. She tolerates this fairly well.  After her last infusion, the patient did have significant constipation for a few days.  Moving forward, she is going to take over-the-counter medications for constipation to prevent constipation.  She also reports increased urination without any dysuria, cloudy urine, or malodorous urine.  Her recent restaging scan incidentally noted choledocholithiasis.  The patient denies any abdominal pain, fevers, unusual nausea, vomiting, appetite changes, etc.  She reports she had lighter stools approximately twice.  Denies any jaundice.  Denies any itching except for some itching eyes after treatment.  He tried to use eyedrops but they made her eyes feel abnormal.    The patient has been having some weight loss previously.  Her weight is stable today.  She was previously evaluated by member the nutritionist team and she is expected to see them in the infusion room today.  She denies any chest  pain or hemoptysis.  She reports her breathing is "okay" but is little bit worse in the heat.  She reports a intermittent cough but nothing out of the ordinary.  Denies any headaches or visual changes.  The patient is currently on Eliquis for DVT in the right leg.  She has bilateral lower extremity swelling without any calf pain.  The left leg is worse than the right leg.  She thinks that the swelling is getting a little bit worse.  She is worried about blood clots.  He is compliant with her blood thinner.  She recently had a restaging CT scan performed. She is here for evaluation and repeat blood work before starting cycle #4.    MEDICAL HISTORY: Past Medical History:  Diagnosis Date   Colon polyps    Complication of anesthesia    tolerated propofol but other meds made her emotional / cry/ difficulty breathing/ panic   Hyperlipidemia    Rheumatic fever    Seasonal allergies    UTI (urinary tract infection)     ALLERGIES:  is allergic to codeine.  MEDICATIONS:  Current Outpatient Medications  Medication Sig Dispense Refill   albuterol (VENTOLIN HFA) 108 (90 Base) MCG/ACT inhaler Inhale 2 puffs into the lungs every 6 (six) hours as needed for wheezing or shortness of breath. 8 g 1   apixaban (ELIQUIS) 5 MG TABS tablet Take 1 tablet (5 mg total) by mouth 2 (two) times daily. 254 tablet 1   folic acid (FOLVITE) 1 MG tablet Take 1 tablet (1 mg total) by mouth daily. 30 tablet 4   lidocaine-prilocaine (EMLA) cream Apply to the Port-A-Cath site 30 minutes before chemotherapy 30 g 0  magnesium oxide (MAG-OX) 400 (240 Mg) MG tablet Take 400 mg by mouth daily.     mirtazapine (REMERON) 15 MG tablet Take 2 tablets (30 mg total) by mouth at bedtime. (Patient not taking: Reported on 08/30/2021) 30 tablet 2   omeprazole (PRILOSEC) 20 MG capsule Take 1 capsule (20 mg total) by mouth daily. (Patient not taking: Reported on 07/06/2021) 30 capsule 3   potassium chloride SA (KLOR-CON M) 20 MEQ tablet Take 1  tablet (20 mEq total) by mouth daily. 10 tablet 0   prochlorperazine (COMPAZINE) 10 MG tablet Take 1 tablet (10 mg total) by mouth every 6 (six) hours as needed for nausea or vomiting. (Patient not taking: Reported on 08/30/2021) 30 tablet 0   No current facility-administered medications for this visit.    SURGICAL HISTORY:  Past Surgical History:  Procedure Laterality Date   BRONCHIAL BIOPSY  06/08/2021   Procedure: BRONCHIAL BIOPSIES;  Surgeon: Garner Nash, DO;  Location: [facility];  Service: Pulmonary;;   BRONCHIAL BRUSHINGS  06/08/2021   Procedure: BRONCHIAL BRUSHINGS;  Surgeon: Garner Nash, DO;  Location: [facility];  Service: Pulmonary;;   BRONCHIAL NEEDLE ASPIRATION BIOPSY  06/08/2021   Procedure: BRONCHIAL NEEDLE ASPIRATION BIOPSIES;  Surgeon: Garner Nash, DO;  Location: [facility];  Service: Pulmonary;;   COLONOSCOPY  2018   IR IMAGING GUIDED PORT INSERTION  08/12/2021   SALIVARY GLAND SURGERY     LATE 80S   TUBAL LIGATION  1986   VIDEO BRONCHOSCOPY WITH RADIAL ENDOBRONCHIAL ULTRASOUND  06/08/2021   Procedure: VIDEO BRONCHOSCOPY WITH RADIAL ENDOBRONCHIAL ULTRASOUND;  Surgeon: Garner Nash, DO;  Location: [facility];  Service: Pulmonary;;    REVIEW OF SYSTEMS:   Review of Systems  Constitutional: Negative for appetite change, chills, fatigue, fever and unexpected weight change.  HENT: Negative for mouth sores, nosebleeds, sore throat and trouble swallowing.   Eyes: Positive for itchy eyes occasionally.  Negative for eye problems and icterus.  Respiratory: Positive for occasional dyspnea.  Negative for cough, hemoptysis, and wheezing.   Cardiovascular: Negative for chest pain.  Positive for bilateral lower extremity swelling (left greater than right) Gastrointestinal: Negative for abdominal pain, constipation, diarrhea, nausea and vomiting.  Genitourinary: Positive for frequent urination.  Negative for bladder incontinence, difficulty urinating, dysuria,  frequency and hematuria.   Musculoskeletal: Negative for back pain, gait problem, neck pain and neck stiffness.  Skin: Negative for itching and rash.  Neurological: Negative for dizziness, extremity weakness, gait problem, headaches, light-headedness and seizures.  Hematological: Negative for adenopathy. Does not bruise/bleed easily.  Psychiatric/Behavioral: Negative for confusion, depression and sleep disturbance. The patient is not nervous/anxious.     PHYSICAL EXAMINATION:  There were no vitals taken for this visit.  ECOG PERFORMANCE STATUS: 1  Physical Exam  Constitutional: Oriented to person, place, and time and well-developed, well-nourished, and in no distress.  HENT:  Head: Normocephalic and atraumatic.  Mouth/Throat: Oropharynx is clear and moist. No oropharyngeal exudate.  Eyes: Conjunctivae are normal. Right eye exhibits no discharge. Left eye exhibits no discharge. No scleral icterus.  Neck: Normal range of motion. Neck supple.  Cardiovascular: Normal rate, regular rhythm, normal heart sounds and intact distal pulses.   Pulmonary/Chest: Effort normal and breath sounds normal. No respiratory distress. No wheezes. No rales.  Abdominal: Soft. Bowel sounds are normal. Exhibits no distension and no mass. There is no tenderness.  Musculoskeletal: Normal range of motion.  Positive for bilateral lower extremity swelling.  No calf pain. Lymphadenopathy:  No cervical adenopathy.  Neurological: Alert and oriented to person, place, and time. Exhibits normal muscle tone. Gait normal. Coordination normal.  Skin: Skin is warm and dry. No rash noted. Not diaphoretic. No erythema. No pallor.  Psychiatric: Mood, memory and judgment normal.  Vitals reviewed.  LABORATORY DATA: Lab Results  Component Value Date   WBC 3.3 (L) 10/05/2021   HGB 10.0 (L) 10/05/2021   HCT 27.9 (L) 10/05/2021   MCV 93.0 10/05/2021   PLT 41 (L) 10/05/2021      Chemistry      Component Value Date/Time    NA 138 10/05/2021 1336   K 3.5 10/05/2021 1336   CL 101 10/05/2021 1336   CO2 30 10/05/2021 1336   BUN 10 10/05/2021 1336   CREATININE 0.74 10/05/2021 1336      Component Value Date/Time   CALCIUM 9.7 10/05/2021 1336   ALKPHOS 320 (H) 10/05/2021 1336   AST 40 10/05/2021 1336   ALT 68 (H) 10/05/2021 1336   BILITOT 0.6 10/05/2021 1336       RADIOGRAPHIC STUDIES:  CT Chest W Contrast  Result Date: 10/04/2021 CLINICAL DATA:  Non-small-cell lung cancer. Biopsy 06/08/2021. Stage IV adenocarcinoma. Status post chemotherapy and immunotherapy. * Tracking Code: BO * EXAM: CT CHEST, ABDOMEN, AND PELVIS WITH CONTRAST TECHNIQUE: Multidetector CT imaging of the chest, abdomen and pelvis was performed following the standard protocol during bolus administration of intravenous contrast. RADIATION DOSE REDUCTION: This exam was performed according to the departmental dose-optimization program which includes automated exposure control, adjustment of the mA and/or kV according to patient size and/or use of iterative reconstruction technique. CONTRAST:  175m OMNIPAQUE IOHEXOL 300 MG/ML  SOLN COMPARISON:  06/25/2021 PET FINDINGS: CT CHEST FINDINGS Cardiovascular: Right Port-A-Cath tip low SVC. Aortic atherosclerosis. Intramural hematoma within the proximal transverse aorta 22/2. Hyperattenuating on prior noncontrast imaging. Normal heart size, without pericardial effusion. Lad coronary artery calcification. No central pulmonary embolism, on this non-dedicated study. Mediastinum/Nodes: No supraclavicular adenopathy. No mediastinal or hilar adenopathy. Lungs/Pleura: No pleural fluid. The ill-defined posterior right upper lobe mixed attenuation lesion is again identified. Measures on the order of 3.3 x 2.8 cm on 49/4, similar to on the prior. Solid nodular component medially measures 11 mm on 50/4 versus 10 mm on 05/10/2021. Innumerable bilateral ground-glass nodules are again identified. An index superior segment right  lower lobe ground-glass nodule measures 1.6 cm on 91/4 is similar 1.7 cm on the prior chest CT (when remeasured). Index lingular ground-glass nodule of 8 mm on 81/4 is similar to on the prior. There also scattered tiny solid nodules primarily in the right upper lobe, for example at 3 mm on 36/4, which are similar. Musculoskeletal: T9 compression deformity is mild and similar to on the prior. CT ABDOMEN PELVIS FINDINGS Hepatobiliary: No focal liver lesion. No gallstones or acute cholecystitis. Development of moderate intrahepatic biliary duct dilatation. The common duct measures 1.4 cm on 67/2. Multiple noncalcified stones are identified within, including at 1.4 cm on 69/2. Pancreas: No pancreatic duct dilatation or acute pancreatitis. Spleen: Normal in size, without focal abnormality. Adrenals/Urinary Tract: Normal adrenal glands. Normal kidneys, without hydronephrosis. Normal urinary bladder. Stomach/Bowel: Normal stomach, without wall thickening. Extensive colonic diverticulosis. Normal terminal ileum. Normal small bowel. Vascular/Lymphatic: Aortic atherosclerosis. Significant stenosis of the left femoral artery including on 100/2 and coronal image 62. No abdominopelvic adenopathy. Reproductive: Normal uterus and adnexa. Other: No significant free fluid. Mild pelvic floor laxity. No evidence of omental or peritoneal disease. Musculoskeletal: Osteopenia. IMPRESSION: 1. Similar  appearance of posterior right upper lobe mixed attenuation lung mass. 2. Innumerable bilateral ground-glass nodules are also not significantly changed. 3. No thoracic adenopathy. 4. Choledocholithiasis with development of biliary duct dilatation. These results will be called to the ordering clinician or representative by the Radiologist Assistant, and communication documented in the PACS or [employer]. 5. Aortic atherosclerosis (ICD10-I70.0), coronary artery atherosclerosis and emphysema (ICD10-J43.9). 6. Intramural hematoma within the  proximal transverse aorta. Marked stenosis of the left femoral artery. Electronically Signed   By: Abigail Miyamoto M.D.   On: 10/04/2021 09:09   CT Abdomen Pelvis W Contrast  Result Date: 10/04/2021 CLINICAL DATA:  Non-small-cell lung cancer. Biopsy 06/08/2021. Stage IV adenocarcinoma. Status post chemotherapy and immunotherapy. * Tracking Code: BO * EXAM: CT CHEST, ABDOMEN, AND PELVIS WITH CONTRAST TECHNIQUE: Multidetector CT imaging of the chest, abdomen and pelvis was performed following the standard protocol during bolus administration of intravenous contrast. RADIATION DOSE REDUCTION: This exam was performed according to the departmental dose-optimization program which includes automated exposure control, adjustment of the mA and/or kV according to patient size and/or use of iterative reconstruction technique. CONTRAST:  120m OMNIPAQUE IOHEXOL 300 MG/ML  SOLN COMPARISON:  06/25/2021 PET FINDINGS: CT CHEST FINDINGS Cardiovascular: Right Port-A-Cath tip low SVC. Aortic atherosclerosis. Intramural hematoma within the proximal transverse aorta 22/2. Hyperattenuating on prior noncontrast imaging. Normal heart size, without pericardial effusion. Lad coronary artery calcification. No central pulmonary embolism, on this non-dedicated study. Mediastinum/Nodes: No supraclavicular adenopathy. No mediastinal or hilar adenopathy. Lungs/Pleura: No pleural fluid. The ill-defined posterior right upper lobe mixed attenuation lesion is again identified. Measures on the order of 3.3 x 2.8 cm on 49/4, similar to on the prior. Solid nodular component medially measures 11 mm on 50/4 versus 10 mm on 05/10/2021. Innumerable bilateral ground-glass nodules are again identified. An index superior segment right lower lobe ground-glass nodule measures 1.6 cm on 91/4 is similar 1.7 cm on the prior chest CT (when remeasured). Index lingular ground-glass nodule of 8 mm on 81/4 is similar to on the prior. There also scattered tiny solid  nodules primarily in the right upper lobe, for example at 3 mm on 36/4, which are similar. Musculoskeletal: T9 compression deformity is mild and similar to on the prior. CT ABDOMEN PELVIS FINDINGS Hepatobiliary: No focal liver lesion. No gallstones or acute cholecystitis. Development of moderate intrahepatic biliary duct dilatation. The common duct measures 1.4 cm on 67/2. Multiple noncalcified stones are identified within, including at 1.4 cm on 69/2. Pancreas: No pancreatic duct dilatation or acute pancreatitis. Spleen: Normal in size, without focal abnormality. Adrenals/Urinary Tract: Normal adrenal glands. Normal kidneys, without hydronephrosis. Normal urinary bladder. Stomach/Bowel: Normal stomach, without wall thickening. Extensive colonic diverticulosis. Normal terminal ileum. Normal small bowel. Vascular/Lymphatic: Aortic atherosclerosis. Significant stenosis of the left femoral artery including on 100/2 and coronal image 62. No abdominopelvic adenopathy. Reproductive: Normal uterus and adnexa. Other: No significant free fluid. Mild pelvic floor laxity. No evidence of omental or peritoneal disease. Musculoskeletal: Osteopenia. IMPRESSION: 1. Similar appearance of posterior right upper lobe mixed attenuation lung mass. 2. Innumerable bilateral ground-glass nodules are also not significantly changed. 3. No thoracic adenopathy. 4. Choledocholithiasis with development of biliary duct dilatation. These results will be called to the ordering clinician or representative by the Radiologist Assistant, and communication documented in the PACS or [employer] 5. Aortic atherosclerosis (ICD10-I70.0), coronary artery atherosclerosis and emphysema (ICD10-J43.9). 6. Intramural hematoma within the proximal transverse aorta. Marked stenosis of the left femoral artery. Electronically Signed   By:  Abigail Miyamoto M.D.   On: 10/04/2021 09:09     ASSESSMENT/PLAN:  This very pleasant 66 year old Caucasian female diagnosed  with stage IV (T2 a, N0, M1 a) non-small cell lung cancer, adenocarcinoma.  The patient presented multifocal bilateral pulmonary nodules involving the right upper lobe as well as small bilateral groundglass opacities.  She was diagnosed in April 2023.  She is positive for a K-ras G12 C mutation and her PD-L1 expression of 4%.  Her K-ras G12 C mutation can be targeted in the second line setting in the future.  The patient is currently undergoing systemic chemotherapy with carboplatin for an AUC of 5, Alimta 500 mg per metered square, Keytruda 200 mg IV every 3 weeks.  She is status post 3 cycles.  She tolerates this fairly well.  She recently had a restaging CT scan. Dr. Julien Nordmann personally and independently reviewed the scan and discussed the results with the patient. The scan showed no evidence of disease progression.  She continues to have stable bilateral lung nodules. Dr. Julien Nordmann recommends that she proceed with cycle #4 today as scheduled.   Scan incidentally noted choledocholithiasis.  The patient denies any symptoms except she did have a few episodes of lighter stools.  I discussed with the patient concerning signs and symptoms that would warrant immediate evaluation would include fever, chills, unusual nausea/vomiting, abdominal pain, jaundice, or itching.  Discussed risk factors of choledocholithiasis to include acute cholangitis or pancreatitis.  Dr. Julien Nordmann recommends referring the patient to GI for management and monitoring of this.  We will see her back for follow-up visit in 3 weeks (11/02/21) for evaluation and repeat blood work before undergoing cycle #5. The patient is scheduled to see my colleague, Mendel Ryder, NP at her next appointment for a toxicity check.  The patient will start maintenance Alimta and keytruda at her next appointment. Therefore, she will no longer need weekly labs after cycle #5 unless he has concerning findings that warrant closer monitoring at that appointment.  However,  we will need to monitor her labs closely every week until she is seen next time.  Discussed if her [place] got below parameters we may call her back for injection to increase her white blood cell count such as Zarxio or Granix.  If her platelet count drop below 50,000, we may call her and ask her to hold her Eliquis.  Patient has bilateral lower extremity swelling which has been present for several months.  The patient believes this is worsening.  She is concerned about possible blood clot.  She denies any calf pain.  Will arrange for bilateral lower extremity ultrasound to rule out DVT.  Should she be positive for DVT, we will likely need to change her anticoagulation to Lovenox.  If she is negative for DVT, discussed with the patient that I would encourage her to purchase some compression stockings, elevate her lower extremities, increase her protein intake, and decrease her intake of salty foods.  The patient was advised to call immediately if she has any concerning symptoms in the interval. The patient voices understanding of current disease status and treatment options and is in agreement with the current care plan. All questions were answered. The patient knows to call the clinic with any problems, questions or concerns. We can certainly see the patient much sooner if necessary   No orders of the defined types were placed in this encounter.     Bernadett Milian L Ramonia Mcclaran, PA-C 10/05/21  ADDENDUM: Hematology/Oncology Attending: I had a face-to-face  encounter with the patient today.  I reviewed her records, lab, scan and recommended her care plan.  This is a very pleasant 66 years old white female with stage IV non-small cell lung cancer, adenocarcinoma presented with multifocal bilateral pulmonary nodules diagnosed in April 2023 was positive KRAS G12C mutation and PD-L1 expression of 4%. The patient is currently undergoing systemic chemotherapy with carboplatin, Alimta and Keytruda status post 3  cycles.  She has been tolerating this treatment well except for fatigue. She had repeat CT scan of the chest, abdomen and pelvis performed recently.  I personally and independently reviewed the scan images and discussed the result with the patient and her daughter today. Her scan showed no concerning findings for disease progression and there was a stable disease involving the multifocal pulmonary nodules.  The scan showed incidental choledocholithiasis but the patient is currently asymptomatic. I recommended for her to proceed with cycle #4 today as planned.  Starting from cycle #5 she will be on maintenance treatment and Keytruda every 3 weeks. We will refer the patient to gastroenterology for evaluation of the incidental finding of choledocholithiasis. She will come back for follow-up visit in 3 weeks for evaluation before the next cycle of her treatment. The patient was advised to call immediately if she has any other concerning symptoms in the interval.  The total time spent in the appointment was 30 minutes. Disclaimer: This note was dictated with voice recognition software. Similar sounding words can inadvertently be transcribed and may be missed upon review. Eilleen Kempf, MD 10/12/21

## 2021-10-11 MED FILL — Fosaprepitant Dimeglumine For IV Infusion 150 MG (Base Eq): INTRAVENOUS | Qty: 5 | Status: AC

## 2021-10-11 MED FILL — Dexamethasone Sodium Phosphate Inj 100 MG/10ML: INTRAMUSCULAR | Qty: 1 | Status: AC

## 2021-10-12 ENCOUNTER — Inpatient Hospital Stay (HOSPITAL_BASED_OUTPATIENT_CLINIC_OR_DEPARTMENT_OTHER): Payer: BC Managed Care – PPO | Admitting: Physician Assistant

## 2021-10-12 ENCOUNTER — Other Ambulatory Visit: Payer: BC Managed Care – PPO

## 2021-10-12 ENCOUNTER — Inpatient Hospital Stay: Payer: BC Managed Care – PPO

## 2021-10-12 ENCOUNTER — Inpatient Hospital Stay: Payer: BC Managed Care – PPO | Admitting: Nutrition

## 2021-10-12 VITALS — BP 128/77 | HR 85 | Temp 97.9°F | Resp 16

## 2021-10-12 VITALS — BP 132/79 | HR 79 | Temp 97.5°F | Resp 16 | Ht 63.0 in | Wt 100.7 lb

## 2021-10-12 DIAGNOSIS — M7989 Other specified soft tissue disorders: Secondary | ICD-10-CM | POA: Diagnosis not present

## 2021-10-12 DIAGNOSIS — C3491 Malignant neoplasm of unspecified part of right bronchus or lung: Secondary | ICD-10-CM | POA: Diagnosis not present

## 2021-10-12 DIAGNOSIS — C3411 Malignant neoplasm of upper lobe, right bronchus or lung: Secondary | ICD-10-CM | POA: Diagnosis not present

## 2021-10-12 DIAGNOSIS — E538 Deficiency of other specified B group vitamins: Secondary | ICD-10-CM | POA: Diagnosis not present

## 2021-10-12 DIAGNOSIS — Z5111 Encounter for antineoplastic chemotherapy: Secondary | ICD-10-CM

## 2021-10-12 DIAGNOSIS — Z79899 Other long term (current) drug therapy: Secondary | ICD-10-CM | POA: Diagnosis not present

## 2021-10-12 DIAGNOSIS — Z95828 Presence of other vascular implants and grafts: Secondary | ICD-10-CM

## 2021-10-12 DIAGNOSIS — K8050 Calculus of bile duct without cholangitis or cholecystitis without obstruction: Secondary | ICD-10-CM | POA: Diagnosis not present

## 2021-10-12 DIAGNOSIS — Z5112 Encounter for antineoplastic immunotherapy: Secondary | ICD-10-CM | POA: Diagnosis not present

## 2021-10-12 LAB — CBC WITH DIFFERENTIAL (CANCER CENTER ONLY)
Abs Immature Granulocytes: 0.08 10*3/uL — ABNORMAL HIGH (ref 0.00–0.07)
Basophils Absolute: 0 10*3/uL (ref 0.0–0.1)
Basophils Relative: 1 %
Eosinophils Absolute: 0.4 10*3/uL (ref 0.0–0.5)
Eosinophils Relative: 5 %
HCT: 27.9 % — ABNORMAL LOW (ref 36.0–46.0)
Hemoglobin: 10 g/dL — ABNORMAL LOW (ref 12.0–15.0)
Immature Granulocytes: 1 %
Lymphocytes Relative: 38 %
Lymphs Abs: 2.5 10*3/uL (ref 0.7–4.0)
MCH: 33.8 pg (ref 26.0–34.0)
MCHC: 35.8 g/dL (ref 30.0–36.0)
MCV: 94.3 fL (ref 80.0–100.0)
Monocytes Absolute: 1.2 10*3/uL — ABNORMAL HIGH (ref 0.1–1.0)
Monocytes Relative: 19 %
Neutro Abs: 2.3 10*3/uL (ref 1.7–7.7)
Neutrophils Relative %: 36 %
Platelet Count: 279 10*3/uL (ref 150–400)
RBC: 2.96 MIL/uL — ABNORMAL LOW (ref 3.87–5.11)
RDW: 14.8 % (ref 11.5–15.5)
WBC Count: 6.4 10*3/uL (ref 4.0–10.5)
nRBC: 0 % (ref 0.0–0.2)

## 2021-10-12 LAB — CMP (CANCER CENTER ONLY)
ALT: 24 U/L (ref 0–44)
AST: 18 U/L (ref 15–41)
Albumin: 3.9 g/dL (ref 3.5–5.0)
Alkaline Phosphatase: 220 U/L — ABNORMAL HIGH (ref 38–126)
Anion gap: 4 — ABNORMAL LOW (ref 5–15)
BUN: 8 mg/dL (ref 8–23)
CO2: 29 mmol/L (ref 22–32)
Calcium: 9.2 mg/dL (ref 8.9–10.3)
Chloride: 107 mmol/L (ref 98–111)
Creatinine: 0.73 mg/dL (ref 0.44–1.00)
GFR, Estimated: >60 mL/min (ref >60)
Glucose, Bld: 92 mg/dL (ref 70–99)
Potassium: 3.4 mmol/L — ABNORMAL LOW (ref 3.5–5.1)
Sodium: 140 mmol/L (ref 135–145)
Total Bilirubin: 0.3 mg/dL (ref 0.3–1.2)
Total Protein: 7.1 g/dL (ref 6.5–8.1)

## 2021-10-12 LAB — TSH: TSH: 5.122 u[IU]/mL — ABNORMAL HIGH (ref 0.350–4.500)

## 2021-10-12 MED ORDER — SODIUM CHLORIDE 0.9 % IV SOLN: Freq: Once | INTRAVENOUS | Status: AC

## 2021-10-12 MED ORDER — SODIUM CHLORIDE 0.9 % IV SOLN
330.0000 mg | Freq: Once | INTRAVENOUS | Status: AC
  Administered 2021-10-12: 330 mg via INTRAVENOUS
  Filled 2021-10-12: qty 33

## 2021-10-12 MED ORDER — SODIUM CHLORIDE 0.9 % IV SOLN
150.0000 mg | Freq: Once | INTRAVENOUS | Status: AC
  Administered 2021-10-12: 150 mg via INTRAVENOUS
  Filled 2021-10-12: qty 150

## 2021-10-12 MED ORDER — SODIUM CHLORIDE 0.9 % IV SOLN
200.0000 mg | Freq: Once | INTRAVENOUS | Status: AC
  Administered 2021-10-12: 200 mg via INTRAVENOUS
  Filled 2021-10-12: qty 200

## 2021-10-12 MED ORDER — PALONOSETRON HCL INJECTION 0.25 MG/5ML
0.2500 mg | Freq: Once | INTRAVENOUS | Status: AC
  Administered 2021-10-12: 0.25 mg via INTRAVENOUS
  Filled 2021-10-12: qty 5

## 2021-10-12 MED ORDER — SODIUM CHLORIDE 0.9% FLUSH
10.0000 mL | Freq: Once | INTRAVENOUS | Status: AC
  Administered 2021-10-12: 10 mL

## 2021-10-12 MED ORDER — HEPARIN SOD (PORK) LOCK FLUSH 100 UNIT/ML IV SOLN: 500.0000 [IU] | Freq: Once | INTRAVENOUS | Status: DC | PRN

## 2021-10-12 MED ORDER — SODIUM CHLORIDE 0.9 % IV SOLN
500.0000 mg/m2 | Freq: Once | INTRAVENOUS | Status: AC
  Administered 2021-10-12: 700 mg via INTRAVENOUS
  Filled 2021-10-12: qty 20

## 2021-10-12 MED ORDER — SODIUM CHLORIDE 0.9 % IV SOLN
10.0000 mg | Freq: Once | INTRAVENOUS | Status: AC
  Administered 2021-10-12: 10 mg via INTRAVENOUS
  Filled 2021-10-12: qty 10

## 2021-10-12 MED ORDER — SODIUM CHLORIDE 0.9% FLUSH: 10.0000 mL | INTRAVENOUS | Status: DC | PRN

## 2021-10-12 NOTE — Patient Instructions (Signed)
[facility]  Discharge Instructions: Thank you for choosing [facility] to provide your oncology and hematology care.   If you have a lab appointment with the [facility], please go directly to the [facility] and check in at the registration area.   Wear comfortable clothing and clothing appropriate for easy access to any Portacath or PICC line.   We strive to give you quality time with your provider. You may need to reschedule your appointment if you arrive late (15 or more minutes).  Arriving late affects you and other patients whose appointments are after yours.  Also, if you miss three or more appointments without notifying the office, you may be dismissed from the clinic at the provider's discretion.      For prescription refill requests, have your pharmacy contact our office and allow 72 hours for refills to be completed.    Today you received the following chemotherapy and/or immunotherapy agents: Keytruda, Pemetrexed, Carboplatin.      To help prevent nausea and vomiting after your treatment, we encourage you to take your nausea medication as directed.  BELOW ARE SYMPTOMS THAT SHOULD BE REPORTED IMMEDIATELY: *FEVER GREATER THAN 100.4 F (38 C) OR HIGHER *CHILLS OR SWEATING *NAUSEA AND VOMITING THAT IS NOT CONTROLLED WITH YOUR NAUSEA MEDICATION *UNUSUAL SHORTNESS OF BREATH *UNUSUAL BRUISING OR BLEEDING *URINARY PROBLEMS (pain or burning when urinating, or frequent urination) *BOWEL PROBLEMS (unusual diarrhea, constipation, pain near the anus) TENDERNESS IN MOUTH AND THROAT WITH OR WITHOUT PRESENCE OF ULCERS (sore throat, sores in mouth, or a toothache) UNUSUAL RASH, SWELLING OR PAIN  UNUSUAL VAGINAL DISCHARGE OR ITCHING   Items with * indicate a potential emergency and should be followed up as soon as possible or go to the Emergency Department if any problems should occur.  Please show the CHEMOTHERAPY ALERT CARD or IMMUNOTHERAPY  ALERT CARD at check-in to the Emergency Department and triage nurse.  Should you have questions after your visit or need to cancel or reschedule your appointment, please contact [facility]  Dept: [phone]  and follow the prompts.  Office hours are 8:00 a.m. to 4:30 p.m. Monday - Friday. Please note that voicemails left after 4:00 p.m. may not be returned until the following business day.  We are closed weekends and major holidays. You have access to a nurse at all times for urgent questions. Please call the main number to the clinic Dept: [phone] and follow the prompts.   For any non-urgent questions, you may also contact your provider using MyChart. We now offer e-Visits for anyone 65 and older to request care online for non-urgent symptoms. For details visit [email or web address].   Also download the MyChart app! Go to the app store, search "MyChart", open the app, select [facility], and log in with your MyChart username and password.  Masks are optional in the cancer centers. If you would like for your care team to wear a mask while they are taking care of you, please let them know. You may have one support person who is at least 66 years old accompany you for your appointments.

## 2021-10-12 NOTE — Progress Notes (Signed)
Nutrition follow-up completed with patient during infusion for stage IV lung cancer.  Noted weight increase slightly to 100 pounds 11 ounces August 22.  Noted glucose 113.  Patient reports she is eating normally for her.  She denies nutrition impact symptoms.  Reports she eats a lot more sweets than she used to.  She has not tried oral nutrition supplements.  Nutrition diagnosis: Food and nutrition related knowledge deficit improved.  Intervention: Provided a sample of Ensure complete for patient to try and encouraged 1 daily.  This will provide an additional 350 cal and 30 g protein. Educated on strategies for better tolerance. Encouraged small frequent meals.  Monitoring, evaluation, goals: Patient will tolerate adequate calories and protein for weight maintenance.  Next visit: To be scheduled as needed.  **Disclaimer: This note was dictated with voice recognition software. Similar sounding words can inadvertently be transcribed and this note may contain transcription errors which may not have been corrected upon publication of note.**

## 2021-10-14 ENCOUNTER — Ambulatory Visit (HOSPITAL_COMMUNITY)
Admission: RE | Admit: 2021-10-14 | Discharge: 2021-10-14 | Disposition: A | Discharge status: Home / Self Care | Payer: BC Managed Care – PPO | Source: Ambulatory Visit | Attending: Physician Assistant | Admitting: Physician Assistant

## 2021-10-14 ENCOUNTER — Other Ambulatory Visit: Payer: Self-pay | Admitting: Physician Assistant

## 2021-10-14 DIAGNOSIS — M7989 Other specified soft tissue disorders: Secondary | ICD-10-CM | POA: Insufficient documentation

## 2021-10-14 NOTE — Progress Notes (Signed)
The patient had doppler ultrasound today for bilateral lower extremity swelling. The left leg did not show clot. The right leg shows chronic deep vein thrombus in right popliteal vein and involving right gastrocnemius veins and rt peroneal veins. She was found to have acute DVT in the right popliteal vein in December 2022. She is taking Eliquis and is compliant. I reviewed this with Dr. Julien Nordmann. Dr. Julien Nordmann recommended continuing eliquis for now; however, gave the patient the option of switching to lovenox. The patient would like to continue eliquis for now. Should she develops new or worsening symptoms such as shortness of breath, chest pain, etc, she knows to seek emergency evaluation. Of note, the patient had CT chest with IV contrast a few days ago without evidence of PE. Also if she developed worsening lower extremity swelling, pain, erythema, etc, to seek medical evaluation. I called the patient and relayed this to her and she is in agreement with the plan.

## 2021-10-14 NOTE — Progress Notes (Signed)
Bilateral lower extremity venous duplex has been completed. Preliminary results can be found in CV Proc through chart review.  Results were given to Casas PA.  10/14/21 10:18 AM Carlos Levering RVT

## 2021-10-19 ENCOUNTER — Ambulatory Visit (INDEPENDENT_AMBULATORY_CARE_PROVIDER_SITE_OTHER): Payer: BC Managed Care – PPO | Admitting: Adult Health

## 2021-10-19 ENCOUNTER — Other Ambulatory Visit: Payer: Self-pay

## 2021-10-19 ENCOUNTER — Other Ambulatory Visit: Payer: BC Managed Care – PPO

## 2021-10-19 ENCOUNTER — Inpatient Hospital Stay: Payer: BC Managed Care – PPO

## 2021-10-19 ENCOUNTER — Telehealth: Payer: Self-pay

## 2021-10-19 ENCOUNTER — Encounter: Payer: Self-pay | Admitting: Adult Health

## 2021-10-19 VITALS — BP 140/90 | HR 102 | Temp 97.6°F | Ht 63.0 in | Wt 96.8 lb

## 2021-10-19 DIAGNOSIS — R350 Frequency of micturition: Secondary | ICD-10-CM | POA: Diagnosis not present

## 2021-10-19 DIAGNOSIS — Z5111 Encounter for antineoplastic chemotherapy: Secondary | ICD-10-CM | POA: Diagnosis not present

## 2021-10-19 DIAGNOSIS — Z79899 Other long term (current) drug therapy: Secondary | ICD-10-CM | POA: Diagnosis not present

## 2021-10-19 DIAGNOSIS — E538 Deficiency of other specified B group vitamins: Secondary | ICD-10-CM | POA: Diagnosis not present

## 2021-10-19 DIAGNOSIS — C3411 Malignant neoplasm of upper lobe, right bronchus or lung: Secondary | ICD-10-CM | POA: Diagnosis not present

## 2021-10-19 DIAGNOSIS — Z5112 Encounter for antineoplastic immunotherapy: Secondary | ICD-10-CM | POA: Diagnosis not present

## 2021-10-19 DIAGNOSIS — C3491 Malignant neoplasm of unspecified part of right bronchus or lung: Secondary | ICD-10-CM

## 2021-10-19 DIAGNOSIS — Z95828 Presence of other vascular implants and grafts: Secondary | ICD-10-CM

## 2021-10-19 LAB — POCT URINALYSIS DIPSTICK
Bilirubin, UA: NEGATIVE
Blood, UA: NEGATIVE
Glucose, UA: NEGATIVE
Ketones, UA: NEGATIVE
Nitrite, UA: NEGATIVE
Protein, UA: POSITIVE — AB
Spec Grav, UA: 1.015 (ref 1.010–1.025)
Urobilinogen, UA: 0.2 E.U./dL
pH, UA: 6 (ref 5.0–8.0)

## 2021-10-19 LAB — CBC WITH DIFFERENTIAL (CANCER CENTER ONLY)
Abs Immature Granulocytes: 0.01 10*3/uL (ref 0.00–0.07)
Basophils Absolute: 0 10*3/uL (ref 0.0–0.1)
Basophils Relative: 0 %
Eosinophils Absolute: 0.1 10*3/uL (ref 0.0–0.5)
Eosinophils Relative: 4 %
HCT: 23.8 % — ABNORMAL LOW (ref 36.0–46.0)
Hemoglobin: 8.5 g/dL — ABNORMAL LOW (ref 12.0–15.0)
Immature Granulocytes: 0 %
Lymphocytes Relative: 51 %
Lymphs Abs: 1.8 10*3/uL (ref 0.7–4.0)
MCH: 33.2 pg (ref 26.0–34.0)
MCHC: 35.7 g/dL (ref 30.0–36.0)
MCV: 93 fL (ref 80.0–100.0)
Monocytes Absolute: 0.1 10*3/uL (ref 0.1–1.0)
Monocytes Relative: 3 %
Neutro Abs: 1.5 10*3/uL — ABNORMAL LOW (ref 1.7–7.7)
Neutrophils Relative %: 42 %
Platelet Count: 189 10*3/uL (ref 150–400)
RBC: 2.56 MIL/uL — ABNORMAL LOW (ref 3.87–5.11)
RDW: 14.3 % (ref 11.5–15.5)
Smear Review: NORMAL
WBC Count: 3.5 10*3/uL — ABNORMAL LOW (ref 4.0–10.5)
nRBC: 0 % (ref 0.0–0.2)

## 2021-10-19 LAB — CMP (CANCER CENTER ONLY)
ALT: 47 U/L — ABNORMAL HIGH (ref 0–44)
AST: 25 U/L (ref 15–41)
Albumin: 4.1 g/dL (ref 3.5–5.0)
Alkaline Phosphatase: 216 U/L — ABNORMAL HIGH (ref 38–126)
Anion gap: 5 (ref 5–15)
BUN: 18 mg/dL (ref 8–23)
CO2: 29 mmol/L (ref 22–32)
Calcium: 9.6 mg/dL (ref 8.9–10.3)
Chloride: 102 mmol/L (ref 98–111)
Creatinine: 0.52 mg/dL (ref 0.44–1.00)
GFR, Estimated: >60 mL/min (ref >60)
Glucose, Bld: 100 mg/dL — ABNORMAL HIGH (ref 70–99)
Potassium: 3.3 mmol/L — ABNORMAL LOW (ref 3.5–5.1)
Sodium: 136 mmol/L (ref 135–145)
Total Bilirubin: 0.8 mg/dL (ref 0.3–1.2)
Total Protein: 7.2 g/dL (ref 6.5–8.1)

## 2021-10-19 MED ORDER — SODIUM CHLORIDE 0.9% FLUSH
10.0000 mL | Freq: Once | INTRAVENOUS | Status: AC
  Administered 2021-10-19: 10 mL

## 2021-10-19 MED ORDER — HEPARIN SOD (PORK) LOCK FLUSH 100 UNIT/ML IV SOLN
500.0000 [IU] | Freq: Once | INTRAVENOUS | Status: AC
  Administered 2021-10-19: 500 [IU]

## 2021-10-19 NOTE — Telephone Encounter (Signed)
Received message via flush nurse, Baxter Flattery, that daughter was concerned about patient being dehydrated. Daughter requesting IV fluids. CMP resulted with no concerning findings. Called daughter and left VM encouraging patient to increase PO fluid intake. Requested that she call clinic if patient is unable to keep fluids down or if there are further concerns.

## 2021-10-19 NOTE — Progress Notes (Signed)
Subjective:    Patient ID: Sue Chase, female    DOB: 1955-07-15, 66 y.o.   MRN: [id number]  HPI 66 year old female who  has a past medical history of Colon polyps, Complication of anesthesia, Hyperlipidemia, Rheumatic fever, Seasonal allergies, and UTI (urinary tract infection).  She is currently going through chemo therapy. For adenocarcinoma of right lung. She reports that after the second round of chemotherapy she will have frequent urination after chemotherapy. She denies low back pain or lower pelvic pain. Denies hematuria but maybe is feeling some dysuria.    Review of Systems See HPI   Past Medical History:  Diagnosis Date   Colon polyps    Complication of anesthesia    tolerated propofol but other meds made her emotional / cry/ difficulty breathing/ panic   Hyperlipidemia    Rheumatic fever    Seasonal allergies    UTI (urinary tract infection)     Social History   Socioeconomic History   Marital status: Widowed    Spouse name: Not on file   Number of children: Not on file   Years of education: Not on file   Highest education level: Some college, no degree  Occupational History   Not on file  Tobacco Use   Smoking status: Some Days    Packs/day: 1.00    Years: 40.00    Total pack years: 40.00    Types: Cigarettes    Passive exposure: Never   Smokeless tobacco: Never   Tobacco comments:    1/2 ppd or less 06/15/21. Hsm/.   Vaping Use   Vaping Use: Never used  Substance and Sexual Activity   Alcohol use: Yes    Comment: Rum and Coke/Unsure of amount   Drug use: Never   Sexual activity: Not on file  Other Topics Concern   Not on file  Social History Narrative   Married    Two grown children    She enjoys reading, walking   Social Determinants of Health   Financial Resource Strain: Medium Risk (10/18/2021)   Overall Financial Resource Strain (CARDIA)    Difficulty of Paying Living Expenses: Somewhat hard  Food Insecurity: Unknown  (10/18/2021)   Hunger Vital Sign    Worried About Running Out of Food in the Last Year: Patient refused    [place] in the Last Year: Patient refused  Transportation Needs: No Transportation Needs (10/18/2021)   PRAPARE - Hydrologist (Medical): No    Lack of Transportation (Non-Medical): No  Physical Activity: Unknown (10/18/2021)   Exercise Vital Sign    Days of Exercise per Week: Patient refused    Minutes of Exercise per Session: Not on file  Stress: No Stress Concern Present (10/18/2021)   [place]    Feeling of Stress : Only a little  Social Connections: Unknown (10/18/2021)   Social Connection and Isolation Panel [NHANES]    Frequency of Communication with Friends and Family: Three times a week    Frequency of Social Gatherings with Friends and Family: Patient refused    Attends Religious Services: Patient refused    Active Member of Clubs or Organizations: No    Attends Music therapist: Not on file    Marital Status: Widowed  Intimate Partner Violence: Not on file    Past Surgical History:  Procedure Laterality Date   BRONCHIAL BIOPSY  06/08/2021   Procedure: BRONCHIAL BIOPSIES;  Surgeon: Garner Nash, DO;  Location: [facility];  Service: Pulmonary;;   BRONCHIAL BRUSHINGS  06/08/2021   Procedure: BRONCHIAL BRUSHINGS;  Surgeon: Garner Nash, DO;  Location: [facility];  Service: Pulmonary;;   BRONCHIAL NEEDLE ASPIRATION BIOPSY  06/08/2021   Procedure: BRONCHIAL NEEDLE ASPIRATION BIOPSIES;  Surgeon: Garner Nash, DO;  Location: [place] [facility];  Service: Pulmonary;;   COLONOSCOPY  2018   IR IMAGING GUIDED PORT INSERTION  08/12/2021   SALIVARY GLAND SURGERY     LATE 80S   TUBAL LIGATION  1986   VIDEO BRONCHOSCOPY WITH RADIAL ENDOBRONCHIAL ULTRASOUND  06/08/2021   Procedure: VIDEO BRONCHOSCOPY WITH RADIAL ENDOBRONCHIAL ULTRASOUND;  Surgeon: Garner Nash, DO;  Location: [facility];  Service: Pulmonary;;    Family History  Problem Relation Age of Onset   Arthritis Mother    Diabetes Mother    Heart disease Mother    Hyperlipidemia Mother    Hypertension Mother    Miscarriages / [place] Mother    Hearing loss Father    Liver cancer Father    Lung cancer Father    Hyperlipidemia Father    Diabetes Brother    Lung disease Maternal Grandfather        Black Lung   Diabetes Paternal Grandmother    Cancer Paternal Grandfather    Diabetes Paternal Grandfather     Allergies  Allergen Reactions   Codeine Itching    Current Outpatient Medications on File Prior to Visit  Medication Sig Dispense Refill   albuterol (VENTOLIN HFA) 108 (90 Base) MCG/ACT inhaler Inhale 2 puffs into the lungs every 6 (six) hours as needed for wheezing or shortness of breath. 8 g 1   folic acid (FOLVITE) 1 MG tablet Take 1 tablet (1 mg total) by mouth daily. 30 tablet 4   lidocaine-prilocaine (EMLA) cream Apply to the Port-A-Cath site 30 minutes before chemotherapy 30 g 0   magnesium oxide (MAG-OX) 400 (240 Mg) MG tablet Take 400 mg by mouth daily.     mirtazapine (REMERON) 15 MG tablet Take 2 tablets (30 mg total) by mouth at bedtime. 30 tablet 2   omeprazole (PRILOSEC) 20 MG capsule Take 1 capsule (20 mg total) by mouth daily. 30 capsule 3   potassium chloride SA (KLOR-CON M) 20 MEQ tablet Take 1 tablet (20 mEq total) by mouth daily. 10 tablet 0   prochlorperazine (COMPAZINE) 10 MG tablet Take 1 tablet (10 mg total) by mouth every 6 (six) hours as needed for nausea or vomiting. 30 tablet 0   apixaban (ELIQUIS) 5 MG TABS tablet Take 1 tablet (5 mg total) by mouth 2 (two) times daily. 180 tablet 1   No current facility-administered medications on file prior to visit.    BP (!) 140/90   Pulse (!) 102   Temp 97.6 F (36.4 C) (Oral)   Ht 5\' 3"  (1.6 m)   Wt 96 lb 12.8 oz (43.9 kg)   SpO2 100%   BMI 17.15 kg/m       Objective:   Physical  Exam Vitals and nursing note reviewed.  Constitutional:      Appearance: Normal appearance.  Cardiovascular:     Rate and Rhythm: Normal rate and regular rhythm.     Pulses: Normal pulses.     Heart sounds: Normal heart sounds.  Skin:    General: Skin is warm and dry.  Neurological:     General: No focal deficit present.     Mental Status: She is  alert and oriented to person, place, and time.        Assessment & Plan:  1. Urinary frequency - POC Urinalysis Dipstick + leuks and protein. SG 1015  - Will send for culture.  - Encouraged to increase fluids  - Urine Culture; Future   [name], NP

## 2021-10-21 ENCOUNTER — Encounter: Payer: Self-pay | Admitting: Gastroenterology

## 2021-10-21 LAB — URINE CULTURE
MICRO NUMBER:: 13848294
Result:: NO GROWTH
SPECIMEN QUALITY:: ADEQUATE

## 2021-10-26 ENCOUNTER — Other Ambulatory Visit: Payer: Self-pay

## 2021-10-26 ENCOUNTER — Inpatient Hospital Stay: Payer: Medicare Other | Attending: Oncology

## 2021-10-26 ENCOUNTER — Other Ambulatory Visit: Payer: Self-pay | Admitting: Internal Medicine

## 2021-10-26 ENCOUNTER — Telehealth: Payer: Self-pay

## 2021-10-26 DIAGNOSIS — Z800 Family history of malignant neoplasm of digestive organs: Secondary | ICD-10-CM | POA: Diagnosis not present

## 2021-10-26 DIAGNOSIS — F17210 Nicotine dependence, cigarettes, uncomplicated: Secondary | ICD-10-CM | POA: Insufficient documentation

## 2021-10-26 DIAGNOSIS — C3491 Malignant neoplasm of unspecified part of right bronchus or lung: Secondary | ICD-10-CM

## 2021-10-26 DIAGNOSIS — C3411 Malignant neoplasm of upper lobe, right bronchus or lung: Secondary | ICD-10-CM | POA: Insufficient documentation

## 2021-10-26 DIAGNOSIS — Z801 Family history of malignant neoplasm of trachea, bronchus and lung: Secondary | ICD-10-CM | POA: Diagnosis not present

## 2021-10-26 DIAGNOSIS — D649 Anemia, unspecified: Secondary | ICD-10-CM | POA: Insufficient documentation

## 2021-10-26 DIAGNOSIS — K8051 Calculus of bile duct without cholangitis or cholecystitis with obstruction: Secondary | ICD-10-CM | POA: Insufficient documentation

## 2021-10-26 DIAGNOSIS — Z95828 Presence of other vascular implants and grafts: Secondary | ICD-10-CM

## 2021-10-26 LAB — CMP (CANCER CENTER ONLY)
ALT: 111 U/L — ABNORMAL HIGH (ref 0–44)
AST: 90 U/L — ABNORMAL HIGH (ref 15–41)
Albumin: 3.8 g/dL (ref 3.5–5.0)
Alkaline Phosphatase: 630 U/L — ABNORMAL HIGH (ref 38–126)
Anion gap: 7 (ref 5–15)
BUN: 14 mg/dL (ref 8–23)
CO2: 30 mmol/L (ref 22–32)
Calcium: 9.2 mg/dL (ref 8.9–10.3)
Chloride: 100 mmol/L (ref 98–111)
Creatinine: 0.69 mg/dL (ref 0.44–1.00)
GFR, Estimated: >60 mL/min (ref >60)
Glucose, Bld: 106 mg/dL — ABNORMAL HIGH (ref 70–99)
Potassium: 3.1 mmol/L — ABNORMAL LOW (ref 3.5–5.1)
Sodium: 137 mmol/L (ref 135–145)
Total Bilirubin: 1 mg/dL (ref 0.3–1.2)
Total Protein: 7.3 g/dL (ref 6.5–8.1)

## 2021-10-26 LAB — CBC WITH DIFFERENTIAL (CANCER CENTER ONLY)
Abs Immature Granulocytes: 0.03 10*3/uL (ref 0.00–0.07)
Basophils Absolute: 0 10*3/uL (ref 0.0–0.1)
Basophils Relative: 0 %
Eosinophils Absolute: 0.1 10*3/uL (ref 0.0–0.5)
Eosinophils Relative: 5 %
HCT: 17.8 % — ABNORMAL LOW (ref 36.0–46.0)
Hemoglobin: 6.5 g/dL — CL (ref 12.0–15.0)
Immature Granulocytes: 1 %
Lymphocytes Relative: 53 %
Lymphs Abs: 1.4 10*3/uL (ref 0.7–4.0)
MCH: 33.3 pg (ref 26.0–34.0)
MCHC: 36.5 g/dL — ABNORMAL HIGH (ref 30.0–36.0)
MCV: 91.3 fL (ref 80.0–100.0)
Monocytes Absolute: 0.6 10*3/uL (ref 0.1–1.0)
Monocytes Relative: 23 %
Neutro Abs: 0.5 10*3/uL — ABNORMAL LOW (ref 1.7–7.7)
Neutrophils Relative %: 18 %
Platelet Count: 51 10*3/uL — ABNORMAL LOW (ref 150–400)
RBC: 1.95 MIL/uL — ABNORMAL LOW (ref 3.87–5.11)
RDW: 14.3 % (ref 11.5–15.5)
WBC Count: 2.7 10*3/uL — ABNORMAL LOW (ref 4.0–10.5)
nRBC: 0 % (ref 0.0–0.2)

## 2021-10-26 MED ORDER — SODIUM CHLORIDE 0.9% FLUSH
10.0000 mL | Freq: Once | INTRAVENOUS | Status: AC
  Administered 2021-10-26: 10 mL

## 2021-10-26 NOTE — Telephone Encounter (Signed)
I have spoken with the pt and advised as indicated. Pt expressed understanding of this information. She is aware she needs to be here at 8am tomorrow for PF w/labs and transfusion to follow.

## 2021-10-26 NOTE — Telephone Encounter (Signed)
CRITICAL VALUE STICKER  CRITICAL VALUE: Hgb = 6.5  RECEIVER (on-site recipient of call): Yetta Glassman, CMA  DATE & TIME NOTIFIED: 10/26/21 at 3:18pm  MESSENGER (representative from lab): Rosann Auerbach  MD NOTIFIED: Julien Nordmann  TIME OF NOTIFICATION: 10/26/21 at 3:pm  RESPONSE: Notification given to Dr. Julien Nordmann who advises pt needs 2 units of PRBC's.

## 2021-10-27 ENCOUNTER — Other Ambulatory Visit: Payer: Self-pay

## 2021-10-27 ENCOUNTER — Inpatient Hospital Stay: Payer: Medicare Other

## 2021-10-27 DIAGNOSIS — C3491 Malignant neoplasm of unspecified part of right bronchus or lung: Secondary | ICD-10-CM

## 2021-10-27 DIAGNOSIS — C3411 Malignant neoplasm of upper lobe, right bronchus or lung: Secondary | ICD-10-CM | POA: Diagnosis not present

## 2021-10-27 DIAGNOSIS — Z95828 Presence of other vascular implants and grafts: Secondary | ICD-10-CM

## 2021-10-27 LAB — ABO/RH: ABO/RH(D): A POS

## 2021-10-27 LAB — PREPARE RBC (CROSSMATCH)

## 2021-10-27 MED ORDER — SODIUM CHLORIDE 0.9% FLUSH
10.0000 mL | INTRAVENOUS | Status: AC | PRN
  Administered 2021-10-27: 10 mL

## 2021-10-27 MED ORDER — HEPARIN SOD (PORK) LOCK FLUSH 100 UNIT/ML IV SOLN
250.0000 [IU] | INTRAVENOUS | Status: AC | PRN
  Administered 2021-10-27: 500 [IU]

## 2021-10-27 MED ORDER — SODIUM CHLORIDE 0.9% FLUSH
10.0000 mL | Freq: Once | INTRAVENOUS | Status: AC
  Administered 2021-10-27: 10 mL

## 2021-10-27 MED ORDER — SODIUM CHLORIDE 0.9% IV SOLUTION
250.0000 mL | Freq: Once | INTRAVENOUS | Status: AC
  Administered 2021-10-27: 250 mL via INTRAVENOUS

## 2021-10-27 MED ORDER — DIPHENHYDRAMINE HCL 25 MG PO CAPS
25.0000 mg | ORAL_CAPSULE | Freq: Once | ORAL | Status: AC
  Administered 2021-10-27: 25 mg via ORAL
  Filled 2021-10-27: qty 1

## 2021-10-27 MED ORDER — ACETAMINOPHEN 325 MG PO TABS
650.0000 mg | ORAL_TABLET | Freq: Once | ORAL | Status: AC
  Administered 2021-10-27: 650 mg via ORAL
  Filled 2021-10-27: qty 2

## 2021-10-27 NOTE — Patient Instructions (Signed)

## 2021-10-28 LAB — BPAM RBC
Blood Product Expiration Date: 202309252359
Blood Product Expiration Date: 202309252359
ISSUE DATE / TIME: 202309061003
ISSUE DATE / TIME: 202309061003
Unit Type and Rh: 6200
Unit Type and Rh: 6200

## 2021-10-28 LAB — TYPE AND SCREEN
ABO/RH(D): A POS
Antibody Screen: NEGATIVE
Unit division: 0
Unit division: 0

## 2021-11-02 ENCOUNTER — Inpatient Hospital Stay: Payer: Medicare Other

## 2021-11-02 ENCOUNTER — Telehealth: Payer: Self-pay | Admitting: Gastroenterology

## 2021-11-02 ENCOUNTER — Other Ambulatory Visit: Payer: Self-pay

## 2021-11-02 ENCOUNTER — Inpatient Hospital Stay (HOSPITAL_BASED_OUTPATIENT_CLINIC_OR_DEPARTMENT_OTHER): Payer: Medicare Other | Admitting: Adult Health

## 2021-11-02 VITALS — BP 133/72 | HR 72 | Temp 97.8°F | Resp 16 | Ht 63.0 in | Wt 99.5 lb

## 2021-11-02 DIAGNOSIS — Z95828 Presence of other vascular implants and grafts: Secondary | ICD-10-CM

## 2021-11-02 DIAGNOSIS — K831 Obstruction of bile duct: Secondary | ICD-10-CM | POA: Diagnosis not present

## 2021-11-02 DIAGNOSIS — C3491 Malignant neoplasm of unspecified part of right bronchus or lung: Secondary | ICD-10-CM

## 2021-11-02 DIAGNOSIS — K8071 Calculus of gallbladder and bile duct without cholecystitis with obstruction: Secondary | ICD-10-CM | POA: Diagnosis not present

## 2021-11-02 LAB — CBC WITH DIFFERENTIAL (CANCER CENTER ONLY)
Abs Immature Granulocytes: 0.11 10*3/uL — ABNORMAL HIGH (ref 0.00–0.07)
Basophils Absolute: 0.1 10*3/uL (ref 0.0–0.1)
Basophils Relative: 1 %
Eosinophils Absolute: 0.3 10*3/uL (ref 0.0–0.5)
Eosinophils Relative: 3 %
HCT: 30.4 % — ABNORMAL LOW (ref 36.0–46.0)
Hemoglobin: 10.5 g/dL — ABNORMAL LOW (ref 12.0–15.0)
Immature Granulocytes: 1 %
Lymphocytes Relative: 24 %
Lymphs Abs: 1.9 10*3/uL (ref 0.7–4.0)
MCH: 31.8 pg (ref 26.0–34.0)
MCHC: 34.5 g/dL (ref 30.0–36.0)
MCV: 92.1 fL (ref 80.0–100.0)
Monocytes Absolute: 1 10*3/uL (ref 0.1–1.0)
Monocytes Relative: 13 %
Neutro Abs: 4.3 10*3/uL (ref 1.7–7.7)
Neutrophils Relative %: 58 %
Platelet Count: 271 10*3/uL (ref 150–400)
RBC: 3.3 MIL/uL — ABNORMAL LOW (ref 3.87–5.11)
RDW: 17.2 % — ABNORMAL HIGH (ref 11.5–15.5)
WBC Count: 7.6 10*3/uL (ref 4.0–10.5)
nRBC: 0 % (ref 0.0–0.2)

## 2021-11-02 LAB — CMP (CANCER CENTER ONLY)
ALT: 65 U/L — ABNORMAL HIGH (ref 0–44)
AST: 53 U/L — ABNORMAL HIGH (ref 15–41)
Albumin: 3.6 g/dL (ref 3.5–5.0)
Alkaline Phosphatase: 562 U/L — ABNORMAL HIGH (ref 38–126)
Anion gap: 7 (ref 5–15)
BUN: 12 mg/dL (ref 8–23)
CO2: 29 mmol/L (ref 22–32)
Calcium: 9.4 mg/dL (ref 8.9–10.3)
Chloride: 102 mmol/L (ref 98–111)
Creatinine: 0.68 mg/dL (ref 0.44–1.00)
GFR, Estimated: >60 mL/min (ref >60)
Glucose, Bld: 115 mg/dL — ABNORMAL HIGH (ref 70–99)
Potassium: 3.1 mmol/L — ABNORMAL LOW (ref 3.5–5.1)
Sodium: 138 mmol/L (ref 135–145)
Total Bilirubin: 1.4 mg/dL — ABNORMAL HIGH (ref 0.3–1.2)
Total Protein: 7.2 g/dL (ref 6.5–8.1)

## 2021-11-02 MED ORDER — SODIUM CHLORIDE 0.9% FLUSH
10.0000 mL | Freq: Once | INTRAVENOUS | Status: AC
  Administered 2021-11-02: 10 mL

## 2021-11-02 MED ORDER — HEPARIN SOD (PORK) LOCK FLUSH 100 UNIT/ML IV SOLN
500.0000 [IU] | Freq: Once | INTRAVENOUS | Status: AC
  Administered 2021-11-02: 500 [IU]

## 2021-11-02 NOTE — Telephone Encounter (Signed)
I was contacted by this patient's oncologist today.  The patient had an appointment to see me as an outpatient later this month.  She was recently diagnosed with metastatic lung cancer and was incidentally found to have choledocholithiasis on her staging CT last month.  Initially she did not report symptoms, but today when she presented for her chemotherapy appointment today, she reported having an episode of severe abdominal pain with nausea and vomiting a few days ago.  She reports that she has been having these episodes intermittently for the past year, although they have been more frequent recently. Additionally, she reports having light-colored/tan stools for the past few days.  She was noted to have liver enzyme elevation in a mixed pattern today with alk phos 562 and total bili of 1.4.  Her chemotherapy that was scheduled today was canceled because of this.  I spoke with the patient over the phone and explained to her that she does need to have an ERCP to extract the stones from the common bile duct.  Additionally, she would be recommended to have her gallbladder removed, although I do not know that this would necessarily be performed while she is undergoing chemotherapy.  Although it is likely that she has had symptomatic choledocholithiasis for the past year given her reported symptoms, her symptoms are increasing in frequency, and with her elevated liver enzymes, she is at risk for obstruction and possible cholangitis.  Although ERCP could be considered as an outpatient, the timing of this would be uncertain, and I recommended that she proceed to the ER for reevaluation of choledocholithiasis, possible admission and expedited ERCP and possible cholecystectomy.  The patient is on Eliquis for history of DVT, which will need to be held prior to her ERCP.

## 2021-11-02 NOTE — Assessment & Plan Note (Addendum)
Sue Chase is here today for follow-up and evaluation prior to receiving Alimta and Keytruda.  She has started to experience worsening in her GI symptoms from her choledocholithiasis.  Her initial appointment was scheduled for September 26 with GI.  I reached out to Dr. Candis Schatz to see if they can expedite her appointment.    We will hold her treatment today so that she can deal with this acute issue with ERCP and cholecystectomy if needed.  She will return in 3 weeks for labs, f/u, and to potentially resume her treatment.

## 2021-11-02 NOTE — Patient Instructions (Signed)

## 2021-11-02 NOTE — Progress Notes (Signed)
[facility] Follow up:    [name], NP [place] [address]   DIAGNOSIS:  Cancer Staging  Adenocarcinoma of right lung, stage 4 ([facility]) Staging form: Lung, AJCC 8th Edition - Clinical: Stage IVA (cT2a, cN0, cM1a) - Signed by Curt Bears, MD on 07/06/2021    SUMMARY OF ONCOLOGIC HISTORY: Oncology History  Adenocarcinoma of right lung, stage 4 ([facility])  05/2021 Initial Diagnosis   Stage IV (T2a, N0, M1a) non-small cell lung cancer, adenocarcinoma presented with multifocal bilateral pulmonary nodules involving the right upper lobe as well as the smaller bilateral groundglass opacities diagnosed in April 2023.  PD-L1 expression is 4%.  Molecular studies by [employer] tissue test showed positive KRAS G12C mutation but the blood test failed secondary to insufficient circulating tumor DNA.   07/06/2021 Cancer Staging   Staging form: Lung, AJCC 8th Edition - Clinical: Stage IVA (cT2a, cN0, cM1a) - Signed by Curt Bears, MD on 07/06/2021   08/10/2021 -  Chemotherapy   Patient is on Treatment Plan : LUNG Carboplatin (5) + Pemetrexed (500) + Pembrolizumab (200) D1 q21d Induction x 4 cycles / Maintenance Pemetrexed (500) + Pembrolizumab (200) D1 q21d       CURRENT THERAPY: Beginning maintenance therapy with Alimta and Keytruda  INTERVAL HISTORY: Sue Chase 66 y.o. female returns for follow-up and evaluation prior to receiving Alimta and Keytruda today.  She most recently underwent restaging CT chest abdomen pelvis about 4 weeks ago.  Though there were no signs of cancer recurrence she did have incidental finding of choledocholithiasis with biliary ductal dilation and was recommended to see GI.  This is scheduled for November 16, 2021.  This past week she has experienced increased GI symptoms with pain in her RUQ and in her back and nausea followed by vomiting. She has developed lightening in her stools as well.     Patient  Active Problem List   Diagnosis Date Noted   Biliary obstruction 11/03/2021   Choledocholithiasis 11/03/2021   DVT (deep venous thrombosis) ([place]) 11/03/2021   GERD (gastroesophageal reflux disease) 11/03/2021   Hypokalemia 11/03/2021   Normocytic anemia 11/03/2021   Port-A-Cath in place 08/17/2021   Encounter for antineoplastic immunotherapy 08/03/2021   Adenocarcinoma of right lung, stage 4 ([facility]) 07/06/2021   Encounter for antineoplastic chemotherapy 07/06/2021   Lung nodule, multiple 05/17/2021   Acute pulmonary embolism ([place]) 02/19/2021   Lower extremity edema 06/29/2017   Hyperlipidemia 06/29/2017   Tobacco use 06/29/2017    is allergic to codeine.  MEDICAL HISTORY: Past Medical History:  Diagnosis Date   Colon polyps    Complication of anesthesia    tolerated propofol but other meds made her emotional / cry/ difficulty breathing/ panic   Hyperlipidemia    Rheumatic fever    Seasonal allergies    UTI (urinary tract infection)     SURGICAL HISTORY: Past Surgical History:  Procedure Laterality Date   BRONCHIAL BIOPSY  06/08/2021   Procedure: BRONCHIAL BIOPSIES;  Surgeon: Garner Nash, DO;  Location: [facility];  Service: Pulmonary;;   BRONCHIAL BRUSHINGS  06/08/2021   Procedure: BRONCHIAL BRUSHINGS;  Surgeon: Garner Nash, DO;  Location: [facility];  Service: Pulmonary;;   BRONCHIAL NEEDLE ASPIRATION BIOPSY  06/08/2021   Procedure: BRONCHIAL NEEDLE ASPIRATION BIOPSIES;  Surgeon: Garner Nash, DO;  Location: [facility];  Service: Pulmonary;;   COLONOSCOPY  2018   IR IMAGING GUIDED PORT INSERTION  08/12/2021   SALIVARY GLAND SURGERY  LATE 80S   TUBAL LIGATION  1986   VIDEO BRONCHOSCOPY WITH RADIAL ENDOBRONCHIAL ULTRASOUND  06/08/2021   Procedure: VIDEO BRONCHOSCOPY WITH RADIAL ENDOBRONCHIAL ULTRASOUND;  Surgeon: Garner Nash, DO;  Location: [facility];  Service: Pulmonary;;    SOCIAL HISTORY: Social History   Socioeconomic History   Marital  status: Widowed    Spouse name: Not on file   Number of children: Not on file   Years of education: Not on file   Highest education level: Some college, no degree  Occupational History   Not on file  Tobacco Use   Smoking status: Some Days    Packs/day: 1.00    Years: 40.00    Total pack years: 40.00    Types: Cigarettes    Passive exposure: Never   Smokeless tobacco: Never   Tobacco comments:    1/2 ppd or less 06/15/21. Hsm/.   Vaping Use   Vaping Use: Never used  Substance and Sexual Activity   Alcohol use: Yes    Comment: Rum and Coke/Unsure of amount   Drug use: Never   Sexual activity: Not on file  Other Topics Concern   Not on file  Social History Narrative   Married    Two grown children    She enjoys reading, walking   Social Determinants of Health   Financial Resource Strain: Medium Risk (10/18/2021)   Overall Financial Resource Strain (CARDIA)    Difficulty of Paying Living Expenses: Somewhat hard  Food Insecurity: No Food Insecurity (11/03/2021)   Hunger Vital Sign    Worried About Running Out of Food in the Last Year: Never true    Ran Out of Food in the Last Year: Never true  Transportation Needs: No Transportation Needs (11/03/2021)   PRAPARE - Hydrologist (Medical): No    Lack of Transportation (Non-Medical): No  Physical Activity: Unknown (10/18/2021)   Exercise Vital Sign    Days of Exercise per Week: Patient refused    Minutes of Exercise per Session: Not on file  Stress: No Stress Concern Present (10/18/2021)   [place]    Feeling of Stress : Only a little  Social Connections: Unknown (10/18/2021)   Social Connection and Isolation Panel [NHANES]    Frequency of Communication with Friends and Family: Three times a week    Frequency of Social Gatherings with Friends and Family: Patient refused    Attends Religious Services: Patient refused    Active  Member of Clubs or Organizations: No    Attends Music therapist: Not on file    Marital Status: Widowed  Intimate Partner Violence: Not At Risk (11/03/2021)   Humiliation, Afraid, Rape, and Kick questionnaire    Fear of Current or Ex-Partner: No    Emotionally Abused: No    Physically Abused: No    Sexually Abused: No    FAMILY HISTORY: Family History  Problem Relation Age of Onset   Arthritis Mother    Diabetes Mother    Heart disease Mother    Hyperlipidemia Mother    Hypertension Mother    Miscarriages / [place] Mother    Hearing loss Father    Liver cancer Father    Lung cancer Father    Hyperlipidemia Father    Diabetes Brother    Lung disease Maternal Grandfather        Black Lung   Diabetes Paternal Grandmother    Cancer Paternal Grandfather  Diabetes Paternal Grandfather     Review of Systems  Constitutional:  Negative for appetite change, chills, fatigue, fever and unexpected weight change.  HENT:   Negative for hearing loss, lump/mass and trouble swallowing.   Eyes:  Negative for eye problems and icterus.  Respiratory:  Negative for chest tightness, cough and shortness of breath.   Cardiovascular:  Negative for chest pain, leg swelling and palpitations.  Gastrointestinal:  Positive for abdominal pain and nausea. Negative for abdominal distention, constipation, diarrhea and vomiting.  Endocrine: Negative for hot flashes.  Genitourinary:  Negative for difficulty urinating.   Musculoskeletal:  Positive for flank pain. Negative for arthralgias.  Skin:  Negative for itching and rash.  Neurological:  Negative for dizziness, extremity weakness, headaches and numbness.  Hematological:  Negative for adenopathy. Does not bruise/bleed easily.  Psychiatric/Behavioral:  Negative for depression. The patient is not nervous/anxious.       PHYSICAL EXAMINATION  ECOG PERFORMANCE STATUS: 1 - Symptomatic but completely ambulatory  Vitals:   11/02/21 1041   BP: 133/72  Pulse: 72  Resp: 16  Temp: 97.8 F (36.6 C)  SpO2: 100%    Physical Exam Constitutional:      General: She is not in acute distress.    Appearance: Normal appearance. She is not toxic-appearing.  HENT:     Head: Normocephalic and atraumatic.  Eyes:     General: No scleral icterus. Cardiovascular:     Rate and Rhythm: Normal rate and regular rhythm.     Pulses: Normal pulses.     Heart sounds: Normal heart sounds.  Pulmonary:     Effort: Pulmonary effort is normal.     Breath sounds: Normal breath sounds.  Abdominal:     General: Abdomen is flat. Bowel sounds are normal. There is no distension.     Palpations: Abdomen is soft.     Tenderness: There is no abdominal tenderness.  Musculoskeletal:        General: No swelling.     Cervical back: Neck supple.  Lymphadenopathy:     Cervical: No cervical adenopathy.  Skin:    General: Skin is warm and dry.     Findings: No rash.  Neurological:     General: No focal deficit present.     Mental Status: She is alert.  Psychiatric:        Mood and Affect: Mood normal.        Behavior: Behavior normal.     LABORATORY DATA:  CBC    Component Value Date/Time   WBC 6.6 11/05/2021 0444   RBC 2.73 (L) 11/05/2021 0444   HGB 8.6 (L) 11/05/2021 0444   HGB 10.5 (L) 11/02/2021 1030   HCT 26.2 (L) 11/05/2021 0444   PLT 214 11/05/2021 0444   PLT 271 11/02/2021 1030   MCV 96.0 11/05/2021 0444   MCH 31.5 11/05/2021 0444   MCHC 32.8 11/05/2021 0444   RDW 18.4 (H) 11/05/2021 0444   LYMPHSABS 1.9 11/02/2021 1030   MONOABS 1.0 11/02/2021 1030   EOSABS 0.3 11/02/2021 1030   BASOSABS 0.1 11/02/2021 1030    CMP     Component Value Date/Time   NA 141 11/05/2021 0444   K 3.1 (L) 11/05/2021 0444   CL 110 11/05/2021 0444   CO2 26 11/05/2021 0444   GLUCOSE 110 (H) 11/05/2021 0444   BUN 8 11/05/2021 0444   CREATININE 0.60 11/05/2021 0444   CREATININE 0.68 11/02/2021 1030   CALCIUM 8.7 (L) 11/05/2021 0444   PROT 6.2  (  L) 11/05/2021 0444   ALBUMIN 2.6 (L) 11/05/2021 0444   AST 59 (H) 11/05/2021 0444   AST 53 (H) 11/02/2021 1030   ALT 54 (H) 11/05/2021 0444   ALT 65 (H) 11/02/2021 1030   ALKPHOS 488 (H) 11/05/2021 0444   BILITOT 1.9 (H) 11/05/2021 0444   BILITOT 1.4 (H) 11/02/2021 1030   GFRNONAA >60 11/05/2021 0444   GFRNONAA >60 11/02/2021 1030   GFRAA >60 09/23/2019 1031       ASSESSMENT and THERAPY PLAN:   Adenocarcinoma of right lung, stage 4 ([facility]) Sue Chase is here today for follow-up and evaluation prior to receiving Alimta and Keytruda.  She has started to experience worsening in her GI symptoms from her choledocholithiasis.  Her initial appointment was scheduled for September 26 with GI.  I reached out to Dr. Candis Schatz to see if they can expedite her appointment.    We will hold her treatment today so that she can deal with this acute issue with ERCP and cholecystectomy if needed.  She will return in 3 weeks for labs, f/u, and to potentially resume her treatment.    The above was discussed with Dr. Julien Nordmann in detail who is in agreement.    All questions were answered. The patient knows to call the clinic with any problems, questions or concerns. We can certainly see the patient much sooner if necessary.  Total encounter time:30 minutes*in face-to-face visit time, chart review, lab review, care coordination, order entry, and documentation of the encounter time.    Wilber Bihari, NP 11/05/21 8:47 PM Medical Oncology and Hematology [facility] [place], [place] [address] Tel. [phone]    Fax. [phone]  *Total Encounter Time as defined by the Centers for Medicare and Medicaid Services includes, in addition to the face-to-face time of a patient visit (documented in the note above) non-face-to-face time: obtaining and reviewing outside history, ordering and reviewing medications, tests or procedures, care coordination (communications with other health  care professionals or caregivers) and documentation in the medical record.

## 2021-11-03 ENCOUNTER — Other Ambulatory Visit: Payer: Self-pay

## 2021-11-03 ENCOUNTER — Inpatient Hospital Stay (HOSPITAL_COMMUNITY)
Admission: EM | Admit: 2021-11-03 | Discharge: 2021-11-06 | DRG: 445 | Disposition: A | Discharge status: Home / Self Care | Payer: Medicare Other | Attending: Family Medicine | Admitting: Family Medicine

## 2021-11-03 ENCOUNTER — Encounter: Payer: Self-pay | Admitting: Internal Medicine

## 2021-11-03 ENCOUNTER — Encounter (HOSPITAL_COMMUNITY): Payer: Self-pay | Admitting: Pharmacy Technician

## 2021-11-03 ENCOUNTER — Emergency Department (HOSPITAL_COMMUNITY): Payer: Medicare Other

## 2021-11-03 DIAGNOSIS — D630 Anemia in neoplastic disease: Secondary | ICD-10-CM | POA: Diagnosis present

## 2021-11-03 DIAGNOSIS — C3491 Malignant neoplasm of unspecified part of right bronchus or lung: Secondary | ICD-10-CM | POA: Diagnosis present

## 2021-11-03 DIAGNOSIS — K219 Gastro-esophageal reflux disease without esophagitis: Secondary | ICD-10-CM | POA: Diagnosis present

## 2021-11-03 DIAGNOSIS — R7989 Other specified abnormal findings of blood chemistry: Secondary | ICD-10-CM

## 2021-11-03 DIAGNOSIS — K9171 Accidental puncture and laceration of a digestive system organ or structure during a digestive system procedure: Secondary | ICD-10-CM | POA: Diagnosis not present

## 2021-11-03 DIAGNOSIS — J439 Emphysema, unspecified: Secondary | ICD-10-CM | POA: Diagnosis present

## 2021-11-03 DIAGNOSIS — E876 Hypokalemia: Secondary | ICD-10-CM

## 2021-11-03 DIAGNOSIS — Z8261 Family history of arthritis: Secondary | ICD-10-CM | POA: Diagnosis not present

## 2021-11-03 DIAGNOSIS — Z86711 Personal history of pulmonary embolism: Secondary | ICD-10-CM | POA: Diagnosis not present

## 2021-11-03 DIAGNOSIS — E785 Hyperlipidemia, unspecified: Secondary | ICD-10-CM | POA: Diagnosis present

## 2021-11-03 DIAGNOSIS — R1011 Right upper quadrant pain: Secondary | ICD-10-CM

## 2021-11-03 DIAGNOSIS — Z85118 Personal history of other malignant neoplasm of bronchus and lung: Secondary | ICD-10-CM | POA: Diagnosis not present

## 2021-11-03 DIAGNOSIS — C799 Secondary malignant neoplasm of unspecified site: Secondary | ICD-10-CM | POA: Diagnosis present

## 2021-11-03 DIAGNOSIS — Z86718 Personal history of other venous thrombosis and embolism: Secondary | ICD-10-CM | POA: Diagnosis not present

## 2021-11-03 DIAGNOSIS — F17210 Nicotine dependence, cigarettes, uncomplicated: Secondary | ICD-10-CM | POA: Diagnosis present

## 2021-11-03 DIAGNOSIS — Z885 Allergy status to narcotic agent status: Secondary | ICD-10-CM

## 2021-11-03 DIAGNOSIS — D649 Anemia, unspecified: Secondary | ICD-10-CM

## 2021-11-03 DIAGNOSIS — K8050 Calculus of bile duct without cholangitis or cholecystitis without obstruction: Secondary | ICD-10-CM | POA: Diagnosis not present

## 2021-11-03 DIAGNOSIS — Z8719 Personal history of other diseases of the digestive system: Secondary | ICD-10-CM | POA: Diagnosis not present

## 2021-11-03 DIAGNOSIS — Z716 Tobacco abuse counseling: Secondary | ICD-10-CM | POA: Diagnosis not present

## 2021-11-03 DIAGNOSIS — Z833 Family history of diabetes mellitus: Secondary | ICD-10-CM

## 2021-11-03 DIAGNOSIS — K8071 Calculus of gallbladder and bile duct without cholecystitis with obstruction: Principal | ICD-10-CM | POA: Diagnosis present

## 2021-11-03 DIAGNOSIS — Z79899 Other long term (current) drug therapy: Secondary | ICD-10-CM | POA: Diagnosis not present

## 2021-11-03 DIAGNOSIS — Z7901 Long term (current) use of anticoagulants: Secondary | ICD-10-CM

## 2021-11-03 DIAGNOSIS — Y838 Other surgical procedures as the cause of abnormal reaction of the patient, or of later complication, without mention of misadventure at the time of the procedure: Secondary | ICD-10-CM | POA: Diagnosis not present

## 2021-11-03 DIAGNOSIS — Z83438 Family history of other disorder of lipoprotein metabolism and other lipidemia: Secondary | ICD-10-CM | POA: Diagnosis not present

## 2021-11-03 DIAGNOSIS — K311 Adult hypertrophic pyloric stenosis: Secondary | ICD-10-CM | POA: Diagnosis present

## 2021-11-03 DIAGNOSIS — K8051 Calculus of bile duct without cholangitis or cholecystitis with obstruction: Secondary | ICD-10-CM | POA: Diagnosis not present

## 2021-11-03 DIAGNOSIS — Z8249 Family history of ischemic heart disease and other diseases of the circulatory system: Secondary | ICD-10-CM | POA: Diagnosis not present

## 2021-11-03 DIAGNOSIS — K831 Obstruction of bile duct: Secondary | ICD-10-CM | POA: Diagnosis present

## 2021-11-03 DIAGNOSIS — R109 Unspecified abdominal pain: Principal | ICD-10-CM

## 2021-11-03 DIAGNOSIS — I82409 Acute embolism and thrombosis of unspecified deep veins of unspecified lower extremity: Secondary | ICD-10-CM

## 2021-11-03 DIAGNOSIS — Z720 Tobacco use: Secondary | ICD-10-CM | POA: Diagnosis present

## 2021-11-03 DIAGNOSIS — Z800 Family history of malignant neoplasm of digestive organs: Secondary | ICD-10-CM

## 2021-11-03 DIAGNOSIS — K5710 Diverticulosis of small intestine without perforation or abscess without bleeding: Secondary | ICD-10-CM | POA: Diagnosis present

## 2021-11-03 DIAGNOSIS — K838 Other specified diseases of biliary tract: Secondary | ICD-10-CM | POA: Diagnosis not present

## 2021-11-03 DIAGNOSIS — Z801 Family history of malignant neoplasm of trachea, bronchus and lung: Secondary | ICD-10-CM

## 2021-11-03 LAB — COMPREHENSIVE METABOLIC PANEL
ALT: 58 U/L — ABNORMAL HIGH (ref 0–44)
AST: 63 U/L — ABNORMAL HIGH (ref 15–41)
Albumin: 2.9 g/dL — ABNORMAL LOW (ref 3.5–5.0)
Alkaline Phosphatase: 489 U/L — ABNORMAL HIGH (ref 38–126)
Anion gap: 7 (ref 5–15)
BUN: 13 mg/dL (ref 8–23)
CO2: 28 mmol/L (ref 22–32)
Calcium: 9.1 mg/dL (ref 8.9–10.3)
Chloride: 104 mmol/L (ref 98–111)
Creatinine, Ser: 0.76 mg/dL (ref 0.44–1.00)
GFR, Estimated: >60 mL/min (ref >60)
Glucose, Bld: 109 mg/dL — ABNORMAL HIGH (ref 70–99)
Potassium: 2.8 mmol/L — ABNORMAL LOW (ref 3.5–5.1)
Sodium: 139 mmol/L (ref 135–145)
Total Bilirubin: 2.1 mg/dL — ABNORMAL HIGH (ref 0.3–1.2)
Total Protein: 6.4 g/dL — ABNORMAL LOW (ref 6.5–8.1)

## 2021-11-03 LAB — APTT: aPTT: 30 seconds (ref 24–36)

## 2021-11-03 LAB — URINALYSIS, ROUTINE W REFLEX MICROSCOPIC
Bilirubin Urine: NEGATIVE
Glucose, UA: NEGATIVE mg/dL
Hgb urine dipstick: NEGATIVE
Ketones, ur: NEGATIVE mg/dL
Leukocytes,Ua: NEGATIVE
Nitrite: NEGATIVE
Protein, ur: NEGATIVE mg/dL
Specific Gravity, Urine: 1.017 (ref 1.005–1.030)
pH: 5 (ref 5.0–8.0)

## 2021-11-03 LAB — CBC
HCT: 28.8 % — ABNORMAL LOW (ref 36.0–46.0)
Hemoglobin: 9.6 g/dL — ABNORMAL LOW (ref 12.0–15.0)
MCH: 31.7 pg (ref 26.0–34.0)
MCHC: 33.3 g/dL (ref 30.0–36.0)
MCV: 95 fL (ref 80.0–100.0)
Platelets: 263 10*3/uL (ref 150–400)
RBC: 3.03 MIL/uL — ABNORMAL LOW (ref 3.87–5.11)
RDW: 17.5 % — ABNORMAL HIGH (ref 11.5–15.5)
WBC: 8.8 10*3/uL (ref 4.0–10.5)
nRBC: 0 % (ref 0.0–0.2)

## 2021-11-03 LAB — LIPASE, BLOOD: Lipase: 88 U/L — ABNORMAL HIGH (ref 11–51)

## 2021-11-03 LAB — HEPARIN LEVEL (UNFRACTIONATED): Heparin Unfractionated: 0.33 IU/mL (ref 0.30–0.70)

## 2021-11-03 LAB — MAGNESIUM: Magnesium: 1.6 mg/dL — ABNORMAL LOW (ref 1.7–2.4)

## 2021-11-03 MED ORDER — POTASSIUM CHLORIDE CRYS ER 20 MEQ PO TBCR
40.0000 meq | EXTENDED_RELEASE_TABLET | Freq: Two times a day (BID) | ORAL | Status: DC
  Administered 2021-11-03: 40 meq via ORAL
  Filled 2021-11-03 (×2): qty 2

## 2021-11-03 MED ORDER — ACETAMINOPHEN 325 MG PO TABS
650.0000 mg | ORAL_TABLET | Freq: Four times a day (QID) | ORAL | Status: DC | PRN
  Administered 2021-11-05: 650 mg via ORAL
  Filled 2021-11-03: qty 2

## 2021-11-03 MED ORDER — CIPROFLOXACIN IN D5W 400 MG/200ML IV SOLN
400.0000 mg | Freq: Two times a day (BID) | INTRAVENOUS | Status: DC
  Administered 2021-11-03 – 2021-11-06 (×6): 400 mg via INTRAVENOUS
  Filled 2021-11-03 (×6): qty 200

## 2021-11-03 MED ORDER — SODIUM CHLORIDE 0.9 % IV SOLN: INTRAVENOUS | Status: DC

## 2021-11-03 MED ORDER — PROCHLORPERAZINE EDISYLATE 10 MG/2ML IJ SOLN
10.0000 mg | Freq: Four times a day (QID) | INTRAMUSCULAR | Status: DC | PRN
  Administered 2021-11-03 – 2021-11-05 (×2): 10 mg via INTRAVENOUS
  Filled 2021-11-03 (×3): qty 2

## 2021-11-03 MED ORDER — HEPARIN (PORCINE) 25000 UT/250ML-% IV SOLN
850.0000 [IU]/h | INTRAVENOUS | Status: AC
  Administered 2021-11-03: 700 [IU]/h via INTRAVENOUS
  Administered 2021-11-05: 850 [IU]/h via INTRAVENOUS
  Filled 2021-11-03 (×2): qty 250

## 2021-11-03 MED ORDER — OXYCODONE HCL 5 MG PO TABS: 5.0000 mg | ORAL_TABLET | ORAL | Status: DC | PRN

## 2021-11-03 MED ORDER — ACETAMINOPHEN 650 MG RE SUPP: 650.0000 mg | Freq: Four times a day (QID) | RECTAL | Status: DC | PRN

## 2021-11-03 NOTE — Plan of Care (Signed)
  Problem: Education: Goal: Knowledge of General Education information will improve Description: Including pain rating scale, medication(s)/side effects and non-pharmacologic comfort measures 11/03/2021 1928 by Jerene Pitch, RN Outcome: Progressing 11/03/2021 1927 by Jerene Pitch, RN Outcome: Progressing   Problem: Health Behavior/Discharge Planning: Goal: Ability to manage health-related needs will improve 11/03/2021 1928 by Jerene Pitch, RN Outcome: Progressing 11/03/2021 1927 by Jerene Pitch, RN Outcome: Progressing

## 2021-11-03 NOTE — ED Notes (Signed)
Pt Has Port.

## 2021-11-03 NOTE — Plan of Care (Signed)
  Problem: Education: Goal: Knowledge of General Education information will improve Description Including pain rating scale, medication(s)/side effects and non-pharmacologic comfort measures Outcome: Progressing   Problem: Health Behavior/Discharge Planning: Goal: Ability to manage health-related needs will improve Outcome: Progressing   

## 2021-11-03 NOTE — H&P (Addendum)
History and Physical    Patient: Sue Chase MVH:[id number] DOB: [birth year] DOA: 11/03/2021 DOS: the patient was seen and examined on 11/03/2021 PCP: [name], NP  Patient coming from: Home  Chief Complaint:  Chief Complaint  Patient presents with   Abdominal Pain   HPI: Sue Chase is a 66 y.o. female with medical history significant of DVT/PE on eliquis, GERD, adenocarcinoma of the lung. Presenting with N/V, abdominal pain. Her current flare started about 5 days ago. She noticed RUQ, sharp pain shortly after eating. It was accompanied by some right side chest pain. She had a few episodes of N/V. She tried Barrister's clerk down. Vomiting seemed to make things better. She also tried APAP. When she presented to her chemo infusion session yesterday, it was noted that her LFTs were elevated, so the session was canceled. It was recommended that she come to the ED for w/u. She denies any other aggravating or alleviating factors.  Review of Systems: As mentioned in the history of present illness. All other systems reviewed and are negative. Past Medical History:  Diagnosis Date   Colon polyps    Complication of anesthesia    tolerated propofol but other meds made her emotional / cry/ difficulty breathing/ panic   Hyperlipidemia    Rheumatic fever    Seasonal allergies    UTI (urinary tract infection)    Past Surgical History:  Procedure Laterality Date   BRONCHIAL BIOPSY  06/08/2021   Procedure: BRONCHIAL BIOPSIES;  Surgeon: Garner Nash, DO;  Location: [facility];  Service: Pulmonary;;   BRONCHIAL BRUSHINGS  06/08/2021   Procedure: BRONCHIAL BRUSHINGS;  Surgeon: Garner Nash, DO;  Location: [facility];  Service: Pulmonary;;   BRONCHIAL NEEDLE ASPIRATION BIOPSY  06/08/2021   Procedure: BRONCHIAL NEEDLE ASPIRATION BIOPSIES;  Surgeon: Garner Nash, DO;  Location: [facility];  Service: Pulmonary;;   COLONOSCOPY  2018   IR IMAGING GUIDED PORT INSERTION   08/12/2021   SALIVARY GLAND SURGERY     LATE 80S   TUBAL LIGATION  1986   VIDEO BRONCHOSCOPY WITH RADIAL ENDOBRONCHIAL ULTRASOUND  06/08/2021   Procedure: VIDEO BRONCHOSCOPY WITH RADIAL ENDOBRONCHIAL ULTRASOUND;  Surgeon: Garner Nash, DO;  Location: [facility];  Service: Pulmonary;;   Social History:  reports that she has been smoking cigarettes. She has a 40.00 pack-year smoking history. She has never been exposed to tobacco smoke. She has never used smokeless tobacco. She reports current alcohol use. She reports that she does not use drugs.  Allergies  Allergen Reactions   Codeine Itching    Family History  Problem Relation Age of Onset   Arthritis Mother    Diabetes Mother    Heart disease Mother    Hyperlipidemia Mother    Hypertension Mother    Miscarriages / [place] Mother    Hearing loss Father    Liver cancer Father    Lung cancer Father    Hyperlipidemia Father    Diabetes Brother    Lung disease Maternal Grandfather        Black Lung   Diabetes Paternal Grandmother    Cancer Paternal Grandfather    Diabetes Paternal Grandfather     Prior to Admission medications   Medication Sig Start Date End Date Taking? Authorizing Provider  albuterol (VENTOLIN HFA) 108 (90 Base) MCG/ACT inhaler Inhale 2 puffs into the lungs every 6 (six) hours as needed for wheezing or shortness of breath. 02/21/21   Pokhrel, Corrie Mckusick, MD  apixaban Arne [place])  5 MG TABS tablet Take 1 tablet (5 mg total) by mouth 2 (two) times daily. 04/30/21 07/29/21  Nafziger, Tommi Rumps, NP  folic acid (FOLVITE) 1 MG tablet Take 1 tablet (1 mg total) by mouth daily. 08/03/21   Curt Bears, MD  lidocaine-prilocaine (EMLA) cream Apply to the Port-A-Cath site 30 minutes before chemotherapy 08/03/21   Curt Bears, MD  magnesium oxide (MAG-OX) 400 (240 Mg) MG tablet Take 400 mg by mouth daily.    [provider]  mirtazapine (REMERON) 15 MG tablet Take 2 tablets (30 mg total) by mouth at bedtime. 07/06/21    Curt Bears, MD  omeprazole (PRILOSEC) 20 MG capsule Take 1 capsule (20 mg total) by mouth daily. 11/20/17   Nafziger, Tommi Rumps, NP  potassium chloride SA (KLOR-CON M) 20 MEQ tablet Take 1 tablet (20 mEq total) by mouth daily. 08/03/21   Curt Bears, MD  prochlorperazine (COMPAZINE) 10 MG tablet Take 1 tablet (10 mg total) by mouth every 6 (six) hours as needed for nausea or vomiting. 08/03/21   Curt Bears, MD    Physical Exam: Vitals:   11/03/21 1021 11/03/21 1250 11/03/21 1330  BP: 118/82 (!) 148/80 127/75  Pulse: 82 (!) 104 71  Resp: 16 18 18   Temp: 97.9 F (36.6 C)    TempSrc: Oral    SpO2: 99% 99% 100%   General: 65 y.o. female resting in bed in NAD Eyes: PERRL, normal sclera ENMT: Nares patent w/o discharge, orophaynx clear, dentition normal, ears w/o discharge/lesions/ulcers Neck: Supple, trachea midline Cardiovascular: RRR, +S1, S2, no m/g/r, equal pulses throughout Respiratory: CTABL, no w/r/r, normal WOB GI: BS+, ND, TTP RUQ, no masses noted, no organomegaly noted MSK: No e/c/c Neuro: A&O x 3, no focal deficits Psyc: Appropriate interaction and affect, calm/cooperative  Data Reviewed:   Results for orders placed or performed during the hospital encounter of 11/03/21 (from the past 24 hour(s))  Urinalysis, Routine w reflex microscopic Urine, Clean Catch     Status: Abnormal   Collection Time: 11/03/21 12:46 PM  Result Value Ref Range   Color, Urine AMBER (A) YELLOW   APPearance CLEAR CLEAR   Specific Gravity, Urine 1.017 1.005 - 1.030   pH 5.0 5.0 - 8.0   Glucose, UA NEGATIVE NEGATIVE mg/dL   Hgb urine dipstick NEGATIVE NEGATIVE   Bilirubin Urine NEGATIVE NEGATIVE   Ketones, ur NEGATIVE NEGATIVE mg/dL   Protein, ur NEGATIVE NEGATIVE mg/dL   Nitrite NEGATIVE NEGATIVE   Leukocytes,Ua NEGATIVE NEGATIVE  CBC     Status: Abnormal   Collection Time: 11/03/21  1:51 PM  Result Value Ref Range   WBC 8.8 4.0 - 10.5 K/uL   RBC 3.03 (L) 3.87 - 5.11 MIL/uL    Hemoglobin 9.6 (L) 12.0 - 15.0 g/dL   HCT 28.8 (L) 36.0 - 46.0 %   MCV 95.0 80.0 - 100.0 fL   MCH 31.7 26.0 - 34.0 pg   MCHC 33.3 30.0 - 36.0 g/dL   RDW 17.5 (H) 11.5 - 15.5 %   Platelets 263 150 - 400 K/uL   nRBC 0.0 0.0 - 0.2 %   Lab Results  Component Value Date   NA 138 11/02/2021   K 3.1 (L) 11/02/2021   CO2 29 11/02/2021   GLUCOSE 115 (H) 11/02/2021   BUN 12 11/02/2021   CREATININE 0.68 11/02/2021   CALCIUM 9.4 11/02/2021   GFRNONAA >60 11/02/2021   Lab Results  Component Value Date   ALT 65 (H) 11/02/2021   AST 53 (H)  11/02/2021   ALKPHOS 562 (H) 11/02/2021   BILITOT 1.4 (H) 11/02/2021   RUQ [place] Mild intrahepatic biliary ductal prominence. Dilated common bile duct, approximally 1 cm. Small amount of sludge in the gallbladder. Choledocholithiasis was demonstrated at previous CT within the distal duct. The distal duct is not well seen on today's ultrasound.  Assessment and Plan: Biliary obstruction Choledocholithiasis     - admit to inpt, med-surg     - [facility]I onboard, appreciate assistance     - started on cipro per GI     - ERCP on 9/15; can have diet now     - anti-emetics, fluids, pain control Metastatic lung adenocarcinoma     - follows w/ Dr. Julien Nordmann; added to care team     - continue follow up  DVT/PE on eliquis     - holding home regimen for now     - start heparin gtt  Tobacco abuse     - she is working on quitting  GERD     - PPI  Normocytic anemia     - at baseline, no evidence of bleed  Hypokalemia     - replace K+; check Mg2+  Advance Care Planning:  Code Status: FULL  Consults: LBGI  Family Communication: w/ family at bedside  Severity of Illness: The appropriate patient status for this patient is INPATIENT. Inpatient status is judged to be reasonable and necessary in order to provide the required intensity of service to ensure the patient's safety. The patient's presenting symptoms, physical exam findings, and initial radiographic  and laboratory data in the context of their chronic comorbidities is felt to place them at high risk for further clinical deterioration. Furthermore, it is not anticipated that the patient will be medically stable for discharge from the hospital within 2 midnights of admission.   * I certify that at the point of admission it is my clinical judgment that the patient will require inpatient hospital care spanning beyond 2 midnights from the point of admission due to high intensity of service, high risk for further deterioration and high frequency of surveillance required.*  Time spent in coordination of this H&P: 47 minutes   Author: Jonnie Finner, DO 11/03/2021 2:04 PM  For on call review [email or web address].

## 2021-11-03 NOTE — ED Provider Notes (Signed)
I saw and evaluated the patient, reviewed the resident's note and I agree with the findings and plan.      66 year old female presents with concern for possible choledocholithiasis.  Had a CT scan done about a month ago which showed stones in her gallbladder duct.  Had some symptom several days ago which is since resolved consisting of abdominal discomfort.  Currently, she denies any fever or chills.  On exam, she has no abdominal discomfort.  Plan will be for patient to have blood work as well as ultrasound and speak with her gastroenterologist   Lacretia Leigh, MD 11/03/21 1129

## 2021-11-03 NOTE — Consult Note (Addendum)
Consultation  Referring Provider:  Dr. Zenia Resides    Primary Care Physician:  [name], NP Primary Gastroenterologist:  Althia Forts (appt scheduled with Dr. Candis Schatz later this month)       Reason for Consultation:   Choledocholithiasis         HPI:   Sue Chase is a 66 y.o. female with a past medical history of recently diagnosed metastatic lung adenocarcinoma on chemotherapy and immunotherapy as well as history of DVT/PE on chronic anticoagulation with Eliquis, who presented to the ED today with a complaint of abdominal pain and increasing LFTs.  Imaging with choledocholithiasis 4 weeks ago.    Today, patient explains that she presented to the cancer center yesterday and her labs were notable for persistently elevated transaminitis and as a result her chemotherapy was canceled.  At time of that visit she described intermittent episodes of sharp right upper quadrant pain over the past year most recently the week before with 2 episodes that seemed to coincide with food ingestion.  Also describes some acholic stools during that time.  Apparently this has been happening off-and-on maybe once a month or so where she would have right upper quadrant pain and have some nausea and vomiting and then the pain would go away within 24 hours.  This is happened multiple times over the past year but seems to be increasing in frequency lately.  Tells me she has been eating a regular diet.  Does not feel like this causes any increase in pain or nausea.  Last dose of Eliquis was yesterday morning 11/02/2021.    Patient is sad because she was due to go to the beach tomorrow with her family.    Denies fever, chills, weight loss, nausea, vomiting, heartburn or reflux.  Recent imaging: 10/03/2021 CT of the abdomen pelvis with similar appearance of posterior right upper lobe mixed attenuation lung mass, innumerable bilateral groundglass nodules also noted, no thoracic adenopathy, choledocholithiasis with  development of biliary duct dilation, aortic atherosclerosis, CAD and emphysema as well as intramural hematoma within the proximal transverse aorta, marked stenosis of the left femoral artery.  ED course: Right upper quadrant ultrasound with mild intrahepatic biliary ductal prominence, dilated CBD approximately 1 cm and a small amount of sludge in the gallbladder, recent LFTs 09/21/2021 and alk phos 207--> 8/29 216--> 630--> 562 over the past 24 hours, ALT initially normal now 111 8 days ago--> 65 at admission, AST previously normal, 98 days ago--> 53 at admission and total bili now elevated at 1.4  GI history: 11/02/2021 telephone note by Dr. Candis Schatz: At that time discussed he had been consulted by the patient's oncologist, had an outpatient appointment scheduled later this month, recently diagnosed with metastatic lung cancer and incidentally found to have choledocholithiasis on her staging CT a month ago, reported an episode of severe abdominal pain with nausea and vomiting a few days prior and describes having these intermittently over the past year although more frequent recently, also described light-colored/tan stools for the past few days, discussed mixed pattern liver enzyme elevation, is recommended that she come to the ER for reevaluation of choledocholithiasis and possible admission and expedited ERCP with possible cholecystectomy, noted she was on Eliquis for history of DVT  Past Medical History:  Diagnosis Date   Colon polyps    Complication of anesthesia    tolerated propofol but other meds made her emotional / cry/ difficulty breathing/ panic   Hyperlipidemia    Rheumatic fever  Seasonal allergies    UTI (urinary tract infection)     Past Surgical History:  Procedure Laterality Date   BRONCHIAL BIOPSY  06/08/2021   Procedure: BRONCHIAL BIOPSIES;  Surgeon: Garner Nash, DO;  Location: [facility];  Service: Pulmonary;;   BRONCHIAL BRUSHINGS  06/08/2021   Procedure:  BRONCHIAL BRUSHINGS;  Surgeon: Garner Nash, DO;  Location: [facility];  Service: Pulmonary;;   BRONCHIAL NEEDLE ASPIRATION BIOPSY  06/08/2021   Procedure: BRONCHIAL NEEDLE ASPIRATION BIOPSIES;  Surgeon: Garner Nash, DO;  Location: [facility];  Service: Pulmonary;;   COLONOSCOPY  2018   IR IMAGING GUIDED PORT INSERTION  08/12/2021   SALIVARY GLAND SURGERY     LATE 80S   TUBAL LIGATION  1986   VIDEO BRONCHOSCOPY WITH RADIAL ENDOBRONCHIAL ULTRASOUND  06/08/2021   Procedure: VIDEO BRONCHOSCOPY WITH RADIAL ENDOBRONCHIAL ULTRASOUND;  Surgeon: Garner Nash, DO;  Location: [facility];  Service: Pulmonary;;    Family History  Problem Relation Age of Onset   Arthritis Mother    Diabetes Mother    Heart disease Mother    Hyperlipidemia Mother    Hypertension Mother    Miscarriages / [place] Mother    Hearing loss Father    Liver cancer Father    Lung cancer Father    Hyperlipidemia Father    Diabetes Brother    Lung disease Maternal Grandfather        Black Lung   Diabetes Paternal Grandmother    Cancer Paternal Grandfather    Diabetes Paternal Grandfather     Social History   Tobacco Use   Smoking status: Some Days    Packs/day: 1.00    Years: 40.00    Total pack years: 40.00    Types: Cigarettes    Passive exposure: Never   Smokeless tobacco: Never   Tobacco comments:    1/2 ppd or less 06/15/21. Hsm/.   Vaping Use   Vaping Use: Never used  Substance Use Topics   Alcohol use: Yes    Comment: Rum and Coke/Unsure of amount   Drug use: Never    Prior to Admission medications   Medication Sig Start Date End Date Taking? Authorizing Provider  albuterol (VENTOLIN HFA) 108 (90 Base) MCG/ACT inhaler Inhale 2 puffs into the lungs every 6 (six) hours as needed for wheezing or shortness of breath. 02/21/21   Pokhrel, Corrie Mckusick, MD  apixaban (ELIQUIS) 5 MG TABS tablet Take 1 tablet (5 mg total) by mouth 2 (two) times daily. 04/30/21 07/29/21  Nafziger, Tommi Rumps, NP  folic  acid (FOLVITE) 1 MG tablet Take 1 tablet (1 mg total) by mouth daily. 08/03/21   Curt Bears, MD  lidocaine-prilocaine (EMLA) cream Apply to the Port-A-Cath site 30 minutes before chemotherapy 08/03/21   Curt Bears, MD  magnesium oxide (MAG-OX) 400 (240 Mg) MG tablet Take 400 mg by mouth daily.    [provider]  mirtazapine (REMERON) 15 MG tablet Take 2 tablets (30 mg total) by mouth at bedtime. 07/06/21   Curt Bears, MD  omeprazole (PRILOSEC) 20 MG capsule Take 1 capsule (20 mg total) by mouth daily. 11/20/17   Nafziger, Tommi Rumps, NP  potassium chloride SA (KLOR-CON M) 20 MEQ tablet Take 1 tablet (20 mEq total) by mouth daily. 08/03/21   Curt Bears, MD  prochlorperazine (COMPAZINE) 10 MG tablet Take 1 tablet (10 mg total) by mouth every 6 (six) hours as needed for nausea or vomiting. 08/03/21   Curt Bears, MD    No current  facility-administered medications for this encounter.   Current Outpatient Medications  Medication Sig Dispense Refill   albuterol (VENTOLIN HFA) 108 (90 Base) MCG/ACT inhaler Inhale 2 puffs into the lungs every 6 (six) hours as needed for wheezing or shortness of breath. 8 g 1   apixaban (ELIQUIS) 5 MG TABS tablet Take 1 tablet (5 mg total) by mouth 2 (two) times daily. 110 tablet 1   folic acid (FOLVITE) 1 MG tablet Take 1 tablet (1 mg total) by mouth daily. 30 tablet 4   lidocaine-prilocaine (EMLA) cream Apply to the Port-A-Cath site 30 minutes before chemotherapy 30 g 0   magnesium oxide (MAG-OX) 400 (240 Mg) MG tablet Take 400 mg by mouth daily.     mirtazapine (REMERON) 15 MG tablet Take 2 tablets (30 mg total) by mouth at bedtime. 30 tablet 2   omeprazole (PRILOSEC) 20 MG capsule Take 1 capsule (20 mg total) by mouth daily. 30 capsule 3   potassium chloride SA (KLOR-CON M) 20 MEQ tablet Take 1 tablet (20 mEq total) by mouth daily. 10 tablet 0   prochlorperazine (COMPAZINE) 10 MG tablet Take 1 tablet (10 mg total) by mouth every 6 (six)  hours as needed for nausea or vomiting. 30 tablet 0    Allergies as of 11/03/2021 - Review Complete 11/03/2021  Allergen Reaction Noted   Codeine Itching 06/29/2017     Review of Systems:    Constitutional: No weight loss, fever or chills Skin: No rash  Cardiovascular: No chest pain  Respiratory: No SOB  Gastrointestinal: See HPI and otherwise negative Genitourinary: No dysuria  Neurological: No headache, dizziness or syncope Musculoskeletal: No new muscle or joint pain Hematologic: No bleeding  Psychiatric: No history of depression or anxiety    Physical Exam:  Vital signs in last 24 hours: Temp:  [97.9 F (36.6 C)] 97.9 F (36.6 C) (09/13 1021) Pulse Rate:  [82-104] 104 (09/13 1250) Resp:  [16-18] 18 (09/13 1250) BP: (118-148)/(80-82) 148/80 (09/13 1250) SpO2:  [99 %] 99 % (09/13 1250)   General:   Pleasant thin appearing Caucasian female appears to be in NAD, Well developed, Well nourished, alert and cooperative Head:  Normocephalic and atraumatic. Eyes:   PEERL, EOMI. No icterus. Conjunctiva pink. Ears:  Normal auditory acuity. Neck:  Supple Throat: Oral cavity and pharynx without inflammation, swelling or lesion.  Lungs: Respirations even and unlabored. Lungs clear to auscultation bilaterally.   No wheezes, crackles, or rhonchi.  Heart: Normal S1, S2. No MRG. Regular rate and rhythm. No peripheral edema, cyanosis or pallor.  Abdomen:  Soft, nondistended, mild RUQ ttp. No rebound or guarding. Normal bowel sounds. No appreciable masses or hepatomegaly. Rectal:  Not performed.  Msk:  Symmetrical without gross deformities. Peripheral pulses intact.  Extremities:  Without edema, no deformity or joint abnormality.  Neurologic:  Alert and  oriented x4;  grossly normal neurologically.  Skin:   Dry and intact without significant lesions or rashes. Psychiatric: Demonstrates good judgement and reason without abnormal affect or behaviors.   LAB RESULTS: Recent Labs     11/02/21 1030  WBC 7.6  HGB 10.5*  HCT 30.4*  PLT 271   BMET Recent Labs    11/02/21 1030  NA 138  K 3.1*  CL 102  CO2 29  GLUCOSE 115*  BUN 12  CREATININE 0.68  CALCIUM 9.4      Latest Ref Rng & Units 11/02/2021   10:30 AM 10/26/2021    2:23 PM 10/19/2021    2:09  PM  Hepatic Function  Total Protein 6.5 - 8.1 g/dL 7.2  7.3  7.2   Albumin 3.5 - 5.0 g/dL 3.6  3.8  4.1   AST 15 - 41 U/L 53  90  25   ALT 0 - 44 U/L 65  111  47   Alk Phosphatase 38 - 126 U/L 562  630  216   Total Bilirubin 0.3 - 1.2 mg/dL 1.4  1.0  0.8      STUDIES: US Abdomen Limited RUQ (LIVER/GB)  Result Date: 11/03/2021 CLINICAL DATA:  Alkaline phosphatase elevation EXAM: ULTRASOUND ABDOMEN LIMITED RIGHT UPPER QUADRANT COMPARISON:  CT 10/03/2021 FINDINGS: Gallbladder: Small amount of sludge in the gallbladder. No stones. No wall thickening or surrounding fluid. No Murphy sign. Common bile duct: Diameter: 9.9 mm.  No obstructing lesion identified. Liver: Mild intrahepatic ductal prominence. Mildly heterogeneous echotexture that could go along with mild steatosis. No evidence of focal mass. Portal vein is patent on color Doppler imaging with normal direction of blood flow towards the liver. Other: None. IMPRESSION: Mild intrahepatic biliary ductal prominence. Dilated common bile duct, approximally 1 cm. Small amount of sludge in the gallbladder. Choledocholithiasis was demonstrated at previous CT within the distal duct. The distal duct is not well seen on today's ultrasound. Electronically Signed   By: Nelson Chimes M.D.   On: 11/03/2021 12:41     Impression / Plan:   Impression: 1.  Choledocholithiasis: Intermittent right upper quadrant pain over the past year now with persistently elevated LFTs, associated symptoms of nausea and vomiting during episodes; most likely symptomatic choledocholithiasis 2.  Newly diagnosed lung cancer on chemotherapy and immunotherapy 3.  History of DVT/PE: On Eliquis last dose  11/03/2018 3 AM  Plan: 1.  Patient would benefit from ERCP with further evaluation of choledocholithiasis and likely cholecystectomy this admission.  Currently this is scheduled for 11/05/2021 at 1:00 PM with Dr. Henrene Pastor.  Did discuss risks, benefits, limitations and alternatives and the patient agrees to proceed. 2.  Ordered repeat daily CBC and CMP 3.  Continue supportive measures 4.  Patient can be on regular diet today and tomorrow and n.p.o. at midnight on 11/04/2021 5.  Continue to hold Eliquis for now 6.  Discussed with patient that after ERCP we would consult the surgical team and they would decide timing for cholecystectomy 7.  Started the patient on Ciprofloxacin 400 mg IV twice daily 8. If patient needs IV heparin to bridge this, it will need to be held 6 hours prior to time of scheduled procedure which is on for 1:00 pm on 11/05/2021.  Thank you for your kind consultation, we will continue to follow.  Lavone Nian Lemmon  11/03/2021, 1:30 PM  GI ATTENDING.  History, laboratories, x-rays personally reviewed.  Patient personally seen and examined in the emergency room.  Agree with comprehensive consultation as outlined above.  Case discussed with Dr. Candis Schatz.  Patient with metastatic lung cancer as well as DVT with pulmonary embolism on Eliquis is admitted to the hospital with abdominal pain and choledocholithiasis.  Noted to have choledocholithiasis on CT 1 month ago.  Has been having intermittent problems with abdominal pain and vomiting.  Liver tests are elevated as noted above.  She has not had fevers.  Normal white blood cell count.  We discussed complications of choledocholithiasis including acute cholangitis, pancreatitis, obstructive jaundice, and abdominal pain.  Patient had her last dose of Eliquis yesterday.  She is scheduled for ERCP with sphincterotomy and common duct stone extraction  tomorrow, off Eliquis.the patient is HIGH RISK given her comorbidities and the need to  accommodate her anticoagulation status.  The nature of the procedure, as well as the risks, benefits, and alternatives were carefully and thoroughly reviewed with the patient. Ample time for discussion and questions allowed. The patient understood, was satisfied, and agreed to proceed.  Docia Chuck. Geri Seminole., M.D. [facility] Division of Gastroenterology

## 2021-11-03 NOTE — Progress Notes (Signed)
ANTICOAGULATION CONSULT NOTE - Initial Consult  Pharmacy Consult for heparin  Indication: bridge therapy while apixaban for hx DVT/PE on hold  Allergies  Allergen Reactions   Codeine Itching    Patient Measurements: Height: 5\' 3"  (160 cm) Weight: 44.9 kg (99 lb) IBW/kg (Calculated) : 52.4 Heparin Dosing Weight: 44.9  Vital Signs: Temp: 97.6 F (36.4 C) (09/13 1420) Temp Source: Oral (09/13 1420) BP: 128/77 (09/13 1415) Pulse Rate: 67 (09/13 1415)  Labs: Recent Labs    11/02/21 1030 11/03/21 1351  HGB 10.5* 9.6*  HCT 30.4* 28.8*  PLT 271 263  CREATININE 0.68 0.76    Estimated Creatinine Clearance: 49 mL/min (by C-G formula based on SCr of 0.76 mg/dL).   Medical History: Past Medical History:  Diagnosis Date   Colon polyps    Complication of anesthesia    tolerated propofol but other meds made her emotional / cry/ difficulty breathing/ panic   Hyperlipidemia    Rheumatic fever    Seasonal allergies    UTI (urinary tract infection)     Medications:  Apixaban 5 mg po BID last dose 9/12 at 10 am   Assessment: 66 yo F on apixaban 5 mg po bid PTA for hx PE/DVT.   Pharmacy consulted to manage heparin for bridge therapy while apixaban on hold for ERCP scheduled 9/15 @ 1300 - heparin to be held 6 hours prior to ERCP  Hg 9.6; PLT WNL, SCr WNL Baseline aPTT = 30  Wt 44.9 kg   Goal of Therapy:  Heparin level 0.3-0.7 units/ml Heparin level 66-102 units/ml Monitor platelets by anticoagulation protocol: Yes   Plan:  Draw baseline aPTT & heparin level now Start heparin at 700 units/hr and check 8 hour aPTT ERCP scheduled 9/15 @ 1300 - heparin to be held 6 hours prior to procedure at 0700 am   Eudelia Bunch, Pharm.D 11/03/2021 3:10 PM

## 2021-11-03 NOTE — ED Provider Notes (Signed)
[facility] DEPT Provider Note   CSN: [id number] Arrival date & time: 11/03/21  1015     History Past Medical History:  Diagnosis Date   Colon polyps    Complication of anesthesia    tolerated propofol but other meds made her emotional / cry/ difficulty breathing/ panic   Hyperlipidemia    Rheumatic fever    Seasonal allergies    UTI (urinary tract infection)     Chief Complaint  Patient presents with   Abdominal Pain    Sue Chase is a 66 y.o. female.  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   She states that she presented to her cancer infusion center yesterday and her labs were notable for a persistently elevated transaminases.  As a result of her labs her chemotherapy was cancelled. She mentioned to her oncologist that over the past year she has had intermittent episodes of sharp RUQ pain, the most recent of which happened this past week when she had two episodes of sharp RUQ that seemed to coincide with food ingestion. She also notes that during this time she had some acholic stools. She denies any fever, chills, chest pain, or dyspnea. Her abdominal pain is not persistent and she is able to eat solid food after the pain episode resolves.   Of note, she did have a CT scan of the abdomen and pelvis 4 weeks ago that showed choledocholithiasis. Patient was contacted by GI who asked patient to present to the ED for expedited treatment of her choledocholithiasis.   long with Cancer patient, Went yesterday for labs and infusion. After labs holdoff on infusion. Last scan found evidence of gallstones 2-3 weeks ago. Liver enzymes continue to increase.    Abdominal Pain      Home Medications Prior to Admission medications   Medication Sig Start Date End Date Taking? Authorizing Provider  albuterol (VENTOLIN HFA) 108 (90 Base)  MCG/ACT inhaler Inhale 2 puffs into the lungs every 6 (six) hours as needed for wheezing or shortness of breath. 02/21/21   Pokhrel, Corrie Mckusick, MD  apixaban (ELIQUIS) 5 MG TABS tablet Take 1 tablet (5 mg total) by mouth 2 (two) times daily. 04/30/21 07/29/21  Nafziger, Tommi Rumps, NP  folic acid (FOLVITE) 1 MG tablet Take 1 tablet (1 mg total) by mouth daily. 08/03/21   Curt Bears, MD  lidocaine-prilocaine (EMLA) cream Apply to the Port-A-Cath site 30 minutes before chemotherapy 08/03/21   Curt Bears, MD  magnesium oxide (MAG-OX) 400 (240 Mg) MG tablet Take 400 mg by mouth daily.    [provider]  mirtazapine (REMERON) 15 MG tablet Take 2 tablets (30 mg total) by mouth at bedtime. 07/06/21   Curt Bears, MD  omeprazole (PRILOSEC) 20 MG capsule Take 1 capsule (20 mg total) by mouth daily. 11/20/17   Nafziger, Tommi Rumps, NP  potassium chloride SA (KLOR-CON M) 20 MEQ tablet Take 1 tablet (20 mEq total) by mouth daily. 08/03/21   Curt Bears, MD  prochlorperazine (COMPAZINE) 10 MG tablet Take 1 tablet (10 mg total) by mouth every 6 (six) hours as needed for nausea or vomiting. 08/03/21   Curt Bears, MD      Allergies    Codeine    Review of Systems   Review of Systems  Gastrointestinal:  Positive for abdominal pain.  All other systems reviewed and are negative.   Physical Exam  Updated Vital Signs BP 118/82   Pulse 82   Temp 97.9 F (36.6 C) (Oral)   Resp 16   SpO2 99%  Physical Exam Constitutional:      General: She is not in acute distress.    Appearance: She is well-developed. She is not ill-appearing.  Eyes:     General: No scleral icterus. Cardiovascular:     Rate and Rhythm: Normal rate.     Heart sounds: Normal heart sounds.  Pulmonary:     Effort: Pulmonary effort is normal.     Breath sounds: Rales present.     Comments: At the bases  Abdominal:     General: Bowel sounds are normal. There is no distension.     Palpations: Abdomen is soft.     Tenderness:  There is no abdominal tenderness.  Skin:    Coloration: Skin is not jaundiced.  Psychiatric:        Mood and Affect: Mood normal.     ED Results / Procedures / Treatments   Labs (all labs ordered are listed, but only abnormal results are displayed) Labs Reviewed  LIPASE, BLOOD  COMPREHENSIVE METABOLIC PANEL  CBC  URINALYSIS, ROUTINE W REFLEX MICROSCOPIC    EKG None  Radiology No results found.    Medications Ordered in ED Medications - No data to display  ED Course/ Medical Decision Making/ A&P                           Medical Decision Making Patient likely with symptomatic choledocholithiasis. Low suspicion for acute cholecystitis or cholangitis.  -Obtain CMP, CBC -Obtain RUQ [place] -Will need admission for further evaluation by GI   Amount and/or Complexity of Data Reviewed Labs: ordered. Radiology: ordered.   Final Clinical Impression(s) / ED Diagnoses Final diagnoses:  None    Rx / DC Orders ED Discharge Orders     None         Rick Duff, MD 11/03/21 1145    Lacretia Leigh, MD 11/04/21 1352

## 2021-11-03 NOTE — ED Triage Notes (Signed)
Pt here with gallbladder issues, sent here for possible admission.

## 2021-11-04 DIAGNOSIS — K311 Adult hypertrophic pyloric stenosis: Secondary | ICD-10-CM

## 2021-11-04 DIAGNOSIS — K831 Obstruction of bile duct: Secondary | ICD-10-CM | POA: Diagnosis not present

## 2021-11-04 LAB — CBC
HCT: 28.1 % — ABNORMAL LOW (ref 36.0–46.0)
Hemoglobin: 9.5 g/dL — ABNORMAL LOW (ref 12.0–15.0)
MCH: 32.2 pg (ref 26.0–34.0)
MCHC: 33.8 g/dL (ref 30.0–36.0)
MCV: 95.3 fL (ref 80.0–100.0)
Platelets: 258 10*3/uL (ref 150–400)
RBC: 2.95 MIL/uL — ABNORMAL LOW (ref 3.87–5.11)
RDW: 17.6 % — ABNORMAL HIGH (ref 11.5–15.5)
WBC: 8.4 10*3/uL (ref 4.0–10.5)
nRBC: 0 % (ref 0.0–0.2)

## 2021-11-04 LAB — COMPREHENSIVE METABOLIC PANEL
ALT: 64 U/L — ABNORMAL HIGH (ref 0–44)
AST: 78 U/L — ABNORMAL HIGH (ref 15–41)
Albumin: 2.7 g/dL — ABNORMAL LOW (ref 3.5–5.0)
Alkaline Phosphatase: 473 U/L — ABNORMAL HIGH (ref 38–126)
Anion gap: 6 (ref 5–15)
BUN: 9 mg/dL (ref 8–23)
CO2: 25 mmol/L (ref 22–32)
Calcium: 8.7 mg/dL — ABNORMAL LOW (ref 8.9–10.3)
Chloride: 108 mmol/L (ref 98–111)
Creatinine, Ser: 0.64 mg/dL (ref 0.44–1.00)
GFR, Estimated: >60 mL/min (ref >60)
Glucose, Bld: 97 mg/dL (ref 70–99)
Potassium: 2.6 mmol/L — CL (ref 3.5–5.1)
Sodium: 139 mmol/L (ref 135–145)
Total Bilirubin: 3.6 mg/dL — ABNORMAL HIGH (ref 0.3–1.2)
Total Protein: 6.3 g/dL — ABNORMAL LOW (ref 6.5–8.1)

## 2021-11-04 LAB — HEPARIN LEVEL (UNFRACTIONATED)
Heparin Unfractionated: 0.33 IU/mL (ref 0.30–0.70)
Heparin Unfractionated: 0.29 IU/mL — ABNORMAL LOW (ref 0.30–0.70)
Heparin Unfractionated: 0.38 IU/mL (ref 0.30–0.70)

## 2021-11-04 LAB — APTT
aPTT: 51 seconds — ABNORMAL HIGH (ref 24–36)
aPTT: 54 seconds — ABNORMAL HIGH (ref 24–36)

## 2021-11-04 MED ORDER — ALBUTEROL SULFATE (2.5 MG/3ML) 0.083% IN NEBU: 2.5000 mg | INHALATION_SOLUTION | Freq: Four times a day (QID) | RESPIRATORY_TRACT | Status: DC | PRN

## 2021-11-04 MED ORDER — POTASSIUM CHLORIDE CRYS ER 20 MEQ PO TBCR: 40.0000 meq | EXTENDED_RELEASE_TABLET | Freq: Two times a day (BID) | ORAL | Status: DC

## 2021-11-04 MED ORDER — MAGNESIUM OXIDE -MG SUPPLEMENT 400 (240 MG) MG PO TABS
400.0000 mg | ORAL_TABLET | Freq: Every day | ORAL | Status: DC
  Administered 2021-11-04 – 2021-11-06 (×3): 400 mg via ORAL
  Filled 2021-11-04 (×3): qty 1

## 2021-11-04 MED ORDER — SODIUM CHLORIDE 0.9% FLUSH
10.0000 mL | Freq: Two times a day (BID) | INTRAVENOUS | Status: DC
  Administered 2021-11-05: 10 mL

## 2021-11-04 MED ORDER — ALBUTEROL SULFATE HFA 108 (90 BASE) MCG/ACT IN AERS: 2.0000 | INHALATION_SPRAY | Freq: Four times a day (QID) | RESPIRATORY_TRACT | Status: DC | PRN

## 2021-11-04 MED ORDER — SODIUM CHLORIDE 0.9% FLUSH
10.0000 mL | INTRAVENOUS | Status: DC | PRN
  Administered 2021-11-06: 10 mL

## 2021-11-04 MED ORDER — CHLORHEXIDINE GLUCONATE CLOTH 2 % EX PADS
6.0000 | MEDICATED_PAD | Freq: Every day | CUTANEOUS | Status: DC
  Administered 2021-11-04 – 2021-11-05 (×2): 6 via TOPICAL

## 2021-11-04 MED ORDER — ORAL CARE MOUTH RINSE: 15.0000 mL | OROMUCOSAL | Status: DC | PRN

## 2021-11-04 MED ORDER — FOLIC ACID 1 MG PO TABS
1.0000 mg | ORAL_TABLET | Freq: Every day | ORAL | Status: DC
  Administered 2021-11-04 – 2021-11-06 (×3): 1 mg via ORAL
  Filled 2021-11-04 (×3): qty 1

## 2021-11-04 MED ORDER — POTASSIUM CHLORIDE 10 MEQ/100ML IV SOLN
10.0000 meq | INTRAVENOUS | Status: AC
  Administered 2021-11-04 (×6): 10 meq via INTRAVENOUS
  Filled 2021-11-04 (×6): qty 100

## 2021-11-04 NOTE — Progress Notes (Signed)
ANTICOAGULATION CONSULT NOTE   Pharmacy Consult for heparin Indication: hx pulmonary embolus and DVT  (home Apixaban on hold)  Allergies  Allergen Reactions   Codeine Itching    Patient Measurements: Height: 5\' 3"  (160 cm) Weight: 44.9 kg (99 lb) IBW/kg (Calculated) : 52.4 Heparin Dosing Weight: total body weight   Vital Signs: Temp: 98.2 F (36.8 C) (09/14 2044) Temp Source: Oral (09/14 2044) BP: 139/80 (09/14 2044) Pulse Rate: 64 (09/14 2044)  Labs: Recent Labs    11/02/21 1030 11/03/21 1351 11/03/21 1351 11/04/21 0405 11/04/21 1400 11/04/21 1438 11/04/21 2153  HGB 10.5* 9.6*  --  9.5*  --   --   --   HCT 30.4* 28.8*  --  28.1*  --   --   --   PLT 271 263  --  258  --   --   --   APTT  --  30  --  51* 54*  --   --   HEPARINUNFRC  --  0.33   < > 0.33  --  0.29* 0.38  CREATININE 0.68 0.76  --  0.64  --   --   --    < > = values in this interval not displayed.     Estimated Creatinine Clearance: 49 mL/min (by C-G formula based on SCr of 0.64 mg/dL).   Medications:  - on Apixaban 5mg  BID PTA (last dose taken on 11/02/21 at 1000)  Assessment: Patient is a 66 y.o F with stage 4 adenocarcinoma of right lung, choledocholithiasis and PE/DVT on Apixaban PTA who presented to the ED on 11/03/21 with abdominal pain and elevated LFTs.  Apixaban placed on hold with plan for ERCP on 11/05/21.  Anticoagulation transitioned to IV heparin drip on 11/03/21.  Today, 11/04/2021: - 2153 heparin level = 0.38 units/mL, now therapeutic - CBC: Hgb 9.5, low but relatively stable. Pltc WNL.  - no bleeding or infusion issues noted per nursing   Goal of Therapy:  Heparin level 0.3-0.7 units/ml Monitor platelets by anticoagulation protocol: Yes   Plan:  - Continue heparin infusion at 850 units/hr - Check confirmatory heparin level in 6 hours to ensure remains within therapeutic range  - Will d/c heparin at 0700 on 11/05/21 per GI request in anticipation of ERCP   Lindell Spar, PharmD,  BCPS Clinical Pharmacist 11/04/2021,10:51 PM

## 2021-11-04 NOTE — Progress Notes (Addendum)
[facility] Progress Note  CC:  Choledocholithiasis   Subjective:  Not having any pain.  Tolerating diet.    Objective:  Vital signs in last 24 hours: Temp:  [97.6 F (36.4 C)-98.2 F (36.8 C)] 98.2 F (36.8 C) (09/14 0846) Pulse Rate:  [64-104] 68 (09/14 0846) Resp:  [14-19] 14 (09/14 0846) BP: (116-148)/(69-82) 140/80 (09/14 0846) SpO2:  [97 %-100 %] 100 % (09/14 0846) Weight:  [44.9 kg] 44.9 kg (09/13 1430) Last BM Date : 11/04/21 (by patient) General:  Alert, Well-developed, in NAD Heart:  Regular rate and rhythm; no murmurs Pulm:  CTAB.  No W/R/R. Abdomen:  Soft, non-distended.  BS present.  Non-tender. Extremities:  Without edema. Neurologic:  Alert and oriented x 4;  grossly normal neurologically. Psych:  Alert and cooperative. Normal mood and affect.  Intake/Output from previous day: 09/13 0701 - 09/14 0700 In: 0160 [P.O.:600; I.V.:869.5; IV Piggyback:201.4] Out: 850 [Urine:650; Emesis/NG output:200] Intake/Output this shift: Total I/O In: 120 [P.O.:120] Out: -   Lab Results: Recent Labs    11/02/21 1030 11/03/21 1351 11/04/21 0405  WBC 7.6 8.8 8.4  HGB 10.5* 9.6* 9.5*  HCT 30.4* 28.8* 28.1*  PLT 271 263 258   BMET Recent Labs    11/02/21 1030 11/03/21 1351 11/04/21 0405  NA 138 139 139  K 3.1* 2.8* 2.6*  CL 102 104 108  CO2 29 28 25   GLUCOSE 115* 109* 97  BUN 12 13 9   CREATININE 0.68 0.76 0.64  CALCIUM 9.4 9.1 8.7*   LFT Recent Labs    11/04/21 0405  PROT 6.3*  ALBUMIN 2.7*  AST 78*  ALT 64*  ALKPHOS 473*  BILITOT 3.6*   US Abdomen Limited RUQ (LIVER/GB)  Result Date: 11/03/2021 CLINICAL DATA:  Alkaline phosphatase elevation EXAM: ULTRASOUND ABDOMEN LIMITED RIGHT UPPER QUADRANT COMPARISON:  CT 10/03/2021 FINDINGS: Gallbladder: Small amount of sludge in the gallbladder. No stones. No wall thickening or surrounding fluid. No Murphy sign. Common bile duct: Diameter: 9.9 mm.  No obstructing lesion identified. Liver:  Mild intrahepatic ductal prominence. Mildly heterogeneous echotexture that could go along with mild steatosis. No evidence of focal mass. Portal vein is patent on color Doppler imaging with normal direction of blood flow towards the liver. Other: None. IMPRESSION: Mild intrahepatic biliary ductal prominence. Dilated common bile duct, approximally 1 cm. Small amount of sludge in the gallbladder. Choledocholithiasis was demonstrated at previous CT within the distal duct. The distal duct is not well seen on today's ultrasound. Electronically Signed   By: Nelson Chimes M.D.   On: 11/03/2021 12:41    Assessment / Plan: 1.  Choledocholithiasis: Intermittent right upper quadrant pain over the past year now with persistently elevated LFTs, associated symptoms of nausea and vomiting during episodes; most likely symptomatic choledocholithiasis.  LFTs trended up slightly today. 2.  Newly diagnosed lung cancer on chemotherapy and immunotherapy 3.  History of DVT/PE: On Eliquis last dose 11/03/2018 3 AM.  On heparin gtt here.  -ERCP 9/15 at 1300.  I have messaged pharmacy and asked them to hold the heparin gtt starting at 7 AM tomorrow. -Trend labs. -Will need surgical consult to discuss cholecystectomy. -Continue cipro for now.   LOS: 1 day   Sue Chase  11/04/2021, 9:42 AM  GI ATTENDING  Interval history and data reviewed.  Patient seen and examined.  Agree with interval progress note.  Patient clinically stable.  Chronic anticoagulant washing out.  Has for heparin as noted.  Hypokalemia needs  corrected.  ERCP tentatively planned for 1 PM tomorrow.  I have discussed this in detail with the patient.  Sue Chase., M.D. [facility] Division of Gastroenterology

## 2021-11-04 NOTE — Progress Notes (Signed)
ANTICOAGULATION CONSULT NOTE - Initial Consult  Pharmacy Consult for heparin  Indication: bridge therapy while apixaban for hx DVT/PE on hold  Allergies  Allergen Reactions   Codeine Itching    Patient Measurements: Height: 5\' 3"  (160 cm) Weight: 44.9 kg (99 lb) IBW/kg (Calculated) : 52.4 Heparin Dosing Weight: 44.9  Vital Signs: Temp: 97.8 F (36.6 C) (09/14 0141) Temp Source: Oral (09/14 0141) BP: 140/76 (09/14 0141) Pulse Rate: 70 (09/14 0141)  Labs: Recent Labs    11/02/21 1030 11/03/21 1351 11/04/21 0405  HGB 10.5* 9.6* 9.5*  HCT 30.4* 28.8* 28.1*  PLT 271 263 258  APTT  --  30 51*  HEPARINUNFRC  --  0.33 0.33  CREATININE 0.68 0.76  --      Estimated Creatinine Clearance: 49 mL/min (by C-G formula based on SCr of 0.76 mg/dL).   Medical History: Past Medical History:  Diagnosis Date   Colon polyps    Complication of anesthesia    tolerated propofol but other meds made her emotional / cry/ difficulty breathing/ panic   Hyperlipidemia    Rheumatic fever    Seasonal allergies    UTI (urinary tract infection)     Medications:  Apixaban 5 mg po BID last dose 9/12 at 10 am   Assessment: 66 yo F on apixaban 5 mg po bid PTA for hx PE/DVT.   Pharmacy consulted to manage heparin for bridge therapy while apixaban on hold for ERCP scheduled 9/15 @ 1300 - heparin to be held 6 hours prior to ERCP  Hg 9.6; PLT WNL, SCr WNL Baseline aPTT = 30  Wt 44.9 kg   11/04/21 Heparin level = 0.33 (same as baseline prior to heparin therapy starting) aPTT = 51 sec (subtherapeutic) with heparin gtt @ 700 units/hr Hgb = 9.5, Pltc 749S No complications of therapy noted  Goal of Therapy:  Heparin level 0.3-0.7 units/ml Heparin level 66-102 units/ml Monitor platelets by anticoagulation protocol: Yes   Plan:  Increase heparin to 800 units/hr  Recheck aPTT 8 hr after heparin rate increase Will continue to monitor heparin with aPTT until effects of apixaban have worn off  and heparin level and aPTT correlate ERCP scheduled 9/15 @ 1300 - heparin to be held 6 hours prior to procedure at 0700 am   Leone Haven, PharmD 11/04/2021 5:09 AM

## 2021-11-04 NOTE — Consult Note (Signed)
Sue Chase Sue Chase January 03, 1956  [id number].    Requesting MD: Dr. Shawna Clamp Chief Complaint/Reason for Consult: Choledocholithiasis  HPI: Sue Chase is a 66 y.o. female with a history of DVT/PE on Eliquis and lung cancer on chemotherapy who we are asked to see for possible laparoscopic cholecystectomy.  Patient reports she has been having intermittent epigastric pain, nausea/vomiting ~1x/month over the last 1 year.  This usually occurs after eating and will self resolve.  She had a scan last month that demonstrated choledocholithiasis.  She had a recurrent episode of pain last Friday and presented to the hospital for admission.  Her blood thinners have been held and GI plans ERCP tomorrow.  We were asked to see.  No prior abdominal surgeries.  ROS: ROS As above  Family History  Problem Relation Age of Onset   Arthritis Mother    Diabetes Mother    Heart disease Mother    Hyperlipidemia Mother    Hypertension Mother    16 / [place] Mother    Hearing loss Father    Liver cancer Father    Lung cancer Father    Hyperlipidemia Father    Diabetes Brother    Lung disease Maternal Grandfather        Black Lung   Diabetes Paternal Grandmother    Cancer Paternal Grandfather    Diabetes Paternal Grandfather     Past Medical History:  Diagnosis Date   Colon polyps    Complication of anesthesia    tolerated propofol but other meds made her emotional / cry/ difficulty breathing/ panic   Hyperlipidemia    Rheumatic fever    Seasonal allergies    UTI (urinary tract infection)     Past Surgical History:  Procedure Laterality Date   BRONCHIAL BIOPSY  06/08/2021   Procedure: BRONCHIAL BIOPSIES;  Surgeon: Garner Nash, DO;  Location: [facility];  Service: Pulmonary;;   BRONCHIAL BRUSHINGS  06/08/2021   Procedure: BRONCHIAL BRUSHINGS;  Surgeon: Garner Nash, DO;  Location: [facility];  Service: Pulmonary;;   BRONCHIAL NEEDLE ASPIRATION BIOPSY   06/08/2021   Procedure: BRONCHIAL NEEDLE ASPIRATION BIOPSIES;  Surgeon: Garner Nash, DO;  Location: [facility];  Service: Pulmonary;;   COLONOSCOPY  2018   IR IMAGING GUIDED PORT INSERTION  08/12/2021   SALIVARY GLAND SURGERY     LATE 80S   TUBAL LIGATION  1986   VIDEO BRONCHOSCOPY WITH RADIAL ENDOBRONCHIAL ULTRASOUND  06/08/2021   Procedure: VIDEO BRONCHOSCOPY WITH RADIAL ENDOBRONCHIAL ULTRASOUND;  Surgeon: Garner Nash, DO;  Location: [facility];  Service: Pulmonary;;    Social History:  reports that she has been smoking cigarettes. She has a 40.00 pack-year smoking history. She has never been exposed to tobacco smoke. She has never used smokeless tobacco. She reports current alcohol use. She reports that she does not use drugs.  Allergies:  Allergies  Allergen Reactions   Codeine Itching    Medications Prior to Admission  Medication Sig Dispense Refill   albuterol (VENTOLIN HFA) 108 (90 Base) MCG/ACT inhaler Inhale 2 puffs into the lungs every 6 (six) hours as needed for wheezing or shortness of breath. 8 g 1   apixaban (ELIQUIS) 5 MG TABS tablet Take 1 tablet (5 mg total) by mouth 2 (two) times daily. 268 tablet 1   folic acid (FOLVITE) 1 MG tablet Take 1 tablet (1 mg total) by mouth daily. 30 tablet 4   lidocaine-prilocaine (EMLA) cream Apply to the Port-A-Cath site 30  minutes before chemotherapy (Patient taking differently: Apply 1 Application topically daily as needed. Apply to the Port-A-Cath site 30 minutes before chemotherapy) 30 g 0   magnesium oxide (MAG-OX) 400 (240 Mg) MG tablet Take 400 mg by mouth daily.     omeprazole (PRILOSEC) 20 MG capsule Take 1 capsule (20 mg total) by mouth daily. (Patient taking differently: Take 20 mg by mouth daily as needed (For heartburn).) 30 capsule 3   potassium chloride SA (KLOR-CON M) 20 MEQ tablet Take 1 tablet (20 mEq total) by mouth daily. 10 tablet 0   prochlorperazine (COMPAZINE) 10 MG tablet Take 1 tablet (10 mg total) by  mouth every 6 (six) hours as needed for nausea or vomiting. 30 tablet 0   mirtazapine (REMERON) 15 MG tablet Take 2 tablets (30 mg total) by mouth at bedtime. (Patient not taking: Reported on 11/03/2021) 30 tablet 2     Physical Exam: Blood pressure 134/77, pulse 71, temperature 97.7 F (36.5 C), temperature source Oral, resp. rate 17, height 5\' 3"  (1.6 m), weight 44.9 kg, SpO2 100 %. General: Patient is sitting up in chair in NAD  HEENT: head is normocephalic, atraumatic.  Sclera are noninjected.  PERRL.  Ears and nose without any masses or lesions.  Mouth is pink and moist. Dentition fair Heart: regular, rate, and rhythm.   Lungs: CTAB, no wheezes, rhonchi, or rales noted.  Respiratory effort nonlabored Abd: Soft, NT, ND, +BS, no masses, hernias, or organomegaly MS: no BUE edema Skin: warm and dry with no masses, lesions, or rashes Psych: A&Ox4 with an appropriate affect Neuro: cranial nerves grossly intact, normal speech, thought process intact, moves all extremities, gait not assessed   Results for orders placed or performed during the hospital encounter of 11/03/21 (from the past 48 hour(s))  Urinalysis, Routine w reflex microscopic Urine, Clean Catch     Status: Abnormal   Collection Time: 11/03/21 12:46 PM  Result Value Ref Range   Color, Urine AMBER (A) YELLOW    Comment: BIOCHEMICALS MAY BE AFFECTED BY COLOR   APPearance CLEAR CLEAR   Specific Gravity, Urine 1.017 1.005 - 1.030   pH 5.0 5.0 - 8.0   Glucose, UA NEGATIVE NEGATIVE mg/dL   Hgb urine dipstick NEGATIVE NEGATIVE   Bilirubin Urine NEGATIVE NEGATIVE   Ketones, ur NEGATIVE NEGATIVE mg/dL   Protein, ur NEGATIVE NEGATIVE mg/dL   Nitrite NEGATIVE NEGATIVE   Leukocytes,Ua NEGATIVE NEGATIVE    Comment: Performed at [facility], [place] [address], [place], [place] [address]  Lipase, blood     Status: Abnormal   Collection Time: 11/03/21  1:51 PM  Result Value Ref Range   Lipase 88 (H) 11 - 51 U/L     Comment: Performed at [facility], [place] [address], [place], [place] [address]  Comprehensive metabolic panel     Status: Abnormal   Collection Time: 11/03/21  1:51 PM  Result Value Ref Range   Sodium 139 135 - 145 mmol/L   Potassium 2.8 (L) 3.5 - 5.1 mmol/L   Chloride 104 98 - 111 mmol/L   CO2 28 22 - 32 mmol/L   Glucose, Bld 109 (H) 70 - 99 mg/dL    Comment: Glucose reference range applies only to samples taken after fasting for at least 8 hours.   BUN 13 8 - 23 mg/dL   Creatinine, Ser 0.76 0.44 - 1.00 mg/dL   Calcium 9.1 8.9 - 10.3 mg/dL   Total Protein 6.4 (L) 6.5 -  8.1 g/dL   Albumin 2.9 (L) 3.5 - 5.0 g/dL   AST 63 (H) 15 - 41 U/L   ALT 58 (H) 0 - 44 U/L   Alkaline Phosphatase 489 (H) 38 - 126 U/L   Total Bilirubin 2.1 (H) 0.3 - 1.2 mg/dL   GFR, Estimated >60 >60 mL/min    Comment: (NOTE) Calculated using the CKD-EPI Creatinine Equation (2021)    Anion gap 7 5 - 15    Comment: Performed at [facility], [place] [address] [place], [place] [address]  CBC     Status: Abnormal   Collection Time: 11/03/21  1:51 PM  Result Value Ref Range   WBC 8.8 4.0 - 10.5 K/uL   RBC 3.03 (L) 3.87 - 5.11 MIL/uL   Hemoglobin 9.6 (L) 12.0 - 15.0 g/dL   HCT 28.8 (L) 36.0 - 46.0 %   MCV 95.0 80.0 - 100.0 fL   MCH 31.7 26.0 - 34.0 pg   MCHC 33.3 30.0 - 36.0 g/dL   RDW 17.5 (H) 11.5 - 15.5 %   Platelets 263 150 - 400 K/uL   nRBC 0.0 0.0 - 0.2 %    Comment: Performed at [facility], [place] [address], [place], [place] [address]  Magnesium     Status: Abnormal   Collection Time: 11/03/21  1:51 PM  Result Value Ref Range   Magnesium 1.6 (L) 1.7 - 2.4 mg/dL    Comment: Performed at [facility], [place] [address], [place], [place] [address]  Heparin level (unfractionated)     Status: None   Collection Time: 11/03/21  1:51 PM  Result Value Ref Range   Heparin Unfractionated 0.33 0.30 - 0.70 IU/mL    Comment: (NOTE) The  clinical reportable range upper limit is being lowered to >1.10 to align with the FDA approved guidance for the current laboratory assay.  If heparin results are below expected values, and patient dosage has  been confirmed, suggest follow up testing of antithrombin III levels. Performed at [facility], [place] [address] [place], [place] [address]   APTT     Status: None   Collection Time: 11/03/21  1:51 PM  Result Value Ref Range   aPTT 30 24 - 36 seconds    Comment: Performed at [facility], [place] [address], [place], [place] [address]  Comprehensive metabolic panel     Status: Abnormal   Collection Time: 11/04/21  4:05 AM  Result Value Ref Range   Sodium 139 135 - 145 mmol/L   Potassium 2.6 (LL) 3.5 - 5.1 mmol/L    Comment: CRITICAL RESULT CALLED TO, READ BACK BY AND VERIFIED WITH SEOARCA,J RN @ 0511 ON [id number] BY MAHMOUD,S    Chloride 108 98 - 111 mmol/L   CO2 25 22 - 32 mmol/L   Glucose, Bld 97 70 - 99 mg/dL    Comment: Glucose reference range applies only to samples taken after fasting for at least 8 hours.   BUN 9 8 - 23 mg/dL   Creatinine, Ser 0.64 0.44 - 1.00 mg/dL   Calcium 8.7 (L) 8.9 - 10.3 mg/dL   Total Protein 6.3 (L) 6.5 - 8.1 g/dL   Albumin 2.7 (L) 3.5 - 5.0 g/dL   AST 78 (H) 15 - 41 U/L   ALT 64 (H) 0 - 44 U/L   Alkaline Phosphatase 473 (H) 38 - 126 U/L   Total Bilirubin 3.6 (H) 0.3 - 1.2 mg/dL   GFR,  Estimated >60 >60 mL/min    Comment: (NOTE) Calculated using the CKD-EPI Creatinine Equation (2021)    Anion gap 6 5 - 15    Comment: Performed at [facility], [place] [address], [place], [place] [address]  CBC     Status: Abnormal   Collection Time: 11/04/21  4:05 AM  Result Value Ref Range   WBC 8.4 4.0 - 10.5 K/uL   RBC 2.95 (L) 3.87 - 5.11 MIL/uL   Hemoglobin 9.5 (L) 12.0 - 15.0 g/dL   HCT 28.1 (L) 36.0 - 46.0 %   MCV 95.3 80.0 - 100.0 fL   MCH 32.2 26.0 - 34.0 pg   MCHC 33.8 30.0 - 36.0 g/dL    RDW 17.6 (H) 11.5 - 15.5 %   Platelets 258 150 - 400 K/uL   nRBC 0.0 0.0 - 0.2 %    Comment: Performed at [facility], [place] [address], [place], [place] [address]  APTT     Status: Abnormal   Collection Time: 11/04/21  4:05 AM  Result Value Ref Range   aPTT 51 (H) 24 - 36 seconds    Comment:        IF BASELINE aPTT IS ELEVATED, SUGGEST PATIENT RISK ASSESSMENT BE USED TO DETERMINE APPROPRIATE ANTICOAGULANT THERAPY. Performed at [facility], [place] [address] [place], [place] [address]   Heparin level (unfractionated)     Status: None   Collection Time: 11/04/21  4:05 AM  Result Value Ref Range   Heparin Unfractionated 0.33 0.30 - 0.70 IU/mL    Comment: (NOTE) The clinical reportable range upper limit is being lowered to >1.10 to align with the FDA approved guidance for the current laboratory assay.  If heparin results are below expected values, and patient dosage has  been confirmed, suggest follow up testing of antithrombin III levels. Performed at [facility], [place] [address], [place], [place] [address]   APTT     Status: Abnormal   Collection Time: 11/04/21  2:00 PM  Result Value Ref Range   aPTT 54 (H) 24 - 36 seconds    Comment:        IF BASELINE aPTT IS ELEVATED, SUGGEST PATIENT RISK ASSESSMENT BE USED TO DETERMINE APPROPRIATE ANTICOAGULANT THERAPY. Performed at [facility], [place] [address] [place], [place] [address]   Heparin level (unfractionated)     Status: Abnormal   Collection Time: 11/04/21  2:38 PM  Result Value Ref Range   Heparin Unfractionated 0.29 (L) 0.30 - 0.70 IU/mL    Comment: (NOTE) The clinical reportable range upper limit is being lowered to >1.10 to align with the FDA approved guidance for the current laboratory assay.  If heparin results are below expected values, and patient dosage has  been confirmed, suggest follow up testing of antithrombin III  levels. Performed at [facility], [place] [address], [place], [place] [address]    US Abdomen Limited RUQ (LIVER/GB)  Result Date: 11/03/2021 CLINICAL DATA:  Alkaline phosphatase elevation EXAM: ULTRASOUND ABDOMEN LIMITED RIGHT UPPER QUADRANT COMPARISON:  CT 10/03/2021 FINDINGS: Gallbladder: Small amount of sludge in the gallbladder. No stones. No wall thickening or surrounding fluid. No Murphy sign. Common bile duct: Diameter: 9.9 mm.  No obstructing lesion identified. Liver: Mild intrahepatic ductal prominence. Mildly heterogeneous echotexture that could go along with mild steatosis. No evidence of focal mass. Portal vein is patent on color Doppler imaging with normal direction of blood flow towards the liver. Other:  None. IMPRESSION: Mild intrahepatic biliary ductal prominence. Dilated common bile duct, approximally 1 cm. Small amount of sludge in the gallbladder. Choledocholithiasis was demonstrated at previous CT within the distal duct. The distal duct is not well seen on today's ultrasound. Electronically Signed   By: Nelson Chimes M.D.   On: 11/03/2021 12:41    Anti-infectives (From admission, onward)    Start     Dose/Rate Route Frequency Ordered Stop   11/03/21 1800  ciprofloxacin (CIPRO) IVPB 400 mg        400 mg 200 mL/hr over 60 Minutes Intravenous 2 times daily 11/03/21 1404 11/08/21 0559       Assessment/Plan Choledocholithiasis Patient has been seen and examined.  Vitals, imaging, labs and notes reviewed.  This is a 66 year old female who presented with epigastric abdominal pain, nausea and vomiting.  She was found to have elevated LFTs and noted to have choledocholithiasis on imaging ~ 1 month ago. Her [place] here showed dilated common bile duct to 1 cm.  GI is planning ERCP tomorrow.  Since admission her pain has resolved and she is tolerating a diet without n/v or worsening pain per report. WBC wnl.  No evidence of cholecystitis on [place].  She was not noted to have any  further stones on ultrasound within her gallbladder but did have some small amount sludge. Patient is currently on Chemotherapy for Lung CA. We discussed risk versus benefits of surgery.  After discussion patient would like to to hold off on surgery at this time. Agree with this/feel this is very reasonable to do a trial of non-operative management. Could try Ursodiol for sludge seen on ultrasound but would likely have to check with oncology to ensure patient is able to take this.  She does not need any antibiotics from our standpoint.  We will be is available as needed moving forward. Please call back with any questions or concerns.   Jillyn Ledger, [facility] 11/04/2021, 4:32 PM Please see Amion for pager number during day hours 7:00am-4:30pm

## 2021-11-04 NOTE — Progress Notes (Signed)
PROGRESS NOTE    Sue Chase  WLN:[id number] DOB: 23-Dec-1955 DOA: 11/03/2021 PCP: [name], NP   Brief Narrative:  This 66 years old female with PMH significant for DVT/PE on Eliquis, GERD, adenocarcinoma lung, presented in the ED with complaints  of nausea, vomiting and abdominal pain.  Patient reports her symptoms started 5 days ago she, has developed right upper quadrant abdominal pain shortly after eating,  accompanied by right-sided chest pain.  She has tried Tums and laying down , states vomiting seems to make things better.  When she went to chemo session yesterday it was noted that her LFTs were elevated so the session was canceled and she was recommended to come to the ED.  RUQ ultrasound shows dilated common bile duct approximately 1 cm sludge in gallbladder, choledocholithiasis was demonstrated on previous CT scan within the distal duct.  GI is consulted and general surgery is consulted.  Patient is scheduled for ERCP tomorrow.   Assessment & Plan:   Principal Problem:   Biliary obstruction Active Problems:   Tobacco use   Adenocarcinoma of right lung, stage 4 (HCC)   Choledocholithiasis   DVT (deep venous thrombosis) (HCC)   GERD (gastroesophageal reflux disease)   Hypokalemia   Normocytic anemia  Biliary obstruction Choledocholithiasis: Patient presented with abdominal pain, nausea and vomiting RUQ ultrasound shows dilated common bile duct. GI is consulted, started on ciprofloxacin. Continue pain control, IV fluids,  antiemetics. Patient is  scheduled for ERCP tomorrow. General surgery consulted for possible cholecystectomy.  Metastatic lung carcinoma: She follows up with Dr. Julien Nordmann Continue supportive care and outpatient follow-up  DVT/PE on Eliquis: Hold Eliquis for now Continue heparin GTT for now  GERD: Continue pantoprazole 40 mg daily  Normocytic anemia: Hemoglobin is stable,  no evidence of active bleeding  Hypokalemia: Replaced.   Continue to monitor.  Tobacco abuse: Counseled on quit smoking  DVT prophylaxis: Heparin gtt. Code Status: Full code Family Communication: No family at bedside Disposition Plan:   Status is: Inpatient Remains inpatient appropriate because: Admitted for choledocholithiasis requiring ERCP today General surgery is consulted for possible cholecystectomy. Anticipated discharge home in few days.   Consultants:  General surgery GI  Procedures: CT abdomen, RUQ ultrasound Antimicrobials:  Anti-infectives (From admission, onward)    Start     Dose/Rate Route Frequency Ordered Stop   11/03/21 1800  ciprofloxacin (CIPRO) IVPB 400 mg        400 mg 200 mL/hr over 60 Minutes Intravenous 2 times daily 11/03/21 1404 11/08/21 0559       Subjective: Patient was seen and examined at bedside.  Overnight events noted.   Patient reports abdominal pain is improving,  nausea and vomiting has also improved.  Objective: Vitals:   11/04/21 0528 11/04/21 0846 11/04/21 1300 11/04/21 1328  BP: (!) 143/69 (!) 140/80 (!) 143/76 134/77  Pulse: 64 68 73 71  Resp: 19 14 12 17   Temp: 97.6 F (36.4 C) 98.2 F (36.8 C) 98.3 F (36.8 C) 97.7 F (36.5 C)  TempSrc: Oral Oral Oral Oral  SpO2: 98% 100% 100% 100%  Weight:      Height:        Intake/Output Summary (Last 24 hours) at 11/04/2021 1350 Last data filed at 11/04/2021 0810 Gross per 24 hour  Intake 1790.98 ml  Output 850 ml  Net 940.98 ml   Filed Weights   11/03/21 1430  Weight: 44.9 kg    Examination:  General exam: Appears comfortable, not in any acute distress.  Deconditioned Respiratory system: CTA bilaterally, no wheezing, no crackles, normal respiratory effort. Cardiovascular system: S1 & S2 heard, regular rate and rhythm, no murmur. Gastrointestinal system: Abdomen is soft, mildly sore, non distended, BS+ Central nervous system: Alert and oriented x 3 . No focal neurological deficits. Extremities: No edema, no cyanosis, no  clubbing. Skin: No rashes, lesions or ulcers Psychiatry: Judgement and insight appear normal. Mood & affect appropriate.     Data Reviewed: I have personally reviewed following labs and imaging studies  CBC: Recent Labs  Lab 11/02/21 1030 11/03/21 1351 11/04/21 0405  WBC 7.6 8.8 8.4  NEUTROABS 4.3  --   --   HGB 10.5* 9.6* 9.5*  HCT 30.4* 28.8* 28.1*  MCV 92.1 95.0 95.3  PLT 271 263 735   Basic Metabolic Panel: Recent Labs  Lab 11/02/21 1030 11/03/21 1351 11/04/21 0405  NA 138 139 139  K 3.1* 2.8* 2.6*  CL 102 104 108  CO2 29 28 25   GLUCOSE 115* 109* 97  BUN 12 13 9   CREATININE 0.68 0.76 0.64  CALCIUM 9.4 9.1 8.7*  MG  --  1.6*  --    GFR: Estimated Creatinine Clearance: 49 mL/min (by C-G formula based on SCr of 0.64 mg/dL). Liver Function Tests: Recent Labs  Lab 11/02/21 1030 11/03/21 1351 11/04/21 0405  AST 53* 63* 78*  ALT 65* 58* 64*  ALKPHOS 562* 489* 473*  BILITOT 1.4* 2.1* 3.6*  PROT 7.2 6.4* 6.3*  ALBUMIN 3.6 2.9* 2.7*   Recent Labs  Lab 11/03/21 1351  LIPASE 88*   No results for input(s): "AMMONIA" in the last 168 hours. Coagulation Profile: No results for input(s): "INR", "PROTIME" in the last 168 hours. Cardiac Enzymes: No results for input(s): "CKTOTAL", "CKMB", "CKMBINDEX", "TROPONINI" in the last 168 hours. BNP (last 3 results) No results for input(s): "PROBNP" in the last 8760 hours. HbA1C: No results for input(s): "HGBA1C" in the last 72 hours. CBG: No results for input(s): "GLUCAP" in the last 168 hours. Lipid Profile: No results for input(s): "CHOL", "HDL", "LDLCALC", "TRIG", "CHOLHDL", "LDLDIRECT" in the last 72 hours. Thyroid Function Tests: No results for input(s): "TSH", "T4TOTAL", "FREET4", "T3FREE", "THYROIDAB" in the last 72 hours. Anemia Panel: No results for input(s): "VITAMINB12", "FOLATE", "FERRITIN", "TIBC", "IRON", "RETICCTPCT" in the last 72 hours. Sepsis Labs: No results for input(s): "PROCALCITON",  "LATICACIDVEN" in the last 168 hours.  No results found for this or any previous visit (from the past 240 hour(s)).   Radiology Studies: US Abdomen Limited RUQ (LIVER/GB)  Result Date: 11/03/2021 CLINICAL DATA:  Alkaline phosphatase elevation EXAM: ULTRASOUND ABDOMEN LIMITED RIGHT UPPER QUADRANT COMPARISON:  CT 10/03/2021 FINDINGS: Gallbladder: Small amount of sludge in the gallbladder. No stones. No wall thickening or surrounding fluid. No Murphy sign. Common bile duct: Diameter: 9.9 mm.  No obstructing lesion identified. Liver: Mild intrahepatic ductal prominence. Mildly heterogeneous echotexture that could go along with mild steatosis. No evidence of focal mass. Portal vein is patent on color Doppler imaging with normal direction of blood flow towards the liver. Other: None. IMPRESSION: Mild intrahepatic biliary ductal prominence. Dilated common bile duct, approximally 1 cm. Small amount of sludge in the gallbladder. Choledocholithiasis was demonstrated at previous CT within the distal duct. The distal duct is not well seen on today's ultrasound. Electronically Signed   By: Nelson Chimes M.D.   On: 11/03/2021 12:41    Scheduled Meds:  Chlorhexidine Gluconate Cloth  6 each Topical Daily   folic acid  1 mg Oral Daily  magnesium oxide  400 mg Oral Daily   sodium chloride flush  10-40 mL Intracatheter Q12H   Continuous Infusions:  sodium chloride 75 mL/hr at 11/04/21 0854   ciprofloxacin 400 mg (11/04/21 0559)   heparin 800 Units/hr (11/04/21 0534)   potassium chloride 10 mEq (11/04/21 1320)     LOS: 1 day    Time spent: 50 mins    Alyssamarie Mounsey, MD [facility]   If 7PM-7AM, please contact night-coverage

## 2021-11-04 NOTE — Progress Notes (Signed)
[place] for heparin Indication: hx pulmonary embolus and DVT (homeEliquis on hold)  Allergies  Allergen Reactions   Codeine Itching    Patient Measurements: Height: 5\' 3"  (160 cm) Weight: 44.9 kg (99 lb) IBW/kg (Calculated) : 52.4 Heparin Dosing Weight: 45 kg  Vital Signs: Temp: 97.7 F (36.5 C) (09/14 1328) Temp Source: Oral (09/14 1328) BP: 134/77 (09/14 1328) Pulse Rate: 71 (09/14 1328)  Labs: Recent Labs    11/02/21 1030 11/03/21 1351 11/04/21 0405  HGB 10.5* 9.6* 9.5*  HCT 30.4* 28.8* 28.1*  PLT 271 263 258  APTT  --  30 51*  HEPARINUNFRC  --  0.33 0.33  CREATININE 0.68 0.76 0.64    Estimated Creatinine Clearance: 49 mL/min (by C-G formula based on SCr of 0.64 mg/dL).   Medications:  - on Eliquis 5 mg bid PTA (last dose taken on 11/02/21)  Assessment: Patient is a 66 y.o F with stage 4 adenocarcinoma of right lung, choledocholithiasis and PE/DVT on Eliquis PTA who presented to the ED on 11/03/21 with abdominal pain and elevated LFTs.  Eliquis placed on hold with plan for ERCP on 11/05/21.  Anticoag. transitioned to heparin drip on 11/03/21.  Today, 11/04/2021: - heparin level is subtherapeutic at 0.29, aPTT below goal at 54 secs -levels now correlating so will monitor using only heparin level - cbc somewhat stable - no bleeding documented  Goal of Therapy:  Heparin level 0.3-0.7 units/ml aPTT 66-102 seconds Monitor platelets by anticoagulation protocol: Yes   Plan:  - increase heparin drip to 850 units.hr - check 6 hr heparin level  - Will d/c heparin at 7a on 11/05/21 per GI request in anticipation for ERCP  Lynelle Doctor 11/04/2021,3:34 PM

## 2021-11-04 NOTE — H&P (View-Only) (Signed)
[facility] Progress Note  CC:  Choledocholithiasis   Subjective:  Not having any pain.  Tolerating diet.    Objective:  Vital signs in last 24 hours: Temp:  [97.6 F (36.4 C)-98.2 F (36.8 C)] 98.2 F (36.8 C) (09/14 0846) Pulse Rate:  [64-104] 68 (09/14 0846) Resp:  [14-19] 14 (09/14 0846) BP: (116-148)/(69-82) 140/80 (09/14 0846) SpO2:  [97 %-100 %] 100 % (09/14 0846) Weight:  [44.9 kg] 44.9 kg (09/13 1430) Last BM Date : 11/04/21 (by patient) General:  Alert, Well-developed, in NAD Heart:  Regular rate and rhythm; no murmurs Pulm:  CTAB.  No W/R/R. Abdomen:  Soft, non-distended.  BS present.  Non-tender. Extremities:  Without edema. Neurologic:  Alert and oriented x 4;  grossly normal neurologically. Psych:  Alert and cooperative. Normal mood and affect.  Intake/Output from previous day: 09/13 0701 - 09/14 0700 In: 3267 [P.O.:600; I.V.:869.5; IV Piggyback:201.4] Out: 850 [Urine:650; Emesis/NG output:200] Intake/Output this shift: Total I/O In: 120 [P.O.:120] Out: -   Lab Results: Recent Labs    11/02/21 1030 11/03/21 1351 11/04/21 0405  WBC 7.6 8.8 8.4  HGB 10.5* 9.6* 9.5*  HCT 30.4* 28.8* 28.1*  PLT 271 263 258   BMET Recent Labs    11/02/21 1030 11/03/21 1351 11/04/21 0405  NA 138 139 139  K 3.1* 2.8* 2.6*  CL 102 104 108  CO2 29 28 25   GLUCOSE 115* 109* 97  BUN 12 13 9   CREATININE 0.68 0.76 0.64  CALCIUM 9.4 9.1 8.7*   LFT Recent Labs    11/04/21 0405  PROT 6.3*  ALBUMIN 2.7*  AST 78*  ALT 64*  ALKPHOS 473*  BILITOT 3.6*   US Abdomen Limited RUQ (LIVER/GB)  Result Date: 11/03/2021 CLINICAL DATA:  Alkaline phosphatase elevation EXAM: ULTRASOUND ABDOMEN LIMITED RIGHT UPPER QUADRANT COMPARISON:  CT 10/03/2021 FINDINGS: Gallbladder: Small amount of sludge in the gallbladder. No stones. No wall thickening or surrounding fluid. No Murphy sign. Common bile duct: Diameter: 9.9 mm.  No obstructing lesion identified. Liver:  Mild intrahepatic ductal prominence. Mildly heterogeneous echotexture that could go along with mild steatosis. No evidence of focal mass. Portal vein is patent on color Doppler imaging with normal direction of blood flow towards the liver. Other: None. IMPRESSION: Mild intrahepatic biliary ductal prominence. Dilated common bile duct, approximally 1 cm. Small amount of sludge in the gallbladder. Choledocholithiasis was demonstrated at previous CT within the distal duct. The distal duct is not well seen on today's ultrasound. Electronically Signed   By: Nelson Chimes M.D.   On: 11/03/2021 12:41    Assessment / Plan: 1.  Choledocholithiasis: Intermittent right upper quadrant pain over the past year now with persistently elevated LFTs, associated symptoms of nausea and vomiting during episodes; most likely symptomatic choledocholithiasis.  LFTs trended up slightly today. 2.  Newly diagnosed lung cancer on chemotherapy and immunotherapy 3.  History of DVT/PE: On Eliquis last dose 11/03/2018 3 AM.  On heparin gtt here.  -ERCP 9/15 at 1300.  I have messaged pharmacy and asked them to hold the heparin gtt starting at 7 AM tomorrow. -Trend labs. -Will need surgical consult to discuss cholecystectomy. -Continue cipro for now.   LOS: 1 day   Laban Emperor. Zehr  11/04/2021, 9:42 AM  GI ATTENDING  Interval history and data reviewed.  Patient seen and examined.  Agree with interval progress note.  Patient clinically stable.  Chronic anticoagulant washing out.  Has for heparin as noted.  Hypokalemia needs  corrected.  ERCP tentatively planned for 1 PM tomorrow.  I have discussed this in detail with the patient.  Docia Chuck. Geri Seminole., M.D. [facility] Division of Gastroenterology

## 2021-11-05 ENCOUNTER — Encounter (HOSPITAL_COMMUNITY): Payer: Self-pay | Admitting: Internal Medicine

## 2021-11-05 ENCOUNTER — Inpatient Hospital Stay (HOSPITAL_COMMUNITY): Payer: Medicare Other

## 2021-11-05 ENCOUNTER — Encounter (HOSPITAL_COMMUNITY)
Admission: EM | Disposition: A | Discharge status: Home / Self Care | Payer: Self-pay | Source: Home / Self Care | Attending: Family Medicine

## 2021-11-05 ENCOUNTER — Inpatient Hospital Stay (HOSPITAL_COMMUNITY): Payer: Medicare Other | Admitting: Anesthesiology

## 2021-11-05 ENCOUNTER — Encounter: Payer: Self-pay | Admitting: Internal Medicine

## 2021-11-05 DIAGNOSIS — K838 Other specified diseases of biliary tract: Secondary | ICD-10-CM

## 2021-11-05 DIAGNOSIS — F17210 Nicotine dependence, cigarettes, uncomplicated: Secondary | ICD-10-CM

## 2021-11-05 DIAGNOSIS — K831 Obstruction of bile duct: Secondary | ICD-10-CM | POA: Diagnosis not present

## 2021-11-05 DIAGNOSIS — K8050 Calculus of bile duct without cholangitis or cholecystitis without obstruction: Secondary | ICD-10-CM

## 2021-11-05 DIAGNOSIS — D649 Anemia, unspecified: Secondary | ICD-10-CM

## 2021-11-05 HISTORY — PX: SPHINCTEROTOMY: SHX5544

## 2021-11-05 HISTORY — PX: ERCP: SHX5425

## 2021-11-05 HISTORY — PX: REMOVAL OF STONES: SHX5545

## 2021-11-05 LAB — HEPARIN LEVEL (UNFRACTIONATED): Heparin Unfractionated: 0.34 IU/mL (ref 0.30–0.70)

## 2021-11-05 LAB — COMPREHENSIVE METABOLIC PANEL
ALT: 54 U/L — ABNORMAL HIGH (ref 0–44)
AST: 59 U/L — ABNORMAL HIGH (ref 15–41)
Albumin: 2.6 g/dL — ABNORMAL LOW (ref 3.5–5.0)
Alkaline Phosphatase: 488 U/L — ABNORMAL HIGH (ref 38–126)
Anion gap: 5 (ref 5–15)
BUN: 8 mg/dL (ref 8–23)
CO2: 26 mmol/L (ref 22–32)
Calcium: 8.7 mg/dL — ABNORMAL LOW (ref 8.9–10.3)
Chloride: 110 mmol/L (ref 98–111)
Creatinine, Ser: 0.6 mg/dL (ref 0.44–1.00)
GFR, Estimated: >60 mL/min (ref >60)
Glucose, Bld: 110 mg/dL — ABNORMAL HIGH (ref 70–99)
Potassium: 3.1 mmol/L — ABNORMAL LOW (ref 3.5–5.1)
Sodium: 141 mmol/L (ref 135–145)
Total Bilirubin: 1.9 mg/dL — ABNORMAL HIGH (ref 0.3–1.2)
Total Protein: 6.2 g/dL — ABNORMAL LOW (ref 6.5–8.1)

## 2021-11-05 LAB — CBC
HCT: 26.2 % — ABNORMAL LOW (ref 36.0–46.0)
Hemoglobin: 8.6 g/dL — ABNORMAL LOW (ref 12.0–15.0)
MCH: 31.5 pg (ref 26.0–34.0)
MCHC: 32.8 g/dL (ref 30.0–36.0)
MCV: 96 fL (ref 80.0–100.0)
Platelets: 214 10*3/uL (ref 150–400)
RBC: 2.73 MIL/uL — ABNORMAL LOW (ref 3.87–5.11)
RDW: 18.4 % — ABNORMAL HIGH (ref 11.5–15.5)
WBC: 6.6 10*3/uL (ref 4.0–10.5)
nRBC: 0 % (ref 0.0–0.2)

## 2021-11-05 SURGERY — ERCP, WITH INTERVENTION IF INDICATED
Anesthesia: General

## 2021-11-05 MED ORDER — PHENYLEPHRINE HCL (PRESSORS) 10 MG/ML IV SOLN
INTRAVENOUS | Status: DC | PRN
  Administered 2021-11-05: 160 ug via INTRAVENOUS

## 2021-11-05 MED ORDER — GLUCAGON HCL RDNA (DIAGNOSTIC) 1 MG IJ SOLR
INTRAMUSCULAR | Status: AC
  Filled 2021-11-05: qty 2

## 2021-11-05 MED ORDER — PHENYLEPHRINE HCL-NACL 20-0.9 MG/250ML-% IV SOLN
INTRAVENOUS | Status: AC
  Filled 2021-11-05: qty 250

## 2021-11-05 MED ORDER — CIPROFLOXACIN IN D5W 400 MG/200ML IV SOLN
INTRAVENOUS | Status: AC
  Filled 2021-11-05: qty 200

## 2021-11-05 MED ORDER — HEPARIN (PORCINE) 25000 UT/250ML-% IV SOLN
850.0000 [IU]/h | INTRAVENOUS | Status: DC
  Administered 2021-11-06: 850 [IU]/h via INTRAVENOUS
  Filled 2021-11-05: qty 250

## 2021-11-05 MED ORDER — SUGAMMADEX SODIUM 200 MG/2ML IV SOLN
INTRAVENOUS | Status: DC | PRN
  Administered 2021-11-05: 200 mg via INTRAVENOUS

## 2021-11-05 MED ORDER — INDOMETHACIN 50 MG RE SUPP
RECTAL | Status: AC
  Filled 2021-11-05: qty 2

## 2021-11-05 MED ORDER — LACTATED RINGERS IV SOLN: INTRAVENOUS | Status: DC | PRN

## 2021-11-05 MED ORDER — PROPOFOL 10 MG/ML IV BOLUS
INTRAVENOUS | Status: DC | PRN
  Administered 2021-11-05: 100 mg via INTRAVENOUS

## 2021-11-05 MED ORDER — ROCURONIUM BROMIDE 100 MG/10ML IV SOLN
INTRAVENOUS | Status: DC | PRN
  Administered 2021-11-05: 50 mg via INTRAVENOUS

## 2021-11-05 MED ORDER — POTASSIUM CHLORIDE 20 MEQ PO PACK
40.0000 meq | PACK | Freq: Once | ORAL | Status: AC
  Administered 2021-11-05: 40 meq via ORAL
  Filled 2021-11-05: qty 2

## 2021-11-05 MED ORDER — FENTANYL CITRATE (PF) 100 MCG/2ML IJ SOLN
INTRAMUSCULAR | Status: DC | PRN
  Administered 2021-11-05 (×2): 50 ug via INTRAVENOUS

## 2021-11-05 MED ORDER — ONDANSETRON HCL 4 MG/2ML IJ SOLN
INTRAMUSCULAR | Status: DC | PRN
  Administered 2021-11-05: 4 mg via INTRAVENOUS

## 2021-11-05 MED ORDER — SODIUM CHLORIDE 0.9 % IV SOLN
INTRAVENOUS | Status: DC | PRN
  Administered 2021-11-05: 100 mL

## 2021-11-05 MED ORDER — DEXAMETHASONE SODIUM PHOSPHATE 10 MG/ML IJ SOLN
INTRAMUSCULAR | Status: DC | PRN
  Administered 2021-11-05: 8 mg via INTRAVENOUS

## 2021-11-05 MED ORDER — ESMOLOL HCL 100 MG/10ML IV SOLN
INTRAVENOUS | Status: DC | PRN
  Administered 2021-11-05 (×2): 30 mg via INTRAVENOUS

## 2021-11-05 MED ORDER — DICLOFENAC SUPPOSITORY 100 MG
RECTAL | Status: AC
  Filled 2021-11-05: qty 1

## 2021-11-05 MED ORDER — FENTANYL CITRATE (PF) 100 MCG/2ML IJ SOLN
INTRAMUSCULAR | Status: AC
  Filled 2021-11-05: qty 2

## 2021-11-05 NOTE — Progress Notes (Signed)
ANTICOAGULATION CONSULT NOTE   Pharmacy Consult for heparin Indication: hx pulmonary embolus and DVT  (home Apixaban on hold)  Allergies  Allergen Reactions   Codeine Itching    Patient Measurements: Height: 5\' 3"  (160 cm) Weight: 44.9 kg (99 lb) IBW/kg (Calculated) : 52.4 Heparin Dosing Weight: total body weight   Vital Signs: Temp: 97.8 F (36.6 C) (09/15 1440) Temp Source: Oral (09/15 1440) BP: 164/60 (09/15 1443) Pulse Rate: 72 (09/15 1450)  Labs: Recent Labs    11/03/21 1351 11/04/21 0405 11/04/21 1400 11/04/21 1438 11/04/21 2153 11/05/21 0444  HGB 9.6* 9.5*  --   --   --  8.6*  HCT 28.8* 28.1*  --   --   --  26.2*  PLT 263 258  --   --   --  214  APTT 30 51* 54*  --   --   --   HEPARINUNFRC 0.33 0.33  --  0.29* 0.38 0.34  CREATININE 0.76 0.64  --   --   --  0.60     Estimated Creatinine Clearance: 49 mL/min (by C-G formula based on SCr of 0.6 mg/dL).   Medications:  - on Apixaban 5mg  BID PTA (last dose taken on 11/02/21 at 1000)  Assessment: Patient is a 66 y.o F with stage 4 adenocarcinoma of right lung, choledocholithiasis and PE/DVT on Apixaban PTA who presented to the ED on 11/03/21 with abdominal pain and elevated LFTs.  Apixaban placed on hold with plan for ERCP on 11/05/21.  Anticoagulation transitioned to IV heparin drip on 11/03/21.  Today, 11/05/2021: -Heparin off this morning at 0700 for ERCP -Heparin previously therapeutic at 850 units/hr -CBC: Hgb decreased to 8.6, Pltc WNL   Goal of Therapy:  Heparin level 0.3-0.7 units/ml Monitor platelets by anticoagulation protocol: Yes   Plan:  -Per GI procedure report, hold heparin for 12 hours then resume WITHOUT bolus (case end 1424)  -Resume heparin infusion at 850 units/hr on 9/16 at 0300 -Check heparin level 6 hours after infusion resumed -Daily CBC and heparin level   Tawnya Crook, PharmD, BCPS Clinical Pharmacist 11/05/2021 3:13 PM

## 2021-11-05 NOTE — Interval H&P Note (Signed)
History and Physical Interval Note:  11/05/2021 12:13 PM  Sue Chase  has presented today for surgery, with the diagnosis of Choledocholithiasis, RUQ abdominal pain.  The various methods of treatment have been discussed with the patient and family. After consideration of risks, benefits and other options for treatment, the patient has consented to  Procedure(s): ENDOSCOPIC RETROGRADE CHOLANGIOPANCREATOGRAPHY (ERCP) (N/A) as a surgical intervention.  The patient's history has been reviewed, patient examined, no change in status, stable for surgery.  I have reviewed the patient's chart and labs.  Questions were answered to the patient's satisfaction.     Scarlette Shorts

## 2021-11-05 NOTE — Progress Notes (Signed)
ANTICOAGULATION CONSULT NOTE   Pharmacy Consult for heparin Indication: hx pulmonary embolus and DVT  (home Apixaban on hold)  Allergies  Allergen Reactions   Codeine Itching    Patient Measurements: Height: 5\' 3"  (160 cm) Weight: 44.9 kg (99 lb) IBW/kg (Calculated) : 52.4 Heparin Dosing Weight: total body weight   Vital Signs: Temp: 98.2 F (36.8 C) (09/14 2044) Temp Source: Oral (09/14 2044) BP: 139/80 (09/14 2044) Pulse Rate: 64 (09/14 2044)  Labs: Recent Labs    11/02/21 1030 11/02/21 1030 11/03/21 1351 11/04/21 0405 11/04/21 1400 11/04/21 1438 11/04/21 2153 11/05/21 0444  HGB 10.5*   < > 9.6* 9.5*  --   --   --  8.6*  HCT 30.4*  --  28.8* 28.1*  --   --   --  26.2*  PLT 271  --  263 258  --   --   --  214  APTT  --   --  30 51* 54*  --   --   --   HEPARINUNFRC  --    < > 0.33 0.33  --  0.29* 0.38 0.34  CREATININE 0.68  --  0.76 0.64  --   --   --   --    < > = values in this interval not displayed.     Estimated Creatinine Clearance: 49 mL/min (by C-G formula based on SCr of 0.64 mg/dL).   Medications:  - on Apixaban 5mg  BID PTA (last dose taken on 11/02/21 at 1000)  Assessment: Patient is a 66 y.o F with stage 4 adenocarcinoma of right lung, choledocholithiasis and PE/DVT on Apixaban PTA who presented to the ED on 11/03/21 with abdominal pain and elevated LFTs.  Apixaban placed on hold with plan for ERCP on 11/05/21.  Anticoagulation transitioned to IV heparin drip on 11/03/21.  Today, 11/05/2021: - AM heparin level = 0.34 units/mL, remains therapeutic - CBC: Hgb decreased to 8.6. Pltc WNL.  - no bleeding or infusion issues noted per nursing   Goal of Therapy:  Heparin level 0.3-0.7 units/ml Monitor platelets by anticoagulation protocol: Yes   Plan:  - Continue heparin infusion at 850 units/hr - Will d/c heparin at 0700 today per GI request in anticipation of ERCP - F/u plans for resumption of anticoagulation post-procedure   Lindell Spar, PharmD,  BCPS Clinical Pharmacist 11/05/2021,5:22 AM

## 2021-11-05 NOTE — Transfer of Care (Signed)
Immediate Anesthesia Transfer of Care Note  Patient: Sue Chase  Procedure(s) Performed: Procedure(s): ENDOSCOPIC RETROGRADE CHOLANGIOPANCREATOGRAPHY (ERCP) (N/A) SPHINCTEROTOMY REMOVAL OF STONES  Patient Location: PACU  Anesthesia Type:General  Level of Consciousness: Alert, Awake, Oriented  Airway & Oxygen Therapy: Patient Spontanous Breathing  Post-op Assessment: Report given to RN  Post vital signs: Reviewed and stable  Last Vitals:  Vitals:   11/05/21 1440 11/05/21 1443  BP: (!) 143/82   Pulse: 84 84  Resp: 14 17  Temp: (P) 36.6 C   SpO2: 034% 917%    Complications: No apparent anesthesia complications

## 2021-11-05 NOTE — TOC Progression Note (Signed)
Transition of Care [facility]) - Progression Note    Patient Details  Name: Sue Chase MRN: [id number] Date of Birth: 03-25-1955  Transition of Care [facility]) CM/SW Contact  Servando Snare, [place] Phone Number: 11/05/2021, 9:20 AM  Clinical Narrative:      Transition of Care [facility]) Screening Note   Patient Details  Name: Sue Chase Date of Birth: 09/24/1955   Transition of Care [facility]) CM/SW Contact:    Servando Snare, LCSW Phone Number: 11/05/2021, 9:20 AM    Transition of Care Department [facility]) has reviewed patient and no TOC needs have been identified at this time. We will continue to monitor patient advancement through interdisciplinary progression rounds. If new patient transition needs arise, please place a TOC consult.         Expected Discharge Plan and Services                                                 Social Determinants of Health (SDOH) Interventions    Readmission Risk Interventions     No data to display

## 2021-11-05 NOTE — Op Note (Signed)
[facility] Patient Name: Sue Chase Procedure Date: 11/05/2021 MRN: [id number] Attending MD: Docia Chuck. Henrene Pastor , MD Date of Birth: 03/11/1955 CSN: [id number] Age: 66 Admit Type: Inpatient Procedure:                ERCP with sphincterotomy and common duct stone                            extraction Indications:              Abdominal pain in the right upper quadrant, Bile                            duct stone on Computed Tomogram Scan, Abnormal                            liver function test Providers:                Docia Chuck. Henrene Pastor, MD, Dulcy Fanny, [place], Technician, Brien Mates, RNFA Referring MD:              Medicines:                General Anesthesia Complications:            No immediate complications. Estimated Blood Loss:     Estimated blood loss: none. Procedure:                Pre-Anesthesia Assessment:                           - Prior to the procedure, a History and Physical                            was performed, and patient medications and                            allergies were reviewed. The patient is competent.                            The risks and benefits of the procedure and the                            sedation options and risks were discussed with the                            patient. All questions were answered and informed                            consent was obtained. Patient identification and                            proposed procedure were verified by the physician.  Mental Status Examination: alert and oriented.                            Airway Examination: normal oropharyngeal airway and                            neck mobility. Respiratory Examination: clear to                            auscultation. CV Examination: normal. Prophylactic                            Antibiotics: The patient does not require                            prophylactic  antibiotics. Prior Anticoagulants: The                            patient has taken heparin, last dose was day of                            procedure. ASA Grade Assessment: III - A patient                            with severe systemic disease. After reviewing the                            risks and benefits, the patient was deemed in                            satisfactory condition to undergo the procedure.                            The anesthesia plan was to use moderate sedation /                            analgesia (conscious sedation). Immediately prior                            to administration of medications, the patient was                            re-assessed for adequacy to receive sedatives. The                            heart rate, respiratory rate, oxygen saturations,                            blood pressure, adequacy of pulmonary ventilation,                            and response to care were monitored throughout the  procedure. The physical status of the patient was                            re-assessed after the procedure.                           After obtaining informed consent, the scope was                            passed under direct vision. Throughout the                            procedure, the patient's blood pressure, pulse, and                            oxygen saturations were monitored continuously. The                            [id number]-Q190V ([id number] Olympus duodenoscope was                            introduced through the mouth, and used to inject                            contrast into and used to inject contrast into the                            bile duct. The ERCP was accomplished without                            difficulty. The patient tolerated the procedure                            well. Scope In: Scope Out: Findings:      1. The side-viewing scope was passed blindly into the esophagus. The        stomach revealed significant pyloric stenosis. The endoscope Tip Cap       caused a superficial laceration of the gastric mucosa along the lesser       curvature. There was self-limited moderate bleeding related to the       trauma.      2. The pyloric stenosis was overcome with scope pressure.      3. There were 2 large duodenal diverticula. A bulbous ampulla sat within       one of the diverticula. See image.      4. Scout radiograph of the abdomen with the endoscope and position was       unremarkable      5. The common bile duct was selectively intubated with dye and guidewire       on the first attempt. The bile duct was diffusely dilated with a maximal       diameter of 15 mm. There were multiple stones in the bile duct, the       largest of which was 15 mm. The gallbladder filled      6. A biliary sphincterotomy was made by cutting in the 12:00 orientation  with the ERBE system. No bleeding.      7. A 15 mm - 18 mm balloon was used to sweep the duct multiple times.       Multiple stones removed. Bile duct was cleared.      8. No pancreatic duct manipulation or injection. Impression:               1. Choledocholithiasis status post ERC with biliary                            sphincterotomy and bile duct stone extraction with                            duct clearance                           2. Duodenal diverticulum and periampullary                            diverticulum                           3. Pyloric stenosis                           4. Gastric laceration as described                           5. No attempt or manipulation of the pancreatic                            duct.                           6. Cholelithiasis Moderate Sedation:      none Recommendation:           1. Standard post ERCP care                           2. Observe patient's clinical course.                           3. Trend laboratories                           4. Surgical consultation  regarding cholecystectomy                           5. HOLD HEPARIN FOR 12 hours then resume WITHOUT                            BOLUS Procedure Code(s):        --- Professional ---                           [phone], Endoscopic retrograde                            cholangiopancreatography (ERCP); with removal of  calculi/debris from biliary/pancreatic duct(s)                           [phone], Endoscopic retrograde                            cholangiopancreatography (ERCP); with                            sphincterotomy/papillotomy Diagnosis Code(s):        --- Professional ---                           K80.50, Calculus of bile duct without cholangitis                            or cholecystitis without obstruction                           R10.11, Right upper quadrant pain                           R94.5, Abnormal results of liver function studies                           K83.8, Other specified diseases of biliary tract CPT copyright 2019 American [employer] Association. All rights reserved. The codes documented in this report are preliminary and upon coder review may  be revised to meet current compliance requirements. Docia Chuck. Henrene Pastor, MD 11/05/2021 2:45:11 PM This report has been signed electronically. Number of Addenda: 0

## 2021-11-05 NOTE — Anesthesia Procedure Notes (Signed)
Procedure Name: Intubation Date/Time: 11/05/2021 1:24 PM  Performed by: Kamsiyochukwu Buist, Forest Gleason, CRNAPre-anesthesia Checklist: Patient identified, Emergency Drugs available, Suction available, Patient being monitored and Timeout performed Patient Re-evaluated:Patient Re-evaluated prior to induction Oxygen Delivery Method: Circle system utilized Preoxygenation: Pre-oxygenation with 100% oxygen Induction Type: IV induction Ventilation: Mask ventilation without difficulty Laryngoscope Size: Mac and 3 Grade View: Grade I Tube type: Oral Tube size: 7.0 mm Airway Equipment and Method: Stylet Placement Confirmation: ETT inserted through vocal cords under direct vision, positive ETCO2, CO2 detector and breath sounds checked- equal and bilateral Secured at: 21 cm Tube secured with: Tape Dental Injury: Teeth and Oropharynx as per pre-operative assessment

## 2021-11-05 NOTE — Anesthesia Preprocedure Evaluation (Addendum)
Anesthesia Evaluation  Patient identified by MRN, date of birth, ID band Patient awake    Reviewed: Allergy & Precautions, NPO status , Patient's Chart, lab work & pertinent test results  History of Anesthesia Complications (+) history of anesthetic complications  Airway Mallampati: II  TM Distance: >3 FB Neck ROM: Full    Dental no notable dental hx. (+) Teeth Intact, Dental Advisory Given   Pulmonary Current Smoker and Patient abstained from smoking.,    Pulmonary exam normal breath sounds clear to auscultation       Cardiovascular negative cardio ROS Normal cardiovascular exam Rhythm:Regular Rate:Normal  TTE 2022 1. Left ventricular ejection fraction, by estimation, is 55 to 60%. The  left ventricle has normal function. The left ventricle has no regional  wall motion abnormalities. Indeterminate diastolic filling due to E-A  fusion.  2. Right ventricular systolic function is normal. The right ventricular  size is normal. There is normal pulmonary artery systolic pressure. The  estimated right ventricular systolic pressure is 69.6 mmHg.  3. The mitral valve is grossly normal. Trivial mitral valve  regurgitation. No evidence of mitral stenosis.  4. The aortic valve is grossly normal. Aortic valve regurgitation is not  visualized. No aortic stenosis is present.  5. The inferior vena cava is normal in size with greater than 50%  respiratory variability, suggesting right atrial pressure of 3 mmHg.    Neuro/Psych negative neurological ROS  negative psych ROS   GI/Hepatic Neg liver ROS, GERD  Medicated,  Endo/Other  negative endocrine ROS  Renal/GU negative Renal ROS  negative genitourinary   Musculoskeletal negative musculoskeletal ROS (+)   Abdominal   Peds negative pediatric ROS (+)  Hematology  (+) Blood dyscrasia (on eliquis), anemia ,   Anesthesia Other Findings   Reproductive/Obstetrics negative OB  ROS                            Anesthesia Physical  Anesthesia Plan  ASA: 3  Anesthesia Plan: General   Post-op Pain Management:    Induction: Intravenous  PONV Risk Score and Plan: 3 and Midazolam, Dexamethasone, Ondansetron and Treatment may vary due to age or medical condition  Airway Management Planned: Oral ETT  Additional Equipment: None  Intra-op Plan:   Post-operative Plan: Extubation in OR  Informed Consent: I have reviewed the patients History and Physical, chart, labs and discussed the procedure including the risks, benefits and alternatives for the proposed anesthesia with the patient or authorized representative who has indicated his/her understanding and acceptance.     Dental advisory given  Plan Discussed with: CRNA and Anesthesiologist  Anesthesia Plan Comments: (HPI: Yaritsa Savarino is a 66 y.o. female with medical history significant of DVT/PE on eliquis, GERD, adenocarcinoma of the lung. Presenting with N/V, abdominal pain. Her current flare started about 5 days ago. She noticed RUQ, sharp pain shortly after eating. It was accompanied by some right side chest pain. She had a few episodes of N/V. She tried Barrister's clerk down. Vomiting seemed to make things better. She also tried APAP. When she presented to her chemo infusion session yesterday, it was noted that her LFTs were elevated, so the session was canceled. It was recommended that she come to the ED for w/u. She denies any other aggravating or alleviating factors. )        Anesthesia Quick Evaluation

## 2021-11-05 NOTE — Anesthesia Postprocedure Evaluation (Signed)
Anesthesia Post Note  Patient: Sue Chase  Procedure(s) Performed: ENDOSCOPIC RETROGRADE CHOLANGIOPANCREATOGRAPHY (ERCP) SPHINCTEROTOMY REMOVAL OF STONES     Patient location during evaluation: PACU Anesthesia Type: General Level of consciousness: sedated Pain management: pain level controlled Vital Signs Assessment: post-procedure vital signs reviewed and stable Respiratory status: spontaneous breathing and respiratory function stable Cardiovascular status: stable Postop Assessment: no apparent nausea or vomiting Anesthetic complications: no   No notable events documented.  Last Vitals:  Vitals:   11/05/21 1455 11/05/21 1547  BP:  (!) 141/77  Pulse: 75 65  Resp: 16 16  Temp:  36.7 C  SpO2: 98% 98%    Last Pain:  Vitals:   11/05/21 1547  TempSrc: Oral  PainSc:                  [name] DANIEL

## 2021-11-05 NOTE — Progress Notes (Signed)
PROGRESS NOTE    Sue Chase  LEX:[id number] DOB: 26-Mar-1955 DOA: 11/03/2021 PCP: [name], NP   Brief Narrative:  This 66 years old female with PMH significant for DVT/PE on Eliquis, GERD, adenocarcinoma lung, presented in the ED with complaints  of nausea, vomiting and abdominal pain.  Patient reports her symptoms started 5 days ago she, has developed right upper quadrant abdominal pain shortly after eating,  accompanied by right-sided chest pain.  She has tried Tums and laying down , states vomiting seems to make things better.  When she went to chemo session yesterday it was noted that her LFTs were elevated so the session was canceled and she was recommended to come to the ED.  RUQ ultrasound shows dilated common bile duct approximately 1 cm sludge in gallbladder, choledocholithiasis was demonstrated on previous CT scan within the distal duct.  GI is consulted and general surgery is consulted.  Patient is scheduled for ERCP today.   Assessment & Plan:   Principal Problem:   Biliary obstruction Active Problems:   Tobacco use   Adenocarcinoma of right lung, stage 4 (HCC)   Choledocholithiasis   DVT (deep venous thrombosis) (HCC)   GERD (gastroesophageal reflux disease)   Hypokalemia   Normocytic anemia  Biliary obstruction Choledocholithiasis: Patient presented with abdominal pain, nausea and vomiting RUQ ultrasound shows dilated common bile duct. GI is consulted, started on ciprofloxacin. Continue pain control, IV fluids,  antiemetics. Patient is scheduled for ERCP today. General surgery consulted for possible cholecystectomy. Since patient is on chemotherapy, patient would not be a candidate for cholecystectomy at this time unless it is emergent.   Metastatic lung carcinoma: She follows up with Dr. Julien Nordmann Continue supportive care and outpatient follow-up  DVT/PE on Eliquis: Hold Eliquis for now Continue heparin GTT for now.  GERD: Continue pantoprazole 40  mg daily  Normocytic anemia: Hemoglobin is stable,  no evidence of active bleeding  Hypokalemia: Replaced.  Continue to monitor.  Tobacco abuse: Counseled on quit smoking  DVT prophylaxis: Heparin gtt. Code Status: Full code Family Communication: No family at bedside Disposition Plan:   Status is: Inpatient Remains inpatient appropriate because: Admitted for choledocholithiasis requiring ERCP today General surgery is consulted for possible cholecystectomy. Anticipated discharge home in few days.   Consultants:  General surgery GI  Procedures: CT abdomen, RUQ ultrasound Antimicrobials:  Anti-infectives (From admission, onward)    Start     Dose/Rate Route Frequency Ordered Stop   11/03/21 1800  [MAR Hold]  ciprofloxacin (CIPRO) IVPB 400 mg        (MAR Hold since Fri 11/05/2021 at 1218.Hold Reason: Transfer to a Procedural area)   400 mg 200 mL/hr over 60 Minutes Intravenous 2 times daily 11/03/21 1404 11/08/21 0559       Subjective: Patient was seen and examined at bedside.  Overnight events noted.   Patient reports abdominal pain is improving,  nausea and vomiting has also improved.   She  is scheduled to have ERCP today. She states she is feeling hungry.   Objective: Vitals:   11/04/21 1328 11/04/21 2044 11/05/21 0548 11/05/21 1224  BP: 134/77 139/80 137/82 (!) 143/75  Pulse: 71 64 73 (!) 59  Resp: 17 18 18 15   Temp: 97.7 F (36.5 C) 98.2 F (36.8 C) 98.4 F (36.9 C) 98.1 F (36.7 C)  TempSrc: Oral Oral  Oral  SpO2: 100% 99% 95% 100%  Weight:      Height:        Intake/Output Summary (  Last 24 hours) at 11/05/2021 1344 Last data filed at 11/04/2021 2000 Gross per 24 hour  Intake 1149.19 ml  Output --  Net 1149.19 ml   Filed Weights   11/03/21 1430  Weight: 44.9 kg    Examination:  General exam: Appears comfortable, not in any acute distress.  Deconditioned Respiratory system: CTA bilaterally, respiratory effort normal, RR 16 Cardiovascular  system: S1 & S2 heard, regular rate and rhythm, no murmur. Gastrointestinal system: Abdomen is soft, mildly distended, mildly tender, BS+ Central nervous system: Alert and oriented x 3 . No focal neurological deficits. Extremities: No edema, no cyanosis, no clubbing. Skin: No rashes, lesions or ulcers Psychiatry: Judgement and insight appear normal. Mood & affect appropriate.     Data Reviewed: I have personally reviewed following labs and imaging studies  CBC: Recent Labs  Lab 11/02/21 1030 11/03/21 1351 11/04/21 0405 11/05/21 0444  WBC 7.6 8.8 8.4 6.6  NEUTROABS 4.3  --   --   --   HGB 10.5* 9.6* 9.5* 8.6*  HCT 30.4* 28.8* 28.1* 26.2*  MCV 92.1 95.0 95.3 96.0  PLT 271 263 258 301   Basic Metabolic Panel: Recent Labs  Lab 11/02/21 1030 11/03/21 1351 11/04/21 0405 11/05/21 0444  NA 138 139 139 141  K 3.1* 2.8* 2.6* 3.1*  CL 102 104 108 110  CO2 29 28 25 26   GLUCOSE 115* 109* 97 110*  BUN 12 13 9 8   CREATININE 0.68 0.76 0.64 0.60  CALCIUM 9.4 9.1 8.7* 8.7*  MG  --  1.6*  --   --    GFR: Estimated Creatinine Clearance: 49 mL/min (by C-G formula based on SCr of 0.6 mg/dL). Liver Function Tests: Recent Labs  Lab 11/02/21 1030 11/03/21 1351 11/04/21 0405 11/05/21 0444  AST 53* 63* 78* 59*  ALT 65* 58* 64* 54*  ALKPHOS 562* 489* 473* 488*  BILITOT 1.4* 2.1* 3.6* 1.9*  PROT 7.2 6.4* 6.3* 6.2*  ALBUMIN 3.6 2.9* 2.7* 2.6*   Recent Labs  Lab 11/03/21 1351  LIPASE 88*   No results for input(s): "AMMONIA" in the last 168 hours. Coagulation Profile: No results for input(s): "INR", "PROTIME" in the last 168 hours. Cardiac Enzymes: No results for input(s): "CKTOTAL", "CKMB", "CKMBINDEX", "TROPONINI" in the last 168 hours. BNP (last 3 results) No results for input(s): "PROBNP" in the last 8760 hours. HbA1C: No results for input(s): "HGBA1C" in the last 72 hours. CBG: No results for input(s): "GLUCAP" in the last 168 hours. Lipid Profile: No results for  input(s): "CHOL", "HDL", "LDLCALC", "TRIG", "CHOLHDL", "LDLDIRECT" in the last 72 hours. Thyroid Function Tests: No results for input(s): "TSH", "T4TOTAL", "FREET4", "T3FREE", "THYROIDAB" in the last 72 hours. Anemia Panel: No results for input(s): "VITAMINB12", "FOLATE", "FERRITIN", "TIBC", "IRON", "RETICCTPCT" in the last 72 hours. Sepsis Labs: No results for input(s): "PROCALCITON", "LATICACIDVEN" in the last 168 hours.  No results found for this or any previous visit (from the past 240 hour(s)).   Radiology Studies: No results found.  Scheduled Meds:  [MAR Hold] Chlorhexidine Gluconate Cloth  6 each Topical Daily   [MAR Hold] folic acid  1 mg Oral Daily   [MAR Hold] magnesium oxide  400 mg Oral Daily   phenylephrine       [MAR Hold] potassium chloride  40 mEq Oral Once   [MAR Hold] sodium chloride flush  10-40 mL Intracatheter Q12H   Continuous Infusions:  sodium chloride 75 mL/hr at 11/05/21 0138   [MAR Hold] ciprofloxacin 400 mg (11/05/21 0623)  LOS: 2 days    Time spent: 35 mins    Baruch Lewers, MD [facility]   If 7PM-7AM, please contact night-coverage

## 2021-11-06 DIAGNOSIS — K831 Obstruction of bile duct: Secondary | ICD-10-CM | POA: Diagnosis not present

## 2021-11-06 LAB — PHOSPHORUS: Phosphorus: 4 mg/dL (ref 2.5–4.6)

## 2021-11-06 LAB — MAGNESIUM: Magnesium: 1.4 mg/dL — ABNORMAL LOW (ref 1.7–2.4)

## 2021-11-06 LAB — CBC
HCT: 25.6 % — ABNORMAL LOW (ref 36.0–46.0)
Hemoglobin: 8.5 g/dL — ABNORMAL LOW (ref 12.0–15.0)
MCH: 32.4 pg (ref 26.0–34.0)
MCHC: 33.2 g/dL (ref 30.0–36.0)
MCV: 97.7 fL (ref 80.0–100.0)
Platelets: 240 10*3/uL (ref 150–400)
RBC: 2.62 MIL/uL — ABNORMAL LOW (ref 3.87–5.11)
RDW: 18.7 % — ABNORMAL HIGH (ref 11.5–15.5)
WBC: 11.8 10*3/uL — ABNORMAL HIGH (ref 4.0–10.5)
nRBC: 0 % (ref 0.0–0.2)

## 2021-11-06 LAB — COMPREHENSIVE METABOLIC PANEL
ALT: 46 U/L — ABNORMAL HIGH (ref 0–44)
AST: 43 U/L — ABNORMAL HIGH (ref 15–41)
Albumin: 2.5 g/dL — ABNORMAL LOW (ref 3.5–5.0)
Alkaline Phosphatase: 444 U/L — ABNORMAL HIGH (ref 38–126)
Anion gap: 5 (ref 5–15)
BUN: 10 mg/dL (ref 8–23)
CO2: 23 mmol/L (ref 22–32)
Calcium: 8.8 mg/dL — ABNORMAL LOW (ref 8.9–10.3)
Chloride: 113 mmol/L — ABNORMAL HIGH (ref 98–111)
Creatinine, Ser: 0.8 mg/dL (ref 0.44–1.00)
GFR, Estimated: >60 mL/min (ref >60)
Glucose, Bld: 106 mg/dL — ABNORMAL HIGH (ref 70–99)
Potassium: 4.1 mmol/L (ref 3.5–5.1)
Sodium: 141 mmol/L (ref 135–145)
Total Bilirubin: 1.6 mg/dL — ABNORMAL HIGH (ref 0.3–1.2)
Total Protein: 5.8 g/dL — ABNORMAL LOW (ref 6.5–8.1)

## 2021-11-06 MED ORDER — HEPARIN SOD (PORK) LOCK FLUSH 100 UNIT/ML IV SOLN
500.0000 [IU] | INTRAVENOUS | Status: AC | PRN
  Administered 2021-11-06: 500 [IU]
  Filled 2021-11-06: qty 5

## 2021-11-06 MED ORDER — APIXABAN 5 MG PO TABS
5.0000 mg | ORAL_TABLET | Freq: Two times a day (BID) | ORAL | Status: DC
  Administered 2021-11-06: 5 mg via ORAL
  Filled 2021-11-06: qty 1

## 2021-11-06 MED ORDER — MAGNESIUM SULFATE 2 GM/50ML IV SOLN
2.0000 g | Freq: Once | INTRAVENOUS | Status: AC
  Administered 2021-11-06: 2 g via INTRAVENOUS
  Filled 2021-11-06: qty 50

## 2021-11-06 MED ORDER — CIPROFLOXACIN HCL 500 MG PO TABS: 500.0000 mg | ORAL_TABLET | Freq: Two times a day (BID) | ORAL | 0 refills | Status: AC

## 2021-11-06 NOTE — Progress Notes (Addendum)
Progress Note   Subjective  Chief Complaint: Choledocholithiasis  Status post ERCP 11/05/2021  Patient doing well this morning, tolerating her diet and no further complaints.  Tells me that surgery team does not plan on doing a cholecystectomy.  She would like to go home.    Objective   Vital signs in last 24 hours: Temp:  [97.8 F (36.6 C)-98.1 F (36.7 C)] 98 F (36.7 C) (09/16 0539) Pulse Rate:  [59-84] 67 (09/16 0614) Resp:  [13-22] 17 (09/16 0614) BP: (106-164)/(60-82) 124/71 (09/16 0614) SpO2:  [98 %-100 %] 100 % (09/16 0614) Last BM Date : 11/05/21 General:    white female in NAD Heart:  Regular rate and rhythm; no murmurs Lungs: Respirations even and unlabored, lungs CTA bilaterally Abdomen:  Soft, nontender and nondistended. Normal bowel sounds. Psych:  Cooperative. Normal mood and affect.  Intake/Output from previous day: 09/15 0701 - 09/16 0700 In: 2608.3 [I.V.:2208.3; IV Piggyback:400] Out: 10 [Blood:10]   Lab Results: Recent Labs    11/04/21 0405 11/05/21 0444 11/06/21 0446  WBC 8.4 6.6 11.8*  HGB 9.5* 8.6* 8.5*  HCT 28.1* 26.2* 25.6*  PLT 258 214 240   BMET Recent Labs    11/04/21 0405 11/05/21 0444 11/06/21 0446  NA 139 141 141  K 2.6* 3.1* 4.1  CL 108 110 113*  CO2 25 26 23   GLUCOSE 97 110* 106*  BUN 9 8 10   CREATININE 0.64 0.60 0.80  CALCIUM 8.7* 8.7* 8.8*   LFT Recent Labs    11/06/21 0446  PROT 5.8*  ALBUMIN 2.5*  AST 43*  ALT 46*  ALKPHOS 444*  BILITOT 1.6*    Studies/Results: DG ERCP  Result Date: 11/05/2021 CLINICAL DATA:  66 year old female with a history choledocholithiasis EXAM: ERCP TECHNIQUE: Multiple spot images obtained with the fluoroscopic device and submitted for interpretation post-procedure. FLUOROSCOPY: Radiation Exposure Index (as provided by the fluoroscopic device): 102 mGy Kerma COMPARISON:  CT 10/03/2021 FINDINGS: Limited intraoperative fluoroscopic spot images during ERCP. Initial image  demonstrates the endoscope projecting over the upper abdomen. Subsequently there is placement of a safety wire within the common bile duct and retrograde infusion of contrast. Deployment of a retrieval balloon. Partial opacification of duodenal diverticulum adjacent to the ampulla. IMPRESSION: Limited images during ERCP demonstrates treatment of choledocholithiasis with deployment of a retrieval balloon. Partial opacification of duodenal diverticulum. Please refer to the dictated operative report for full details of intraoperative findings and procedure. Electronically Signed   By: Corrie Mckusick D.O.   On: 11/05/2021 15:25       Assessment / Plan:   Assessment: 1.  Choledocholithiasis: Intermittent right upper quadrant pain over the past year with persistently elevated LFTs, associated symptoms of nausea and vomiting during episodes, successful ERCP yesterday with removal of stones and sphincterotomy 2.  Newly diagnosed lung cancer on chemotherapy and immunotherapy 3.  History of DVT/PE: On Eliquis, last dose 11/03/2018 3 AM, on heparin drip peer  Plan: 1.  Patient will have repeat labs, CBC and LFTs in a week at our outpatient clinic.  She will be called in regards to reminder for this. 2.  Continue Ciprofloxacin 500 mg twice daily x3 more days as an outpatient 3.  Can have regular diet 4.  Patient can be discharged today from a GI standpoint. 5.  Okay to resume anticoagulation  Thank you for your kind consultation.  We will sign off.   LOS: 3 days   Levin Erp  11/06/2021, 10:12 AM  GI ATTENDING  Interval history and data reviewed.  Patient personally seen and examined.  The patient is sitting comfortably in her chair.  Feeling well.  Results of procedure again reviewed.  Agree with interval progress note as outlined above.  Surgery is not planning cholecystectomy at this time.  Patient is okay for discharge.  We will send her out on a few days of ciprofloxacin.  I will trend  liver tests as outpatient.  I will see her in follow-up.  My nurse will arrange for laboratories and GI office follow-up.  Otherwise, medical care with primary care physician and oncology team.  Okay to resume anticoagulation.  We will sign off.  Docia Chuck. Geri Seminole., M.D. [facility] Division of Gastroenterology

## 2021-11-06 NOTE — Progress Notes (Signed)
Sue Chase to be D/C'd home per MD order. Discussed with the patient and all questions fully answered.  Skin clean, dry and intact without evidence of skin break down, no evidence of skin tears noted.  IV catheter discontinued intact. Site without signs and symptoms of complications. Dressing and pressure applied. Port-a-cath deaccessed by IV team. An After Visit Summary was printed and given to the patient.  Patient escorted via [place], and D/C home via private auto.  [name]  11/06/2021 11:32 AM

## 2021-11-06 NOTE — Discharge Summary (Signed)
Physician Discharge Summary  Sue Chase WRU:[id number] DOB: 1955/07/31 DOA: 11/03/2021  PCP: [name], NP  Admit date: 11/03/2021  Discharge date: 11/06/2021  Admitted From: Home.  Disposition: Home.  Recommendations for Outpatient Follow-up:  Follow up with PCP in 1-2 weeks. Please obtain BMP/CBC in one week. Advised to follow-up with Gastroenterology in 4 weeks. Advised to take ciprofloxacin 500 mg twice daily for 3 more days. Advised to follow-up with Oncology for chemotherapy for lung cancer.  Home Health: None Equipment/Devices: None  Discharge Condition: Stable CODE STATUS:Full code Diet recommendation: Heart Healthy   Brief [facility] Course: This 66 years old female with PMH significant for DVT/PE on Eliquis, GERD, adenocarcinoma lung, presented in the ED with complaints  of nausea, vomiting and abdominal pain.  Patient reports her symptoms started 5 days ago, She has developed right upper quadrant abdominal pain shortly after eating,  accompanied by right-sided chest pain.  She has tried Tums and laying down , states vomiting seems to make things better.  When she went to chemo session yesterday it was noted that her LFTs were elevated so the session was canceled and she was recommended to come to the ED.  RUQ ultrasound shows dilated common bile duct approximately 1 cm sludge in gallbladder, choledocholithiasis was demonstrated on previous CT scan within the distal duct.  Patient was admitted for further evaluation.  GI and general surgery was consulted.  Patient was continued on heparin GTT, Eliquis was kept on hold.  Patient underwent successful ERCP with removal of the stone and sphincterotomy.  Tolerated well.  LFTs has improved.  Patient reported resolution of symptoms.  General surgery not planning cholecystectomy at this time.  Patient feels better and wants to be discharged.  Heparin discontinued, resumed on Eliquis.  Patient is being discharged on  ciprofloxacin for 3 more days, advised follow-up with GI and follow-up with oncology for continuation of chemotherapy.  Discharge Diagnoses:  Principal Problem:   Biliary obstruction Active Problems:   Tobacco use   Adenocarcinoma of right lung, stage 4 (HCC)   Choledocholithiasis   DVT (deep venous thrombosis) (HCC)   GERD (gastroesophageal reflux disease)   Hypokalemia   Normocytic anemia  Biliary obstruction Choledocholithiasis: Patient presented with abdominal pain, nausea and vomiting RUQ ultrasound shows dilated common bile duct. GI is consulted, started on ciprofloxacin. Continue pain control, IV fluids,  antiemetics. Patient scheduled for ERCP on 9/15 General surgery consulted for possible cholecystectomy. Since patient is on chemotherapy, patient would not be a candidate for cholecystectomy at this time unless it is emergent. Patient underwent successful ERCP with removal of the stone and sphincterotomy. Patient reports significantly improved symptoms resolved, GI signed off recommended patient can be discharged on Cipro for 3 more days. Patient will follow-up with GI in 4 weeks.     Metastatic lung carcinoma: She follows up with Dr. Julien Nordmann, continue outpatient follow-up Continue supportive care and outpatient follow-up   DVT/PE on Eliquis: Resume Eliquis. Discontinue heparin.   GERD: Continue pantoprazole 40 mg daily   Normocytic anemia: Hemoglobin is stable,  no evidence of active bleeding   Hypokalemia: Replaced.  Continue to monitor.   Tobacco abuse: Counseled on quit smoking  Discharge Instructions  Discharge Instructions     Call MD for:  difficulty breathing, headache or visual disturbances   Complete by: As directed    Call MD for:  persistant dizziness or light-headedness   Complete by: As directed    Call MD for:  persistant nausea and  vomiting   Complete by: As directed    Diet - low sodium heart healthy   Complete by: As directed     Diet Carb Modified   Complete by: As directed    Discharge instructions   Complete by: As directed    Advised to follow-up with primary care physician in 1 week. Advised to follow-up with gastroenterology in 4 weeks. Advised to take ciprofloxacin 500 mg twice daily for 3 more days. Advised to follow-up with pulmonology for chemotherapy for lung cancer.   Increase activity slowly   Complete by: As directed       Allergies as of 11/06/2021       Reactions   Codeine Itching        Medication List     STOP taking these medications    mirtazapine 15 MG tablet Commonly known as: Remeron       TAKE these medications    albuterol 108 (90 Base) MCG/ACT inhaler Commonly known as: VENTOLIN HFA Inhale 2 puffs into the lungs every 6 (six) hours as needed for wheezing or shortness of breath.   apixaban 5 MG Tabs tablet Commonly known as: ELIQUIS Take 1 tablet (5 mg total) by mouth 2 (two) times daily.   ciprofloxacin 500 MG tablet Commonly known as: Cipro Take 1 tablet (500 mg total) by mouth 2 (two) times daily for 3 days.   folic acid 1 MG tablet Commonly known as: FOLVITE Take 1 tablet (1 mg total) by mouth daily.   lidocaine-prilocaine cream Commonly known as: EMLA Apply to the Port-A-Cath site 30 minutes before chemotherapy What changed:  how much to take how to take this when to take this reasons to take this   magnesium oxide 400 (240 Mg) MG tablet Commonly known as: MAG-OX Take 400 mg by mouth daily.   omeprazole 20 MG capsule Commonly known as: PRILOSEC Take 1 capsule (20 mg total) by mouth daily. What changed:  when to take this reasons to take this   potassium chloride SA 20 MEQ tablet Commonly known as: KLOR-CON M Take 1 tablet (20 mEq total) by mouth daily.   prochlorperazine 10 MG tablet Commonly known as: COMPAZINE Take 1 tablet (10 mg total) by mouth every 6 (six) hours as needed for nausea or vomiting.         Follow-up Information      Nafziger, Tommi Rumps, NP Follow up in 1 week(s).   Specialty: Family Medicine Contact information: [address] [place] [address] [phone]         Irene Shipper, MD Follow up in 1 month(s).   Specialty: Gastroenterology Contact information: [address] [address] [phone]                Allergies  Allergen Reactions   Codeine Itching    Consultations: Gastroenterology General surgery   Procedures/Studies: DG ERCP  Result Date: 11/05/2021 CLINICAL DATA:  66 year old female with a history choledocholithiasis EXAM: ERCP TECHNIQUE: Multiple spot images obtained with the fluoroscopic device and submitted for interpretation post-procedure. FLUOROSCOPY: Radiation Exposure Index (as provided by the fluoroscopic device): 102 mGy Kerma COMPARISON:  CT 10/03/2021 FINDINGS: Limited intraoperative fluoroscopic spot images during ERCP. Initial image demonstrates the endoscope projecting over the upper abdomen. Subsequently there is placement of a safety wire within the common bile duct and retrograde infusion of contrast. Deployment of a retrieval balloon. Partial opacification of duodenal diverticulum adjacent to the ampulla. IMPRESSION: Limited images during ERCP demonstrates treatment  of choledocholithiasis with deployment of a retrieval balloon. Partial opacification of duodenal diverticulum. Please refer to the dictated operative report for full details of intraoperative findings and procedure. Electronically Signed   By: Corrie Mckusick D.O.   On: 11/05/2021 15:25   US Abdomen Limited RUQ (LIVER/GB)  Result Date: 11/03/2021 CLINICAL DATA:  Alkaline phosphatase elevation EXAM: ULTRASOUND ABDOMEN LIMITED RIGHT UPPER QUADRANT COMPARISON:  CT 10/03/2021 FINDINGS: Gallbladder: Small amount of sludge in the gallbladder. No stones. No wall thickening or surrounding fluid. No Murphy sign. Common bile duct: Diameter: 9.9 mm.  No obstructing lesion  identified. Liver: Mild intrahepatic ductal prominence. Mildly heterogeneous echotexture that could go along with mild steatosis. No evidence of focal mass. Portal vein is patent on color Doppler imaging with normal direction of blood flow towards the liver. Other: None. IMPRESSION: Mild intrahepatic biliary ductal prominence. Dilated common bile duct, approximally 1 cm. Small amount of sludge in the gallbladder. Choledocholithiasis was demonstrated at previous CT within the distal duct. The distal duct is not well seen on today's ultrasound. Electronically Signed   By: Nelson Chimes M.D.   On: 11/03/2021 12:41   VAS [place] LOWER EXTREMITY VENOUS (DVT)  Result Date: 10/14/2021  Lower Venous DVT Study Patient Name:  [facility]  Date of Exam:   10/14/2021 Medical Rec #: [id number]            Accession #:    [id number] Date of Birth: 1955/09/15             Patient Gender: F Patient Age:   74 years Exam Location:  [facility] Procedure:      VAS [place] LOWER EXTREMITY VENOUS (DVT) Referring Phys: Roosevelt Locks --------------------------------------------------------------------------------  Indications: Swelling.  Risk Factors: Cancer. Limitations: Poor ultrasound/tissue interface. Comparison Study: 02/18/2021 - Positive examination for acute deep venous                   thrombosis involving the                   right femoral and popliteal veins with expansile                   noncompressible                   clot demonstrated throughout. Performing Technologist: Oliver Hum RVT  Examination Guidelines: A complete evaluation includes B-mode imaging, spectral Doppler, color Doppler, and power Doppler as needed of all accessible portions of each vessel. Bilateral testing is considered an integral part of a complete examination. Limited examinations for reoccurring indications may be performed as noted. The reflux portion of the exam is performed with the patient in reverse Trendelenburg.   +---------+---------------+---------+-----------+----------+-----------------+ RIGHT    CompressibilityPhasicitySpontaneityPropertiesThrombus Aging    +---------+---------------+---------+-----------+----------+-----------------+ CFV      Full           Yes      Yes                                    +---------+---------------+---------+-----------+----------+-----------------+ SFJ      Full                                                           +---------+---------------+---------+-----------+----------+-----------------+  FV Prox  Full                                                           +---------+---------------+---------+-----------+----------+-----------------+ FV Mid   Full                                                           +---------+---------------+---------+-----------+----------+-----------------+ FV DistalFull                                                           +---------+---------------+---------+-----------+----------+-----------------+ PFV      Full                                                           +---------+---------------+---------+-----------+----------+-----------------+ POP      Partial        Yes      Yes                  Chronic           +---------+---------------+---------+-----------+----------+-----------------+ PTV      Full                                                           +---------+---------------+---------+-----------+----------+-----------------+ PERO     Partial                                      Age Indeterminate +---------+---------------+---------+-----------+----------+-----------------+ Gastroc  None                                         Age Indeterminate +---------+---------------+---------+-----------+----------+-----------------+ Thrombus noted in the popliteal vein appears to be in a duplicate vein.   +---------+---------------+---------+-----------+----------+--------------+ LEFT     CompressibilityPhasicitySpontaneityPropertiesThrombus Aging +---------+---------------+---------+-----------+----------+--------------+ CFV      Full           Yes      Yes                                 +---------+---------------+---------+-----------+----------+--------------+ SFJ      Full                                                        +---------+---------------+---------+-----------+----------+--------------+  FV Prox  Full                                                        +---------+---------------+---------+-----------+----------+--------------+ FV Mid   Full                                                        +---------+---------------+---------+-----------+----------+--------------+ FV DistalFull                                                        +---------+---------------+---------+-----------+----------+--------------+ PFV      Full                                                        +---------+---------------+---------+-----------+----------+--------------+ POP      Full           Yes      Yes                                 +---------+---------------+---------+-----------+----------+--------------+ PTV      Full                                                        +---------+---------------+---------+-----------+----------+--------------+ PERO     Full                                                        +---------+---------------+---------+-----------+----------+--------------+     Summary: RIGHT: - Findings consistent with age indeterminate deep vein thrombosis involving the right gastrocnemius veins, and right peroneal veins. - Findings consistent with chronic deep vein thrombosis involving the right popliteal vein.  LEFT: - There is no evidence of deep vein thrombosis in the lower extremity.  - No cystic structure found  in the popliteal fossa.  *See table(s) above for measurements and observations. Electronically signed by Monica Martinez MD on 10/14/2021 at 1:06:36 PM.    Final     ERCP   Subjective: Patient was seen and examined at bedside.  Overnight events noted.   Patient reports feeling much improved and she wants to be discharged.   She stated that abdominal pain has resolved.  Discharge Exam: Vitals:   11/05/21 2107 11/06/21 0614  BP: 106/68 124/71  Pulse: 65 67  Resp: 17 17  Temp: 97.8 F (36.6 C) 98 F (36.7 C)  SpO2: 99% 100%   Vitals:   11/05/21 1455 11/05/21 1547 11/05/21 2107 11/06/21 8299  BP:  (!) 141/77 106/68 124/71  Pulse: 75 65 65 67  Resp: 16 16 17 17   Temp:  98.1 F (36.7 C) 97.8 F (36.6 C) 98 F (36.7 C)  TempSrc:  Oral Oral   SpO2: 98% 98% 99% 100%  Weight:      Height:        General: Pt is alert, awake, not in acute distress Cardiovascular: RRR, S1/S2 +, no rubs, no gallops Respiratory: CTA bilaterally, no wheezing, no rhonchi Abdominal: Soft, NT, ND, bowel sounds + Extremities: no edema, no cyanosis    The results of significant diagnostics from this hospitalization (including imaging, microbiology, ancillary and laboratory) are listed below for reference.     Microbiology: No results found for this or any previous visit (from the past 240 hour(s)).   Labs: BNP (last 3 results) Recent Labs    02/18/21 2044  BNP 854.6*   Basic Metabolic Panel: Recent Labs  Lab 11/02/21 1030 11/03/21 1351 11/04/21 0405 11/05/21 0444 11/06/21 0446  NA 138 139 139 141 141  K 3.1* 2.8* 2.6* 3.1* 4.1  CL 102 104 108 110 113*  CO2 29 28 25 26 23   GLUCOSE 115* 109* 97 110* 106*  BUN 12 13 9 8 10   CREATININE 0.68 0.76 0.64 0.60 0.80  CALCIUM 9.4 9.1 8.7* 8.7* 8.8*  MG  --  1.6*  --   --  1.4*  PHOS  --   --   --   --  4.0   Liver Function Tests: Recent Labs  Lab 11/02/21 1030 11/03/21 1351 11/04/21 0405 11/05/21 0444 11/06/21 0446  AST 53* 63*  78* 59* 43*  ALT 65* 58* 64* 54* 46*  ALKPHOS 562* 489* 473* 488* 444*  BILITOT 1.4* 2.1* 3.6* 1.9* 1.6*  PROT 7.2 6.4* 6.3* 6.2* 5.8*  ALBUMIN 3.6 2.9* 2.7* 2.6* 2.5*   Recent Labs  Lab 11/03/21 1351  LIPASE 88*   No results for input(s): "AMMONIA" in the last 168 hours. CBC: Recent Labs  Lab 11/02/21 1030 11/03/21 1351 11/04/21 0405 11/05/21 0444 11/06/21 0446  WBC 7.6 8.8 8.4 6.6 11.8*  NEUTROABS 4.3  --   --   --   --   HGB 10.5* 9.6* 9.5* 8.6* 8.5*  HCT 30.4* 28.8* 28.1* 26.2* 25.6*  MCV 92.1 95.0 95.3 96.0 97.7  PLT 271 263 258 214 240   Cardiac Enzymes: No results for input(s): "CKTOTAL", "CKMB", "CKMBINDEX", "TROPONINI" in the last 168 hours. BNP: Invalid input(s): "POCBNP" CBG: No results for input(s): "GLUCAP" in the last 168 hours. D-Dimer No results for input(s): "DDIMER" in the last 72 hours. Hgb A1c No results for input(s): "HGBA1C" in the last 72 hours. Lipid Profile No results for input(s): "CHOL", "HDL", "LDLCALC", "TRIG", "CHOLHDL", "LDLDIRECT" in the last 72 hours. Thyroid function studies No results for input(s): "TSH", "T4TOTAL", "T3FREE", "THYROIDAB" in the last 72 hours.  Invalid input(s): "FREET3" Anemia work up No results for input(s): "VITAMINB12", "FOLATE", "FERRITIN", "TIBC", "IRON", "RETICCTPCT" in the last 72 hours. Urinalysis    Component Value Date/Time   COLORURINE AMBER (A) 11/03/2021 1246   APPEARANCEUR CLEAR 11/03/2021 1246   LABSPEC 1.017 11/03/2021 1246   PHURINE 5.0 11/03/2021 1246   GLUCOSEU NEGATIVE 11/03/2021 1246   HGBUR NEGATIVE 11/03/2021 1246   BILIRUBINUR NEGATIVE 11/03/2021 1246   BILIRUBINUR neg 10/19/2021 1106   KETONESUR NEGATIVE 11/03/2021 1246   PROTEINUR NEGATIVE 11/03/2021 1246   UROBILINOGEN 0.2 10/19/2021 1106   NITRITE NEGATIVE 11/03/2021 1246   LEUKOCYTESUR  NEGATIVE 11/03/2021 1246   Sepsis Labs Recent Labs  Lab 11/03/21 1351 11/04/21 0405 11/05/21 0444 11/06/21 0446  WBC 8.8 8.4 6.6 11.8*    Microbiology No results found for this or any previous visit (from the past 240 hour(s)).   Time coordinating discharge: Over 30 minutes  SIGNED:   Shawna Clamp, MD  [facility] 11/06/2021, 2:34 PM Pager   If 7PM-7AM, please contact night-coverage

## 2021-11-06 NOTE — Discharge Instructions (Signed)
Advised to follow-up with primary care physician in 1 week. Advised to follow-up with gastroenterology in 4 weeks. Advised to take ciprofloxacin 500 mg twice daily for 3 more days. Advised to follow-up with pulmonology for chemotherapy for lung cancer.

## 2021-11-07 ENCOUNTER — Encounter (HOSPITAL_COMMUNITY): Payer: Self-pay | Admitting: Internal Medicine

## 2021-11-08 ENCOUNTER — Telehealth: Payer: Self-pay

## 2021-11-08 ENCOUNTER — Other Ambulatory Visit: Payer: Self-pay

## 2021-11-08 DIAGNOSIS — K831 Obstruction of bile duct: Secondary | ICD-10-CM

## 2021-11-08 NOTE — Telephone Encounter (Signed)
Levin Erp, [place]  [place], [place], [place] Can you place order for repeat LFTs 11/15/2021 for the patient for history of choledocholithiasis and recent ERCP.  Please call and let the patient know.   Thanks, Christene Slates, Lavone Nian, PA  [place], [place], [place] Lets add a CBC to that too in a week with her LFTs.   Thanks, JLL

## 2021-11-08 NOTE — Telephone Encounter (Signed)
Lab orders already in epic.   Attempted to reach patient. Her vm is not set up. Will attempt to reach patient at a different time.

## 2021-11-08 NOTE — Telephone Encounter (Signed)
Transition Care Management Follow-up Telephone Call Date of discharge and from where: [place] 11-06-21 Dx: biliary obstruction  How have you been since you were released from the hospital? Feeling some better  Any questions or concerns? No- pt is wondering if appt is not necessary to please call and cancel the appt or make it televisit   Items Reviewed: Did the pt receive and understand the discharge instructions provided? Yes  Medications obtained and verified? Yes  Other? No  Any new allergies since your discharge? No  Dietary orders reviewed? Yes Do you have support at home? Yes   Home Care and Equipment/Supplies: Were home health services ordered? no If so, what is the name of the agency? na  Has the agency set up a time to come to the patient's home? not applicable Were any new equipment or medical supplies ordered?  No What is the name of the medical supply agency? na Were you able to get the supplies/equipment? not applicable Do you have any questions related to the use of the equipment or supplies? No  Functional Questionnaire: (I = Independent and D = Dependent) ADLs: I  Bathing/Dressing- I  Meal Prep- I  Eating- I  Maintaining continence- I  Transferring/Ambulation- I  Managing Meds- I  Follow up appointments reviewed:  PCP Hospital f/u appt confirmed? Yes  Scheduled to see [name], NP on 11-11-21 @ [facility] f/u appt confirmed? Yes  Scheduled to see Dr Henrene Pastor  on 11-17-21 @ 340pm. Are transportation arrangements needed? No  If their condition worsens, is the pt aware to call PCP or go to the Emergency Dept.? Yes Was the patient provided with contact information for the PCP's office or ED? Yes Was to pt encouraged to call back with questions or concerns? Yes

## 2021-11-08 NOTE — Telephone Encounter (Signed)
-----   Message from Levin Erp, [place] sent at 11/06/2021 10:11 AM EDT ----- Regarding: Repeat LFTs Can you place order for repeat LFTs 11/15/2021 for the patient for history of choledocholithiasis and recent ERCP.  Please call and let the patient know.  Thanks, JL L

## 2021-11-09 ENCOUNTER — Other Ambulatory Visit: Payer: BC Managed Care – PPO

## 2021-11-09 ENCOUNTER — Other Ambulatory Visit: Payer: Self-pay | Admitting: Adult Health

## 2021-11-10 NOTE — Telephone Encounter (Signed)
2nd attempt to reach patient. Her vm is not set up. I am unable to leave a message at this time. Will attempt to reach patient again at a different time.  MyChart message sent to patient as well.

## 2021-11-10 NOTE — Telephone Encounter (Signed)
Patient reviewed and responded to MyChart message. Last read by Otila Kluver at  1:56 PM on 11/10/2021.

## 2021-11-11 ENCOUNTER — Ambulatory Visit (INDEPENDENT_AMBULATORY_CARE_PROVIDER_SITE_OTHER): Payer: Medicare Other | Admitting: Adult Health

## 2021-11-11 ENCOUNTER — Encounter: Payer: Self-pay | Admitting: Adult Health

## 2021-11-11 VITALS — BP 110/70 | HR 77 | Temp 97.5°F | Ht 63.0 in | Wt 99.6 lb

## 2021-11-11 DIAGNOSIS — Z86718 Personal history of other venous thrombosis and embolism: Secondary | ICD-10-CM

## 2021-11-11 DIAGNOSIS — K8050 Calculus of bile duct without cholangitis or cholecystitis without obstruction: Secondary | ICD-10-CM | POA: Diagnosis not present

## 2021-11-11 DIAGNOSIS — R600 Localized edema: Secondary | ICD-10-CM

## 2021-11-11 DIAGNOSIS — Z720 Tobacco use: Secondary | ICD-10-CM

## 2021-11-11 DIAGNOSIS — K219 Gastro-esophageal reflux disease without esophagitis: Secondary | ICD-10-CM

## 2021-11-11 DIAGNOSIS — E876 Hypokalemia: Secondary | ICD-10-CM

## 2021-11-11 DIAGNOSIS — K831 Obstruction of bile duct: Secondary | ICD-10-CM

## 2021-11-11 DIAGNOSIS — C3491 Malignant neoplasm of unspecified part of right bronchus or lung: Secondary | ICD-10-CM

## 2021-11-11 MED ORDER — FUROSEMIDE 20 MG PO TABS: 20.0000 mg | ORAL_TABLET | Freq: Every day | ORAL | 0 refills | Status: DC

## 2021-11-11 NOTE — Patient Instructions (Signed)
Health Maintenance Due  Topic Date Due   Pneumonia Vaccine 89+ Years old (1 - PCV) Never done   Hepatitis C Screening  Never done   Zoster Vaccines- Shingrix (1 of 2) Never done   MAMMOGRAM  09/16/2011   COVID-19 Vaccine (3 - Pfizer risk series) 07/03/2019   INFLUENZA VACCINE  09/21/2021   COLONOSCOPY (Pts 45-41yrs Insurance coverage will need to be confirmed)  09/21/2021      Row Labels 02/26/2021    1:03 PM 12/11/2019    2:06 PM 06/29/2017    9:25 AM  Depression screen PHQ 2/9   Section Header. No data exists in this row.     Decreased Interest   1 0 0  Down, Depressed, Hopeless   2 0 0  PHQ - 2 Score   3 0 0  Altered sleeping   1    Tired, decreased energy   1    Change in appetite   1    Feeling bad or failure about yourself    0    Trouble concentrating   1    Moving slowly or fidgety/restless   0    Suicidal thoughts   0    PHQ-9 Score   7    Difficult doing work/chores   Not difficult at all

## 2021-11-11 NOTE — Progress Notes (Addendum)
Subjective:    Patient ID: Sue Chase, female    DOB: [birth year], 66 y.o.   MRN: [id number]  HPI  66 year old female who  has a past medical history of Colon polyps, Complication of anesthesia, Hyperlipidemia, Rheumatic fever, Seasonal allergies, and UTI (urinary tract infection).  She presents to the office today for TCM visit   Admit Date 11/03/2021 Discharge Date 11/06/2021  She presented to the emergency room with nausea, vomiting, abdominal pain.  Her symptoms started 5 days prior.  She developed right upper quadrant abdominal pain shortly after eating accompanied by right-sided chest pain.  She tried Tums and laying down while at home.  No vomiting seem to make things better.  The day before she went to the emergency room she went to her chemo session it was noted that her LFTs were elevated so the session was canceled and she recommended to follow-up in the emergency room.  While in the ER ultrasound showed dilated common bile duct approximately 1 cm sludge in gallbladder. Choledocholithiasis  was demonstrated on CT.  She was admitted for further evaluation.  GI and general surgery was consulted.  Underwent successful ERCP with removal of the stone and sphincterectomy.  Her LFTs improved.  General surgery not planning on cholecystectomy at this time.  Hospital Course  Biliary Obstruction/Choledocholithiasis  -See above  Metastatic lung carcinoma -Has follow-up with oncology to continue chemo treatments  DVT/PE -Heparin was discontinued upon discharge and she was resumed on Eliquis  GERD -Continued on Protonix 40 mg daily  Normocytic anemia -Hemoglobin is stable.  No evidence of active bleeding  Hypokalemia -Placed during admission  Tobacco Abuse - Encouraged to quit smoking   Today she reports that she has been doing well overall since her hospital admission.  She not had any fevers or chills since being discharged.  Has not experienced any nausea, vomiting, or  diarrhea.  Biggest complaint is that of worsening bilateral lower extremity edema since being discharged.  She is back to taking her Eliquis and was on a heparin drip while in the hospital.  She denies any redness, warmth, or tenderness to her calves.  She has noticed that when she gets up in the morning the swelling is improved but is worse throughout the day.  Swelling goes from mid shin to feet.  She has a follow-up with gastroenterology next week for blood work and this test medical appointment.  Review of Systems See HPI   Past Medical History:  Diagnosis Date   Colon polyps    Complication of anesthesia    tolerated propofol but other meds made her emotional / cry/ difficulty breathing/ panic   Hyperlipidemia    Rheumatic fever    Seasonal allergies    UTI (urinary tract infection)     Social History   Socioeconomic History   Marital status: Widowed    Spouse name: Not on file   Number of children: Not on file   Years of education: Not on file   Highest education level: Some college, no degree  Occupational History   Not on file  Tobacco Use   Smoking status: Some Days    Packs/day: 1.00    Years: 40.00    Total pack years: 40.00    Types: Cigarettes    Passive exposure: Never   Smokeless tobacco: Never   Tobacco comments:    1/2 ppd or less 06/15/21. Hsm/.   Vaping Use   Vaping Use: Never used  Substance  and Sexual Activity   Alcohol use: Yes    Comment: Rum and Coke/Unsure of amount   Drug use: Never   Sexual activity: Not on file  Other Topics Concern   Not on file  Social History Narrative   Married    Two grown children    She enjoys reading, walking   Social Determinants of Health   Financial Resource Strain: Medium Risk (10/18/2021)   Overall Financial Resource Strain (CARDIA)    Difficulty of Paying Living Expenses: Somewhat hard  Food Insecurity: No Food Insecurity (11/03/2021)   Hunger Vital Sign    Worried About Running Out of Food in the Last  Year: Never true    Ran Out of Food in the Last Year: Never true  Transportation Needs: No Transportation Needs (11/03/2021)   PRAPARE - Hydrologist (Medical): No    Lack of Transportation (Non-Medical): No  Physical Activity: Unknown (10/18/2021)   Exercise Vital Sign    Days of Exercise per Week: Patient refused    Minutes of Exercise per Session: Not on file  Stress: No Stress Concern Present (10/18/2021)   [place]    Feeling of Stress : Only a little  Social Connections: Unknown (10/18/2021)   Social Connection and Isolation Panel [NHANES]    Frequency of Communication with Friends and Family: Three times a week    Frequency of Social Gatherings with Friends and Family: Patient refused    Attends Religious Services: Patient refused    Active Member of Clubs or Organizations: No    Attends Music therapist: Not on file    Marital Status: Widowed  Intimate Partner Violence: Not At Risk (11/03/2021)   Humiliation, Afraid, Rape, and Kick questionnaire    Fear of Current or Ex-Partner: No    Emotionally Abused: No    Physically Abused: No    Sexually Abused: No    Past Surgical History:  Procedure Laterality Date   BRONCHIAL BIOPSY  06/08/2021   Procedure: BRONCHIAL BIOPSIES;  Surgeon: Garner Nash, DO;  Location: [facility];  Service: Pulmonary;;   BRONCHIAL BRUSHINGS  06/08/2021   Procedure: BRONCHIAL BRUSHINGS;  Surgeon: Garner Nash, DO;  Location: [facility];  Service: Pulmonary;;   BRONCHIAL NEEDLE ASPIRATION BIOPSY  06/08/2021   Procedure: BRONCHIAL NEEDLE ASPIRATION BIOPSIES;  Surgeon: Garner Nash, DO;  Location: [facility];  Service: Pulmonary;;   COLONOSCOPY  2018   ERCP N/A 11/05/2021   Procedure: ENDOSCOPIC RETROGRADE CHOLANGIOPANCREATOGRAPHY (ERCP);  Surgeon: Irene Shipper, MD;  Location: [facility];  Service: Gastroenterology;  Laterality: N/A;    IR IMAGING GUIDED PORT INSERTION  08/12/2021   REMOVAL OF STONES  11/05/2021   Procedure: REMOVAL OF STONES;  Surgeon: Irene Shipper, MD;  Location: [facility];  Service: Gastroenterology;;   SALIVARY GLAND SURGERY     LATE 80S   SPHINCTEROTOMY  11/05/2021   Procedure: SPHINCTEROTOMY;  Surgeon: Irene Shipper, MD;  Location: [facility];  Service: Gastroenterology;;   TUBAL LIGATION  1986   VIDEO BRONCHOSCOPY WITH RADIAL ENDOBRONCHIAL ULTRASOUND  06/08/2021   Procedure: VIDEO BRONCHOSCOPY WITH RADIAL ENDOBRONCHIAL ULTRASOUND;  Surgeon: Garner Nash, DO;  Location: [facility];  Service: Pulmonary;;    Family History  Problem Relation Age of Onset   Arthritis Mother    Diabetes Mother    Heart disease Mother    Hyperlipidemia Mother    Hypertension Mother  Miscarriages / Stillbirths Mother    Hearing loss Father    Liver cancer Father    Lung cancer Father    Hyperlipidemia Father    Diabetes Brother    Lung disease Maternal Grandfather        Black Lung   Diabetes Paternal Grandmother    Cancer Paternal Grandfather    Diabetes Paternal Grandfather     Allergies  Allergen Reactions   Codeine Itching    Current Outpatient Medications on File Prior to Visit  Medication Sig Dispense Refill   albuterol (VENTOLIN HFA) 108 (90 Base) MCG/ACT inhaler Inhale 2 puffs into the lungs every 6 (six) hours as needed for wheezing or shortness of breath. 8 g 1   ELIQUIS 5 MG TABS tablet TAKE ONE TABLET BY MOUTH TWICE A DAY 60 tablet 1   folic acid (FOLVITE) 1 MG tablet Take 1 tablet (1 mg total) by mouth daily. 30 tablet 4   lidocaine-prilocaine (EMLA) cream Apply to the Port-A-Cath site 30 minutes before chemotherapy (Patient taking differently: Apply 1 Application topically daily as needed. Apply to the Port-A-Cath site 30 minutes before chemotherapy) 30 g 0   magnesium oxide (MAG-OX) 400 (240 Mg) MG tablet Take 400 mg by mouth daily.     omeprazole (PRILOSEC) 20 MG capsule Take 1  capsule (20 mg total) by mouth daily. (Patient taking differently: Take 20 mg by mouth daily as needed (For heartburn).) 30 capsule 3   potassium chloride SA (KLOR-CON M) 20 MEQ tablet Take 1 tablet (20 mEq total) by mouth daily. 10 tablet 0   prochlorperazine (COMPAZINE) 10 MG tablet Take 1 tablet (10 mg total) by mouth every 6 (six) hours as needed for nausea or vomiting. 30 tablet 0   No current facility-administered medications on file prior to visit.    BP 110/70   Pulse 77   Temp (!) 97.5 F (36.4 C) (Oral)   Ht 5\' 3"  (1.6 m)   Wt 99 lb 9.6 oz (45.2 kg)   SpO2 97%   BMI 17.64 kg/m       Objective:   Physical Exam Vitals and nursing note reviewed.  Constitutional:      Appearance: Normal appearance.  Cardiovascular:     Rate and Rhythm: Normal rate and regular rhythm.     Pulses: Normal pulses.     Heart sounds: Normal heart sounds.  Pulmonary:     Effort: Pulmonary effort is normal.     Breath sounds: Normal breath sounds.  Musculoskeletal:        General: Normal range of motion.     Right lower leg: Edema (trace pitting) present.     Left lower leg: Edema (+1 pitting) present.  Skin:    General: Skin is warm and dry.  Neurological:     General: No focal deficit present.     Mental Status: She is alert and oriented to person, place, and time.  Psychiatric:        Mood and Affect: Mood normal.        Behavior: Behavior normal.        Thought Content: Thought content normal.        Judgment: Judgment normal.       Assessment & Plan:  1. Biliary obstruction -Reviewed hospital notes, discharge instructions, labs, and imaging.  Medications reconciled.  All questions answered to the best of my ability - Follow up with GI as directed  2. Choledocholithiasis - Follow up with GI as directed  3. Lower extremity edema  - furosemide (LASIX) 20 MG tablet; Take 1 tablet (20 mg total) by mouth daily.  Dispense: 7 tablet; Refill: 0  4. History of DVT (deep vein  thrombosis) - Continue Eliquis   5. Gastroesophageal reflux disease without esophagitis - Continue PPI  6. Hypokalemia - Resolved prior to discharge   7. Tobacco use - Encouraged to quit  8. Adenocarcinoma of right lung, stage 4 [facility]) - Per oncology   [name], NP

## 2021-11-15 ENCOUNTER — Other Ambulatory Visit (INDEPENDENT_AMBULATORY_CARE_PROVIDER_SITE_OTHER): Payer: Medicare Other

## 2021-11-15 DIAGNOSIS — K831 Obstruction of bile duct: Secondary | ICD-10-CM | POA: Diagnosis not present

## 2021-11-15 LAB — CBC WITH DIFFERENTIAL/PLATELET
Basophils Absolute: 0.1 10*3/uL (ref 0.0–0.1)
Basophils Relative: 0.6 % (ref 0.0–3.0)
Eosinophils Absolute: 0.9 10*3/uL — ABNORMAL HIGH (ref 0.0–0.7)
Eosinophils Relative: 8.6 % — ABNORMAL HIGH (ref 0.0–5.0)
HCT: 24.1 % — ABNORMAL LOW (ref 36.0–46.0)
Hemoglobin: 8.2 g/dL — ABNORMAL LOW (ref 12.0–15.0)
Lymphocytes Relative: 23.1 % (ref 12.0–46.0)
Lymphs Abs: 2.3 10*3/uL (ref 0.7–4.0)
MCHC: 34.3 g/dL (ref 30.0–36.0)
MCV: 96.2 fl (ref 78.0–100.0)
Monocytes Absolute: 1.3 10*3/uL — ABNORMAL HIGH (ref 0.1–1.0)
Monocytes Relative: 12.6 % — ABNORMAL HIGH (ref 3.0–12.0)
Neutro Abs: 5.6 10*3/uL (ref 1.4–7.7)
Neutrophils Relative %: 55.1 % (ref 43.0–77.0)
Platelets: 189 10*3/uL (ref 150.0–400.0)
RBC: 2.5 Mil/uL — ABNORMAL LOW (ref 3.87–5.11)
RDW: 21.1 % — ABNORMAL HIGH (ref 11.5–15.5)
WBC: 10.1 10*3/uL (ref 4.0–10.5)

## 2021-11-15 LAB — HEPATIC FUNCTION PANEL
ALT: 23 U/L (ref 0–35)
AST: 31 U/L (ref 0–37)
Albumin: 3.7 g/dL (ref 3.5–5.2)
Alkaline Phosphatase: 317 U/L — ABNORMAL HIGH (ref 39–117)
Bilirubin, Direct: 0.4 mg/dL — ABNORMAL HIGH (ref 0.0–0.3)
Total Bilirubin: 1.1 mg/dL (ref 0.2–1.2)
Total Protein: 7.3 g/dL (ref 6.0–8.3)

## 2021-11-16 ENCOUNTER — Other Ambulatory Visit: Payer: BC Managed Care – PPO

## 2021-11-16 ENCOUNTER — Ambulatory Visit: Payer: BC Managed Care – PPO | Admitting: Gastroenterology

## 2021-11-17 ENCOUNTER — Ambulatory Visit (INDEPENDENT_AMBULATORY_CARE_PROVIDER_SITE_OTHER): Payer: Medicare Other | Admitting: Internal Medicine

## 2021-11-17 ENCOUNTER — Encounter: Payer: Self-pay | Admitting: Internal Medicine

## 2021-11-17 VITALS — BP 132/66 | HR 70 | Ht 63.0 in | Wt 97.0 lb

## 2021-11-17 DIAGNOSIS — K8050 Calculus of bile duct without cholangitis or cholecystitis without obstruction: Secondary | ICD-10-CM | POA: Diagnosis not present

## 2021-11-17 NOTE — Progress Notes (Signed)
HISTORY OF PRESENT ILLNESS:  Sue Chase is a 66 y.o. female with stage IV adenocarcinoma of the lung diagnosed April 2023 for which she has been followed by oncology and is on chemotherapy.  She also has a history of DVT complicated by pulmonary embolism for which she is on chronic Eliquis.  I saw the patient in the hospital around November 03, 2021 regarding right upper quadrant pain with nausea and vomiting.  CT scan from 1 month previous (October 03, 2021) demonstrated choledocholithiasis with biliary dilation.  Ultrasound November 03, 2021 revealed biliary dilation and gallbladder sludge after Eliquis washout she underwent ERCP November 05, 2021.  Sphincterotomy was performed and common duct stones extracted.  She was seen by general surgery regarding possible cholecystectomy.  They did not feel she was a good candidate and did not offer such.  Patient presents today for follow-up.  Repeat liver tests earlier this week showed normal liver tests except for elevated alkaline phosphatase which had significantly improved from its maximal level.  She reports no further problems with abdominal pain, nausea, or vomiting.  She has not resumed treatment for her cancer, but is anticipating such at the time of her upcoming oncology office visit.  Patient had number of questions regarding the possibility of symptomatic gallbladder disease or recurrent choledocholithiasis.  REVIEW OF SYSTEMS:  All non-GI ROS negative unless otherwise stated in the HPI except for ankle edema, excessive urination, shortness of breath, fatigue  Past Medical History:  Diagnosis Date   Colon polyps    Complication of anesthesia    tolerated propofol but other meds made her emotional / cry/ difficulty breathing/ panic   Hyperlipidemia    Rheumatic fever    Seasonal allergies    UTI (urinary tract infection)     Past Surgical History:  Procedure Laterality Date   BRONCHIAL BIOPSY  06/08/2021   Procedure:  BRONCHIAL BIOPSIES;  Surgeon: Garner Nash, DO;  Location: [facility];  Service: Pulmonary;;   BRONCHIAL BRUSHINGS  06/08/2021   Procedure: BRONCHIAL BRUSHINGS;  Surgeon: Garner Nash, DO;  Location: [facility];  Service: Pulmonary;;   BRONCHIAL NEEDLE ASPIRATION BIOPSY  06/08/2021   Procedure: BRONCHIAL NEEDLE ASPIRATION BIOPSIES;  Surgeon: Garner Nash, DO;  Location: [facility];  Service: Pulmonary;;   COLONOSCOPY  2018   ERCP N/A 11/05/2021   Procedure: ENDOSCOPIC RETROGRADE CHOLANGIOPANCREATOGRAPHY (ERCP);  Surgeon: Irene Shipper, MD;  Location: [facility];  Service: Gastroenterology;  Laterality: N/A;   IR IMAGING GUIDED PORT INSERTION  08/12/2021   REMOVAL OF STONES  11/05/2021   Procedure: REMOVAL OF STONES;  Surgeon: Irene Shipper, MD;  Location: [facility];  Service: Gastroenterology;;   SALIVARY GLAND SURGERY     LATE 80S   SPHINCTEROTOMY  11/05/2021   Procedure: SPHINCTEROTOMY;  Surgeon: Irene Shipper, MD;  Location: [facility];  Service: Gastroenterology;;   TUBAL LIGATION  1986   VIDEO BRONCHOSCOPY WITH RADIAL ENDOBRONCHIAL ULTRASOUND  06/08/2021   Procedure: VIDEO BRONCHOSCOPY WITH RADIAL ENDOBRONCHIAL ULTRASOUND;  Surgeon: Garner Nash, DO;  Location: [facility];  Service: Pulmonary;;    Social History Otila Kluver  reports that she has been smoking cigarettes. She has a 40.00 pack-year smoking history. She has never been exposed to tobacco smoke. She has never used smokeless tobacco. She reports current alcohol use. She reports that she does not use drugs.  family history includes Arthritis in her mother; Cancer in her paternal grandfather; Diabetes in her brother, mother, paternal grandfather, and  paternal grandmother; Hearing loss in her father; Heart disease in her mother; Hyperlipidemia in her father and mother; Hypertension in her mother; Liver cancer in her father; Lung cancer in her father; Lung disease in her maternal grandfather;  Miscarriages / [place] in her mother.  Allergies  Allergen Reactions   Codeine Itching       PHYSICAL EXAMINATION: Vital signs: BP 132/66   Pulse 70   Ht 5\' 3"  (1.6 m)   Wt 97 lb (44 kg)   BMI 17.18 kg/m   Constitutional: Pleasant, thin, chronically ill-appearing, no acute distress Psychiatric: alert and oriented x3, cooperative Eyes: extraocular movements intact, anicteric, conjunctiva pink Mouth: oral pharynx moist, no lesions Neck: supple no lymphadenopathy Cardiovascular: heart regular rate and rhythm, no murmur Lungs: clear to auscultation bilaterally Abdomen: soft, nontender, nondistended, no obvious ascites, no peritoneal signs, normal bowel sounds, no organomegaly Rectal: Omitted Extremities: no clubbing or cyanosis.  Trace lower extremity edema bilaterally Skin: no lesions on visible extremities Neuro: No focal deficits.  Cranial nerves intact  ASSESSMENT:  1.  Choledocholithiasis status post ERC with sphincterotomy and common duct stone extraction 2.  Cholelithiasis.  Evaluated by surgery.  They are not planning cholecystectomy 3.  Stage IV adenocarcinoma of the lung.  Being treated by oncology 4.  History of DVT/PE on Eliquis   PLAN:  1.  The patient and I reviewed the anatomy and the pathophysiology of her stone disease.  I discussed with her that she could develop symptomatic gallbladder disease in which case she may need antibiotics and/or cholecystostomy tube and/or surgery.  I also discussed that she may develop recurrent choledocholithiasis since her gallbladder is intact.  Is hopeful that with her sphincterotomy, stones might pass spontaneously without causing clinical issues.  She understands. 2.  Return to the care of oncology who will monitor her clinically as well as her blood work.

## 2021-11-17 NOTE — Patient Instructions (Signed)
_______________________________________________________  If you are age 66 or older, your body mass index should be between 23-30. Your Body mass index is 17.18 kg/m. If this is out of the aforementioned range listed, please consider follow up with your Primary Care Provider.  If you are age 74 or younger, your body mass index should be between 19-25. Your Body mass index is 17.18 kg/m. If this is out of the aformentioned range listed, please consider follow up with your Primary Care Provider.   ________________________________________________________  The [facility] providers would like to encourage you to use [facility] to communicate with providers for non-urgent requests or questions.  Due to long hold times on the telephone, sending your provider a message by [facility] may be a faster and more efficient way to get a response.  Please allow 48 business hours for a response.  Please remember that this is for non-urgent requests.  _______________________________________________________  Please follow up as needed

## 2021-11-18 NOTE — Progress Notes (Unsigned)
Sue Chase OFFICE PROGRESS NOTE  [name], NP [place] [address]  DIAGNOSIS: Stage IV (T2a, N0, M1a) non-small cell lung cancer, adenocarcinoma presented with multifocal bilateral pulmonary nodules involving the right upper lobe as well as the smaller bilateral groundglass opacities diagnosed in April 2023.  PD-L1 expression is 4%.  Molecular studies by [employer] 360 tissue test showed positive KRAS G12C mutation but the blood test failed secondary to insufficient circulating tumor DNA.  PRIOR THERAPY: None  CURRENT THERAPY:  Systemic chemotherapy with carboplatin for AUC of 5, Alimta 500 Mg/M2 and Keytruda 200 Mg IV every 3 weeks.  First dose 08/10/2021.  Status post 4 cycles.   INTERVAL HISTORY: Sue Chase 66 y.o. female returns to the clinic today for follow-up visit. The patient is currently undergoing chemotherapy and immunotherapy.  She tolerates this fairly well.  When she was seen on 10/12/2021, she recently had a restaging CT scan which incidentally noted choledocholithiasis.  The patient was asymptomatic at that time.  She was referred to GI.  Before she can be seen by GI, the patient presented 3 weeks later for her next chemo follow-up visit.  She had elevated LFTs that day and was subsequently sent to the emergency room. She also had anemia and required a blood transfusion. During her admission, she underwent ERCP on September 15 and the common bile duct stones were extracted.  She was also seen by general surgery regarding possible cholecystectomy and did not feel like she was a good candidate.  She followed up outpatient with Dr. Henrene Pastor from GI on 11/17/21.   Since last being seen, the patient feels ready to resume her chemotherapy. She mentions next week she is going on vacation to [facility] from 10/11-10/13. She would be open to making a lab visit next week before her trip to ensure she does not require supportive care before  her trip (such as a blood transfusion). She reports stable fatigue.  Today she denies any fever, chills, or night sweats. She reports 2-3 lbs of weight loss during her hospitalization. Her appetite is coming back. Denies any abdominal pain, jaundice, or itching.  Denies any nausea, vomiting, diarrhea, or constipation.  She reports mild baseline dyspnea only with exertion. She states she does not have a significant cough. Denies any hemoptysis or chest pain.  Denies any headache or visual changes.  Denies any rashes or skin changes.  She mentions she has a single mouth sore, which occurred after her recent procedure and she is wondering if it is from the scope. It is slowly getting better. She also mentions her nails are thinning on her pointer fingers bilaterally. She is here today for evaluation and repeat blood work before starting cycle #5    MEDICAL HISTORY: Past Medical History:  Diagnosis Date   Colon polyps    Complication of anesthesia    tolerated propofol but other meds made her emotional / cry/ difficulty breathing/ panic   Hyperlipidemia    Rheumatic fever    Seasonal allergies    UTI (urinary tract infection)     ALLERGIES:  is allergic to codeine.  MEDICATIONS:  Current Outpatient Medications  Medication Sig Dispense Refill   albuterol (VENTOLIN HFA) 108 (90 Base) MCG/ACT inhaler Inhale 2 puffs into the lungs every 6 (six) hours as needed for wheezing or shortness of breath. 8 g 1   ELIQUIS 5 MG TABS tablet TAKE ONE TABLET BY MOUTH TWICE A DAY 60 tablet 1  folic acid (FOLVITE) 1 MG tablet Take 1 tablet (1 mg total) by mouth daily. 30 tablet 4   lidocaine-prilocaine (EMLA) cream Apply to the Port-A-Cath site 30 minutes before chemotherapy (Patient taking differently: Apply 1 Application topically daily as needed. Apply to the Port-A-Cath site 30 minutes before chemotherapy) 30 g 0   magnesium oxide (MAG-OX) 400 (240 Mg) MG tablet Take 400 mg by mouth daily.     omeprazole  (PRILOSEC) 20 MG capsule Take 1 capsule (20 mg total) by mouth daily. (Patient taking differently: Take 20 mg by mouth daily as needed (For heartburn).) 30 capsule 3   potassium chloride SA (KLOR-CON M) 20 MEQ tablet Take 1 tablet (20 mEq total) by mouth daily. 10 tablet 0   prochlorperazine (COMPAZINE) 10 MG tablet Take 1 tablet (10 mg total) by mouth every 6 (six) hours as needed for nausea or vomiting. 30 tablet 0   No current facility-administered medications for this visit.    SURGICAL HISTORY:  Past Surgical History:  Procedure Laterality Date   BRONCHIAL BIOPSY  06/08/2021   Procedure: BRONCHIAL BIOPSIES;  Surgeon: Garner Nash, DO;  Location: [facility];  Service: Pulmonary;;   BRONCHIAL BRUSHINGS  06/08/2021   Procedure: BRONCHIAL BRUSHINGS;  Surgeon: Garner Nash, DO;  Location: [facility];  Service: Pulmonary;;   BRONCHIAL NEEDLE ASPIRATION BIOPSY  06/08/2021   Procedure: BRONCHIAL NEEDLE ASPIRATION BIOPSIES;  Surgeon: Garner Nash, DO;  Location: [facility];  Service: Pulmonary;;   COLONOSCOPY  2018   ERCP N/A 11/05/2021   Procedure: ENDOSCOPIC RETROGRADE CHOLANGIOPANCREATOGRAPHY (ERCP);  Surgeon: Irene Shipper, MD;  Location: [facility];  Service: Gastroenterology;  Laterality: N/A;   IR IMAGING GUIDED PORT INSERTION  08/12/2021   REMOVAL OF STONES  11/05/2021   Procedure: REMOVAL OF STONES;  Surgeon: Irene Shipper, MD;  Location: [facility];  Service: Gastroenterology;;   SALIVARY GLAND SURGERY     LATE 80S   SPHINCTEROTOMY  11/05/2021   Procedure: SPHINCTEROTOMY;  Surgeon: Irene Shipper, MD;  Location: [facility];  Service: Gastroenterology;;   TUBAL LIGATION  1986   VIDEO BRONCHOSCOPY WITH RADIAL ENDOBRONCHIAL ULTRASOUND  06/08/2021   Procedure: VIDEO BRONCHOSCOPY WITH RADIAL ENDOBRONCHIAL ULTRASOUND;  Surgeon: Garner Nash, DO;  Location: [facility];  Service: Pulmonary;;    REVIEW OF SYSTEMS:   Review of Systems  Constitutional: Positive for  fatigue fatigue. Positive for diminished but slightly improving appetite. Negative for chills, fever and unexpected weight change.  HENT: Positive for single mouth ulcer. Negative for mouth sores, nosebleeds, sore throat and trouble swallowing.   Eyes: Negative for eye problems and icterus.  Respiratory: Positive for intermittent mild dyspnea on exertion, only with exertion. Negative for cough, hemoptysis, and wheezing.   Cardiovascular: Negative for chest pain and leg swelling.  Gastrointestinal: Negative for abdominal pain, constipation, diarrhea, nausea and vomiting.  Genitourinary: Negative for bladder incontinence, difficulty urinating, dysuria, frequency and hematuria.   Musculoskeletal: Negative for back pain, gait problem, neck pain and neck stiffness.  Skin: Negative for itching and rash.  Neurological: Negative for dizziness, extremity weakness, gait problem, headaches, light-headedness and seizures.  Hematological: Negative for adenopathy. Does not bruise/bleed easily.  Psychiatric/Behavioral: Negative for confusion, depression and sleep disturbance. The patient is not nervous/anxious.     PHYSICAL EXAMINATION:  Blood pressure 123/73, pulse 74, temperature (!) 97.5 F (36.4 C), temperature source Oral, resp. rate 16, weight 97 lb (44 kg), SpO2 99 %.  ECOG PERFORMANCE STATUS: 1  Physical Exam  Constitutional: Oriented to person, place, and time and well-developed, well-nourished, and in no distress.  HENT:  Head: Normocephalic and atraumatic.  Mouth/Throat: Oropharynx is clear and moist. No oropharyngeal exudate.  Eyes: Conjunctivae are normal. Right eye exhibits no discharge. Left eye exhibits no discharge. No scleral icterus.  Neck: Normal range of motion. Neck supple.  Cardiovascular: Normal rate, regular rhythm, normal heart sounds and intact distal pulses.   Pulmonary/Chest: Effort normal and breath sounds normal. No respiratory distress. No wheezes. No rales.  Abdominal:  Soft. Bowel sounds are normal. Exhibits no distension and no mass. There is no tenderness.  Musculoskeletal: Normal range of motion. Exhibits no edema.  Lymphadenopathy:    No cervical adenopathy.  Neurological: Alert and oriented to person, place, and time. Exhibits normal muscle tone. Gait normal. Coordination normal.  Skin: Skin is warm and dry. No rash noted. Not diaphoretic. No erythema. No pallor.  Psychiatric: Mood, memory and judgment normal.  Vitals reviewed.  LABORATORY DATA: Lab Results  Component Value Date   WBC 9.9 11/23/2021   HGB 8.7 (L) 11/23/2021   HCT 26.1 (L) 11/23/2021   MCV 99.2 11/23/2021   PLT 228 11/23/2021      Chemistry      Component Value Date/Time   NA 141 11/06/2021 0446   K 4.1 11/06/2021 0446   CL 113 (H) 11/06/2021 0446   CO2 23 11/06/2021 0446   BUN 10 11/06/2021 0446   CREATININE 0.80 11/06/2021 0446   CREATININE 0.68 11/02/2021 1030      Component Value Date/Time   CALCIUM 8.8 (L) 11/06/2021 0446   ALKPHOS 317 (H) 11/15/2021 1344   AST 31 11/15/2021 1344   AST 53 (H) 11/02/2021 1030   ALT 23 11/15/2021 1344   ALT 65 (H) 11/02/2021 1030   BILITOT 1.1 11/15/2021 1344   BILITOT 1.4 (H) 11/02/2021 1030       RADIOGRAPHIC STUDIES:  DG ERCP  Result Date: 11/05/2021 CLINICAL DATA:  66 year old female with a history choledocholithiasis EXAM: ERCP TECHNIQUE: Multiple spot images obtained with the fluoroscopic device and submitted for interpretation post-procedure. FLUOROSCOPY: Radiation Exposure Index (as provided by the fluoroscopic device): 102 mGy Kerma COMPARISON:  CT 10/03/2021 FINDINGS: Limited intraoperative fluoroscopic spot images during ERCP. Initial image demonstrates the endoscope projecting over the upper abdomen. Subsequently there is placement of a safety wire within the common bile duct and retrograde infusion of contrast. Deployment of a retrieval balloon. Partial opacification of duodenal diverticulum adjacent to the  ampulla. IMPRESSION: Limited images during ERCP demonstrates treatment of choledocholithiasis with deployment of a retrieval balloon. Partial opacification of duodenal diverticulum. Please refer to the dictated operative report for full details of intraoperative findings and procedure. Electronically Signed   By: Corrie Mckusick D.O.   On: 11/05/2021 15:25   US Abdomen Limited RUQ (LIVER/GB)  Result Date: 11/03/2021 CLINICAL DATA:  Alkaline phosphatase elevation EXAM: ULTRASOUND ABDOMEN LIMITED RIGHT UPPER QUADRANT COMPARISON:  CT 10/03/2021 FINDINGS: Gallbladder: Small amount of sludge in the gallbladder. No stones. No wall thickening or surrounding fluid. No Murphy sign. Common bile duct: Diameter: 9.9 mm.  No obstructing lesion identified. Liver: Mild intrahepatic ductal prominence. Mildly heterogeneous echotexture that could go along with mild steatosis. No evidence of focal mass. Portal vein is patent on color Doppler imaging with normal direction of blood flow towards the liver. Other: None. IMPRESSION: Mild intrahepatic biliary ductal prominence. Dilated common bile duct, approximally 1 cm. Small amount of sludge in the gallbladder. Choledocholithiasis was demonstrated at previous CT  within the distal duct. The distal duct is not well seen on today's ultrasound. Electronically Signed   By: Nelson Chimes M.D.   On: 11/03/2021 12:41     ASSESSMENT/PLAN:  This very pleasant 66 year old Caucasian female diagnosed with stage IV (T2 a, N0, M1 a) non-small cell lung cancer, adenocarcinoma.  The patient presented multifocal bilateral pulmonary nodules involving the right upper lobe as well as small bilateral groundglass opacities.  She was diagnosed in April 2023.  She is positive for a K-ras G12 C mutation and her PD-L1 expression of 4%.  Her K-ras G12 C mutation can be targeted in the second line setting in the future.   The patient is currently undergoing systemic chemotherapy with carboplatin for an AUC of  5, Alimta 500 mg per metered square, Keytruda 200 mg IV every 3 weeks.  She is status post 4 cycles.  She tolerates this fairly well.   The patient was seen with Dr. Julien Nordmann today. Labs were reviewed. Recommend that she proceed with cycle #5 today. She is starting maintenance Alimta and Keytruda starting from today.   We will arrange for labs before her trip on 10/9 to ensure she does not need tranfusion support. Hopeful that since she is no longer receiving carboplatin that she will not have further/significant drops in her blood count. But we will monitor until her next appointment in 3 weeks to ensure stability/improvement. Then starting after cycle #6, we will no longer arrange weekly labs unless concerns. The patient is in agreement with this plan. She is going to start taking an iron supplement. She is compliant with her folic acid. I have added a sample to blood bank to be drawn weekly for the next 3 weeks.   Her mouth sore is improving. Encouraged good oral hygiene and salt water rinses/biotene.   We will see her back for a follow up visit in 3 weeks for repeat blood work before starting cycle #6.   The patient was advised to call immediately if she has any concerning symptoms in the interval. The patient voices understanding of current disease status and treatment options and is in agreement with the current care plan. All questions were answered. The patient knows to call the clinic with any problems, questions or concerns. We can certainly see the patient much sooner if necessary         Orders Placed This Encounter  Procedures   Sample to Blood Bank    Standing Status:   Standing    Number of Occurrences:   3    Standing Expiration Date:   11/24/2022     Tobe Sos Woodley Petzold, PA-C 11/23/21  ADDENDUM: Hematology/Oncology Attending: I had a face-to-face encounter with the patient today.  I reviewed her records, lab and recommended her care plan.  This is a very  pleasant 66 years old white female with stage IV non-small cell lung cancer, adenocarcinoma presented with multifocal bilateral pulmonary nodules involving the right upper lobe, small bilateral groundglass opacities diagnosed in April 2023.  The patient has positive KRAS G12C mutation and PD-L1 expression of 4%.  She started systemic chemotherapy with carboplatin, Alimta and Keytruda status post 4 cycles.  She has been tolerating her treatment with this regimen fairly well. She was recently admitted to the hospital with cholelithiasis and she underwent ERCP with removal of some of the biliary stones by Dr. Henrene Pastor.  She is feeling much better. I recommended for the patient to proceed with cycle #5 which is the  first cycle of maintenance treatment with Alimta and Keytruda. She will come back for follow-up visit in 3 weeks for evaluation before the next cycle of her treatment. For the anemia, we advised the patient to start taking over-the-counter oral iron supplements.  We will continue to monitor her closely and consider her for transfusion if needed. The patient was advised to call immediately if she has any other concerning symptoms in the interval. Disclaimer: This note was dictated with voice recognition software. Similar sounding words can inadvertently be transcribed and may be missed upon review. Eilleen Kempf, MD

## 2021-11-23 ENCOUNTER — Inpatient Hospital Stay: Payer: Medicare Other

## 2021-11-23 ENCOUNTER — Inpatient Hospital Stay: Payer: Medicare Other | Attending: Oncology

## 2021-11-23 ENCOUNTER — Inpatient Hospital Stay (HOSPITAL_BASED_OUTPATIENT_CLINIC_OR_DEPARTMENT_OTHER): Payer: Medicare Other | Admitting: Physician Assistant

## 2021-11-23 VITALS — BP 123/73 | HR 74 | Temp 97.5°F | Resp 16 | Wt 97.0 lb

## 2021-11-23 DIAGNOSIS — R0602 Shortness of breath: Secondary | ICD-10-CM | POA: Insufficient documentation

## 2021-11-23 DIAGNOSIS — Z5112 Encounter for antineoplastic immunotherapy: Secondary | ICD-10-CM | POA: Diagnosis not present

## 2021-11-23 DIAGNOSIS — Z79899 Other long term (current) drug therapy: Secondary | ICD-10-CM | POA: Insufficient documentation

## 2021-11-23 DIAGNOSIS — C3491 Malignant neoplasm of unspecified part of right bronchus or lung: Secondary | ICD-10-CM

## 2021-11-23 DIAGNOSIS — Z5111 Encounter for antineoplastic chemotherapy: Secondary | ICD-10-CM | POA: Diagnosis not present

## 2021-11-23 DIAGNOSIS — E538 Deficiency of other specified B group vitamins: Secondary | ICD-10-CM | POA: Insufficient documentation

## 2021-11-23 DIAGNOSIS — R5383 Other fatigue: Secondary | ICD-10-CM | POA: Insufficient documentation

## 2021-11-23 DIAGNOSIS — C3411 Malignant neoplasm of upper lobe, right bronchus or lung: Secondary | ICD-10-CM | POA: Insufficient documentation

## 2021-11-23 DIAGNOSIS — H5789 Other specified disorders of eye and adnexa: Secondary | ICD-10-CM | POA: Diagnosis not present

## 2021-11-23 DIAGNOSIS — Z95828 Presence of other vascular implants and grafts: Secondary | ICD-10-CM

## 2021-11-23 LAB — CBC WITH DIFFERENTIAL (CANCER CENTER ONLY)
Abs Immature Granulocytes: 0.02 10*3/uL (ref 0.00–0.07)
Basophils Absolute: 0 10*3/uL (ref 0.0–0.1)
Basophils Relative: 0 %
Eosinophils Absolute: 1.7 10*3/uL — ABNORMAL HIGH (ref 0.0–0.5)
Eosinophils Relative: 17 %
HCT: 26.1 % — ABNORMAL LOW (ref 36.0–46.0)
Hemoglobin: 8.7 g/dL — ABNORMAL LOW (ref 12.0–15.0)
Immature Granulocytes: 0 %
Lymphocytes Relative: 19 %
Lymphs Abs: 1.9 10*3/uL (ref 0.7–4.0)
MCH: 33.1 pg (ref 26.0–34.0)
MCHC: 33.3 g/dL (ref 30.0–36.0)
MCV: 99.2 fL (ref 80.0–100.0)
Monocytes Absolute: 1 10*3/uL (ref 0.1–1.0)
Monocytes Relative: 10 %
Neutro Abs: 5.2 10*3/uL (ref 1.7–7.7)
Neutrophils Relative %: 54 %
Platelet Count: 228 10*3/uL (ref 150–400)
RBC: 2.63 MIL/uL — ABNORMAL LOW (ref 3.87–5.11)
RDW: 17.5 % — ABNORMAL HIGH (ref 11.5–15.5)
WBC Count: 9.9 10*3/uL (ref 4.0–10.5)
nRBC: 0 % (ref 0.0–0.2)

## 2021-11-23 LAB — CMP (CANCER CENTER ONLY)
ALT: 15 U/L (ref 0–44)
AST: 19 U/L (ref 15–41)
Albumin: 3.7 g/dL (ref 3.5–5.0)
Alkaline Phosphatase: 230 U/L — ABNORMAL HIGH (ref 38–126)
Anion gap: 6 (ref 5–15)
BUN: 11 mg/dL (ref 8–23)
CO2: 28 mmol/L (ref 22–32)
Calcium: 9.1 mg/dL (ref 8.9–10.3)
Chloride: 103 mmol/L (ref 98–111)
Creatinine: 0.69 mg/dL (ref 0.44–1.00)
GFR, Estimated: >60 mL/min (ref >60)
Glucose, Bld: 109 mg/dL — ABNORMAL HIGH (ref 70–99)
Potassium: 3.3 mmol/L — ABNORMAL LOW (ref 3.5–5.1)
Sodium: 137 mmol/L (ref 135–145)
Total Bilirubin: 0.7 mg/dL (ref 0.3–1.2)
Total Protein: 6.8 g/dL (ref 6.5–8.1)

## 2021-11-23 LAB — TSH: TSH: 2.467 u[IU]/mL (ref 0.350–4.500)

## 2021-11-23 MED ORDER — SODIUM CHLORIDE 0.9% FLUSH
10.0000 mL | INTRAVENOUS | Status: DC | PRN
  Administered 2021-11-23: 10 mL

## 2021-11-23 MED ORDER — HEPARIN SOD (PORK) LOCK FLUSH 100 UNIT/ML IV SOLN
500.0000 [IU] | Freq: Once | INTRAVENOUS | Status: AC | PRN
  Administered 2021-11-23: 500 [IU]

## 2021-11-23 MED ORDER — SODIUM CHLORIDE 0.9% FLUSH
10.0000 mL | Freq: Once | INTRAVENOUS | Status: AC
  Administered 2021-11-23: 10 mL

## 2021-11-23 MED ORDER — SODIUM CHLORIDE 0.9 % IV SOLN
500.0000 mg/m2 | Freq: Once | INTRAVENOUS | Status: AC
  Administered 2021-11-23: 700 mg via INTRAVENOUS
  Filled 2021-11-23: qty 20

## 2021-11-23 MED ORDER — SODIUM CHLORIDE 0.9 % IV SOLN: Freq: Once | INTRAVENOUS | Status: AC

## 2021-11-23 MED ORDER — PROCHLORPERAZINE MALEATE 10 MG PO TABS
10.0000 mg | ORAL_TABLET | Freq: Once | ORAL | Status: AC
  Administered 2021-11-23: 10 mg via ORAL
  Filled 2021-11-23: qty 1

## 2021-11-23 MED ORDER — SODIUM CHLORIDE 0.9 % IV SOLN
200.0000 mg | Freq: Once | INTRAVENOUS | Status: AC
  Administered 2021-11-23: 200 mg via INTRAVENOUS
  Filled 2021-11-23: qty 200

## 2021-11-23 NOTE — Patient Instructions (Signed)
[facility]  Discharge Instructions: Thank you for choosing [facility] to provide your oncology and hematology care.   If you have a lab appointment with the [facility], please go directly to the [facility] and check in at the registration area.   Wear comfortable clothing and clothing appropriate for easy access to any Portacath or PICC line.   We strive to give you quality time with your provider. You may need to reschedule your appointment if you arrive late (15 or more minutes).  Arriving late affects you and other patients whose appointments are after yours.  Also, if you miss three or more appointments without notifying the office, you may be dismissed from the clinic at the provider's discretion.      For prescription refill requests, have your pharmacy contact our office and allow 72 hours for refills to be completed.    Today you received the following chemotherapy and/or immunotherapy agents; Keytruda & Alimta      To help prevent nausea and vomiting after your treatment, we encourage you to take your nausea medication as directed.  BELOW ARE SYMPTOMS THAT SHOULD BE REPORTED IMMEDIATELY: *FEVER GREATER THAN 100.4 F (38 C) OR HIGHER *CHILLS OR SWEATING *NAUSEA AND VOMITING THAT IS NOT CONTROLLED WITH YOUR NAUSEA MEDICATION *UNUSUAL SHORTNESS OF BREATH *UNUSUAL BRUISING OR BLEEDING *URINARY PROBLEMS (pain or burning when urinating, or frequent urination) *BOWEL PROBLEMS (unusual diarrhea, constipation, pain near the anus) TENDERNESS IN MOUTH AND THROAT WITH OR WITHOUT PRESENCE OF ULCERS (sore throat, sores in mouth, or a toothache) UNUSUAL RASH, SWELLING OR PAIN  UNUSUAL VAGINAL DISCHARGE OR ITCHING   Items with * indicate a potential emergency and should be followed up as soon as possible or go to the Emergency Department if any problems should occur.  Please show the CHEMOTHERAPY ALERT CARD or IMMUNOTHERAPY ALERT CARD at  check-in to the Emergency Department and triage nurse.  Should you have questions after your visit or need to cancel or reschedule your appointment, please contact [facility]  Dept: [phone]  and follow the prompts.  Office hours are 8:00 a.m. to 4:30 p.m. Monday - Friday. Please note that voicemails left after 4:00 p.m. may not be returned until the following business day.  We are closed weekends and major holidays. You have access to a nurse at all times for urgent questions. Please call the main number to the clinic Dept: [phone] and follow the prompts.   For any non-urgent questions, you may also contact your provider using MyChart. We now offer e-Visits for anyone 77 and older to request care online for non-urgent symptoms. For details visit [email or web address].   Also download the MyChart app! Go to the app store, search "MyChart", open the app, select [facility], and log in with your MyChart username and password.  Masks are optional in the cancer centers. If you would like for your care team to wear a mask while they are taking care of you, please let them know. You may have one support Sue Chase who is at least 66 years old accompany you for your appointments.

## 2021-11-25 ENCOUNTER — Other Ambulatory Visit: Payer: Self-pay

## 2021-11-25 LAB — T4: T4, Total: 8.7 ug/dL (ref 4.5–12.0)

## 2021-11-26 ENCOUNTER — Other Ambulatory Visit: Payer: Self-pay | Admitting: Medical Oncology

## 2021-11-26 ENCOUNTER — Other Ambulatory Visit: Payer: Self-pay

## 2021-11-26 DIAGNOSIS — C3491 Malignant neoplasm of unspecified part of right bronchus or lung: Secondary | ICD-10-CM

## 2021-11-27 ENCOUNTER — Other Ambulatory Visit: Payer: Self-pay

## 2021-11-28 ENCOUNTER — Other Ambulatory Visit: Payer: Self-pay

## 2021-11-29 ENCOUNTER — Telehealth: Payer: Self-pay | Admitting: Medical Oncology

## 2021-11-29 ENCOUNTER — Inpatient Hospital Stay: Payer: Medicare Other

## 2021-11-29 DIAGNOSIS — Z5111 Encounter for antineoplastic chemotherapy: Secondary | ICD-10-CM | POA: Diagnosis not present

## 2021-11-29 DIAGNOSIS — Z95828 Presence of other vascular implants and grafts: Secondary | ICD-10-CM

## 2021-11-29 DIAGNOSIS — C3491 Malignant neoplasm of unspecified part of right bronchus or lung: Secondary | ICD-10-CM

## 2021-11-29 LAB — CBC WITH DIFFERENTIAL (CANCER CENTER ONLY)
Abs Immature Granulocytes: 0.03 10*3/uL (ref 0.00–0.07)
Basophils Absolute: 0 10*3/uL (ref 0.0–0.1)
Basophils Relative: 0 %
Eosinophils Absolute: 0.7 10*3/uL — ABNORMAL HIGH (ref 0.0–0.5)
Eosinophils Relative: 12 %
HCT: 24.5 % — ABNORMAL LOW (ref 36.0–46.0)
Hemoglobin: 8.4 g/dL — ABNORMAL LOW (ref 12.0–15.0)
Immature Granulocytes: 1 %
Lymphocytes Relative: 32 %
Lymphs Abs: 1.9 10*3/uL (ref 0.7–4.0)
MCH: 33.3 pg (ref 26.0–34.0)
MCHC: 34.3 g/dL (ref 30.0–36.0)
MCV: 97.2 fL (ref 80.0–100.0)
Monocytes Absolute: 0.1 10*3/uL (ref 0.1–1.0)
Monocytes Relative: 2 %
Neutro Abs: 3.2 10*3/uL (ref 1.7–7.7)
Neutrophils Relative %: 53 %
Platelet Count: 202 10*3/uL (ref 150–400)
RBC: 2.52 MIL/uL — ABNORMAL LOW (ref 3.87–5.11)
RDW: 15.7 % — ABNORMAL HIGH (ref 11.5–15.5)
WBC Count: 5.9 10*3/uL (ref 4.0–10.5)
nRBC: 0 % (ref 0.0–0.2)

## 2021-11-29 LAB — CMP (CANCER CENTER ONLY)
ALT: 10 U/L (ref 0–44)
AST: 21 U/L (ref 15–41)
Albumin: 3.7 g/dL (ref 3.5–5.0)
Alkaline Phosphatase: 162 U/L — ABNORMAL HIGH (ref 38–126)
Anion gap: 5 (ref 5–15)
BUN: 14 mg/dL (ref 8–23)
CO2: 29 mmol/L (ref 22–32)
Calcium: 9.1 mg/dL (ref 8.9–10.3)
Chloride: 100 mmol/L (ref 98–111)
Creatinine: 0.76 mg/dL (ref 0.44–1.00)
GFR, Estimated: >60 mL/min (ref >60)
Glucose, Bld: 106 mg/dL — ABNORMAL HIGH (ref 70–99)
Potassium: 3.3 mmol/L — ABNORMAL LOW (ref 3.5–5.1)
Sodium: 134 mmol/L — ABNORMAL LOW (ref 135–145)
Total Bilirubin: 0.6 mg/dL (ref 0.3–1.2)
Total Protein: 7.4 g/dL (ref 6.5–8.1)

## 2021-11-29 LAB — SAMPLE TO BLOOD BANK

## 2021-11-29 MED ORDER — SODIUM CHLORIDE 0.9% FLUSH
10.0000 mL | Freq: Once | INTRAVENOUS | Status: AC
  Administered 2021-11-29: 10 mL

## 2021-11-29 MED ORDER — HEPARIN SOD (PORK) LOCK FLUSH 100 UNIT/ML IV SOLN
500.0000 [IU] | Freq: Once | INTRAVENOUS | Status: AC
  Administered 2021-11-29: 500 [IU]

## 2021-11-29 NOTE — Telephone Encounter (Signed)
Lab called with HGB 8.4 and a sample was sent to [facility].

## 2021-11-30 ENCOUNTER — Other Ambulatory Visit: Payer: BC Managed Care – PPO

## 2021-12-06 ENCOUNTER — Inpatient Hospital Stay: Payer: Medicare Other

## 2021-12-06 ENCOUNTER — Other Ambulatory Visit: Payer: Self-pay | Admitting: Medical Oncology

## 2021-12-06 ENCOUNTER — Other Ambulatory Visit: Payer: Self-pay

## 2021-12-06 DIAGNOSIS — D649 Anemia, unspecified: Secondary | ICD-10-CM

## 2021-12-06 DIAGNOSIS — Z95828 Presence of other vascular implants and grafts: Secondary | ICD-10-CM

## 2021-12-06 DIAGNOSIS — Z5111 Encounter for antineoplastic chemotherapy: Secondary | ICD-10-CM | POA: Diagnosis not present

## 2021-12-06 DIAGNOSIS — C3491 Malignant neoplasm of unspecified part of right bronchus or lung: Secondary | ICD-10-CM

## 2021-12-06 LAB — CBC WITH DIFFERENTIAL (CANCER CENTER ONLY)
Abs Immature Granulocytes: 0.02 10*3/uL (ref 0.00–0.07)
Basophils Absolute: 0 10*3/uL (ref 0.0–0.1)
Basophils Relative: 0 %
Eosinophils Absolute: 0.8 10*3/uL — ABNORMAL HIGH (ref 0.0–0.5)
Eosinophils Relative: 19 %
HCT: 22.2 % — ABNORMAL LOW (ref 36.0–46.0)
Hemoglobin: 7.5 g/dL — ABNORMAL LOW (ref 12.0–15.0)
Immature Granulocytes: 1 %
Lymphocytes Relative: 37 %
Lymphs Abs: 1.6 10*3/uL (ref 0.7–4.0)
MCH: 33.5 pg (ref 26.0–34.0)
MCHC: 33.8 g/dL (ref 30.0–36.0)
MCV: 99.1 fL (ref 80.0–100.0)
Monocytes Absolute: 0.9 10*3/uL (ref 0.1–1.0)
Monocytes Relative: 21 %
Neutro Abs: 1 10*3/uL — ABNORMAL LOW (ref 1.7–7.7)
Neutrophils Relative %: 22 %
Platelet Count: 191 10*3/uL (ref 150–400)
RBC: 2.24 MIL/uL — ABNORMAL LOW (ref 3.87–5.11)
RDW: 15.8 % — ABNORMAL HIGH (ref 11.5–15.5)
WBC Count: 4.4 10*3/uL (ref 4.0–10.5)
nRBC: 0 % (ref 0.0–0.2)

## 2021-12-06 LAB — CMP (CANCER CENTER ONLY)
ALT: 9 U/L (ref 0–44)
AST: 18 U/L (ref 15–41)
Albumin: 3.5 g/dL (ref 3.5–5.0)
Alkaline Phosphatase: 129 U/L — ABNORMAL HIGH (ref 38–126)
Anion gap: 5 (ref 5–15)
BUN: 9 mg/dL (ref 8–23)
CO2: 29 mmol/L (ref 22–32)
Calcium: 8.7 mg/dL — ABNORMAL LOW (ref 8.9–10.3)
Chloride: 106 mmol/L (ref 98–111)
Creatinine: 0.71 mg/dL (ref 0.44–1.00)
GFR, Estimated: >60 mL/min (ref >60)
Glucose, Bld: 98 mg/dL (ref 70–99)
Potassium: 2.8 mmol/L — ABNORMAL LOW (ref 3.5–5.1)
Sodium: 140 mmol/L (ref 135–145)
Total Bilirubin: 0.3 mg/dL (ref 0.3–1.2)
Total Protein: 6.7 g/dL (ref 6.5–8.1)

## 2021-12-06 LAB — SAMPLE TO BLOOD BANK

## 2021-12-06 MED ORDER — SODIUM CHLORIDE 0.9% FLUSH
10.0000 mL | Freq: Once | INTRAVENOUS | Status: AC
  Administered 2021-12-06: 10 mL

## 2021-12-06 MED ORDER — HEPARIN SOD (PORK) LOCK FLUSH 100 UNIT/ML IV SOLN
500.0000 [IU] | Freq: Once | INTRAVENOUS | Status: AC
  Administered 2021-12-06: 500 [IU]

## 2021-12-07 ENCOUNTER — Other Ambulatory Visit: Payer: Self-pay | Admitting: Medical Oncology

## 2021-12-07 ENCOUNTER — Other Ambulatory Visit: Payer: BC Managed Care – PPO

## 2021-12-07 DIAGNOSIS — Z5112 Encounter for antineoplastic immunotherapy: Secondary | ICD-10-CM

## 2021-12-08 ENCOUNTER — Other Ambulatory Visit: Payer: Self-pay

## 2021-12-08 ENCOUNTER — Inpatient Hospital Stay: Payer: Medicare Other

## 2021-12-08 VITALS — BP 123/68 | HR 80 | Temp 97.3°F | Resp 16

## 2021-12-08 DIAGNOSIS — D649 Anemia, unspecified: Secondary | ICD-10-CM

## 2021-12-08 DIAGNOSIS — Z95828 Presence of other vascular implants and grafts: Secondary | ICD-10-CM

## 2021-12-08 DIAGNOSIS — Z5111 Encounter for antineoplastic chemotherapy: Secondary | ICD-10-CM | POA: Diagnosis not present

## 2021-12-08 LAB — PREPARE RBC (CROSSMATCH)

## 2021-12-08 MED ORDER — HEPARIN SOD (PORK) LOCK FLUSH 100 UNIT/ML IV SOLN
500.0000 [IU] | Freq: Once | INTRAVENOUS | Status: AC
  Administered 2021-12-08: 500 [IU]

## 2021-12-08 MED ORDER — SODIUM CHLORIDE 0.9% FLUSH
10.0000 mL | Freq: Once | INTRAVENOUS | Status: AC
  Administered 2021-12-08: 10 mL

## 2021-12-08 MED ORDER — DIPHENHYDRAMINE HCL 25 MG PO CAPS
25.0000 mg | ORAL_CAPSULE | Freq: Once | ORAL | Status: AC
  Administered 2021-12-08: 25 mg via ORAL
  Filled 2021-12-08: qty 1

## 2021-12-08 MED ORDER — ACETAMINOPHEN 325 MG PO TABS
650.0000 mg | ORAL_TABLET | Freq: Once | ORAL | Status: AC
  Administered 2021-12-08: 650 mg via ORAL
  Filled 2021-12-08: qty 2

## 2021-12-08 NOTE — Patient Instructions (Signed)

## 2021-12-09 LAB — TYPE AND SCREEN
ABO/RH(D): A POS
Antibody Screen: NEGATIVE
Unit division: 0

## 2021-12-09 LAB — BPAM RBC
Blood Product Expiration Date: 202311122359
ISSUE DATE / TIME: 202310181445
Unit Type and Rh: 6200

## 2021-12-14 ENCOUNTER — Encounter: Payer: Self-pay | Admitting: Internal Medicine

## 2021-12-14 ENCOUNTER — Inpatient Hospital Stay: Payer: Medicare Other

## 2021-12-14 ENCOUNTER — Inpatient Hospital Stay (HOSPITAL_BASED_OUTPATIENT_CLINIC_OR_DEPARTMENT_OTHER): Payer: Medicare Other | Admitting: Internal Medicine

## 2021-12-14 VITALS — BP 127/77 | HR 67 | Temp 98.0°F | Resp 16 | Wt 101.4 lb

## 2021-12-14 DIAGNOSIS — C3491 Malignant neoplasm of unspecified part of right bronchus or lung: Secondary | ICD-10-CM

## 2021-12-14 DIAGNOSIS — C3490 Malignant neoplasm of unspecified part of unspecified bronchus or lung: Secondary | ICD-10-CM | POA: Diagnosis not present

## 2021-12-14 DIAGNOSIS — Z95828 Presence of other vascular implants and grafts: Secondary | ICD-10-CM

## 2021-12-14 DIAGNOSIS — Z5111 Encounter for antineoplastic chemotherapy: Secondary | ICD-10-CM | POA: Diagnosis not present

## 2021-12-14 LAB — CMP (CANCER CENTER ONLY)
ALT: 9 U/L (ref 0–44)
AST: 18 U/L (ref 15–41)
Albumin: 3.8 g/dL (ref 3.5–5.0)
Alkaline Phosphatase: 107 U/L (ref 38–126)
Anion gap: 6 (ref 5–15)
BUN: 9 mg/dL (ref 8–23)
CO2: 29 mmol/L (ref 22–32)
Calcium: 9.4 mg/dL (ref 8.9–10.3)
Chloride: 104 mmol/L (ref 98–111)
Creatinine: 0.72 mg/dL (ref 0.44–1.00)
GFR, Estimated: >60 mL/min (ref >60)
Glucose, Bld: 87 mg/dL (ref 70–99)
Potassium: 3.5 mmol/L (ref 3.5–5.1)
Sodium: 139 mmol/L (ref 135–145)
Total Bilirubin: 0.4 mg/dL (ref 0.3–1.2)
Total Protein: 7.4 g/dL (ref 6.5–8.1)

## 2021-12-14 LAB — SAMPLE TO BLOOD BANK

## 2021-12-14 LAB — CBC WITH DIFFERENTIAL (CANCER CENTER ONLY)
Abs Immature Granulocytes: 0.02 10*3/uL (ref 0.00–0.07)
Basophils Absolute: 0 10*3/uL (ref 0.0–0.1)
Basophils Relative: 0 %
Eosinophils Absolute: 0.7 10*3/uL — ABNORMAL HIGH (ref 0.0–0.5)
Eosinophils Relative: 10 %
HCT: 32.1 % — ABNORMAL LOW (ref 36.0–46.0)
Hemoglobin: 10.7 g/dL — ABNORMAL LOW (ref 12.0–15.0)
Immature Granulocytes: 0 %
Lymphocytes Relative: 25 %
Lymphs Abs: 1.8 10*3/uL (ref 0.7–4.0)
MCH: 32.7 pg (ref 26.0–34.0)
MCHC: 33.3 g/dL (ref 30.0–36.0)
MCV: 98.2 fL (ref 80.0–100.0)
Monocytes Absolute: 0.8 10*3/uL (ref 0.1–1.0)
Monocytes Relative: 12 %
Neutro Abs: 3.7 10*3/uL (ref 1.7–7.7)
Neutrophils Relative %: 53 %
Platelet Count: 285 10*3/uL (ref 150–400)
RBC: 3.27 MIL/uL — ABNORMAL LOW (ref 3.87–5.11)
RDW: 16.6 % — ABNORMAL HIGH (ref 11.5–15.5)
WBC Count: 7 10*3/uL (ref 4.0–10.5)
nRBC: 0 % (ref 0.0–0.2)

## 2021-12-14 MED ORDER — HEPARIN SOD (PORK) LOCK FLUSH 100 UNIT/ML IV SOLN: 500.0000 [IU] | Freq: Once | INTRAVENOUS | Status: DC | PRN

## 2021-12-14 MED ORDER — CYANOCOBALAMIN 1000 MCG/ML IJ SOLN
1000.0000 ug | Freq: Once | INTRAMUSCULAR | Status: AC
  Administered 2021-12-14: 1000 ug via INTRAMUSCULAR
  Filled 2021-12-14: qty 1

## 2021-12-14 MED ORDER — SODIUM CHLORIDE 0.9 % IV SOLN
500.0000 mg/m2 | Freq: Once | INTRAVENOUS | Status: AC
  Administered 2021-12-14: 700 mg via INTRAVENOUS
  Filled 2021-12-14: qty 20

## 2021-12-14 MED ORDER — SODIUM CHLORIDE 0.9% FLUSH: 10.0000 mL | INTRAVENOUS | Status: DC | PRN

## 2021-12-14 MED ORDER — SODIUM CHLORIDE 0.9 % IV SOLN
200.0000 mg | Freq: Once | INTRAVENOUS | Status: AC
  Administered 2021-12-14: 200 mg via INTRAVENOUS
  Filled 2021-12-14: qty 200

## 2021-12-14 MED ORDER — PROCHLORPERAZINE MALEATE 10 MG PO TABS
10.0000 mg | ORAL_TABLET | Freq: Once | ORAL | Status: AC
  Administered 2021-12-14: 10 mg via ORAL
  Filled 2021-12-14: qty 1

## 2021-12-14 MED ORDER — SODIUM CHLORIDE 0.9 % IV SOLN: Freq: Once | INTRAVENOUS | Status: AC

## 2021-12-14 MED ORDER — SODIUM CHLORIDE 0.9% FLUSH
10.0000 mL | Freq: Once | INTRAVENOUS | Status: AC
  Administered 2021-12-14: 10 mL

## 2021-12-14 NOTE — Progress Notes (Signed)
[facility] Telephone:([phone]   Fax:([phone]  OFFICE PROGRESS NOTE  [name], NP [place] [address]  DIAGNOSIS:  Stage IV (T2a, N0, M1a) non-small cell lung cancer, adenocarcinoma presented with multifocal bilateral pulmonary nodules involving the right upper lobe as well as the smaller bilateral groundglass opacities diagnosed in April 2023.  PD-L1 expression is 4%.  Molecular studies by Guardant 360 tissue test showed positive KRAS G12C mutation but the blood test failed secondary to insufficient circulating tumor DNA.  PRIOR THERAPY: None  CURRENT THERAPY: Systemic chemotherapy with carboplatin for AUC of 5, Alimta 500 Mg/M2 and Keytruda 200 Mg IV every 3 weeks.  First dose 08/10/2021.  Status post 6 cycles.  Starting from cycle #5 the patient is on maintenance treatment with Alimta and Keytruda every 3 weeks.  INTERVAL HISTORY: Sue Chase 66 y.o. female returns to the clinic today for follow-up visit.  The patient is feeling fine today with no concerning complaints except for itching in her eyes.  She tried over-the-counter eyedrop but she could not tolerate it well.  She has tried steroid eyedrops in the past by her ophthalmologist but the patient mentioned that this drove her crazy.  She has no current chest pain, shortness of breath except with exertion with no cough or hemoptysis.  She has no nausea, vomiting, diarrhea or constipation.  She has no headache or visual changes.  She is here today for evaluation before starting cycle #7 of her treatment.  MEDICAL HISTORY: Past Medical History:  Diagnosis Date   Colon polyps    Complication of anesthesia    tolerated propofol but other meds made her emotional / cry/ difficulty breathing/ panic   Hyperlipidemia    Rheumatic fever    Seasonal allergies    UTI (urinary tract infection)     ALLERGIES:  is allergic to codeine.  MEDICATIONS:  Current Outpatient  Medications  Medication Sig Dispense Refill   albuterol (VENTOLIN HFA) 108 (90 Base) MCG/ACT inhaler Inhale 2 puffs into the lungs every 6 (six) hours as needed for wheezing or shortness of breath. 8 g 1   ELIQUIS 5 MG TABS tablet TAKE ONE TABLET BY MOUTH TWICE A DAY 60 tablet 1   folic acid (FOLVITE) 1 MG tablet Take 1 tablet (1 mg total) by mouth daily. 30 tablet 4   lidocaine-prilocaine (EMLA) cream Apply to the Port-A-Cath site 30 minutes before chemotherapy (Patient taking differently: Apply 1 Application topically daily as needed. Apply to the Port-A-Cath site 30 minutes before chemotherapy) 30 g 0   magnesium oxide (MAG-OX) 400 (240 Mg) MG tablet Take 400 mg by mouth daily.     omeprazole (PRILOSEC) 20 MG capsule Take 1 capsule (20 mg total) by mouth daily. (Patient taking differently: Take 20 mg by mouth daily as needed (For heartburn).) 30 capsule 3   potassium chloride SA (KLOR-CON M) 20 MEQ tablet Take 1 tablet (20 mEq total) by mouth daily. 10 tablet 0   prochlorperazine (COMPAZINE) 10 MG tablet Take 1 tablet (10 mg total) by mouth every 6 (six) hours as needed for nausea or vomiting. 30 tablet 0   No current facility-administered medications for this visit.    SURGICAL HISTORY:  Past Surgical History:  Procedure Laterality Date   BRONCHIAL BIOPSY  06/08/2021   Procedure: BRONCHIAL BIOPSIES;  Surgeon: Garner Nash, DO;  Location: [facility];  Service: Pulmonary;;   BRONCHIAL BRUSHINGS  06/08/2021   Procedure:  BRONCHIAL BRUSHINGS;  Surgeon: Garner Nash, DO;  Location: [facility];  Service: Pulmonary;;   BRONCHIAL NEEDLE ASPIRATION BIOPSY  06/08/2021   Procedure: BRONCHIAL NEEDLE ASPIRATION BIOPSIES;  Surgeon: Garner Nash, DO;  Location: [facility];  Service: Pulmonary;;   COLONOSCOPY  2018   ERCP N/A 11/05/2021   Procedure: ENDOSCOPIC RETROGRADE CHOLANGIOPANCREATOGRAPHY (ERCP);  Surgeon: Irene Shipper, MD;  Location: [facility];  Service: Gastroenterology;   Laterality: N/A;   IR IMAGING GUIDED PORT INSERTION  08/12/2021   REMOVAL OF STONES  11/05/2021   Procedure: REMOVAL OF STONES;  Surgeon: Irene Shipper, MD;  Location: [facility];  Service: Gastroenterology;;   SALIVARY GLAND SURGERY     LATE 80S   SPHINCTEROTOMY  11/05/2021   Procedure: SPHINCTEROTOMY;  Surgeon: Irene Shipper, MD;  Location: [facility];  Service: Gastroenterology;;   TUBAL LIGATION  1986   VIDEO BRONCHOSCOPY WITH RADIAL ENDOBRONCHIAL ULTRASOUND  06/08/2021   Procedure: VIDEO BRONCHOSCOPY WITH RADIAL ENDOBRONCHIAL ULTRASOUND;  Surgeon: Garner Nash, DO;  Location: [facility];  Service: Pulmonary;;    REVIEW OF SYSTEMS:  A comprehensive review of systems was negative except for: Eyes: positive for irritation Respiratory: positive for dyspnea on exertion   PHYSICAL EXAMINATION: General appearance: alert, cooperative, fatigued, and no distress Head: Normocephalic, without obvious abnormality, atraumatic Neck: no adenopathy, no JVD, supple, symmetrical, trachea midline, and thyroid not enlarged, symmetric, no tenderness/mass/nodules Lymph nodes: Cervical, supraclavicular, and axillary nodes normal. Resp: clear to auscultation bilaterally Back: symmetric, no curvature. ROM normal. No CVA tenderness. Cardio: regular rate and rhythm, S1, S2 normal, no murmur, click, rub or gallop GI: soft, non-tender; bowel sounds normal; no masses,  no organomegaly Extremities: extremities normal, atraumatic, no cyanosis or edema  ECOG PERFORMANCE STATUS: 1 - Symptomatic but completely ambulatory  Blood pressure 127/77, pulse 67, temperature 98 F (36.7 C), temperature source Oral, resp. rate 16, weight 101 lb 6.4 oz (46 kg), SpO2 97 %.  LABORATORY DATA: Lab Results  Component Value Date   WBC 7.0 12/14/2021   HGB 10.7 (L) 12/14/2021   HCT 32.1 (L) 12/14/2021   MCV 98.2 12/14/2021   PLT 285 12/14/2021      Chemistry      Component Value Date/Time   NA 139 12/14/2021 1059    K 3.5 12/14/2021 1059   CL 104 12/14/2021 1059   CO2 29 12/14/2021 1059   BUN 9 12/14/2021 1059   CREATININE 0.72 12/14/2021 1059      Component Value Date/Time   CALCIUM 9.4 12/14/2021 1059   ALKPHOS 107 12/14/2021 1059   AST 18 12/14/2021 1059   ALT 9 12/14/2021 1059   BILITOT 0.4 12/14/2021 1059       RADIOGRAPHIC STUDIES: No results found.  ASSESSMENT AND PLAN: This is a very pleasant 66 years old white female diagnosed with stage IV (T2 a, N0, M1 a) non-small cell lung cancer, adenocarcinoma presented with multifocal bilateral pulmonary nodules involving the right upper lobe as well as the smaller bilateral groundglass opacities diagnosed in April 2023 with positive KRAS G12C mutation and PD-L1 expression of 4%. She is currently undergoing systemic chemotherapy with carboplatin for AUC of 5, pemetrexed 500 Mg/M2 and Keytruda 200 Mg IV every 3 weeks status post 6 cycles.  Starting from cycle #5 the patient is on maintenance treatment with Alimta and Keytruda every 3 weeks. The patient has been tolerating this treatment well with no concerning adverse effect except for mild fatigue and shortness of breath with exertion.  She also has some itching in her eyes. I recommended for her to proceed with cycle #7 today as planned. I will see her back for follow-up visit in 3 weeks for evaluation with repeat CT scan of the chest, abdomen and pelvis for restaging of her disease. For the eye irritation, she will continue with over-the-counter eyedrops in addition to Benadryl or Claritin. The patient was advised to call immediately if she has any other concerning symptoms in the interval. The patient voices understanding of current disease status and treatment options and is in agreement with the current care plan. All questions were answered. The patient knows to call the clinic with any problems, questions or concerns. We can certainly see the patient much sooner if necessary.  The total  time spent in the appointment was 20 minutes.  Disclaimer: This note was dictated with voice recognition software. Similar sounding words can inadvertently be transcribed and may not be corrected upon review.

## 2021-12-14 NOTE — Patient Instructions (Signed)
[facility]COL[facility]  Discharge Instructions: Thank you for choosing [facility] to provide your oncology and hematology care.   If you have a lab appointment with the [facility], please go directly to the [facility] and check in at the registration area.   Wear comfortable clothing and clothing appropriate for easy access to any Portacath or PICC line.   We strive to give you quality time with your provider. You may need to reschedule your appointment if you arrive late (15 or more minutes).  Arriving late affects you and other patients whose appointments are after yours.  Also, if you miss three or more appointments without notifying the office, you may be dismissed from the clinic at the provider's discretion.      For prescription refill requests, have your pharmacy contact our office and allow 72 hours for refills to be completed.    Today you received the following chemotherapy and/or immunotherapy agents keytruda, alimta      To help prevent nausea and vomiting after your treatment, we encourage you to take your nausea medication as directed.  BELOW ARE SYMPTOMS THAT SHOULD BE REPORTED IMMEDIATELY: *FEVER GREATER THAN 100.4 F (38 C) OR HIGHER *CHILLS OR SWEATING *NAUSEA AND VOMITING THAT IS NOT CONTROLLED WITH YOUR NAUSEA MEDICATION *UNUSUAL SHORTNESS OF BREATH *UNUSUAL BRUISING OR BLEEDING *URINARY PROBLEMS (pain or burning when urinating, or frequent urination) *BOWEL PROBLEMS (unusual diarrhea, constipation, pain near the anus) TENDERNESS IN MOUTH AND THROAT WITH OR WITHOUT PRESENCE OF ULCERS (sore throat, sores in mouth, or a toothache) UNUSUAL RASH, SWELLING OR PAIN  UNUSUAL VAGINAL DISCHARGE OR ITCHING   Items with * indicate a potential emergency and should be followed up as soon as possible or go to the Emergency Department if any problems should occur.  Please show the CHEMOTHERAPY ALERT CARD or IMMUNOTHERAPY ALERT CARD at  check-in to the Emergency Department and triage nurse.  Should you have questions after your visit or need to cancel or reschedule your appointment, please contact [facility]  Dept: [phone]  and follow the prompts.  Office hours are 8:00 a.m. to 4:30 p.m. Monday - Friday. Please note that voicemails left after 4:00 p.m. may not be returned until the following business day.  We are closed weekends and major holidays. You have access to a nurse at all times for urgent questions. Please call the main number to the clinic Dept: [phone] and follow the prompts.   For any non-urgent questions, you may also contact your provider using MyChart. We now offer e-Visits for anyone 71 and older to request care online for non-urgent symptoms. For details visit [email or web address].   Also download the MyChart app! Go to the app store, search "MyChart", open the app, select [facility], and log in with your MyChart username and password.  Masks are optional in the cancer centers. If you would like for your care team to wear a mask while they are taking care of you, please let them know. You may have one support person who is at least 66 years old accompany you for your appointments.

## 2021-12-21 ENCOUNTER — Other Ambulatory Visit: Payer: BC Managed Care – PPO

## 2021-12-30 ENCOUNTER — Ambulatory Visit (HOSPITAL_COMMUNITY)
Admission: RE | Admit: 2021-12-30 | Discharge: 2021-12-30 | Disposition: A | Discharge status: Home / Self Care | Payer: Medicare Other | Source: Ambulatory Visit | Attending: Internal Medicine | Admitting: Internal Medicine

## 2021-12-30 DIAGNOSIS — C3490 Malignant neoplasm of unspecified part of unspecified bronchus or lung: Secondary | ICD-10-CM | POA: Diagnosis present

## 2021-12-30 MED ORDER — IOHEXOL 9 MG/ML PO SOLN
ORAL | Status: AC
  Filled 2021-12-30: qty 1000

## 2021-12-30 MED ORDER — IOHEXOL 300 MG/ML  SOLN
80.0000 mL | Freq: Once | INTRAMUSCULAR | Status: AC | PRN
  Administered 2021-12-30: 80 mL via INTRAVENOUS

## 2021-12-30 MED ORDER — SODIUM CHLORIDE (PF) 0.9 % IJ SOLN
INTRAMUSCULAR | Status: AC
  Filled 2021-12-30: qty 50

## 2022-01-01 NOTE — Progress Notes (Unsigned)
[name] OFFICE PROGRESS NOTE  Sue Peng, NP [place] [address]  DIAGNOSIS: Stage IV (T2a, N0, M1a) non-small cell lung cancer, adenocarcinoma presented with multifocal bilateral pulmonary nodules involving the right upper lobe as well as the smaller bilateral groundglass opacities diagnosed in April 2023.  PD-L1 expression is 4%.   Molecular studies by Guardant 360 tissue test showed positive KRAS G12C mutation but the blood test failed secondary to insufficient circulating tumor DNA.  PRIOR THERAPY: None  CURRENT THERAPY: Systemic chemotherapy with carboplatin for AUC of 5, Alimta 500 Mg/M2 and Keytruda 200 Mg IV every 3 weeks.  First dose 08/10/2021. Starting from cycle #5, the patient started maintenance Alimta and Keytruda.  Status post 6 cycles.   INTERVAL HISTORY: Sue Chase 66 y.o. female returns  to the clinic today for follow-up visit.  The patient is currently undergoing chemotherapy and immunotherapy.  She tolerates this fairly well.   She reports stable fatigue.  Today she denies any fever, chills, or night sweats. She reports *** lbs of weight loss during her hospitalization. Her appetite is coming back. Denies any abdominal pain, jaundice, or itching.  Denies any nausea, vomiting, diarrhea, or constipation.  She reports mild baseline dyspnea only with exertion. She states she does not have a significant cough. Denies any hemoptysis or chest pain.  Denies any headache or visual changes.  Denies any rashes or skin changes. She recently had a restaging CT scan performed. She is here for evaluation and to review her scan results before starting cycle #7.       MEDICAL HISTORY: Past Medical History:  Diagnosis Date   Colon polyps    Complication of anesthesia    tolerated propofol but other meds made her emotional / cry/ difficulty breathing/ panic   Hyperlipidemia    Rheumatic fever    Seasonal allergies    UTI  (urinary tract infection)     ALLERGIES:  is allergic to codeine.  MEDICATIONS:  Current Outpatient Medications  Medication Sig Dispense Refill   albuterol (VENTOLIN HFA) 108 (90 Base) MCG/ACT inhaler Inhale 2 puffs into the lungs every 6 (six) hours as needed for wheezing or shortness of breath. (Patient not taking: Reported on 12/14/2021) 8 g 1   ELIQUIS 5 MG TABS tablet TAKE ONE TABLET BY MOUTH TWICE A DAY 60 tablet 1   folic acid (FOLVITE) 1 MG tablet Take 1 tablet (1 mg total) by mouth daily. 30 tablet 4   lidocaine-prilocaine (EMLA) cream Apply to the Port-A-Cath site 30 minutes before chemotherapy (Patient taking differently: Apply 1 Application topically daily as needed. Apply to the Port-A-Cath site 30 minutes before chemotherapy) 30 g 0   magnesium oxide (MAG-OX) 400 (240 Mg) MG tablet Take 400 mg by mouth daily.     nicotine (NICODERM CQ - DOSED IN MG/24 HOURS) 14 mg/24hr patch 14 mg daily.     omeprazole (PRILOSEC) 20 MG capsule Take 1 capsule (20 mg total) by mouth daily. (Patient not taking: Reported on 12/14/2021) 30 capsule 3   prochlorperazine (COMPAZINE) 10 MG tablet Take 1 tablet (10 mg total) by mouth every 6 (six) hours as needed for nausea or vomiting. (Patient not taking: Reported on 12/14/2021) 30 tablet 0   No current facility-administered medications for this visit.    SURGICAL HISTORY:  Past Surgical History:  Procedure Laterality Date   BRONCHIAL BIOPSY  06/08/2021   Procedure: BRONCHIAL BIOPSIES;  Surgeon: Garner Nash, DO;  Location: [facility];  Service: Pulmonary;;   BRONCHIAL BRUSHINGS  06/08/2021   Procedure: BRONCHIAL BRUSHINGS;  Surgeon: Garner Nash, DO;  Location: [facility];  Service: Pulmonary;;   BRONCHIAL NEEDLE ASPIRATION BIOPSY  06/08/2021   Procedure: BRONCHIAL NEEDLE ASPIRATION BIOPSIES;  Surgeon: Garner Nash, DO;  Location: [facility];  Service: Pulmonary;;   COLONOSCOPY  2018   ERCP N/A 11/05/2021   Procedure: ENDOSCOPIC  RETROGRADE CHOLANGIOPANCREATOGRAPHY (ERCP);  Surgeon: Irene Shipper, MD;  Location: [facility];  Service: Gastroenterology;  Laterality: N/A;   IR IMAGING GUIDED PORT INSERTION  08/12/2021   REMOVAL OF STONES  11/05/2021   Procedure: REMOVAL OF STONES;  Surgeon: Irene Shipper, MD;  Location: [facility];  Service: Gastroenterology;;   SALIVARY GLAND SURGERY     LATE 80S   SPHINCTEROTOMY  11/05/2021   Procedure: SPHINCTEROTOMY;  Surgeon: Irene Shipper, MD;  Location: [facility];  Service: Gastroenterology;;   TUBAL LIGATION  1986   VIDEO BRONCHOSCOPY WITH RADIAL ENDOBRONCHIAL ULTRASOUND  06/08/2021   Procedure: VIDEO BRONCHOSCOPY WITH RADIAL ENDOBRONCHIAL ULTRASOUND;  Surgeon: Garner Nash, DO;  Location: [facility];  Service: Pulmonary;;    REVIEW OF SYSTEMS:   Review of Systems  Constitutional: Negative for appetite change, chills, fatigue, fever and unexpected weight change.  HENT:   Negative for mouth sores, nosebleeds, sore throat and trouble swallowing.   Eyes: Negative for eye problems and icterus.  Respiratory: Negative for cough, hemoptysis, shortness of breath and wheezing.   Cardiovascular: Negative for chest pain and leg swelling.  Gastrointestinal: Negative for abdominal pain, constipation, diarrhea, nausea and vomiting.  Genitourinary: Negative for bladder incontinence, difficulty urinating, dysuria, frequency and hematuria.   Musculoskeletal: Negative for back pain, gait problem, neck pain and neck stiffness.  Skin: Negative for itching and rash.  Neurological: Negative for dizziness, extremity weakness, gait problem, headaches, light-headedness and seizures.  Hematological: Negative for adenopathy. Does not bruise/bleed easily.  Psychiatric/Behavioral: Negative for confusion, depression and sleep disturbance. The patient is not nervous/anxious.     PHYSICAL EXAMINATION:  There were no vitals taken for this visit.  ECOG PERFORMANCE STATUS: {CHL ONC ECOG  [id number]  Physical Exam  Constitutional: Oriented to person, place, and time and well-developed, well-nourished, and in no distress. No distress.  HENT:  Head: Normocephalic and atraumatic.  Mouth/Throat: Oropharynx is clear and moist. No oropharyngeal exudate.  Eyes: Conjunctivae are normal. Right eye exhibits no discharge. Left eye exhibits no discharge. No scleral icterus.  Neck: Normal range of motion. Neck supple.  Cardiovascular: Normal rate, regular rhythm, normal heart sounds and intact distal pulses.   Pulmonary/Chest: Effort normal and breath sounds normal. No respiratory distress. No wheezes. No rales.  Abdominal: Soft. Bowel sounds are normal. Exhibits no distension and no mass. There is no tenderness.  Musculoskeletal: Normal range of motion. Exhibits no edema.  Lymphadenopathy:    No cervical adenopathy.  Neurological: Alert and oriented to person, place, and time. Exhibits normal muscle tone. Gait normal. Coordination normal.  Skin: Skin is warm and dry. No rash noted. Not diaphoretic. No erythema. No pallor.  Psychiatric: Mood, memory and judgment normal.  Vitals reviewed.  LABORATORY DATA: Lab Results  Component Value Date   WBC 7.0 12/14/2021   HGB 10.7 (L) 12/14/2021   HCT 32.1 (L) 12/14/2021   MCV 98.2 12/14/2021   PLT 285 12/14/2021      Chemistry      Component Value Date/Time   NA 139 12/14/2021 1059   K 3.5 12/14/2021 1059  CL 104 12/14/2021 1059   CO2 29 12/14/2021 1059   BUN 9 12/14/2021 1059   CREATININE 0.72 12/14/2021 1059      Component Value Date/Time   CALCIUM 9.4 12/14/2021 1059   ALKPHOS 107 12/14/2021 1059   AST 18 12/14/2021 1059   ALT 9 12/14/2021 1059   BILITOT 0.4 12/14/2021 1059       RADIOGRAPHIC STUDIES:  No results found.   ASSESSMENT/PLAN:  This very pleasant 66 year old Caucasian female diagnosed with stage IV (T2 a, N0, M1 a) non-small cell lung cancer, adenocarcinoma.  The patient presented multifocal  bilateral pulmonary nodules involving the right upper lobe as well as small bilateral groundglass opacities.  She was diagnosed in April 2023.  She is positive for a K-ras G12 C mutation and her PD-L1 expression of 4%.  Her K-ras G12 C mutation can be targeted in the second line setting in the future.    The patient is currently undergoing systemic chemotherapy with carboplatin for an AUC of 5, Alimta 500 mg per metered square, Keytruda 200 mg IV every 3 weeks.  She is status post 6 cycles.  She tolerates this fairly well. Starting from cycle #5, she started maintenance alimta and [place].    The patient was seen with Dr. Julien Nordmann today. Labs were reviewed. Recommend that she proceed with cycle #5 today. She is starting maintenance Alimta and Keytruda starting from today.   She recently had a restaging CT scan performed. Dr. Julien Nordmann personally and independently reviewed the scan and discussed the results with the patient. The scan showed ***. Dr. Julien Nordmann recommends that she *** with  cycle #7 today as scheduled.   The patient was advised to call immediately if she has any concerning symptoms in the interval. The patient voices understanding of current disease status and treatment options and is in agreement with the current care plan. All questions were answered. The patient knows to call the clinic with any problems, questions or concerns. We can certainly see the patient much sooner if necessary          No orders of the defined types were placed in this encounter.    I spent {CHL ONC TIME VISIT - TCYEL:[id number]} counseling the patient face to face. The total time spent in the appointment was {CHL ONC TIME VISIT - KKOEC:[id number]}.  Ginamarie Banfield L Jacorey Donaway, PA-C 01/01/22

## 2022-01-05 ENCOUNTER — Inpatient Hospital Stay: Payer: Medicare Other

## 2022-01-05 ENCOUNTER — Inpatient Hospital Stay: Payer: Medicare Other | Attending: Oncology | Admitting: Physician Assistant

## 2022-01-05 ENCOUNTER — Telehealth: Payer: Self-pay | Admitting: Internal Medicine

## 2022-01-05 ENCOUNTER — Other Ambulatory Visit: Payer: Medicare Other

## 2022-01-05 VITALS — BP 145/87 | HR 78 | Temp 98.1°F | Resp 15 | Ht 63.0 in | Wt 101.4 lb

## 2022-01-05 VITALS — BP 125/57 | HR 76 | Temp 97.7°F | Resp 18

## 2022-01-05 DIAGNOSIS — Z5112 Encounter for antineoplastic immunotherapy: Secondary | ICD-10-CM | POA: Insufficient documentation

## 2022-01-05 DIAGNOSIS — C3491 Malignant neoplasm of unspecified part of right bronchus or lung: Secondary | ICD-10-CM

## 2022-01-05 DIAGNOSIS — F17210 Nicotine dependence, cigarettes, uncomplicated: Secondary | ICD-10-CM | POA: Diagnosis not present

## 2022-01-05 DIAGNOSIS — C3411 Malignant neoplasm of upper lobe, right bronchus or lung: Secondary | ICD-10-CM | POA: Diagnosis not present

## 2022-01-05 DIAGNOSIS — E876 Hypokalemia: Secondary | ICD-10-CM

## 2022-01-05 DIAGNOSIS — Z5111 Encounter for antineoplastic chemotherapy: Secondary | ICD-10-CM | POA: Diagnosis not present

## 2022-01-05 DIAGNOSIS — Z95828 Presence of other vascular implants and grafts: Secondary | ICD-10-CM

## 2022-01-05 DIAGNOSIS — Z79899 Other long term (current) drug therapy: Secondary | ICD-10-CM | POA: Diagnosis not present

## 2022-01-05 LAB — COMPREHENSIVE METABOLIC PANEL
ALT: 7 U/L (ref 0–44)
AST: 17 U/L (ref 15–41)
Albumin: 4 g/dL (ref 3.5–5.0)
Alkaline Phosphatase: 87 U/L (ref 38–126)
Anion gap: 8 (ref 5–15)
BUN: 9 mg/dL (ref 8–23)
CO2: 28 mmol/L (ref 22–32)
Calcium: 9.4 mg/dL (ref 8.9–10.3)
Chloride: 104 mmol/L (ref 98–111)
Creatinine, Ser: 0.93 mg/dL (ref 0.44–1.00)
GFR, Estimated: >60 mL/min (ref >60)
Glucose, Bld: 87 mg/dL (ref 70–99)
Potassium: 3.2 mmol/L — ABNORMAL LOW (ref 3.5–5.1)
Sodium: 140 mmol/L (ref 135–145)
Total Bilirubin: 0.3 mg/dL (ref 0.3–1.2)
Total Protein: 7.5 g/dL (ref 6.5–8.1)

## 2022-01-05 LAB — CBC WITH DIFFERENTIAL (CANCER CENTER ONLY)
Abs Immature Granulocytes: 0.03 10*3/uL (ref 0.00–0.07)
Basophils Absolute: 0 10*3/uL (ref 0.0–0.1)
Basophils Relative: 1 %
Eosinophils Absolute: 1 10*3/uL — ABNORMAL HIGH (ref 0.0–0.5)
Eosinophils Relative: 12 %
HCT: 31.4 % — ABNORMAL LOW (ref 36.0–46.0)
Hemoglobin: 10.5 g/dL — ABNORMAL LOW (ref 12.0–15.0)
Immature Granulocytes: 0 %
Lymphocytes Relative: 21 %
Lymphs Abs: 1.8 10*3/uL (ref 0.7–4.0)
MCH: 32.4 pg (ref 26.0–34.0)
MCHC: 33.4 g/dL (ref 30.0–36.0)
MCV: 96.9 fL (ref 80.0–100.0)
Monocytes Absolute: 1.1 10*3/uL — ABNORMAL HIGH (ref 0.1–1.0)
Monocytes Relative: 13 %
Neutro Abs: 4.6 10*3/uL (ref 1.7–7.7)
Neutrophils Relative %: 53 %
Platelet Count: 264 10*3/uL (ref 150–400)
RBC: 3.24 MIL/uL — ABNORMAL LOW (ref 3.87–5.11)
RDW: 15.6 % — ABNORMAL HIGH (ref 11.5–15.5)
WBC Count: 8.6 10*3/uL (ref 4.0–10.5)
nRBC: 0 % (ref 0.0–0.2)

## 2022-01-05 LAB — SAMPLE TO BLOOD BANK

## 2022-01-05 LAB — TSH: TSH: 4.211 u[IU]/mL (ref 0.350–4.500)

## 2022-01-05 MED ORDER — SODIUM CHLORIDE 0.9 % IV SOLN
200.0000 mg | Freq: Once | INTRAVENOUS | Status: AC
  Administered 2022-01-05: 200 mg via INTRAVENOUS
  Filled 2022-01-05: qty 200

## 2022-01-05 MED ORDER — SODIUM CHLORIDE 0.9 % IV SOLN
500.0000 mg/m2 | Freq: Once | INTRAVENOUS | Status: AC
  Administered 2022-01-05: 700 mg via INTRAVENOUS
  Filled 2022-01-05: qty 20

## 2022-01-05 MED ORDER — POTASSIUM CHLORIDE CRYS ER 20 MEQ PO TBCR: 20.0000 meq | EXTENDED_RELEASE_TABLET | Freq: Every day | ORAL | 0 refills | Status: DC

## 2022-01-05 MED ORDER — HEPARIN SOD (PORK) LOCK FLUSH 100 UNIT/ML IV SOLN
500.0000 [IU] | Freq: Once | INTRAVENOUS | Status: AC | PRN
  Administered 2022-01-05: 500 [IU]

## 2022-01-05 MED ORDER — SODIUM CHLORIDE 0.9% FLUSH
10.0000 mL | Freq: Once | INTRAVENOUS | Status: AC
  Administered 2022-01-05: 10 mL

## 2022-01-05 MED ORDER — SODIUM CHLORIDE 0.9% FLUSH
10.0000 mL | INTRAVENOUS | Status: DC | PRN
  Administered 2022-01-05: 10 mL

## 2022-01-05 MED ORDER — SODIUM CHLORIDE 0.9 % IV SOLN: Freq: Once | INTRAVENOUS | Status: AC

## 2022-01-05 MED ORDER — PROCHLORPERAZINE MALEATE 10 MG PO TABS
10.0000 mg | ORAL_TABLET | Freq: Once | ORAL | Status: AC
  Administered 2022-01-05: 10 mg via ORAL
  Filled 2022-01-05: qty 1

## 2022-01-05 NOTE — Patient Instructions (Signed)
[facility] ONCOLOG[facility]  Discharge Instructions: Thank you for choosing [facility] to provide your oncology and hematology care.   If you have a lab appointment with the [facility], please go directly to the [facility] and check in at the registration area.   Wear comfortable clothing and clothing appropriate for easy access to any Portacath or PICC line.   We strive to give you quality time with your provider. You may need to reschedule your appointment if you arrive late (15 or more minutes).  Arriving late affects you and other patients whose appointments are after yours.  Also, if you miss three or more appointments without notifying the office, you may be dismissed from the clinic at the provider's discretion.      For prescription refill requests, have your pharmacy contact our office and allow 72 hours for refills to be completed.    Today you received the following chemotherapy and/or immunotherapy agents: Keytruda and Alimta      To help prevent nausea and vomiting after your treatment, we encourage you to take your nausea medication as directed.  BELOW ARE SYMPTOMS THAT SHOULD BE REPORTED IMMEDIATELY: *FEVER GREATER THAN 100.4 F (38 C) OR HIGHER *CHILLS OR SWEATING *NAUSEA AND VOMITING THAT IS NOT CONTROLLED WITH YOUR NAUSEA MEDICATION *UNUSUAL SHORTNESS OF BREATH *UNUSUAL BRUISING OR BLEEDING *URINARY PROBLEMS (pain or burning when urinating, or frequent urination) *BOWEL PROBLEMS (unusual diarrhea, constipation, pain near the anus) TENDERNESS IN MOUTH AND THROAT WITH OR WITHOUT PRESENCE OF ULCERS (sore throat, sores in mouth, or a toothache) UNUSUAL RASH, SWELLING OR PAIN  UNUSUAL VAGINAL DISCHARGE OR ITCHING   Items with * indicate a potential emergency and should be followed up as soon as possible or go to the Emergency Department if any problems should occur.  Please show the CHEMOTHERAPY ALERT CARD or IMMUNOTHERAPY ALERT CARD at  check-in to the Emergency Department and triage nurse.  Should you have questions after your visit or need to cancel or reschedule your appointment, please contact [facility]  Dept: [phone]  and follow the prompts.  Office hours are 8:00 a.m. to 4:30 p.m. Monday - Friday. Please note that voicemails left after 4:00 p.m. may not be returned until the following business day.  We are closed weekends and major holidays. You have access to a nurse at all times for urgent questions. Please call the main number to the clinic Dept: [phone] and follow the prompts.   For any non-urgent questions, you may also contact your provider using MyChart. We now offer e-Visits for anyone 57 and older to request care online for non-urgent symptoms. For details visit [email or web address].   Also download the MyChart app! Go to the app store, search "MyChart", open the app, select , and log in with your MyChart username and password.  Masks are optional in the cancer centers. If you would like for your care team to wear a mask while they are taking care of you, please let them know. You may have one support person who is at least 66 years old accompany you for your appointments.

## 2022-01-05 NOTE — Telephone Encounter (Signed)
Called patient regarding December appointments, left a voicemail.

## 2022-01-06 ENCOUNTER — Other Ambulatory Visit: Payer: Self-pay

## 2022-01-07 ENCOUNTER — Telehealth: Payer: Self-pay | Admitting: Pulmonary Disease

## 2022-01-07 NOTE — Telephone Encounter (Signed)
Called and spoke with patient. She stated that she was advised to contact our office due to the albuterol no longer working for her. She confirmed that this is the only inhaler she has. She has been more SOB especially with exertion, increased wheezing. She denied any change in her cough.   She was last seen back in April and was told to follow up only as needed. She is aware that she may be asked to make an appointment. She wanted to know if Dr. Valeta Harms would send in a stronger inhaler.   Dr. Valeta Harms, can you please advise? Thanks!

## 2022-01-10 MED ORDER — TRELEGY ELLIPTA 100-62.5-25 MCG/ACT IN AEPB: 1.0000 | INHALATION_SPRAY | Freq: Every day | RESPIRATORY_TRACT | 3 refills | Status: DC

## 2022-01-10 NOTE — Telephone Encounter (Signed)
Spoke with pt and notified her of Trelegy order and reviewed instructions on how to use inhaler. Pt stated understanding. Trelegy order placed. Nothing further needed at this time.

## 2022-01-14 ENCOUNTER — Other Ambulatory Visit: Payer: Self-pay | Admitting: Adult Health

## 2022-01-14 ENCOUNTER — Other Ambulatory Visit: Payer: Self-pay | Admitting: Internal Medicine

## 2022-01-17 NOTE — Telephone Encounter (Signed)
Pt called to FU on her refill request for the: ELIQUIS 5 MG TABS tablet   [facility] [id number] - [place] [address], [place] Phone: [phone]  Fax: [phone]

## 2022-01-20 ENCOUNTER — Other Ambulatory Visit: Payer: Self-pay

## 2022-01-26 ENCOUNTER — Telehealth: Payer: Self-pay | Admitting: Pulmonary Disease

## 2022-01-26 ENCOUNTER — Inpatient Hospital Stay: Payer: Medicare Other

## 2022-01-26 ENCOUNTER — Inpatient Hospital Stay (HOSPITAL_BASED_OUTPATIENT_CLINIC_OR_DEPARTMENT_OTHER): Payer: Medicare Other | Admitting: Internal Medicine

## 2022-01-26 ENCOUNTER — Encounter: Payer: Self-pay | Admitting: Internal Medicine

## 2022-01-26 ENCOUNTER — Inpatient Hospital Stay: Payer: Medicare Other | Attending: Oncology

## 2022-01-26 VITALS — BP 143/82 | HR 80 | Temp 97.8°F | Resp 18 | Wt 98.8 lb

## 2022-01-26 DIAGNOSIS — Z5111 Encounter for antineoplastic chemotherapy: Secondary | ICD-10-CM | POA: Insufficient documentation

## 2022-01-26 DIAGNOSIS — C3491 Malignant neoplasm of unspecified part of right bronchus or lung: Secondary | ICD-10-CM | POA: Diagnosis not present

## 2022-01-26 DIAGNOSIS — D649 Anemia, unspecified: Secondary | ICD-10-CM | POA: Diagnosis not present

## 2022-01-26 DIAGNOSIS — Z79899 Other long term (current) drug therapy: Secondary | ICD-10-CM | POA: Diagnosis not present

## 2022-01-26 DIAGNOSIS — R0981 Nasal congestion: Secondary | ICD-10-CM

## 2022-01-26 DIAGNOSIS — Z5112 Encounter for antineoplastic immunotherapy: Secondary | ICD-10-CM | POA: Insufficient documentation

## 2022-01-26 DIAGNOSIS — Z95828 Presence of other vascular implants and grafts: Secondary | ICD-10-CM

## 2022-01-26 DIAGNOSIS — C3411 Malignant neoplasm of upper lobe, right bronchus or lung: Secondary | ICD-10-CM | POA: Diagnosis not present

## 2022-01-26 LAB — TSH: TSH: 2.153 u[IU]/mL (ref 0.350–4.500)

## 2022-01-26 LAB — CMP (CANCER CENTER ONLY)
ALT: 7 U/L (ref 0–44)
AST: 18 U/L (ref 15–41)
Albumin: 3.9 g/dL (ref 3.5–5.0)
Alkaline Phosphatase: 84 U/L (ref 38–126)
Anion gap: 6 (ref 5–15)
BUN: 12 mg/dL (ref 8–23)
CO2: 29 mmol/L (ref 22–32)
Calcium: 9.9 mg/dL (ref 8.9–10.3)
Chloride: 103 mmol/L (ref 98–111)
Creatinine: 0.94 mg/dL (ref 0.44–1.00)
GFR, Estimated: >60 mL/min (ref >60)
Glucose, Bld: 92 mg/dL (ref 70–99)
Potassium: 3.4 mmol/L — ABNORMAL LOW (ref 3.5–5.1)
Sodium: 138 mmol/L (ref 135–145)
Total Bilirubin: 0.3 mg/dL (ref 0.3–1.2)
Total Protein: 7.5 g/dL (ref 6.5–8.1)

## 2022-01-26 LAB — CBC WITH DIFFERENTIAL (CANCER CENTER ONLY)
Abs Immature Granulocytes: 0.03 10*3/uL (ref 0.00–0.07)
Basophils Absolute: 0.1 10*3/uL (ref 0.0–0.1)
Basophils Relative: 1 %
Eosinophils Absolute: 0.7 10*3/uL — ABNORMAL HIGH (ref 0.0–0.5)
Eosinophils Relative: 8 %
HCT: 29.7 % — ABNORMAL LOW (ref 36.0–46.0)
Hemoglobin: 10.1 g/dL — ABNORMAL LOW (ref 12.0–15.0)
Immature Granulocytes: 0 %
Lymphocytes Relative: 25 %
Lymphs Abs: 2.1 10*3/uL (ref 0.7–4.0)
MCH: 32.9 pg (ref 26.0–34.0)
MCHC: 34 g/dL (ref 30.0–36.0)
MCV: 96.7 fL (ref 80.0–100.0)
Monocytes Absolute: 1.3 10*3/uL — ABNORMAL HIGH (ref 0.1–1.0)
Monocytes Relative: 15 %
Neutro Abs: 4.4 10*3/uL (ref 1.7–7.7)
Neutrophils Relative %: 51 %
Platelet Count: 328 10*3/uL (ref 150–400)
RBC: 3.07 MIL/uL — ABNORMAL LOW (ref 3.87–5.11)
RDW: 15.4 % (ref 11.5–15.5)
WBC Count: 8.5 10*3/uL (ref 4.0–10.5)
nRBC: 0 % (ref 0.0–0.2)

## 2022-01-26 MED ORDER — SODIUM CHLORIDE 0.9 % IV SOLN
500.0000 mg/m2 | Freq: Once | INTRAVENOUS | Status: AC
  Administered 2022-01-26: 700 mg via INTRAVENOUS
  Filled 2022-01-26: qty 20

## 2022-01-26 MED ORDER — SODIUM CHLORIDE 0.9% FLUSH
10.0000 mL | Freq: Once | INTRAVENOUS | Status: AC
  Administered 2022-01-26: 10 mL

## 2022-01-26 MED ORDER — SODIUM CHLORIDE 0.9% FLUSH
10.0000 mL | INTRAVENOUS | Status: DC | PRN
  Administered 2022-01-26: 10 mL

## 2022-01-26 MED ORDER — SODIUM CHLORIDE 0.9 % IV SOLN: Freq: Once | INTRAVENOUS | Status: AC

## 2022-01-26 MED ORDER — HEPARIN SOD (PORK) LOCK FLUSH 100 UNIT/ML IV SOLN
500.0000 [IU] | Freq: Once | INTRAVENOUS | Status: AC | PRN
  Administered 2022-01-26: 500 [IU]

## 2022-01-26 MED ORDER — PROCHLORPERAZINE MALEATE 10 MG PO TABS
10.0000 mg | ORAL_TABLET | Freq: Once | ORAL | Status: AC
  Administered 2022-01-26: 10 mg via ORAL
  Filled 2022-01-26: qty 1

## 2022-01-26 MED ORDER — SODIUM CHLORIDE 0.9 % IV SOLN
200.0000 mg | Freq: Once | INTRAVENOUS | Status: AC
  Administered 2022-01-26: 200 mg via INTRAVENOUS
  Filled 2022-01-26: qty 200

## 2022-01-26 NOTE — Patient Instructions (Signed)
[facility]  Discharge Instructions: Thank you for choosing [facility] to provide your oncology and hematology care.   If you have a lab appointment with the [facility], please go directly to the [facility] and check in at the registration area.   Wear comfortable clothing and clothing appropriate for easy access to any Portacath or PICC line.   We strive to give you quality time with your provider. You may need to reschedule your appointment if you arrive late (15 or more minutes).  Arriving late affects you and other patients whose appointments are after yours.  Also, if you miss three or more appointments without notifying the office, you may be dismissed from the clinic at the provider's discretion.      For prescription refill requests, have your pharmacy contact our office and allow 72 hours for refills to be completed.    Today you received the following chemotherapy and/or immunotherapy agents: Keytruda and Alimta      To help prevent nausea and vomiting after your treatment, we encourage you to take your nausea medication as directed.  BELOW ARE SYMPTOMS THAT SHOULD BE REPORTED IMMEDIATELY: *FEVER GREATER THAN 100.4 F (38 C) OR HIGHER *CHILLS OR SWEATING *NAUSEA AND VOMITING THAT IS NOT CONTROLLED WITH YOUR NAUSEA MEDICATION *UNUSUAL SHORTNESS OF BREATH *UNUSUAL BRUISING OR BLEEDING *URINARY PROBLEMS (pain or burning when urinating, or frequent urination) *BOWEL PROBLEMS (unusual diarrhea, constipation, pain near the anus) TENDERNESS IN MOUTH AND THROAT WITH OR WITHOUT PRESENCE OF ULCERS (sore throat, sores in mouth, or a toothache) UNUSUAL RASH, SWELLING OR PAIN  UNUSUAL VAGINAL DISCHARGE OR ITCHING   Items with * indicate a potential emergency and should be followed up as soon as possible or go to the Emergency Department if any problems should occur.  Please show the CHEMOTHERAPY ALERT CARD or IMMUNOTHERAPY ALERT CARD at  check-in to the Emergency Department and triage nurse.  Should you have questions after your visit or need to cancel or reschedule your appointment, please contact [facility]  Dept: [phone]  and follow the prompts.  Office hours are 8:00 a.m. to 4:30 p.m. Monday - Friday. Please note that voicemails left after 4:00 p.m. may not be returned until the following business day.  We are closed weekends and major holidays. You have access to a nurse at all times for urgent questions. Please call the main number to the clinic Dept: [phone] and follow the prompts.   For any non-urgent questions, you may also contact your provider using MyChart. We now offer e-Visits for anyone 29 and older to request care online for non-urgent symptoms. For details visit [email or web address].   Also download the MyChart app! Go to the app store, search "MyChart", open the app, select [facility], and log in with your MyChart username and password.  Masks are optional in the cancer centers. If you would like for your care team to wear a mask while they are taking care of you, please let them know. You may have one support Sue Chase who is at least 66 years old accompany you for your appointments.

## 2022-01-26 NOTE — Patient Instructions (Signed)
Thank you for choosing [facility] to provide your care.   Should you have questions after your visit to the [facility]), please contact this office at [phone] between 8:30 AM and 4:30 PM.  Voice mails left after 4:00 PM may not be returned until the following business day.  Calls received after 4:30 PM will be answered by an off-site Nurse Triage Line.    Prescription Refills:  Please have your pharmacy contact us directly for most prescription requests.  Contact the office directly for refills of narcotics (pain medications). Allow 48-72 hours for refills.  Appointments: Please contact the [facility] scheduling department [phone] for questions regarding [facility] appointment scheduling.  Contact the schedulers with any scheduling changes so that your appointment can be rescheduled in a timely manner.   Central Scheduling for [facility] [phone] - Call to schedule PET scan, CT scan, MRI, and Ultrasound.  To afford each patient quality time with our providers, please arrive 30 minutes before your scheduled appointment time.  If you arrive late for your appointment, you may be asked to reschedule.  We strive to give you quality time with our providers, and arriving late affects you and other patients whose appointments are after yours. If you are a no show for multiple scheduled visits, you may be dismissed from the clinic at the providers discretion.     Resources: [place] [employer] [phone] for additional information on assistance programs or assistance connecting with community support programs   [place]  [phone]: Information regarding food stamps, Medicaid, and utility assistance [employer] [place] [employer]'s shared-ride transportation service for eligible riders who have a disability that prevents them from riding the fixed route bus.   [facility] 5041955518 Helps people with  Medicare understand their rights and benefits, navigate the Medicare system, and secure the quality healthcare they deserve [employer] [phone] Assists patients locate various types of support and financial assistance Cancer Care: 1-800-813-HOPE (726) 100-1240) Provides financial assistance, online support groups, medication/co-pay assistance.   Transportation Assistance for appointments at [facility] or provider support staff for referral to [facility]   Again, thank you for choosing [facility] for your care.               Steps to Quit Smoking Smoking tobacco is the leading cause of preventable death. It can affect almost every organ in the body. Smoking puts you and people around you at risk for many serious, long-lasting (chronic) diseases. Quitting smoking can be hard, but it is one of the best things that you can do for your health. It is never too late to quit. Do not give up if you cannot quit the first time. Some people need to try many times to quit. Do your best to stick to your quit plan, and talk with your doctor if you have any questions or concerns. How do I get ready to quit? Pick a date to quit. Set a date within the next 2 weeks to give you time to prepare. Write down the reasons why you are quitting. Keep this list in places where you will see it often. Tell your family, friends, and co-workers that you are quitting. Their support is important. Talk with your doctor about the choices that may help you quit. Find out if your health insurance will pay for these treatments. Know the people, places, things, and activities that make you want to smoke (  triggers). Avoid them. What first steps can I take to quit smoking? Throw away all cigarettes at home, at work, and in your car. Throw away the things that you use when you smoke, such as ashtrays and lighters. Clean your car. Empty the ashtray. Clean your home, including  curtains and carpets. What can I do to help me quit smoking? Talk with your doctor about taking medicines and seeing a counselor. You are more likely to succeed when you do both. If you are pregnant or breastfeeding: Talk with your doctor about counseling or other ways to quit smoking. Do not take medicine to help you quit smoking unless your doctor tells you to. Quit right away Quit smoking completely, instead of slowly cutting back on how much you smoke over a period of time. Stopping smoking right away may be more successful than slowly quitting. Go to counseling. In-person is best if this is an option. You are more likely to quit if you go to counseling sessions regularly. Take medicine You may take medicines to help you quit. Some medicines need a prescription, and some you can buy over-the-counter. Some medicines may contain a drug called nicotine to replace the nicotine in cigarettes. Medicines may: Help you stop having the desire to smoke (cravings). Help to stop the problems that come when you stop smoking (withdrawal symptoms). Your doctor may ask you to use: Nicotine patches, gum, or lozenges. Nicotine inhalers or sprays. Non-nicotine medicine that you take by mouth. Find resources Find resources and other ways to help you quit smoking and remain smoke-free after you quit. They include: Online chats with a Social worker. Phone quitlines. Printed Furniture conservator/restorer. Support groups or group counseling. Text messaging programs. Mobile phone apps. Use apps on your mobile phone or tablet that can help you stick to your quit plan. Examples of free services include Quit Guide from the CDC and smokefree.gov  What can I do to make it easier to quit?  Talk to your family and friends. Ask them to support and encourage you. Call a phone quitline, such as 1-800-QUIT-NOW, reach out to support groups, or work with a Social worker. Ask people who smoke to not smoke around you. Avoid places that  make you want to smoke, such as: Bars. Parties. Smoke-break areas at work. Spend time with people who do not smoke. Lower the stress in your life. Stress can make you want to smoke. Try these things to lower stress: Getting regular exercise. Doing deep-breathing exercises. Doing yoga. Meditating. What benefits will I see if I quit smoking? Over time, you may have: A better sense of smell and taste. Less coughing and sore throat. A slower heart rate. Lower blood pressure. Clearer skin. Better breathing. Fewer sick days. Summary Quitting smoking can be hard, but it is one of the best things that you can do for your health. Do not give up if you cannot quit the first time. Some people need to try many times to quit. When you decide to quit smoking, make a plan to help you succeed. Quit smoking right away, not slowly over a period of time. When you start quitting, get help and support to keep you smoke-free. This information is not intended to replace advice given to you by your health care provider. Make sure you discuss any questions you have with your health care provider. Document Revised: 01/29/2021 Document Reviewed: 01/29/2021 Elsevier Patient Education  [place].

## 2022-01-26 NOTE — Progress Notes (Signed)
[facility] [phone]) [phone]   [phone]) [phone]  OFFICE PROGRESS NOTE  Sue Peng, NP [place] [address]  DIAGNOSIS:  Stage IV (T2a, N0, M1a) non-small cell lung cancer, adenocarcinoma presented with multifocal bilateral pulmonary nodules involving the right upper lobe as well as the smaller bilateral groundglass opacities diagnosed in April 2023.  PD-L1 expression is 4%.  Molecular studies by [employer] 360 tissue test showed positive KRAS G12C mutation but the blood test failed secondary to insufficient circulating tumor DNA.  PRIOR THERAPY: None  CURRENT THERAPY: Systemic chemotherapy with carboplatin for AUC of 5, Alimta 500 Mg/M2 and Keytruda 200 Mg IV every 3 weeks.  First dose 08/10/2021.  Status post 7 cycles.  Starting from cycle #5 the patient is on maintenance treatment with Alimta and Keytruda every 3 weeks.  INTERVAL HISTORY: Sue Chase 66 y.o. female returns to the clinic today for follow-up visit.  The patient is feeling fine with no concerning complaints except for intermittent fatigue.  She has no recent chest pain, shortness of breath, cough or hemoptysis.  She has no nausea, vomiting, diarrhea or constipation.  She has no headache or visual changes.  She denied having any significant weight loss or night sweats.  She is here today for evaluation before starting cycle #8 of her treatment.  MEDICAL HISTORY: Past Medical History:  Diagnosis Date   Colon polyps    Complication of anesthesia    tolerated propofol but other meds made her emotional / cry/ difficulty breathing/ panic   Hyperlipidemia    Rheumatic fever    Seasonal allergies    UTI (urinary tract infection)     ALLERGIES:  is allergic to codeine.  MEDICATIONS:  Current Outpatient Medications  Medication Sig Dispense Refill   albuterol (VENTOLIN HFA) 108 (90 Base) MCG/ACT inhaler Inhale 2 puffs into the lungs every 6 (six) hours as needed  for wheezing or shortness of breath. (Patient not taking: Reported on 12/14/2021) 8 g 1   ELIQUIS 5 MG TABS tablet TAKE 1 TABLET BY MOUTH TWICE A DAY 60 tablet 1   Fluticasone-Umeclidin-Vilant (TRELEGY ELLIPTA) 100-62.5-25 MCG/ACT AEPB Inhale 1 puff into the lungs daily. (Patient not taking: Reported on 98/04/3823) 60 each 3   folic acid (FOLVITE) 1 MG tablet TAKE 1 TABLET BY MOUTH DAILY 30 tablet 4   lidocaine-prilocaine (EMLA) cream Apply to the Port-A-Cath site 30 minutes before chemotherapy (Patient taking differently: Apply 1 Application topically daily as needed. Apply to the Port-A-Cath site 30 minutes before chemotherapy) 30 g 0   magnesium oxide (MAG-OX) 400 (240 Mg) MG tablet Take 400 mg by mouth daily.     nicotine (NICODERM CQ - DOSED IN MG/24 HOURS) 14 mg/24hr patch 14 mg daily.     omeprazole (PRILOSEC) 20 MG capsule Take 1 capsule (20 mg total) by mouth daily. (Patient not taking: Reported on 12/14/2021) 30 capsule 3   prochlorperazine (COMPAZINE) 10 MG tablet Take 1 tablet (10 mg total) by mouth every 6 (six) hours as needed for nausea or vomiting. (Patient not taking: Reported on 12/14/2021) 30 tablet 0   No current facility-administered medications for this visit.    SURGICAL HISTORY:  Past Surgical History:  Procedure Laterality Date   BRONCHIAL BIOPSY  06/08/2021   Procedure: BRONCHIAL BIOPSIES;  Surgeon: Garner Nash, DO;  Location: [facility];  Service: Pulmonary;;   BRONCHIAL BRUSHINGS  06/08/2021   Procedure: BRONCHIAL BRUSHINGS;  Surgeon: Garner Nash, DO;  Location: [facility];  Service: Pulmonary;;   BRONCHIAL NEEDLE ASPIRATION BIOPSY  06/08/2021   Procedure: BRONCHIAL NEEDLE ASPIRATION BIOPSIES;  Surgeon: Garner Nash, DO;  Location: [facility];  Service: Pulmonary;;   COLONOSCOPY  2018   ERCP N/A 11/05/2021   Procedure: ENDOSCOPIC RETROGRADE CHOLANGIOPANCREATOGRAPHY (ERCP);  Surgeon: Irene Shipper, MD;  Location: [facility];  Service:  Gastroenterology;  Laterality: N/A;   IR IMAGING GUIDED PORT INSERTION  08/12/2021   REMOVAL OF STONES  11/05/2021   Procedure: REMOVAL OF STONES;  Surgeon: Irene Shipper, MD;  Location: [facility];  Service: Gastroenterology;;   SALIVARY GLAND SURGERY     LATE 80S   SPHINCTEROTOMY  11/05/2021   Procedure: SPHINCTEROTOMY;  Surgeon: Irene Shipper, MD;  Location: [facility];  Service: Gastroenterology;;   TUBAL LIGATION  1986   VIDEO BRONCHOSCOPY WITH RADIAL ENDOBRONCHIAL ULTRASOUND  06/08/2021   Procedure: VIDEO BRONCHOSCOPY WITH RADIAL ENDOBRONCHIAL ULTRASOUND;  Surgeon: Garner Nash, DO;  Location: [facility];  Service: Pulmonary;;    REVIEW OF SYSTEMS:  A comprehensive review of systems was negative except for: Constitutional: positive for fatigue   PHYSICAL EXAMINATION: General appearance: alert, cooperative, fatigued, and no distress Head: Normocephalic, without obvious abnormality, atraumatic Neck: no adenopathy, no JVD, supple, symmetrical, trachea midline, and thyroid not enlarged, symmetric, no tenderness/mass/nodules Lymph nodes: Cervical, supraclavicular, and axillary nodes normal. Resp: clear to auscultation bilaterally Back: symmetric, no curvature. ROM normal. No CVA tenderness. Cardio: regular rate and rhythm, S1, S2 normal, no murmur, click, rub or gallop GI: soft, non-tender; bowel sounds normal; no masses,  no organomegaly Extremities: extremities normal, atraumatic, no cyanosis or edema  ECOG PERFORMANCE STATUS: 1 - Symptomatic but completely ambulatory  Blood pressure (!) 143/82, pulse 80, temperature 97.8 F (36.6 C), temperature source Oral, resp. rate 18, weight 98 lb 12.8 oz (44.8 kg), SpO2 95 %.  LABORATORY DATA: Lab Results  Component Value Date   WBC 8.5 01/26/2022   HGB 10.1 (L) 01/26/2022   HCT 29.7 (L) 01/26/2022   MCV 96.7 01/26/2022   PLT 328 01/26/2022      Chemistry      Component Value Date/Time   NA 140 01/05/2022 0950   K 3.2 (L)  01/05/2022 0950   CL 104 01/05/2022 0950   CO2 28 01/05/2022 0950   BUN 9 01/05/2022 0950   CREATININE 0.93 01/05/2022 0950   CREATININE 0.72 12/14/2021 1059      Component Value Date/Time   CALCIUM 9.4 01/05/2022 0950   ALKPHOS 87 01/05/2022 0950   AST 17 01/05/2022 0950   AST 18 12/14/2021 1059   ALT 7 01/05/2022 0950   ALT 9 12/14/2021 1059   BILITOT 0.3 01/05/2022 0950   BILITOT 0.4 12/14/2021 1059       RADIOGRAPHIC STUDIES: CT Chest W Contrast  Result Date: 01/02/2022 CLINICAL DATA:  66 year old female with history of non-small cell lung cancer. Follow-up study. * Tracking Code: BO * EXAM: CT CHEST, ABDOMEN, AND PELVIS WITH CONTRAST TECHNIQUE: Multidetector CT imaging of the chest, abdomen and pelvis was performed following the standard protocol during bolus administration of intravenous contrast. RADIATION DOSE REDUCTION: This exam was performed according to the departmental dose-optimization program which includes automated exposure control, adjustment of the mA and/or kV according to patient size and/or use of iterative reconstruction technique. CONTRAST:  75m OMNIPAQUE IOHEXOL 300 MG/ML  SOLN COMPARISON:  CT of the chest, abdomen and pelvis 10/03/2021. FINDINGS: CT CHEST FINDINGS Cardiovascular: Heart size is normal. There is  no significant pericardial fluid, thickening or pericardial calcification. There is aortic atherosclerosis, as well as atherosclerosis of the great vessels of the mediastinum and the coronary arteries, including calcified atherosclerotic plaque in the left anterior descending coronary artery. Ectasia of ascending thoracic aorta (4 cm in diameter). Right internal jugular single-lumen Port-A-Cath with tip terminating in the right atrium. Mediastinum/Nodes: No pathologically enlarged mediastinal or hilar lymph nodes. Esophagus is unremarkable in appearance. No axillary lymphadenopathy. Lungs/Pleura: Continued enlargement of a mixed attenuation lesion in the  posterior aspect of the right upper lobe (axial image 49 of series 5), with the ground-glass attenuation component measuring 4.1 x 3.0 cm (previously 3.3 x 1.7 cm), with internal solid soft tissue attenuation components measuring up to 21 mm in greatest length (axial image 53 of series 5). This lesion has a very aggressive appearance, with continued traction of the adjacent right major fissure which is pulled toward the lesion, and radiating spiculations extending outward from the lesion. Innumerable smaller nodular areas of ground-glass attenuation are scattered throughout all aspects of the lungs bilaterally, likely reflecting widespread metastatic disease in the lungs. Some of these lesions have qualitatively changed compared to the prior study, best typified by a solid-appearing nodule in the anterior right lower lobe abutting the major fissure (axial image 116 of series 5) which currently measures 8 x 6 mm (mean diameter of 7 mm), which was previously more ill-defined and ground-glass attenuation in appearance on the prior examination. No confluent consolidative airspace disease. No pleural effusions. Mild diffuse bronchial wall thickening with mild centrilobular and paraseptal emphysema. Musculoskeletal: There are no aggressive appearing lytic or blastic lesions noted in the visualized portions of the skeleton. CT ABDOMEN PELVIS FINDINGS Hepatobiliary: No suspicious cystic or solid hepatic lesions. No intra or extrahepatic biliary ductal dilatation. Pneumobilia noted throughout the liver, indicative of prior sphincterotomy. Gallbladder is nearly completely decompressed, and contains a small amount of gas non dependently, but is otherwise unremarkable in appearance. Pancreas: No pancreatic mass. No pancreatic ductal dilatation. No pancreatic or peripancreatic fluid collections or inflammatory changes. Spleen: Unremarkable. Adrenals/Urinary Tract: Cortical thinning in the medial aspect of the upper pole of the  right kidney, presumably chronic postinfectious scarring or related to remote infarct. Left kidney and bilateral adrenal glands are otherwise normal in appearance. No hydroureteronephrosis. Urinary bladder is unremarkable in appearance. Stomach/Bowel: The appearance of the stomach is unremarkable. No pathologic dilatation of small bowel or colon. Numerous colonic diverticuli are noted, without surrounding inflammatory changes to suggest an acute diverticulitis at this time. The appendix is not confidently identified and may be surgically absent. Regardless, there are no inflammatory changes noted adjacent to the cecum to suggest the presence of an acute appendicitis at this time. Vascular/Lymphatic: Atherosclerosis in the abdominal and pelvic vasculature, without evidence of aneurysm or dissection. High-grade stenosis of the left common femoral artery (axial image 97 of series 3) secondary to eccentric noncalcified plaque. Moderate stenosis also noted of the right common femoral artery. No lymphadenopathy noted in the abdomen or pelvis. Reproductive: Uterus and ovaries are atrophic. Other: No significant volume of ascites.  No pneumoperitoneum. Musculoskeletal: There are no aggressive appearing lytic or blastic lesions noted in the visualized portions of the skeleton. IMPRESSION: 1. Interval increase in size and qualitative change in mixed ground-glass attenuation and solid mass in the posterior aspect of the right upper lobe highly concerning for aggressive neoplasm such as primary bronchogenic adenocarcinoma. Many of the smaller areas of nodularity in the lungs have also become more prominent when  compared to the prior study and likely reflect widespread aerogenous metastasis, as discussed above. 2. No definite signs of metastatic disease to the abdomen or pelvis. 3. Aortic atherosclerosis, in addition to left anterior descending coronary artery disease. Please note that although the presence of coronary artery  calcium documents the presence of coronary artery disease, the severity of this disease and any potential stenosis cannot be assessed on this non-gated CT examination. Assessment for potential risk factor modification, dietary therapy or pharmacologic therapy may be warranted, if clinically indicated. 4. Ectasia of ascending thoracic aorta (4.0 cm in diameter). Recommend annual imaging followup by CTA or MRA. This recommendation follows 2010 ACCF/AHA/AATS/ACR/ASA/SCA/SCAI/SIR/STS/SVM Guidelines for the Diagnosis and Management of Patients with Thoracic Aortic Disease. Circulation. 2010; 121: K932-I712. Aortic aneurysm NOS (ICD10-I71.9). 5. Severe stenosis of the left common femoral artery again noted. 6. Additional incidental findings, as above. Electronically Signed   By: Vinnie Langton M.D.   On: 01/02/2022 13:57   CT Abdomen Pelvis W Contrast  Result Date: 01/02/2022 CLINICAL DATA:  66 year old female with history of non-small cell lung cancer. Follow-up study. * Tracking Code: BO * EXAM: CT CHEST, ABDOMEN, AND PELVIS WITH CONTRAST TECHNIQUE: Multidetector CT imaging of the chest, abdomen and pelvis was performed following the standard protocol during bolus administration of intravenous contrast. RADIATION DOSE REDUCTION: This exam was performed according to the departmental dose-optimization program which includes automated exposure control, adjustment of the mA and/or kV according to patient size and/or use of iterative reconstruction technique. CONTRAST:  68m OMNIPAQUE IOHEXOL 300 MG/ML  SOLN COMPARISON:  CT of the chest, abdomen and pelvis 10/03/2021. FINDINGS: CT CHEST FINDINGS Cardiovascular: Heart size is normal. There is no significant pericardial fluid, thickening or pericardial calcification. There is aortic atherosclerosis, as well as atherosclerosis of the great vessels of the mediastinum and the coronary arteries, including calcified atherosclerotic plaque in the left anterior descending  coronary artery. Ectasia of ascending thoracic aorta (4 cm in diameter). Right internal jugular single-lumen Port-A-Cath with tip terminating in the right atrium. Mediastinum/Nodes: No pathologically enlarged mediastinal or hilar lymph nodes. Esophagus is unremarkable in appearance. No axillary lymphadenopathy. Lungs/Pleura: Continued enlargement of a mixed attenuation lesion in the posterior aspect of the right upper lobe (axial image 49 of series 5), with the ground-glass attenuation component measuring 4.1 x 3.0 cm (previously 3.3 x 1.7 cm), with internal solid soft tissue attenuation components measuring up to 21 mm in greatest length (axial image 53 of series 5). This lesion has a very aggressive appearance, with continued traction of the adjacent right major fissure which is pulled toward the lesion, and radiating spiculations extending outward from the lesion. Innumerable smaller nodular areas of ground-glass attenuation are scattered throughout all aspects of the lungs bilaterally, likely reflecting widespread metastatic disease in the lungs. Some of these lesions have qualitatively changed compared to the prior study, best typified by a solid-appearing nodule in the anterior right lower lobe abutting the major fissure (axial image 116 of series 5) which currently measures 8 x 6 mm (mean diameter of 7 mm), which was previously more ill-defined and ground-glass attenuation in appearance on the prior examination. No confluent consolidative airspace disease. No pleural effusions. Mild diffuse bronchial wall thickening with mild centrilobular and paraseptal emphysema. Musculoskeletal: There are no aggressive appearing lytic or blastic lesions noted in the visualized portions of the skeleton. CT ABDOMEN PELVIS FINDINGS Hepatobiliary: No suspicious cystic or solid hepatic lesions. No intra or extrahepatic biliary ductal dilatation. Pneumobilia noted throughout the liver, indicative  of prior sphincterotomy.  Gallbladder is nearly completely decompressed, and contains a small amount of gas non dependently, but is otherwise unremarkable in appearance. Pancreas: No pancreatic mass. No pancreatic ductal dilatation. No pancreatic or peripancreatic fluid collections or inflammatory changes. Spleen: Unremarkable. Adrenals/Urinary Tract: Cortical thinning in the medial aspect of the upper pole of the right kidney, presumably chronic postinfectious scarring or related to remote infarct. Left kidney and bilateral adrenal glands are otherwise normal in appearance. No hydroureteronephrosis. Urinary bladder is unremarkable in appearance. Stomach/Bowel: The appearance of the stomach is unremarkable. No pathologic dilatation of small bowel or colon. Numerous colonic diverticuli are noted, without surrounding inflammatory changes to suggest an acute diverticulitis at this time. The appendix is not confidently identified and may be surgically absent. Regardless, there are no inflammatory changes noted adjacent to the cecum to suggest the presence of an acute appendicitis at this time. Vascular/Lymphatic: Atherosclerosis in the abdominal and pelvic vasculature, without evidence of aneurysm or dissection. High-grade stenosis of the left common femoral artery (axial image 97 of series 3) secondary to eccentric noncalcified plaque. Moderate stenosis also noted of the right common femoral artery. No lymphadenopathy noted in the abdomen or pelvis. Reproductive: Uterus and ovaries are atrophic. Other: No significant volume of ascites.  No pneumoperitoneum. Musculoskeletal: There are no aggressive appearing lytic or blastic lesions noted in the visualized portions of the skeleton. IMPRESSION: 1. Interval increase in size and qualitative change in mixed ground-glass attenuation and solid mass in the posterior aspect of the right upper lobe highly concerning for aggressive neoplasm such as primary bronchogenic adenocarcinoma. Many of the smaller  areas of nodularity in the lungs have also become more prominent when compared to the prior study and likely reflect widespread aerogenous metastasis, as discussed above. 2. No definite signs of metastatic disease to the abdomen or pelvis. 3. Aortic atherosclerosis, in addition to left anterior descending coronary artery disease. Please note that although the presence of coronary artery calcium documents the presence of coronary artery disease, the severity of this disease and any potential stenosis cannot be assessed on this non-gated CT examination. Assessment for potential risk factor modification, dietary therapy or pharmacologic therapy may be warranted, if clinically indicated. 4. Ectasia of ascending thoracic aorta (4.0 cm in diameter). Recommend annual imaging followup by CTA or MRA. This recommendation follows 2010 ACCF/AHA/AATS/ACR/ASA/SCA/SCAI/SIR/STS/SVM Guidelines for the Diagnosis and Management of Patients with Thoracic Aortic Disease. Circulation. 2010; 121: X937-J696. Aortic aneurysm NOS (ICD10-I71.9). 5. Severe stenosis of the left common femoral artery again noted. 6. Additional incidental findings, as above. Electronically Signed   By: Vinnie Langton M.D.   On: 01/02/2022 13:57    ASSESSMENT AND PLAN: This is a very pleasant 66 years old white female diagnosed with stage IV (T2 a, N0, M1 a) non-small cell lung cancer, adenocarcinoma presented with multifocal bilateral pulmonary nodules involving the right upper lobe as well as the smaller bilateral groundglass opacities diagnosed in April 2023 with positive KRAS G12C mutation and PD-L1 expression of 4%. She is currently undergoing systemic chemotherapy with carboplatin for AUC of 5, pemetrexed 500 Mg/M2 and Keytruda 200 Mg IV every 3 weeks status post 7 cycles.  Starting from cycle #5 the patient is on maintenance treatment with Alimta and Keytruda every 3 weeks. The patient has been tolerating this treatment well with no concerning adverse  effects. I recommended for her to proceed with cycle #8 today as planned. I will see her back for follow-up visit in 3 weeks for evaluation before  the next cycle of her treatment. She was advised to call immediately if she has any other concerning symptoms in the interval.  The patient voices understanding of current disease status and treatment options and is in agreement with the current care plan. All questions were answered. The patient knows to call the clinic with any problems, questions or concerns. We can certainly see the patient much sooner if necessary.  The total time spent in the appointment was 20 minutes.  Disclaimer: This note was dictated with voice recognition software. Similar sounding words can inadvertently be transcribed and may not be corrected upon review.

## 2022-01-27 ENCOUNTER — Telehealth: Payer: Self-pay | Admitting: Adult Health

## 2022-01-27 ENCOUNTER — Other Ambulatory Visit (HOSPITAL_COMMUNITY): Payer: Self-pay

## 2022-01-27 ENCOUNTER — Encounter: Payer: Self-pay | Admitting: Internal Medicine

## 2022-01-27 DIAGNOSIS — R0981 Nasal congestion: Secondary | ICD-10-CM

## 2022-01-27 LAB — T4: T4, Total: 10.9 ug/dL (ref 4.5–12.0)

## 2022-01-27 NOTE — Telephone Encounter (Signed)
Ok to fill. This has been filled by other providers. Please advise

## 2022-01-27 NOTE — Telephone Encounter (Signed)
Alternative triple therapy of Sue Chase is also showing as covered under patients insurance.

## 2022-01-27 NOTE — Telephone Encounter (Signed)
Pt has lung cancer and says the weekend is coming up and she need does not want to be without her  albuterol (VENTOLIN HFA) 108 (90 Base) MCG/ACT inhaler   Pt states she is not taking the Trelegy that Dr. Valeta Harms prescribed because she cannot afford it.   Please advise.  [facility] [id number] - [place] [address], [place] Phone: [phone]  Fax: [phone]   LOV:  11/11/21

## 2022-01-27 NOTE — Telephone Encounter (Signed)
Called and spoke with pt who states the Trelegy is going to cost her $75 a month which she said she could not afford to have to pay each month. Due to this, she is requesting to have something else called in for her that would be more affordable.  Routing this to both Dr. Valeta Harms and prior auth team for them to review. Please advise.

## 2022-01-28 MED ORDER — ALBUTEROL SULFATE HFA 108 (90 BASE) MCG/ACT IN AERS: 2.0000 | INHALATION_SPRAY | Freq: Four times a day (QID) | RESPIRATORY_TRACT | 5 refills | Status: DC | PRN

## 2022-01-28 MED ORDER — BREZTRI AEROSPHERE 160-9-4.8 MCG/ACT IN AERO: 2.0000 | INHALATION_SPRAY | Freq: Two times a day (BID) | RESPIRATORY_TRACT | 5 refills | Status: DC

## 2022-01-28 NOTE — Telephone Encounter (Signed)
Called and spoke with pt letting her know the info from prior auth team about her inhalers and she verbalized understanding. Rx for [facility] sent to preferred pharmacy. Pt also stated she was needing to have her albuterol inhaler refilled so Rx has also been sent for that. Nothing further needed.

## 2022-01-28 NOTE — Telephone Encounter (Signed)
Attempted to call pt but unable to reach. Unable to leave VM as no mailbox has been set up. Will try to call back later.

## 2022-01-30 ENCOUNTER — Other Ambulatory Visit: Payer: Self-pay

## 2022-01-31 ENCOUNTER — Other Ambulatory Visit: Payer: Self-pay

## 2022-02-02 ENCOUNTER — Other Ambulatory Visit: Payer: Self-pay | Admitting: Medical Oncology

## 2022-02-02 ENCOUNTER — Telehealth: Payer: Self-pay | Admitting: Medical Oncology

## 2022-02-02 DIAGNOSIS — C3491 Malignant neoplasm of unspecified part of right bronchus or lung: Secondary | ICD-10-CM

## 2022-02-02 NOTE — Telephone Encounter (Signed)
Chipped front tooth and it has filling in it. She does not have a dentist. Pt referred to dental medicine.

## 2022-02-03 ENCOUNTER — Telehealth: Payer: Self-pay | Admitting: Medical Oncology

## 2022-02-03 NOTE — Telephone Encounter (Signed)
Pt notified that dental medicine cannot see her as the issue is better handled by a general dentist.

## 2022-02-03 NOTE — Telephone Encounter (Signed)
I told Sue Chase that I left a VM at Dental medicine dept today and sent referral via [facility] yesterday.

## 2022-02-11 NOTE — Progress Notes (Unsigned)
[facility] OFFICE PROGRESS NOTE  Sue Peng, NP [place] [address]  DIAGNOSIS: Stage IV (T2a, N0, M1a) non-small cell lung cancer, adenocarcinoma presented with multifocal bilateral pulmonary nodules involving the right upper lobe as well as the smaller bilateral groundglass opacities diagnosed in April 2023.  PD-L1 expression is 4%.   Molecular studies by [employer] 360 tissue test showed positive KRAS G12C mutation but the blood test failed secondary to insufficient circulating tumor DNA.  PRIOR THERAPY: None  CURRENT THERAPY:  Systemic chemotherapy with carboplatin for AUC of 5, Alimta 500 Mg/M2 and Keytruda 200 Mg IV every 3 weeks.  First dose 08/10/2021. Starting from cycle #5, the patient started maintenance Alimta and Keytruda.  Status post 8 cycles.     INTERVAL HISTORY: Sue Chase 66 y.o. female returns to the clinic today for a follow-up visit. The patient is currently undergoing chemotherapy and immunotherapy.  She tolerates this fairly well.  She reports stable fatigue although her Hbg has dropped to 8.2.  She denies any abnormal bleeding or bruising.  She is on a blood thinner.  She is compliant with her folic acid.  She is not on an iron supplement.  She tends to be sensitive to medications that can cause constipation but she has not taken an iron supplement recently.  She is required blood transfusions in the past secondary to chemotherapy-induced anemia.  Today she denies any fever, chills, or night sweats. Her weight is stable.  She generally does not have a great appetite in general. Denies any nausea, vomiting, diarrhea, or constipation. She saw pulmonary medicine recently.  She reports her shortness of breath has improved greatly since she started using Symbicort.  Denies any significant cough.  Denies any hemoptysis or chest pain.  Denies any headaches. Denies any rashes or skin changes.  She is here for evaluation and repeat  blood work before starting cycle #9.   MEDICAL HISTORY: Past Medical History:  Diagnosis Date   Colon polyps    Complication of anesthesia    tolerated propofol but other meds made her emotional / cry/ difficulty breathing/ panic   Hyperlipidemia    Rheumatic fever    Seasonal allergies    UTI (urinary tract infection)     ALLERGIES:  is allergic to codeine.  MEDICATIONS:  Current Outpatient Medications  Medication Sig Dispense Refill   albuterol (VENTOLIN HFA) 108 (90 Base) MCG/ACT inhaler Inhale 2 puffs into the lungs every 6 (six) hours as needed for wheezing or shortness of breath. 8 g 5   Budeson-Glycopyrrol-Formoterol (BREZTRI AEROSPHERE) 160-9-4.8 MCG/ACT AERO Inhale 2 puffs into the lungs in the morning and at bedtime. 10.7 g 5   ELIQUIS 5 MG TABS tablet TAKE 1 TABLET BY MOUTH TWICE A DAY 60 tablet 1   FeFum-FePoly-FA-B Cmp-C-Biot (INTEGRA PLUS) CAPS Take 1 capsule by mouth every morning. 30 capsule 2   folic acid (FOLVITE) 1 MG tablet TAKE 1 TABLET BY MOUTH DAILY 30 tablet 4   lidocaine-prilocaine (EMLA) cream Apply to the Port-A-Cath site 30 minutes before chemotherapy (Patient taking differently: Apply 1 Application topically daily as needed. Apply to the Port-A-Cath site 30 minutes before chemotherapy) 30 g 0   magnesium oxide (MAG-OX) 400 (240 Mg) MG tablet Take 400 mg by mouth daily.     nicotine (NICODERM CQ - DOSED IN MG/24 HOURS) 14 mg/24hr patch 14 mg daily.     omeprazole (PRILOSEC) 20 MG capsule Take 1 capsule (20 mg total) by  mouth daily. 30 capsule 3   potassium chloride SA (KLOR-CON M) 20 MEQ tablet Take 1 tablet (20 mEq total) by mouth 2 (two) times daily. 14 tablet 0   prochlorperazine (COMPAZINE) 10 MG tablet Take 1 tablet (10 mg total) by mouth every 6 (six) hours as needed for nausea or vomiting. 30 tablet 0   SYMBICORT 160-4.5 MCG/ACT inhaler Inhale into the lungs.     No current facility-administered medications for this visit.    SURGICAL HISTORY:   Past Surgical History:  Procedure Laterality Date   BRONCHIAL BIOPSY  06/08/2021   Procedure: BRONCHIAL BIOPSIES;  Surgeon: Garner Nash, DO;  Location: [facility];  Service: Pulmonary;;   BRONCHIAL BRUSHINGS  06/08/2021   Procedure: BRONCHIAL BRUSHINGS;  Surgeon: Garner Nash, DO;  Location: [facility];  Service: Pulmonary;;   BRONCHIAL NEEDLE ASPIRATION BIOPSY  06/08/2021   Procedure: BRONCHIAL NEEDLE ASPIRATION BIOPSIES;  Surgeon: Garner Nash, DO;  Location: [facility];  Service: Pulmonary;;   COLONOSCOPY  2018   ERCP N/A 11/05/2021   Procedure: ENDOSCOPIC RETROGRADE CHOLANGIOPANCREATOGRAPHY (ERCP);  Surgeon: Irene Shipper, MD;  Location: [facility];  Service: Gastroenterology;  Laterality: N/A;   IR IMAGING GUIDED PORT INSERTION  08/12/2021   REMOVAL OF STONES  11/05/2021   Procedure: REMOVAL OF STONES;  Surgeon: Irene Shipper, MD;  Location: [facility];  Service: Gastroenterology;;   SALIVARY GLAND SURGERY     LATE 80S   SPHINCTEROTOMY  11/05/2021   Procedure: SPHINCTEROTOMY;  Surgeon: Irene Shipper, MD;  Location: [facility];  Service: Gastroenterology;;   TUBAL LIGATION  1986   VIDEO BRONCHOSCOPY WITH RADIAL ENDOBRONCHIAL ULTRASOUND  06/08/2021   Procedure: VIDEO BRONCHOSCOPY WITH RADIAL ENDOBRONCHIAL ULTRASOUND;  Surgeon: Garner Nash, DO;  Location: [facility];  Service: Pulmonary;;    REVIEW OF SYSTEMS:   Constitutional: Positive for fatigue and diminished appetite.. Negative for chills or fever. HENT: Negative for mouth sores, nosebleeds, sore throat and trouble swallowing.   Eyes: Negative for eye problems and icterus.  Respiratory: Positive for improving dyspnea on exertion. Negative for cough, hemoptysis, and wheezing.   Cardiovascular: Negative for chest pain and leg swelling.  Gastrointestinal: Negative for abdominal pain, constipation, diarrhea, nausea and vomiting.  Genitourinary: Negative for bladder incontinence, difficulty urinating,  dysuria, frequency and hematuria.   Musculoskeletal: Negative for back pain, gait problem, neck pain and neck stiffness.  Skin: Negative for itching and rash.  Neurological: Negative for dizziness, extremity weakness, gait problem, headaches, light-headedness and seizures.  Hematological: Negative for adenopathy. Does not bruise/bleed easily.  Psychiatric/Behavioral: Negative for confusion, depression and sleep disturbance. The patient is not nervous/anxious.    PHYSICAL EXAMINATION:  Blood pressure 125/75, pulse 80, temperature 98 F (36.7 C), temperature source Oral, resp. rate 18, height _0  (1.6 m), weight 98 lb 8 oz (44.7 kg), SpO2 99 %.  ECOG PERFORMANCE STATUS: 1  Physical Exam  Constitutional: Oriented to person, place, and time and thin appearing female, and in no distress.  HENT:  Head: Normocephalic and atraumatic.  Mouth/Throat: Oropharynx is clear and moist. No oropharyngeal exudate.  Eyes: Conjunctivae are normal. Right eye exhibits no discharge. Left eye exhibits no discharge. No scleral icterus.  Neck: Normal range of motion. Neck supple.  Cardiovascular: Normal rate, regular rhythm, normal heart sounds and intact distal pulses.   Pulmonary/Chest: Effort normal.  Right breath sounds in all lung fields.  No respiratory distress. No wheezes. No rales.  Abdominal: Soft. Bowel sounds are normal. Exhibits no  distension and no mass. There is no tenderness.  Musculoskeletal: Normal range of motion. Exhibits no edema.  Lymphadenopathy:    No cervical adenopathy.  Neurological: Alert and oriented to person, place, and time. Exhibits normal muscle tone. Gait normal. Coordination normal.  Skin: Skin is warm and dry. No rash noted. Not diaphoretic. No erythema. No pallor.  Psychiatric: Mood, memory and judgment normal.  Vitals reviewed.  LABORATORY DATA: Lab Results  Component Value Date   WBC 8.3 02/15/2022   HGB 8.2 (L) 02/15/2022   HCT 23.9 (L) 02/15/2022   MCV 95.6  02/15/2022   PLT 314 02/15/2022      Chemistry      Component Value Date/Time   NA 139 02/15/2022 1115   K 2.9 (L) 02/15/2022 1115   CL 103 02/15/2022 1115   CO2 28 02/15/2022 1115   BUN 11 02/15/2022 1115   CREATININE 1.05 (H) 02/15/2022 1115      Component Value Date/Time   CALCIUM 9.0 02/15/2022 1115   ALKPHOS 77 02/15/2022 1115   AST 18 02/15/2022 1115   ALT 7 02/15/2022 1115   BILITOT 0.3 02/15/2022 1115       RADIOGRAPHIC STUDIES:  No results found.   ASSESSMENT/PLAN:  This very pleasant 66 year old Caucasian female diagnosed with stage IV (T2 a, N0, M1 a) non-small cell lung cancer, adenocarcinoma.  The patient presented multifocal bilateral pulmonary nodules involving the right upper lobe as well as small bilateral groundglass opacities.  She was diagnosed in April 2023.  She is positive for a K-ras G12 C mutation and her PD-L1 expression of 4%.  Her K-ras G12 C mutation can be targeted in the second line setting in the future.    The patient is currently undergoing systemic chemotherapy with carboplatin for an AUC of 5, Alimta 500 mg per metered square, Keytruda 200 mg IV every 3 weeks.  She is status post 8 cycles.  She tolerates this fairly well. Starting from cycle #5, she started maintenance alimta and [place].   Labs were reviewed.  Her creatinine is 1.05 and her GFR is 59.  Her creatinine clearance was calculated by pharmacy to be around 40.  Given her worsening anemia and creatinine clearance will reduce her Alimta to 400 mg/m dose for the cycle of treatment and will reassess at her next appointment.  Patient was instructed to hydrate more.  The patient is having worsening anemia.  She is compliant with her folic acid.  She denies any obvious bleeding or bruising.  She receives B12 injections every 9 weeks.  The patient has required blood transfusions in the past.  She is just over the threshold.  We will arrange for weekly lab appointments and sample blood  bank's to be drawn as I think it is likely she will need a blood transfusion in the interval.  We may also tentatively schedule the patient for 2 units of blood next week which we could cancel if she does not need the transfusion.  Would consider blood transfusion if her hemoglobin drops below 8.  We will arrange for her to have stool cards performed to ensure no GI blood loss.  I have also sent her prescription for Integra plus as the patient reports she is sensitive to medications that can cause constipation.  Integra plus may be more tolerable than over-the-counter iron supplements.  I will arrange for a restaging CT scan of the CAP prior to her next cycle of treatment.   We will see her back  for a follow up visit and repeat blood work before starting cycle #10.   Her potassium is slightly low today.  I sent a prescription for potassium [facility] to her pharmacy.  I also gave the patient handout of potassium rich food and strongly encouraged her to increase her dietary intake of potassium rich food.  Since her potassium is frequently low on labs, I will arrange for magnesium to be drawn at her next lab visit to ensure that this is not contributing to her frequent hypokalemia.   The patient was advised to call immediately if she has any concerning symptoms in the interval. The patient voices understanding of current disease status and treatment options and is in agreement with the current care plan. All questions were answered. The patient knows to call the clinic with any problems, questions or concerns. We can certainly see the patient much sooner if necessary  Orders Placed This Encounter  Procedures   CT Chest W Contrast    Standing Status:   Future    Standing Expiration Date:   02/15/2023    Order Specific Question:   If indicated for the ordered procedure, I authorize the administration of contrast media per Radiology protocol    Answer:   Yes    Order Specific Question:   Does the patient  have a contrast media/X-ray dye allergy?    Answer:   No    Order Specific Question:   Preferred imaging location?    Answer:   [facility]   CT Abdomen Pelvis W Contrast    Standing Status:   Future    Standing Expiration Date:   02/15/2023    Order Specific Question:   If indicated for the ordered procedure, I authorize the administration of contrast media per Radiology protocol    Answer:   Yes    Order Specific Question:   Does the patient have a contrast media/X-ray dye allergy?    Answer:   No    Order Specific Question:   Preferred imaging location?    Answer:   [facility]    Order Specific Question:   Is Oral Contrast requested for this exam?    Answer:   Yes, Per Radiology protocol   Occult blood card to lab, stool    Standing Status:   Future    Standing Expiration Date:   02/16/2023   Occult blood card to lab, stool    Standing Status:   Future    Standing Expiration Date:   02/16/2023   Occult blood card to lab, stool    Standing Status:   Future    Standing Expiration Date:   02/16/2023   Iron and Iron Binding Capacity ([facility] only)    Standing Status:   Future    Standing Expiration Date:   02/16/2023   Ferritin    Standing Status:   Future    Standing Expiration Date:   02/16/2023   Sample to Blood Bank    Standing Status:   Standing    Number of Occurrences:   10    Standing Expiration Date:   02/16/2023      The total time spent in the appointment was 30-39 minutes  Jesiel Garate L Rutherford Alarie, PA-C 02/15/22

## 2022-02-15 ENCOUNTER — Inpatient Hospital Stay (HOSPITAL_BASED_OUTPATIENT_CLINIC_OR_DEPARTMENT_OTHER): Payer: Medicare Other | Admitting: Physician Assistant

## 2022-02-15 ENCOUNTER — Inpatient Hospital Stay: Payer: Medicare Other

## 2022-02-15 VITALS — BP 125/75 | HR 80 | Temp 98.0°F | Resp 18 | Ht 63.0 in | Wt 98.5 lb

## 2022-02-15 DIAGNOSIS — J9601 Acute respiratory failure with hypoxia: Secondary | ICD-10-CM | POA: Diagnosis not present

## 2022-02-15 DIAGNOSIS — Z95828 Presence of other vascular implants and grafts: Secondary | ICD-10-CM | POA: Diagnosis not present

## 2022-02-15 DIAGNOSIS — D649 Anemia, unspecified: Secondary | ICD-10-CM

## 2022-02-15 DIAGNOSIS — E876 Hypokalemia: Secondary | ICD-10-CM | POA: Diagnosis not present

## 2022-02-15 DIAGNOSIS — C3491 Malignant neoplasm of unspecified part of right bronchus or lung: Secondary | ICD-10-CM

## 2022-02-15 DIAGNOSIS — Z5111 Encounter for antineoplastic chemotherapy: Secondary | ICD-10-CM

## 2022-02-15 DIAGNOSIS — Z5112 Encounter for antineoplastic immunotherapy: Secondary | ICD-10-CM

## 2022-02-15 LAB — CMP (CANCER CENTER ONLY)
ALT: 7 U/L (ref 0–44)
AST: 18 U/L (ref 15–41)
Albumin: 3.7 g/dL (ref 3.5–5.0)
Alkaline Phosphatase: 77 U/L (ref 38–126)
Anion gap: 8 (ref 5–15)
BUN: 11 mg/dL (ref 8–23)
CO2: 28 mmol/L (ref 22–32)
Calcium: 9 mg/dL (ref 8.9–10.3)
Chloride: 103 mmol/L (ref 98–111)
Creatinine: 1.05 mg/dL — ABNORMAL HIGH (ref 0.44–1.00)
GFR, Estimated: 59 mL/min — ABNORMAL LOW (ref >60)
Glucose, Bld: 103 mg/dL — ABNORMAL HIGH (ref 70–99)
Potassium: 2.9 mmol/L — ABNORMAL LOW (ref 3.5–5.1)
Sodium: 139 mmol/L (ref 135–145)
Total Bilirubin: 0.3 mg/dL (ref 0.3–1.2)
Total Protein: 7.2 g/dL (ref 6.5–8.1)

## 2022-02-15 LAB — CBC WITH DIFFERENTIAL (CANCER CENTER ONLY)
Abs Immature Granulocytes: 0.05 10*3/uL (ref 0.00–0.07)
Basophils Absolute: 0 10*3/uL (ref 0.0–0.1)
Basophils Relative: 0 %
Eosinophils Absolute: 1.5 10*3/uL — ABNORMAL HIGH (ref 0.0–0.5)
Eosinophils Relative: 18 %
HCT: 23.9 % — ABNORMAL LOW (ref 36.0–46.0)
Hemoglobin: 8.2 g/dL — ABNORMAL LOW (ref 12.0–15.0)
Immature Granulocytes: 1 %
Lymphocytes Relative: 22 %
Lymphs Abs: 1.9 10*3/uL (ref 0.7–4.0)
MCH: 32.8 pg (ref 26.0–34.0)
MCHC: 34.3 g/dL (ref 30.0–36.0)
MCV: 95.6 fL (ref 80.0–100.0)
Monocytes Absolute: 1.3 10*3/uL — ABNORMAL HIGH (ref 0.1–1.0)
Monocytes Relative: 15 %
Neutro Abs: 3.6 10*3/uL (ref 1.7–7.7)
Neutrophils Relative %: 44 %
Platelet Count: 314 10*3/uL (ref 150–400)
RBC: 2.5 MIL/uL — ABNORMAL LOW (ref 3.87–5.11)
RDW: 15.9 % — ABNORMAL HIGH (ref 11.5–15.5)
WBC Count: 8.3 10*3/uL (ref 4.0–10.5)
nRBC: 0 % (ref 0.0–0.2)

## 2022-02-15 LAB — TSH: TSH: 2.243 u[IU]/mL (ref 0.350–4.500)

## 2022-02-15 MED ORDER — SODIUM CHLORIDE 0.9 % IV SOLN: Freq: Once | INTRAVENOUS | Status: AC

## 2022-02-15 MED ORDER — CYANOCOBALAMIN 1000 MCG/ML IJ SOLN
1000.0000 ug | Freq: Once | INTRAMUSCULAR | Status: AC
  Administered 2022-02-15: 1000 ug via INTRAMUSCULAR
  Filled 2022-02-15: qty 1

## 2022-02-15 MED ORDER — SODIUM CHLORIDE 0.9 % IV SOLN
200.0000 mg | Freq: Once | INTRAVENOUS | Status: AC
  Administered 2022-02-15: 200 mg via INTRAVENOUS
  Filled 2022-02-15: qty 8

## 2022-02-15 MED ORDER — SODIUM CHLORIDE 0.9% FLUSH
10.0000 mL | INTRAVENOUS | Status: DC | PRN
  Administered 2022-02-15: 10 mL

## 2022-02-15 MED ORDER — SODIUM CHLORIDE 0.9% FLUSH
10.0000 mL | Freq: Once | INTRAVENOUS | Status: AC
  Administered 2022-02-15: 10 mL

## 2022-02-15 MED ORDER — INTEGRA PLUS PO CAPS: 1.0000 | ORAL_CAPSULE | Freq: Every morning | ORAL | 2 refills | Status: DC

## 2022-02-15 MED ORDER — PROCHLORPERAZINE MALEATE 10 MG PO TABS
10.0000 mg | ORAL_TABLET | Freq: Once | ORAL | Status: AC
  Administered 2022-02-15: 10 mg via ORAL
  Filled 2022-02-15: qty 1

## 2022-02-15 MED ORDER — HEPARIN SOD (PORK) LOCK FLUSH 100 UNIT/ML IV SOLN
500.0000 [IU] | Freq: Once | INTRAVENOUS | Status: AC | PRN
  Administered 2022-02-15: 500 [IU]

## 2022-02-15 MED ORDER — SODIUM CHLORIDE 0.9 % IV SOLN
400.0000 mg/m2 | Freq: Once | INTRAVENOUS | Status: AC
  Administered 2022-02-15: 600 mg via INTRAVENOUS
  Filled 2022-02-15: qty 20

## 2022-02-15 MED ORDER — POTASSIUM CHLORIDE CRYS ER 20 MEQ PO TBCR: 20.0000 meq | EXTENDED_RELEASE_TABLET | Freq: Two times a day (BID) | ORAL | 0 refills | Status: DC

## 2022-02-15 NOTE — Patient Instructions (Signed)
[facility]  Discharge Instructions: Thank you for choosing [facility] to provide your oncology and hematology care.   If you have a lab appointment with the [facility], please go directly to the [facility] and check in at the registration area.   Wear comfortable clothing and clothing appropriate for easy access to any Portacath or PICC line.   We strive to give you quality time with your provider. You may need to reschedule your appointment if you arrive late (15 or more minutes).  Arriving late affects you and other patients whose appointments are after yours.  Also, if you miss three or more appointments without notifying the office, you may be dismissed from the clinic at the provider's discretion.      For prescription refill requests, have your pharmacy contact our office and allow 72 hours for refills to be completed.    Today you received the following chemotherapy and/or immunotherapy agents: Keytruda and Alimta      To help prevent nausea and vomiting after your treatment, we encourage you to take your nausea medication as directed.  BELOW ARE SYMPTOMS THAT SHOULD BE REPORTED IMMEDIATELY: *FEVER GREATER THAN 100.4 F (38 C) OR HIGHER *CHILLS OR SWEATING *NAUSEA AND VOMITING THAT IS NOT CONTROLLED WITH YOUR NAUSEA MEDICATION *UNUSUAL SHORTNESS OF BREATH *UNUSUAL BRUISING OR BLEEDING *URINARY PROBLEMS (pain or burning when urinating, or frequent urination) *BOWEL PROBLEMS (unusual diarrhea, constipation, pain near the anus) TENDERNESS IN MOUTH AND THROAT WITH OR WITHOUT PRESENCE OF ULCERS (sore throat, sores in mouth, or a toothache) UNUSUAL RASH, SWELLING OR PAIN  UNUSUAL VAGINAL DISCHARGE OR ITCHING   Items with * indicate a potential emergency and should be followed up as soon as possible or go to the Emergency Department if any problems should occur.  Please show the CHEMOTHERAPY ALERT CARD or IMMUNOTHERAPY ALERT CARD at  check-in to the Emergency Department and triage nurse.  Should you have questions after your visit or need to cancel or reschedule your appointment, please contact [facility]  Dept: [phone]  and follow the prompts.  Office hours are 8:00 a.m. to 4:30 p.m. Monday - Friday. Please note that voicemails left after 4:00 p.m. may not be returned until the following business day.  We are closed weekends and major holidays. You have access to a nurse at all times for urgent questions. Please call the main number to the clinic Dept: [phone] and follow the prompts.   For any non-urgent questions, you may also contact your provider using MyChart. We now offer e-Visits for anyone 17 and older to request care online for non-urgent symptoms. For details visit [email or web address].   Also download the MyChart app! Go to the app store, search "MyChart", open the app, select [facility], and log in with your MyChart username and password.  Masks are optional in the cancer centers. If you would like for your care team to wear a mask while they are taking care of you, please let them know. You may have one support person who is at least 66 years old accompany you for your appointments.

## 2022-02-15 NOTE — Progress Notes (Signed)
CrCl ~ 49mL/min today; per Cassie, PA, will decrease today's pemetrexed dose to 400mg /m2.  Laray Anger, PharmD PGY-2 Pharmacy Resident Hematology/Oncology [phone]  02/15/2022 1:01 PM

## 2022-02-16 ENCOUNTER — Other Ambulatory Visit: Payer: Medicare Other

## 2022-02-16 ENCOUNTER — Ambulatory Visit: Payer: Medicare Other | Admitting: Internal Medicine

## 2022-02-16 ENCOUNTER — Ambulatory Visit: Payer: Medicare Other

## 2022-02-17 ENCOUNTER — Telehealth: Payer: Self-pay | Admitting: Internal Medicine

## 2022-02-17 ENCOUNTER — Encounter (HOSPITAL_COMMUNITY): Payer: Self-pay

## 2022-02-17 ENCOUNTER — Other Ambulatory Visit: Payer: Self-pay

## 2022-02-17 ENCOUNTER — Inpatient Hospital Stay (HOSPITAL_COMMUNITY)
Admission: EM | Admit: 2022-02-17 | Discharge: 2022-02-25 | DRG: 189 | Disposition: A | Discharge status: Home / Self Care | Payer: Medicare Other | Attending: Internal Medicine | Admitting: Internal Medicine

## 2022-02-17 ENCOUNTER — Emergency Department (HOSPITAL_COMMUNITY): Payer: Medicare Other

## 2022-02-17 DIAGNOSIS — J439 Emphysema, unspecified: Secondary | ICD-10-CM | POA: Diagnosis present

## 2022-02-17 DIAGNOSIS — Z23 Encounter for immunization: Secondary | ICD-10-CM

## 2022-02-17 DIAGNOSIS — Z8249 Family history of ischemic heart disease and other diseases of the circulatory system: Secondary | ICD-10-CM

## 2022-02-17 DIAGNOSIS — C3491 Malignant neoplasm of unspecified part of right bronchus or lung: Secondary | ICD-10-CM | POA: Diagnosis present

## 2022-02-17 DIAGNOSIS — R0602 Shortness of breath: Secondary | ICD-10-CM

## 2022-02-17 DIAGNOSIS — Z801 Family history of malignant neoplasm of trachea, bronchus and lung: Secondary | ICD-10-CM

## 2022-02-17 DIAGNOSIS — J441 Chronic obstructive pulmonary disease with (acute) exacerbation: Secondary | ICD-10-CM | POA: Diagnosis present

## 2022-02-17 DIAGNOSIS — E785 Hyperlipidemia, unspecified: Secondary | ICD-10-CM | POA: Diagnosis present

## 2022-02-17 DIAGNOSIS — Z79899 Other long term (current) drug therapy: Secondary | ICD-10-CM

## 2022-02-17 DIAGNOSIS — Z1152 Encounter for screening for COVID-19: Secondary | ICD-10-CM

## 2022-02-17 DIAGNOSIS — D6481 Anemia due to antineoplastic chemotherapy: Secondary | ICD-10-CM | POA: Diagnosis present

## 2022-02-17 DIAGNOSIS — Z7951 Long term (current) use of inhaled steroids: Secondary | ICD-10-CM

## 2022-02-17 DIAGNOSIS — Z833 Family history of diabetes mellitus: Secondary | ICD-10-CM

## 2022-02-17 DIAGNOSIS — Z681 Body mass index (BMI) 19 or less, adult: Secondary | ICD-10-CM

## 2022-02-17 DIAGNOSIS — F419 Anxiety disorder, unspecified: Secondary | ICD-10-CM | POA: Diagnosis present

## 2022-02-17 DIAGNOSIS — T451X5A Adverse effect of antineoplastic and immunosuppressive drugs, initial encounter: Secondary | ICD-10-CM | POA: Diagnosis present

## 2022-02-17 DIAGNOSIS — R197 Diarrhea, unspecified: Secondary | ICD-10-CM | POA: Diagnosis not present

## 2022-02-17 DIAGNOSIS — Z86711 Personal history of pulmonary embolism: Secondary | ICD-10-CM | POA: Diagnosis present

## 2022-02-17 DIAGNOSIS — Z885 Allergy status to narcotic agent status: Secondary | ICD-10-CM

## 2022-02-17 DIAGNOSIS — E876 Hypokalemia: Secondary | ICD-10-CM | POA: Diagnosis present

## 2022-02-17 DIAGNOSIS — Z83438 Family history of other disorder of lipoprotein metabolism and other lipidemia: Secondary | ICD-10-CM

## 2022-02-17 DIAGNOSIS — I700 Atherosclerosis of aorta: Secondary | ICD-10-CM | POA: Diagnosis present

## 2022-02-17 DIAGNOSIS — Z7901 Long term (current) use of anticoagulants: Secondary | ICD-10-CM

## 2022-02-17 DIAGNOSIS — D649 Anemia, unspecified: Secondary | ICD-10-CM | POA: Diagnosis present

## 2022-02-17 DIAGNOSIS — Z822 Family history of deafness and hearing loss: Secondary | ICD-10-CM

## 2022-02-17 DIAGNOSIS — Z86010 Personal history of colonic polyps: Secondary | ICD-10-CM

## 2022-02-17 DIAGNOSIS — R64 Cachexia: Secondary | ICD-10-CM | POA: Diagnosis present

## 2022-02-17 DIAGNOSIS — K921 Melena: Secondary | ICD-10-CM | POA: Diagnosis not present

## 2022-02-17 DIAGNOSIS — F17210 Nicotine dependence, cigarettes, uncomplicated: Secondary | ICD-10-CM | POA: Diagnosis present

## 2022-02-17 DIAGNOSIS — R0902 Hypoxemia: Secondary | ICD-10-CM

## 2022-02-17 DIAGNOSIS — N182 Chronic kidney disease, stage 2 (mild): Secondary | ICD-10-CM | POA: Diagnosis not present

## 2022-02-17 DIAGNOSIS — I77810 Thoracic aortic ectasia: Secondary | ICD-10-CM | POA: Diagnosis present

## 2022-02-17 DIAGNOSIS — J9601 Acute respiratory failure with hypoxia: Secondary | ICD-10-CM | POA: Diagnosis not present

## 2022-02-17 DIAGNOSIS — Z8261 Family history of arthritis: Secondary | ICD-10-CM

## 2022-02-17 DIAGNOSIS — Z86718 Personal history of other venous thrombosis and embolism: Secondary | ICD-10-CM

## 2022-02-17 DIAGNOSIS — Z800 Family history of malignant neoplasm of digestive organs: Secondary | ICD-10-CM

## 2022-02-17 DIAGNOSIS — J449 Chronic obstructive pulmonary disease, unspecified: Secondary | ICD-10-CM | POA: Diagnosis present

## 2022-02-17 LAB — CBC WITH DIFFERENTIAL/PLATELET
Abs Immature Granulocytes: 0.09 10*3/uL — ABNORMAL HIGH (ref 0.00–0.07)
Basophils Absolute: 0 10*3/uL (ref 0.0–0.1)
Basophils Relative: 0 %
Eosinophils Absolute: 1.7 10*3/uL — ABNORMAL HIGH (ref 0.0–0.5)
Eosinophils Relative: 12 %
HCT: 27.5 % — ABNORMAL LOW (ref 36.0–46.0)
Hemoglobin: 9 g/dL — ABNORMAL LOW (ref 12.0–15.0)
Immature Granulocytes: 1 %
Lymphocytes Relative: 16 %
Lymphs Abs: 2.2 10*3/uL (ref 0.7–4.0)
MCH: 32.4 pg (ref 26.0–34.0)
MCHC: 32.7 g/dL (ref 30.0–36.0)
MCV: 98.9 fL (ref 80.0–100.0)
Monocytes Absolute: 1.2 10*3/uL — ABNORMAL HIGH (ref 0.1–1.0)
Monocytes Relative: 9 %
Neutro Abs: 8.5 10*3/uL — ABNORMAL HIGH (ref 1.7–7.7)
Neutrophils Relative %: 62 %
Platelets: 370 10*3/uL (ref 150–400)
RBC: 2.78 MIL/uL — ABNORMAL LOW (ref 3.87–5.11)
RDW: 16.7 % — ABNORMAL HIGH (ref 11.5–15.5)
WBC: 13.7 10*3/uL — ABNORMAL HIGH (ref 4.0–10.5)
nRBC: 0 % (ref 0.0–0.2)

## 2022-02-17 LAB — COMPREHENSIVE METABOLIC PANEL
ALT: 11 U/L (ref 0–44)
AST: 23 U/L (ref 15–41)
Albumin: 3.7 g/dL (ref 3.5–5.0)
Alkaline Phosphatase: 79 U/L (ref 38–126)
Anion gap: 8 (ref 5–15)
BUN: 14 mg/dL (ref 8–23)
CO2: 24 mmol/L (ref 22–32)
Calcium: 9.3 mg/dL (ref 8.9–10.3)
Chloride: 106 mmol/L (ref 98–111)
Creatinine, Ser: 1.04 mg/dL — ABNORMAL HIGH (ref 0.44–1.00)
GFR, Estimated: 59 mL/min — ABNORMAL LOW (ref >60)
Glucose, Bld: 116 mg/dL — ABNORMAL HIGH (ref 70–99)
Potassium: 3.1 mmol/L — ABNORMAL LOW (ref 3.5–5.1)
Sodium: 138 mmol/L (ref 135–145)
Total Bilirubin: 0.5 mg/dL (ref 0.3–1.2)
Total Protein: 7.6 g/dL (ref 6.5–8.1)

## 2022-02-17 LAB — TROPONIN I (HIGH SENSITIVITY)
Troponin I (High Sensitivity): 6 ng/L (ref <18)
Troponin I (High Sensitivity): 6 ng/L (ref <18)

## 2022-02-17 LAB — RESP PANEL BY RT-PCR (RSV, FLU A&B, COVID)  RVPGX2
Influenza A by PCR: NEGATIVE
Influenza B by PCR: NEGATIVE
Resp Syncytial Virus by PCR: NEGATIVE
SARS Coronavirus 2 by RT PCR: NEGATIVE

## 2022-02-17 LAB — BRAIN NATRIURETIC PEPTIDE: B Natriuretic Peptide: 85.5 pg/mL (ref 0.0–100.0)

## 2022-02-17 LAB — BLOOD GAS, VENOUS
Acid-base deficit: 1.8 mmol/L (ref 0.0–2.0)
Bicarbonate: 24.7 mmol/L (ref 20.0–28.0)
O2 Saturation: 64.3 %
Patient temperature: 37.1
pCO2, Ven: 48 mmHg (ref 44–60)
pH, Ven: 7.32 (ref 7.25–7.43)
pO2, Ven: 39 mmHg (ref 32–45)

## 2022-02-17 MED ORDER — POTASSIUM CHLORIDE CRYS ER 20 MEQ PO TBCR
20.0000 meq | EXTENDED_RELEASE_TABLET | Freq: Two times a day (BID) | ORAL | Status: DC
  Administered 2022-02-18: 20 meq via ORAL
  Filled 2022-02-17: qty 1

## 2022-02-17 MED ORDER — IPRATROPIUM-ALBUTEROL 0.5-2.5 (3) MG/3ML IN SOLN
3.0000 mL | Freq: Four times a day (QID) | RESPIRATORY_TRACT | Status: DC
  Administered 2022-02-18 – 2022-02-22 (×19): 3 mL via RESPIRATORY_TRACT
  Filled 2022-02-17 (×18): qty 3

## 2022-02-17 MED ORDER — IPRATROPIUM-ALBUTEROL 0.5-2.5 (3) MG/3ML IN SOLN
9.0000 mL | Freq: Once | RESPIRATORY_TRACT | Status: AC
  Administered 2022-02-17: 9 mL via RESPIRATORY_TRACT
  Filled 2022-02-17: qty 9

## 2022-02-17 MED ORDER — LACTATED RINGERS IV BOLUS
1000.0000 mL | Freq: Once | INTRAVENOUS | Status: AC
  Administered 2022-02-17: 1000 mL via INTRAVENOUS

## 2022-02-17 MED ORDER — METHYLPREDNISOLONE SODIUM SUCC 40 MG IJ SOLR
40.0000 mg | Freq: Two times a day (BID) | INTRAMUSCULAR | Status: DC
  Administered 2022-02-18 – 2022-02-19 (×3): 40 mg via INTRAVENOUS
  Filled 2022-02-17 (×3): qty 1

## 2022-02-17 MED ORDER — BUDESON-GLYCOPYRROL-FORMOTEROL 160-9-4.8 MCG/ACT IN AERO: 2.0000 | INHALATION_SPRAY | Freq: Two times a day (BID) | RESPIRATORY_TRACT | Status: DC

## 2022-02-17 MED ORDER — ACETAMINOPHEN 650 MG RE SUPP: 650.0000 mg | Freq: Four times a day (QID) | RECTAL | Status: DC | PRN

## 2022-02-17 MED ORDER — IOHEXOL 350 MG/ML SOLN
75.0000 mL | Freq: Once | INTRAVENOUS | Status: AC | PRN
  Administered 2022-02-17: 75 mL via INTRAVENOUS

## 2022-02-17 MED ORDER — GUAIFENESIN ER 600 MG PO TB12
600.0000 mg | ORAL_TABLET | Freq: Two times a day (BID) | ORAL | Status: DC
  Administered 2022-02-18 – 2022-02-25 (×16): 600 mg via ORAL
  Filled 2022-02-17 (×16): qty 1

## 2022-02-17 MED ORDER — ONDANSETRON HCL 4 MG PO TABS: 4.0000 mg | ORAL_TABLET | Freq: Four times a day (QID) | ORAL | Status: DC | PRN

## 2022-02-17 MED ORDER — AZITHROMYCIN 250 MG PO TABS
500.0000 mg | ORAL_TABLET | Freq: Every day | ORAL | Status: AC
  Administered 2022-02-18 – 2022-02-21 (×4): 500 mg via ORAL
  Filled 2022-02-17 (×5): qty 2

## 2022-02-17 MED ORDER — POTASSIUM CHLORIDE CRYS ER 20 MEQ PO TBCR
40.0000 meq | EXTENDED_RELEASE_TABLET | Freq: Once | ORAL | Status: AC
  Administered 2022-02-17: 40 meq via ORAL
  Filled 2022-02-17: qty 2

## 2022-02-17 MED ORDER — ONDANSETRON HCL 4 MG/2ML IJ SOLN: 4.0000 mg | Freq: Four times a day (QID) | INTRAMUSCULAR | Status: DC | PRN

## 2022-02-17 MED ORDER — SODIUM CHLORIDE 0.9 % IV SOLN
2.0000 g | Freq: Once | INTRAVENOUS | Status: AC
  Administered 2022-02-17: 2 g via INTRAVENOUS
  Filled 2022-02-17: qty 20

## 2022-02-17 MED ORDER — APIXABAN 5 MG PO TABS
5.0000 mg | ORAL_TABLET | Freq: Two times a day (BID) | ORAL | Status: DC
  Administered 2022-02-18 – 2022-02-25 (×16): 5 mg via ORAL
  Filled 2022-02-17 (×16): qty 1

## 2022-02-17 MED ORDER — MAGNESIUM OXIDE -MG SUPPLEMENT 400 (240 MG) MG PO TABS
400.0000 mg | ORAL_TABLET | Freq: Every day | ORAL | Status: DC
  Administered 2022-02-18 – 2022-02-25 (×8): 400 mg via ORAL
  Filled 2022-02-17 (×8): qty 1

## 2022-02-17 MED ORDER — ACETAMINOPHEN 325 MG PO TABS
650.0000 mg | ORAL_TABLET | Freq: Four times a day (QID) | ORAL | Status: DC | PRN
  Administered 2022-02-20 – 2022-02-24 (×4): 650 mg via ORAL
  Filled 2022-02-17 (×4): qty 2

## 2022-02-17 MED ORDER — METHYLPREDNISOLONE SODIUM SUCC 125 MG IJ SOLR
125.0000 mg | Freq: Once | INTRAMUSCULAR | Status: AC
  Administered 2022-02-17: 125 mg via INTRAVENOUS
  Filled 2022-02-17: qty 2

## 2022-02-17 MED ORDER — MOMETASONE FURO-FORMOTEROL FUM 100-5 MCG/ACT IN AERO
2.0000 | INHALATION_SPRAY | Freq: Two times a day (BID) | RESPIRATORY_TRACT | Status: DC
  Administered 2022-02-18 – 2022-02-25 (×14): 2 via RESPIRATORY_TRACT
  Filled 2022-02-17: qty 8.8

## 2022-02-17 MED ORDER — SENNOSIDES-DOCUSATE SODIUM 8.6-50 MG PO TABS: 1.0000 | ORAL_TABLET | Freq: Every evening | ORAL | Status: DC | PRN

## 2022-02-17 MED ORDER — ALBUTEROL SULFATE (2.5 MG/3ML) 0.083% IN NEBU
2.5000 mg | INHALATION_SOLUTION | RESPIRATORY_TRACT | Status: DC | PRN
  Administered 2022-02-18 – 2022-02-23 (×2): 2.5 mg via RESPIRATORY_TRACT
  Filled 2022-02-17 (×2): qty 3

## 2022-02-17 MED ORDER — SODIUM CHLORIDE 0.9 % IV SOLN
500.0000 mg | INTRAVENOUS | Status: AC
  Administered 2022-02-18: 500 mg via INTRAVENOUS
  Filled 2022-02-17: qty 5

## 2022-02-17 MED ORDER — UMECLIDINIUM BROMIDE 62.5 MCG/ACT IN AEPB
1.0000 | INHALATION_SPRAY | Freq: Every day | RESPIRATORY_TRACT | Status: DC
  Administered 2022-02-19 – 2022-02-25 (×7): 1 via RESPIRATORY_TRACT
  Filled 2022-02-17: qty 7

## 2022-02-17 NOTE — ED Notes (Signed)
Pt requesting to be repositioned, wanting to stand up and walk advised that right now that was not a good idea. Let the RN know as well pt was ok with not be able to stand and walk

## 2022-02-17 NOTE — Telephone Encounter (Signed)
Called patient regarding January and February appointments, patient is notified.

## 2022-02-17 NOTE — Assessment & Plan Note (Signed)
Oral supplement ordered.  Check magnesium and supplement further if needed.

## 2022-02-17 NOTE — ED Provider Notes (Signed)
[name] Note   CSN: [id number] Arrival date & time: 02/17/22  1854     History  Chief Complaint  Patient presents with   Shortness of Breath    Sue Chase is a 66 y.o. female.  With PMH of stage IV right lung adenocarcinoma on chemotherapy last chemotherapy 02/15/2022, history of PE/DVT on Eliquis, COPD not on oxygen at baseline who presents with acute worsening shortness of breath today associate with cough.  With EMS, satting low 90s on room air very tachypneic.  Patient feels significantly worse today complaining of severe shortness of breath and inability to catch her breath.  She denies any associated chest pain or pleurisy with it.  She has had some associated chest congestion and coarse cough.  She is not on oxygen at baseline.  She has no increased pain or swelling in lower extremities.  No hemoptysis.  She is compliant with her blood thinners and is due for her next dose tonight.  She has not been on antibiotics or steroids recently.  She does use Pulmicort at home.  She has had no fevers, no vomiting, no diarrhea.   Shortness of Breath      Home Medications Prior to Admission medications   Medication Sig Start Date End Date Taking? Authorizing Provider  albuterol (VENTOLIN HFA) 108 (90 Base) MCG/ACT inhaler Inhale 2 puffs into the lungs every 6 (six) hours as needed for wheezing or shortness of breath. 01/28/22   Icard, Octavio Graves, DO  Budeson-Glycopyrrol-Formoterol (BREZTRI AEROSPHERE) 160-9-4.8 MCG/ACT AERO Inhale 2 puffs into the lungs in the morning and at bedtime. 01/28/22   Icard, Bradley L, DO  ELIQUIS 5 MG TABS tablet TAKE 1 TABLET BY MOUTH TWICE A DAY 01/18/22   Nafziger, Tommi Rumps, NP  FeFum-FePoly-FA-B Cmp-C-Biot (INTEGRA PLUS) CAPS Take 1 capsule by mouth every morning. 02/15/22   Heilingoetter, Cassandra L, PA-C  folic acid (FOLVITE) 1 MG tablet TAKE 1 TABLET BY MOUTH DAILY 01/16/22   Heilingoetter, Cassandra L,  PA-C  lidocaine-prilocaine (EMLA) cream Apply to the Port-A-Cath site 30 minutes before chemotherapy Patient taking differently: Apply 1 Application topically daily as needed. Apply to the Port-A-Cath site 30 minutes before chemotherapy 08/03/21   Curt Bears, MD  magnesium oxide (MAG-OX) 400 (240 Mg) MG tablet Take 400 mg by mouth daily.    [provider]  nicotine (NICODERM CQ - DOSED IN MG/24 HOURS) 14 mg/24hr patch 14 mg daily. 11/30/21   [provider]  omeprazole (PRILOSEC) 20 MG capsule Take 1 capsule (20 mg total) by mouth daily. 11/20/17   Nafziger, Tommi Rumps, NP  potassium chloride SA (KLOR-CON M) 20 MEQ tablet Take 1 tablet (20 mEq total) by mouth 2 (two) times daily. 02/15/22   Heilingoetter, Cassandra L, PA-C  prochlorperazine (COMPAZINE) 10 MG tablet Take 1 tablet (10 mg total) by mouth every 6 (six) hours as needed for nausea or vomiting. 08/03/21   Curt Bears, MD  [facility] 160-4.5 MCG/ACT inhaler Inhale into the lungs. 01/28/22   [provider]      Allergies    Codeine    Review of Systems   Review of Systems  Respiratory:  Positive for shortness of breath.     Physical Exam Updated Vital Signs BP (!) 112/58   Pulse (!) 103   Temp 97.9 F (36.6 C) (Oral)   Resp 17   SpO2 97%  Physical Exam Constitutional: Alert and oriented.  Cachectic, ill appearing female, GCS 15 Eyes: Conjunctivae are  normal. ENT      Head: Normocephalic and atraumatic.      Neck: No stridor. Cardiovascular: S1, S2, tachycardic, regular rhythm.Warm and well perfused. Respiratory: Tachypneic to the high 20s/low 30s, bilateral coarse breath sounds with faint wheeze, O2 sat 100% on 3 L nasal cannula Gastrointestinal: Soft and nondistended Musculoskeletal: Normal range of motion in all extremities. No pitting edema of lower extremities Neurologic: Normal speech and language.  Moving all extremities equally.  Sensation grossly intact.  No facial droop.  No gross  focal neurologic deficits are appreciated. Skin: Skin is warm, dry and intact. No rash noted. Psychiatric: Mood and affect are normal. Speech and behavior are normal.  ED Results / Procedures / Treatments   Labs (all labs ordered are listed, but only abnormal results are displayed) Labs Reviewed  COMPREHENSIVE METABOLIC PANEL - Abnormal; Notable for the following components:      Result Value   Potassium 3.1 (*)    Glucose, Bld 116 (*)    Creatinine, Ser 1.04 (*)    GFR, Estimated 59 (*)    All other components within normal limits  CBC WITH DIFFERENTIAL/PLATELET - Abnormal; Notable for the following components:   WBC 13.7 (*)    RBC 2.78 (*)    Hemoglobin 9.0 (*)    HCT 27.5 (*)    RDW 16.7 (*)    Neutro Abs 8.5 (*)    Monocytes Absolute 1.2 (*)    Eosinophils Absolute 1.7 (*)    Abs Immature Granulocytes 0.09 (*)    All other components within normal limits  RESP PANEL BY RT-PCR (RSV, FLU A&B, COVID)  RVPGX2  BRAIN NATRIURETIC PEPTIDE  BLOOD GAS, VENOUS  URINALYSIS, ROUTINE W REFLEX MICROSCOPIC  TROPONIN I (HIGH SENSITIVITY)  TROPONIN I (HIGH SENSITIVITY)    EKG EKG Interpretation  Date/Time:  Thursday February 17 2022 19:24:55 EST Ventricular Rate:  98 PR Interval:  165 QRS Duration: 79 QT Interval:  377 QTC Calculation: 472 R Axis:   81 Text Interpretation: Poor quality data, interpretation may be affected Sinus rhythm Paired ventricular premature complexes Borderline right axis deviation Borderline low voltage, extremity leads Anteroseptal infarct, old Borderline repolarization abnormality Artifact in lead(s) I II III aVR aVL aVF V1 V2 V3 V4 V5 V6 Confirmed by Georgina Snell [phone]) on 02/17/2022 10:40:01 PM  Radiology CT Angio Chest PE W and/or Wo Contrast  Result Date: 02/17/2022 CLINICAL DATA:  Cancer patient with worsening shortness of breath. Prior history of pulmonary embolism. Weakness and fatigue onset today. EXAM: CT ANGIOGRAPHY CHEST WITH CONTRAST  TECHNIQUE: Multidetector CT imaging of the chest was performed using the standard protocol during bolus administration of intravenous contrast. Multiplanar CT image reconstructions and MIPs were obtained to evaluate the vascular anatomy. RADIATION DOSE REDUCTION: This exam was performed according to the departmental dose-optimization program which includes automated exposure control, adjustment of the mA and/or kV according to patient size and/or use of iterative reconstruction technique. CONTRAST:  39mL OMNIPAQUE IOHEXOL 350 MG/ML SOLN COMPARISON:  PET-CT 06/25/2021, CT chest with contrast more recently 12/30/2021. FINDINGS: Cardiovascular: Right IJ port catheter terminates in the distal SVC. The cardiac size is normal. Scattered single-vessel coronary artery calcification noted in the LAD. There is again a small pericardial effusion anteriorly. Pulmonary arteries and veins are normal caliber no arterial embolism is seen. There is aortic atherosclerosis, primarily in the arch where there is mixed soft and calcific plaque again noted with increasingly ulcerative soft plaque along the anterior inferior wall of the arch. There  is scattered calcification in the great vessels. There is no aortic or great vessel dissection or stenosis. Slight dilatation again noted in the ascending aorta to 4 cm, stable. Remainder within normal caliber limits. Mediastinum/Nodes: Borderline prominent hilar lymph nodes are again noted up to 9 mm in short axis with a few slightly prominent right paratracheal nodes up to 1 cm in short axis. No other adenopathy is seen. No abnormality is seen thoracic trachea, thoracic esophagus and thyroid. Lungs/Pleura: Mixed attenuation semisolid lesion abutting the fissure in the posterior segment of the right upper lobe is stable to slightly increased in size, current measurements of 3.4 x 2.1 cm on 6:46, at a similar level previously having measured 3.2 x 2 cm. Associated architectural distortion is  similar, with the adjacent fissure invaginated towards the lesion as before. Spiculations surrounding this lesion appear similar. There are innumerable bilateral nodular ground-glass lesions suspected to be intraorgan metastases. There is a more solid nodule abutting the fissure in the anterior basal segment of the right upper lobe, on 6:105 today measuring 7.3 mm, previously 6.3 mm. Additional areas of ground-glass subsolid disease include a lesion measuring 2 cm in the right lower lobe on 6:74, unchanged, 1.4 cm right lower lobe ground-glass lesion anteriorly on 6:81 also unchanged, and a left lower lobe 2 cm ground-glass lesion on 6:109 which is also unchanged. Innumerable much smaller ground-glass lesions throughout both lungs appear to have increased in number. Diffuse bronchial thickening is noted and mild emphysematous disease with additional scar-like opacities in the bases. There is no pleural effusion.  No airspace consolidation is seen. Upper Abdomen: No acute findings. Musculoskeletal: Mild-to-moderate superior endplate anterior wedge compression fracture of T9 is unchanged. There is osteopenia and mild degenerative change of the spine. No destructive bone lesion is seen. There is progressive thinning of the body wall fat, mild body wall anasarca, findings concerning for cachexia and malnutrition. Review of the MIP images confirms the above findings. IMPRESSION: 1. No evidence of arterial embolus. 2. Aortic atherosclerosis with increasingly ulcerative soft plaque in the arch. 3. Stable 4 cm ascending aortic dilatation. 4. Stable borderline prominent hilar and mediastinal lymph nodes. 5. Stable to slightly increased size of mixed attenuation semisolid spiculated lesion in the posterior segment of the right upper lobe. 6. Innumerable bilateral ground-glass nodular lesions suspected to be intraorgan metastases, and appear to have increased in number. 7. Diffuse bronchial thickening and mild emphysema. 8.  Progressive thinning of the body wall fat, mild body wall anasarca, findings concerning for cachexia and malnutrition. 9. Stable T9 superior endplate anterior wedge compression fracture. Aortic Atherosclerosis (ICD10-I70.0) and Emphysema (ICD10-J43.9). Electronically Signed   By: Telford Nab M.D.   On: 02/17/2022 22:02   DG Chest 2 View  Result Date: 02/17/2022 CLINICAL DATA:  Lung cancer, dyspnea EXAM: CHEST - 2 VIEW COMPARISON:  06/08/2021 FINDINGS: Limb defined left upper lobe opacity has decreased in size since prior examination possibly related to interval response to therapy. The lungs are symmetrically hyperinflated. No new focal pulmonary nodules or infiltrates. No pneumothorax or pleural effusion. Cardiac size within normal limits. Right internal jugular chest port tip noted within the superior vena cava. Pulmonary vascularity is normal. No acute bone abnormality. IMPRESSION: 1. No radiographic evidence of acute cardiopulmonary disease. 2. COPD 3. Interval decrease in size of left upper lobe opacity, possibly related to interval response to therapy. This could be better assessed with repeat CT imaging if indicated. Electronically Signed   By: Linwood Dibbles.D.  On: 02/17/2022 19:52    Procedures .Critical Care  Performed by: Elgie Congo, MD Authorized by: Elgie Congo, MD   Critical care provider statement:    Critical care time (minutes):  30   Critical care was necessary to treat or prevent imminent or life-threatening deterioration of the following conditions:  Respiratory failure   Critical care was time spent personally by me on the following activities:  Development of treatment plan with patient or surrogate, discussions with consultants, evaluation of patient's response to treatment, examination of patient, ordering and review of laboratory studies, ordering and review of radiographic studies, ordering and performing treatments and interventions, pulse oximetry,  re-evaluation of patient's condition, review of old charts and obtaining history from patient or surrogate   Care discussed with: admitting provider       Medications Ordered in ED Medications  ipratropium-albuterol (DUONEB) 0.5-2.5 (3) MG/3ML nebulizer solution 9 mL (9 mLs Nebulization Given 02/17/22 2016)  methylPREDNISolone sodium succinate (SOLU-MEDROL) 125 mg/2 mL injection 125 mg (125 mg Intravenous Given 02/17/22 2022)  cefTRIAXone (ROCEPHIN) 2 g in sodium chloride 0.9 % 100 mL IVPB (0 g Intravenous Stopped 02/17/22 2045)  iohexol (OMNIPAQUE) 350 MG/ML injection 75 mL (75 mLs Intravenous Contrast Given 02/17/22 2115)  potassium chloride SA (KLOR-CON M) CR tablet 40 mEq (40 mEq Oral Given 02/17/22 2218)  lactated ringers bolus 1,000 mL (1,000 mLs Intravenous New Bag/Given 02/17/22 2220)    ED Course/ Medical Decision Making/ A&P Clinical Course as of 02/17/22 2306  Thu Feb 17, 2022  2237 Patient's work of breathing and aeration have improved with steroids and DuoNebs she also endorses improvement.  However while trialing patient off oxygen, she desatted to the mid to high 80s.  She has not been ambulated due to generalized fatigue and weakness.  Reaching out to hospitalist for admission for management of COPD exacerbation in the setting of worsening metastatic disease/lung cancer. [VB]  2255 Discussed with Dr. Maryln Manuel who is excepted patient for admission for suspected COPD exacerbation in the setting of worsening lung cancer. [VB]    Clinical Course User Index [VB] Elgie Congo, MD                           Medical Decision Making Sue Chase is a 66 y.o. female.  With PMH of stage IV right lung adenocarcinoma on chemotherapy last chemotherapy 02/15/2022, history of PE/DVT on Eliquis, COPD not on oxygen at baseline who presents with acute worsening shortness of breath today associate with cough.  With EMS, satting low 90s on room air very tachypneic.   Based on  patient's history and presentation, there could be multiple underlying etiologies contributing to patient's shortness of breath.  Consider possible worsening lung cancer versus COPD exacerbation versus bacterial pneumonia versus viral URI versus PE among multiple other etiologies.  Labs obtained which I personally reviewed  Chest x-ray obtained which I personally reviewed, no consolidation concerning for pneumonia, no pneumothorax, no pleural effusions noted.  She did have interval decrease in left upper lobe opacity.  Per radiology findings consistent with COPD.  Due to acute onset of shortness of breath and history of PE despite compliance with anticoagulation, CTA PE study was obtained which showed worsening metastatic disease and increased size of right upper lobe lesion.  No consolidation concerning for pneumonia, no PE.  She had a mild leukocytosis 13.7 with left shift.  Her high sensitive troponin was 6 and reassuring with no  acute ST/T changes on EKG, no concern for ACS.  VBG without hypercarbia pH 7.32 pCO2 48.  BNP 85 unremarkable.  Mild hypokalemia 3.1 repleted in the ED orally with slightly elevated creatinine 1.04 given IV fluids.  Suspect COPD exacerbation in the setting of worsening lung cancer.  She was given 3 times DuoNebs, IV Solu-Medrol and IV Rocephin with improvement of symptoms however when watched off oxygen she was desat into the mid 80s.  Admitted to hospitalist overnight for continued management of symptoms and new hypoxia in the setting of worsening lung cancer.    Amount and/or Complexity of Data Reviewed Labs: ordered. Radiology: ordered.  Risk Prescription drug management. Decision regarding hospitalization.    Final Clinical Impression(s) / ED Diagnoses Final diagnoses:  COPD exacerbation ([address])  Hypoxia  SOB (shortness of breath)  Hypokalemia    Rx / DC Orders ED Discharge Orders     None         Elgie Congo, MD 02/17/22 2306

## 2022-02-17 NOTE — Assessment & Plan Note (Signed)
-   Continue Eliquis 

## 2022-02-17 NOTE — Assessment & Plan Note (Addendum)
COPD with acute exacerbation Primarily secondary to acute COPD exacerbation but also from known lung cancer.  SpO2 down to 87% on RA, maintaining SpO2 >92% on 2 L via [place].  Symptoms improving with initial steroids and nebulizer treatment. -Continue IV Solu-Medrol 40 mg twice daily -Schedule DuoNeb with albuterol as needed -Continue Breztri -Start azithromycin -Continue supplemental O2 as needed and wean as able

## 2022-02-17 NOTE — Assessment & Plan Note (Signed)
Hemoglobin stable at 9.0.

## 2022-02-17 NOTE — ED Triage Notes (Signed)
Pt. BIB [facility] from home c/o SOB, weakness and fatigue that started today. Clear lungs bilaterally per EMS. Hx of lung cancer.  EMS VS:  BP: 134/80 HR: 90 RR: 20 O2: 91% RA

## 2022-02-17 NOTE — H&P (Signed)
History and Physical    Sue Chase KZS:[id number] DOB: August 22, 1955 DOA: 02/17/2022  PCP: [name], NP  Patient coming from: Home  I have personally briefly reviewed patient's old medical records in [place]  Chief Complaint: Shortness of breath  HPI: Sue Chase is a 66 y.o. female with medical history significant for stage IV right lung adenocarcinoma on active chemotherapy, COPD, PE/DVT on Eliquis, anemia, ascending aortic dilatation who presented to the ED for evaluation of dyspnea.  Patient has had some dyspnea related to her COPD and lung cancer recently well-managed with Symbicort and albuterol as needed.  She developed severe dyspnea while at rest around 2 PM on 12/28.  This was not relieved with her rescue inhaler.  She subsequently developed cough productive of yellow sputum.  She has not had any chest pain, nausea, vomiting, abdominal pain.  She does not require supplemental oxygen at baseline.  EMS were called and per ED documentation she was found to have SpO2 low 90s on room air and very tachypneic.  She was brought to the ED for further evaluation.  ED Course  Labs/Imaging on admission: I have personally reviewed following labs and imaging studies.  Initial vitals showed BP 142/77, pulse 107, RR 27, temp 97.9 F, SpO2 100% on room air.  Labs show WBC 13.7, hemoglobin 9.0, platelets 370,000, sodium 138, potassium 3.1, bicarb 24, BUN 14, creatinine 1.04, serum glucose 116, LFTs within normal limits, troponin negative x 2, BNP 85.5.  VBG shows pH 7.32, pCO2 48, pO2 39.  SARS-CoV-2, influenza, RSV PCR negative.  CTA chest negative for evidence of arterial embolus.  Aortic atherosclerosis with increasingly ulcerated soft plaque in the aorta seen.  Stable 4 cm ascending aortic dilatation noted.  Stable prominent hilar and distal lymph nodes, slightly increased size of spiculated lesion in posterior right upper lobe noted.  Increased number of innumerable  bilateral groundglass nodular regions suspected to be intra organ metastases noted.  Bronchial wall thickening, mild emphysema, and findings of cachexia and malnutrition noted.  Stable T9 superior endplate anterior wedge compression fracture reported.  Patient was noted to desaturate to mid to high 80s on room air.  Patient was given IV Solu-Medrol 125 mg 1 L LR, DuoNeb, IV ceftriaxone, oral K 40 mEq.  The hospitalist service was consulted to admit for further evaluation and management  Review of Systems: All systems reviewed and are negative except as documented in history of present illness above.   Past Medical History:  Diagnosis Date   Colon polyps    Complication of anesthesia    tolerated propofol but other meds made her emotional / cry/ difficulty breathing/ panic   Hyperlipidemia    Rheumatic fever    Seasonal allergies    UTI (urinary tract infection)     Past Surgical History:  Procedure Laterality Date   BRONCHIAL BIOPSY  06/08/2021   Procedure: BRONCHIAL BIOPSIES;  Surgeon: Garner Nash, DO;  Location: [facility];  Service: Pulmonary;;   BRONCHIAL BRUSHINGS  06/08/2021   Procedure: BRONCHIAL BRUSHINGS;  Surgeon: Garner Nash, DO;  Location: [facility];  Service: Pulmonary;;   BRONCHIAL NEEDLE ASPIRATION BIOPSY  06/08/2021   Procedure: BRONCHIAL NEEDLE ASPIRATION BIOPSIES;  Surgeon: Garner Nash, DO;  Location: [facility];  Service: Pulmonary;;   COLONOSCOPY  2018   ERCP N/A 11/05/2021   Procedure: ENDOSCOPIC RETROGRADE CHOLANGIOPANCREATOGRAPHY (ERCP);  Surgeon: Irene Shipper, MD;  Location: [facility];  Service: Gastroenterology;  Laterality: N/A;   IR  IMAGING GUIDED PORT INSERTION  08/12/2021   REMOVAL OF STONES  11/05/2021   Procedure: REMOVAL OF STONES;  Surgeon: Irene Shipper, MD;  Location: [facility];  Service: Gastroenterology;;   SALIVARY GLAND SURGERY     LATE 80S   SPHINCTEROTOMY  11/05/2021   Procedure: SPHINCTEROTOMY;  Surgeon: Irene Shipper,  MD;  Location: [facility];  Service: Gastroenterology;;   TUBAL LIGATION  1986   VIDEO BRONCHOSCOPY WITH RADIAL ENDOBRONCHIAL ULTRASOUND  06/08/2021   Procedure: VIDEO BRONCHOSCOPY WITH RADIAL ENDOBRONCHIAL ULTRASOUND;  Surgeon: Garner Nash, DO;  Location: [facility];  Service: Pulmonary;;    Social History:  reports that she has been smoking cigarettes. She has a 40.00 pack-year smoking history. She has never been exposed to tobacco smoke. She has never used smokeless tobacco. She reports current alcohol use. She reports that she does not use drugs.  Allergies  Allergen Reactions   Codeine Itching    Family History  Problem Relation Age of Onset   Arthritis Mother    Diabetes Mother    Heart disease Mother    Hyperlipidemia Mother    Hypertension Mother    Miscarriages / [place] Mother    Hearing loss Father    Liver cancer Father    Lung cancer Father    Hyperlipidemia Father    Diabetes Brother    Lung disease Maternal Grandfather        Black Lung   Diabetes Paternal Grandmother    Cancer Paternal Grandfather    Diabetes Paternal Grandfather      Prior to Admission medications   Medication Sig Start Date End Date Taking? Authorizing Provider  albuterol (VENTOLIN HFA) 108 (90 Base) MCG/ACT inhaler Inhale 2 puffs into the lungs every 6 (six) hours as needed for wheezing or shortness of breath. 01/28/22   Icard, Octavio Graves, DO  Budeson-Glycopyrrol-Formoterol (BREZTRI AEROSPHERE) 160-9-4.8 MCG/ACT AERO Inhale 2 puffs into the lungs in the morning and at bedtime. 01/28/22   Icard, Bradley L, DO  ELIQUIS 5 MG TABS tablet TAKE 1 TABLET BY MOUTH TWICE A DAY 01/18/22   Nafziger, Tommi Rumps, NP  FeFum-FePoly-FA-B Cmp-C-Biot (INTEGRA PLUS) CAPS Take 1 capsule by mouth every morning. 02/15/22   Heilingoetter, Cassandra L, PA-C  folic acid (FOLVITE) 1 MG tablet TAKE 1 TABLET BY MOUTH DAILY 01/16/22   Heilingoetter, Cassandra L, PA-C  lidocaine-prilocaine (EMLA) cream Apply to the  Port-A-Cath site 30 minutes before chemotherapy Patient taking differently: Apply 1 Application topically daily as needed. Apply to the Port-A-Cath site 30 minutes before chemotherapy 08/03/21   Curt Bears, MD  magnesium oxide (MAG-OX) 400 (240 Mg) MG tablet Take 400 mg by mouth daily.    [provider]  nicotine (NICODERM CQ - DOSED IN MG/24 HOURS) 14 mg/24hr patch 14 mg daily. 11/30/21   [provider]  omeprazole (PRILOSEC) 20 MG capsule Take 1 capsule (20 mg total) by mouth daily. 11/20/17   Nafziger, Tommi Rumps, NP  potassium chloride SA (KLOR-CON M) 20 MEQ tablet Take 1 tablet (20 mEq total) by mouth 2 (two) times daily. 02/15/22   Heilingoetter, Cassandra L, PA-C  prochlorperazine (COMPAZINE) 10 MG tablet Take 1 tablet (10 mg total) by mouth every 6 (six) hours as needed for nausea or vomiting. 08/03/21   Curt Bears, MD  [facility] 160-4.5 MCG/ACT inhaler Inhale into the lungs. 01/28/22   [provider]    Physical Exam: Vitals:   02/17/22 2100 02/17/22 2150 02/17/22 2230 02/17/22 2334  BP: 134/64 121/66 Marland Kitchen)  112/58   Pulse: 92 (!) 101 (!) 103   Resp: (!) 28 (!) 26 17   Temp:    97.9 F (36.6 C)  TempSrc:    Oral  SpO2: 98% 95% 97%    Constitutional: Thin woman, sitting up in bed, NAD, calm, comfortable Eyes: EOMI, lids and conjunctivae normal ENMT: Mucous membranes are moist. Posterior pharynx clear of any exudate or lesions.Normal dentition.  Neck: normal, supple, no masses. Respiratory: Distant breath sounds with faint end expiratory wheezing. Normal respiratory effort while on 2 L O2 via [place]. No accessory muscle use.  Cardiovascular: Regular rate and rhythm, no murmurs / rubs / gallops. No extremity edema. 2+ pedal pulses. Abdomen: no tenderness, no masses palpated.  Musculoskeletal: no clubbing / cyanosis. No joint deformity upper and lower extremities. Good ROM, no contractures. Normal muscle tone.  Skin: no rashes, lesions, ulcers. No  induration Neurologic: Sensation intact. Strength 5/5 in all 4.  Psychiatric: Alert and oriented x 3. Normal mood.   EKG: Personally reviewed. Sinus rhythm, motion artifact limiting interpretation.  Assessment/Plan Principal Problem:   Acute respiratory failure with hypoxia (HCC) Active Problems:   COPD with acute exacerbation (HCC)   Adenocarcinoma of right lung, stage 4 (HCC)   Hypokalemia   Normocytic anemia   History of pulmonary embolism   Sue Chase is a 66 y.o. female with medical history significant for stage IV right lung adenocarcinoma on active chemotherapy, COPD, PE/DVT on Eliquis, anemia, ascending aortic dilatation who is admitted with acute respiratory failure with hypoxia due to COPD exacerbation.  Assessment and Plan: * Acute respiratory failure with hypoxia (HCC) COPD with acute exacerbation Primarily secondary to acute COPD exacerbation but also from known lung cancer.  SpO2 down to 87% on RA, maintaining SpO2 >92% on 2 L via [place].  Symptoms improving with initial steroids and nebulizer treatment. -Continue IV Solu-Medrol 40 mg twice daily -Schedule DuoNeb with albuterol as needed -Continue Breztri -Start azithromycin -Continue supplemental O2 as needed and wean as able  Adenocarcinoma of right lung, stage 4 (HCC) Follows with oncology, Dr. Curt Bears.  On active chemotherapy with carboplatin, Alimta, Keytruda.  Last dose 12/26.  CTA chest shows slight increased size of spiculated right upper lobe lesion, and increased number of bilateral groundglass nodular lesion suspected to be intraorgan metastases.  History of pulmonary embolism Continue Eliquis.  Normocytic anemia Hemoglobin stable at 9.0.  Hypokalemia Oral supplement ordered.  Check magnesium and supplement further if needed.  DVT prophylaxis:  apixaban (ELIQUIS) tablet 5 mg   Code Status: Full code, confirmed with patient on admission Family Communication: Discussed with patient, she  has discussed with family Disposition Plan: From home and likely discharge to home pending clinical progress Consults called: None Severity of Illness: The appropriate patient status for this patient is OBSERVATION. Observation status is judged to be reasonable and necessary in order to provide the required intensity of service to ensure the patient's safety. The patient's presenting symptoms, physical exam findings, and initial radiographic and laboratory data in the context of their medical condition is felt to place them at decreased risk for further clinical deterioration. Furthermore, it is anticipated that the patient will be medically stable for discharge from the hospital within 2 midnights of admission.   Zada Finders MD [facility]  If 7PM-7AM, please contact night-coverage [email or web address]  02/17/2022, 11:56 PM

## 2022-02-17 NOTE — Hospital Course (Signed)
Sue Chase is a 66 y.o. female with medical history significant for stage IV right lung adenocarcinoma on active chemotherapy, COPD, PE/DVT on Eliquis, anemia, ascending aortic dilatation who is admitted with acute respiratory failure with hypoxia due to COPD exacerbation.

## 2022-02-17 NOTE — Assessment & Plan Note (Signed)
Follows with oncology, Dr. Curt Bears.  On active chemotherapy with carboplatin, Alimta, Keytruda.  Last dose 12/26.  CTA chest shows slight increased size of spiculated right upper lobe lesion, and increased number of bilateral groundglass nodular lesion suspected to be intraorgan metastases.

## 2022-02-18 ENCOUNTER — Other Ambulatory Visit: Payer: Self-pay

## 2022-02-18 DIAGNOSIS — N182 Chronic kidney disease, stage 2 (mild): Secondary | ICD-10-CM | POA: Diagnosis not present

## 2022-02-18 DIAGNOSIS — K921 Melena: Secondary | ICD-10-CM | POA: Diagnosis not present

## 2022-02-18 DIAGNOSIS — E785 Hyperlipidemia, unspecified: Secondary | ICD-10-CM | POA: Diagnosis present

## 2022-02-18 DIAGNOSIS — F419 Anxiety disorder, unspecified: Secondary | ICD-10-CM | POA: Diagnosis present

## 2022-02-18 DIAGNOSIS — Z822 Family history of deafness and hearing loss: Secondary | ICD-10-CM | POA: Diagnosis not present

## 2022-02-18 DIAGNOSIS — Z8261 Family history of arthritis: Secondary | ICD-10-CM | POA: Diagnosis not present

## 2022-02-18 DIAGNOSIS — J441 Chronic obstructive pulmonary disease with (acute) exacerbation: Secondary | ICD-10-CM

## 2022-02-18 DIAGNOSIS — Z800 Family history of malignant neoplasm of digestive organs: Secondary | ICD-10-CM | POA: Diagnosis not present

## 2022-02-18 DIAGNOSIS — J9601 Acute respiratory failure with hypoxia: Secondary | ICD-10-CM | POA: Diagnosis present

## 2022-02-18 DIAGNOSIS — Z8249 Family history of ischemic heart disease and other diseases of the circulatory system: Secondary | ICD-10-CM | POA: Diagnosis not present

## 2022-02-18 DIAGNOSIS — D649 Anemia, unspecified: Secondary | ICD-10-CM | POA: Diagnosis not present

## 2022-02-18 DIAGNOSIS — T451X5A Adverse effect of antineoplastic and immunosuppressive drugs, initial encounter: Secondary | ICD-10-CM | POA: Diagnosis present

## 2022-02-18 DIAGNOSIS — Z23 Encounter for immunization: Secondary | ICD-10-CM | POA: Diagnosis present

## 2022-02-18 DIAGNOSIS — C3491 Malignant neoplasm of unspecified part of right bronchus or lung: Secondary | ICD-10-CM | POA: Diagnosis not present

## 2022-02-18 DIAGNOSIS — E876 Hypokalemia: Secondary | ICD-10-CM | POA: Diagnosis present

## 2022-02-18 DIAGNOSIS — Z681 Body mass index (BMI) 19 or less, adult: Secondary | ICD-10-CM | POA: Diagnosis not present

## 2022-02-18 DIAGNOSIS — Z801 Family history of malignant neoplasm of trachea, bronchus and lung: Secondary | ICD-10-CM | POA: Diagnosis not present

## 2022-02-18 DIAGNOSIS — R64 Cachexia: Secondary | ICD-10-CM | POA: Diagnosis present

## 2022-02-18 DIAGNOSIS — J449 Chronic obstructive pulmonary disease, unspecified: Secondary | ICD-10-CM | POA: Diagnosis present

## 2022-02-18 DIAGNOSIS — R0609 Other forms of dyspnea: Secondary | ICD-10-CM | POA: Diagnosis not present

## 2022-02-18 DIAGNOSIS — J439 Emphysema, unspecified: Secondary | ICD-10-CM | POA: Diagnosis present

## 2022-02-18 DIAGNOSIS — Z1152 Encounter for screening for COVID-19: Secondary | ICD-10-CM | POA: Diagnosis not present

## 2022-02-18 DIAGNOSIS — I77810 Thoracic aortic ectasia: Secondary | ICD-10-CM | POA: Diagnosis present

## 2022-02-18 DIAGNOSIS — F17210 Nicotine dependence, cigarettes, uncomplicated: Secondary | ICD-10-CM | POA: Diagnosis present

## 2022-02-18 DIAGNOSIS — D6481 Anemia due to antineoplastic chemotherapy: Secondary | ICD-10-CM | POA: Diagnosis present

## 2022-02-18 LAB — BASIC METABOLIC PANEL
Anion gap: 7 (ref 5–15)
BUN: 15 mg/dL (ref 8–23)
CO2: 22 mmol/L (ref 22–32)
Calcium: 8.9 mg/dL (ref 8.9–10.3)
Chloride: 106 mmol/L (ref 98–111)
Creatinine, Ser: 1.04 mg/dL — ABNORMAL HIGH (ref 0.44–1.00)
GFR, Estimated: 59 mL/min — ABNORMAL LOW (ref >60)
Glucose, Bld: 167 mg/dL — ABNORMAL HIGH (ref 70–99)
Potassium: 3.2 mmol/L — ABNORMAL LOW (ref 3.5–5.1)
Sodium: 135 mmol/L (ref 135–145)

## 2022-02-18 LAB — PREPARE RBC (CROSSMATCH)

## 2022-02-18 LAB — URINALYSIS, ROUTINE W REFLEX MICROSCOPIC
Bilirubin Urine: NEGATIVE
Glucose, UA: NEGATIVE mg/dL
Hgb urine dipstick: NEGATIVE
Ketones, ur: 5 mg/dL — AB
Leukocytes,Ua: NEGATIVE
Nitrite: NEGATIVE
Protein, ur: NEGATIVE mg/dL
Specific Gravity, Urine: 1.034 — ABNORMAL HIGH (ref 1.005–1.030)
pH: 5 (ref 5.0–8.0)

## 2022-02-18 LAB — MAGNESIUM: Magnesium: 1.3 mg/dL — ABNORMAL LOW (ref 1.7–2.4)

## 2022-02-18 LAB — CBC
HCT: 22 % — ABNORMAL LOW (ref 36.0–46.0)
Hemoglobin: 7.1 g/dL — ABNORMAL LOW (ref 12.0–15.0)
MCH: 32.9 pg (ref 26.0–34.0)
MCHC: 32.3 g/dL (ref 30.0–36.0)
MCV: 101.9 fL — ABNORMAL HIGH (ref 80.0–100.0)
Platelets: 227 10*3/uL (ref 150–400)
RBC: 2.16 MIL/uL — ABNORMAL LOW (ref 3.87–5.11)
RDW: 16.8 % — ABNORMAL HIGH (ref 11.5–15.5)
WBC: 7.8 10*3/uL (ref 4.0–10.5)
nRBC: 0 % (ref 0.0–0.2)

## 2022-02-18 LAB — HEMOGLOBIN AND HEMATOCRIT, BLOOD
HCT: 29.7 % — ABNORMAL LOW (ref 36.0–46.0)
Hemoglobin: 10 g/dL — ABNORMAL LOW (ref 12.0–15.0)

## 2022-02-18 MED ORDER — MAGNESIUM SULFATE 4 GM/100ML IV SOLN
4.0000 g | Freq: Once | INTRAVENOUS | Status: AC
  Administered 2022-02-18: 4 g via INTRAVENOUS
  Filled 2022-02-18: qty 100

## 2022-02-18 MED ORDER — POTASSIUM CHLORIDE CRYS ER 20 MEQ PO TBCR
40.0000 meq | EXTENDED_RELEASE_TABLET | Freq: Once | ORAL | Status: AC
  Administered 2022-02-18: 40 meq via ORAL
  Filled 2022-02-18: qty 2

## 2022-02-18 MED ORDER — PNEUMOCOCCAL 20-VAL CONJ VACC 0.5 ML IM SUSY
0.5000 mL | PREFILLED_SYRINGE | INTRAMUSCULAR | Status: AC
  Administered 2022-02-20: 0.5 mL via INTRAMUSCULAR
  Filled 2022-02-18: qty 0.5

## 2022-02-18 MED ORDER — INFLUENZA VAC A&B SA ADJ QUAD 0.5 ML IM PRSY
0.5000 mL | PREFILLED_SYRINGE | INTRAMUSCULAR | Status: AC
  Administered 2022-02-20: 0.5 mL via INTRAMUSCULAR
  Filled 2022-02-18: qty 0.5

## 2022-02-18 MED ORDER — SODIUM CHLORIDE 0.9% IV SOLUTION: Freq: Once | INTRAVENOUS | Status: AC

## 2022-02-18 NOTE — Progress Notes (Signed)
[facility] PROGRESS NOTE   Sue Chase TJQ:[id number] DOB: [birth year] DOA: 02/17/2022  PCP: [name], NP  Brief History/Interval Summary: 66 y.o. female with medical history significant for stage IV right lung adenocarcinoma on active chemotherapy, COPD, PE/DVT on Eliquis, anemia, ascending aortic dilatation who presented to the ED for evaluation of dyspnea.  CT angiogram did not show pulm embolism.  No clear pneumonia noted either.  Patient was noted to have COPD exacerbation.  She was hospitalized for further management.  Consultants: Oncology will be notified of patient's hospitalization  Procedures: None    Subjective/Interval History: Patient continues to have shortness of breath.  Cough which is dry.  Denies any chest pain.  No nausea vomiting.  Feels fatigued.     Assessment/Plan:  COPD with acute exacerbation/acute respiratory failure with hypoxia She had saturations of 87% on room air.  Currently on oxygen at 2 L/min. No PE noted on CT scan. Patient started on steroids nebulizer treatments.  Still has wheezing especially in the left lung. Continue azithromycin. She has almost a 45-pack-year history of smoking but stopped smoking recently.  History of adenocarcinoma right lung, stage IV Followed by Dr. Julien Nordmann.  On active chemotherapy with carboplatin, Alimta and Keytruda.  Last dose on December 26. CTA chest shows slight increased size of spiculated right upper lobe lesion, and increased number of bilateral groundglass nodular lesion suspected to be intraorgan metastases. Medical oncology will be notified.  Anemia likely due to chemotherapy Drop in hemoglobin noted.  Hemoglobin 7.1.  Could be due to chemotherapy.  No evidence of active bleeding.  She is fatigued.  Seems to have symptomatic anemia.  Will initiate blood transfusion.  Will give her 2 units.  Recheck labs tomorrow.  Hypokalemia/hypomagnesemia Will be repleted.  History of  pulmonary embolism Continue Eliquis.  DVT Prophylaxis: On Eliquis Code Status: Full code Family Communication: Discussed with patient Disposition Plan: Hopefully return home when improved  Status is: Inpatient Remains inpatient appropriate because: COPD exacerbation      Medications: Scheduled:  apixaban  5 mg Oral BID   azithromycin  500 mg Oral Daily   guaiFENesin  600 mg Oral BID   ipratropium-albuterol  3 mL Nebulization Q6H   magnesium oxide  400 mg Oral Daily   methylPREDNISolone (SOLU-MEDROL) injection  40 mg Intravenous Q12H   mometasone-formoterol  2 puff Inhalation BID   potassium chloride  40 mEq Oral Once   umeclidinium bromide  1 puff Inhalation Daily   Continuous:  magnesium sulfate bolus IVPB 4 g (02/18/22 1121)   TMA:[id number] **OR** acetaminophen, albuterol, ondansetron **OR** ondansetron (ZOFRAN) IV, senna-docusate  Antibiotics: Anti-infectives (From admission, onward)    Start     Dose/Rate Route Frequency Ordered Stop   02/18/22 1000  azithromycin (ZITHROMAX) tablet 500 mg       See Hyperspace for full Linked Orders Report.   500 mg Oral Daily 02/17/22 2350 02/22/22 0959   02/18/22 0000  azithromycin (ZITHROMAX) 500 mg in sodium chloride 0.9 % 250 mL IVPB       See Hyperspace for full Linked Orders Report.   500 mg 250 mL/hr over 60 Minutes Intravenous Every 24 hours 02/17/22 2350 02/18/22 0145   02/17/22 2015  cefTRIAXone (ROCEPHIN) 2 g in sodium chloride 0.9 % 100 mL IVPB        2 g 200 mL/hr over 30 Minutes Intravenous  Once 02/17/22 2004 02/17/22 2045       Objective:  Vital Signs  Vitals:  02/18/22 0156 02/18/22 0542 02/18/22 0911 02/18/22 1048  BP: 105/70 (!) 103/58  99/64  Pulse: 91 94  91  Resp: 18 18  20   Temp: 98 F (36.7 C) 98.2 F (36.8 C)  98.4 F (36.9 C)  TempSrc: Oral Oral  Oral  SpO2: 99% 100% 98% 99%    Intake/Output Summary (Last 24 hours) at 02/18/2022 1159 Last data filed at 02/18/2022 0431 Gross per 24  hour  Intake 263.33 ml  Output --  Net 263.33 ml   There were no vitals filed for this visit.  General appearance: Awake alert.  In no distress Resp: Diminished air entry at the bases.  Wheezing in the left lung.  No definite crackles.  No rhonchi. Cardio: S1-S2 is normal regular.  No S3-S4.  No rubs murmurs or bruit GI: Abdomen is soft.  Nontender nondistended.  Bowel sounds are present normal.  No masses organomegaly Extremities: No edema.  Full range of motion of lower extremities. Neurologic: Alert and oriented x3.  No focal neurological deficits.    Lab Results:  Data Reviewed: I have personally reviewed following labs and reports of the imaging studies  CBC: Recent Labs  Lab 02/15/22 1115 02/17/22 1935 02/18/22 0712  WBC 8.3 13.7* 7.8  NEUTROABS 3.6 8.5*  --   HGB 8.2* 9.0* 7.1*  HCT 23.9* 27.5* 22.0*  MCV 95.6 98.9 101.9*  PLT 314 370 161    Basic Metabolic Panel: Recent Labs  Lab 02/15/22 1115 02/17/22 1935 02/18/22 0712  NA 139 138 135  K 2.9* 3.1* 3.2*  CL 103 106 106  CO2 28 24 22   GLUCOSE 103* 116* 167*  BUN 11 14 15   CREATININE 1.05* 1.04* 1.04*  CALCIUM 9.0 9.3 8.9  MG  --   --  1.3*    GFR: Estimated Creatinine Clearance: 37.5 mL/min (A) (by C-G formula based on SCr of 1.04 mg/dL (H)).  Liver Function Tests: Recent Labs  Lab 02/15/22 1115 02/17/22 1935  AST 18 23  ALT 7 11  ALKPHOS 77 79  BILITOT 0.3 0.5  PROT 7.2 7.6  ALBUMIN 3.7 3.7     Recent Results (from the past 240 hour(s))  Resp panel by RT-PCR (RSV, Flu A&B, Covid) Anterior Nasal Swab     Status: None   Collection Time: 02/17/22  7:12 PM   Specimen: Anterior Nasal Swab  Result Value Ref Range Status   SARS Coronavirus 2 by RT PCR NEGATIVE NEGATIVE Final    Comment: (NOTE) SARS-CoV-2 target nucleic acids are NOT DETECTED.  The SARS-CoV-2 RNA is generally detectable in upper respiratory specimens during the acute phase of infection. The lowest concentration of  SARS-CoV-2 viral copies this assay can detect is 138 copies/mL. A negative result does not preclude SARS-Cov-2 infection and should not be used as the sole basis for treatment or other patient management decisions. A negative result may occur with  improper specimen collection/handling, submission of specimen other than nasopharyngeal swab, presence of viral mutation(s) within the areas targeted by this assay, and inadequate number of viral copies(<138 copies/mL). A negative result must be combined with clinical observations, patient history, and epidemiological information. The expected result is Negative.  Fact Sheet for Patients:  EntrepreneurPulse.com.au  Fact Sheet for Healthcare Providers:  IncredibleEmployment.be  This test is no t yet approved or cleared by the [place] FDA and  has been authorized for detection and/or diagnosis of SARS-CoV-2 by FDA under an Emergency Use Authorization (EUA). This EUA will remain  in  effect (meaning this test can be used) for the duration of the COVID-19 declaration under Section 564(b)(1) of the Act, 21 U.S.C.section 360bbb-3(b)(1), unless the authorization is terminated  or revoked sooner.       Influenza A by PCR NEGATIVE NEGATIVE Final   Influenza B by PCR NEGATIVE NEGATIVE Final    Comment: (NOTE) The Xpert Xpress SARS-CoV-2/FLU/RSV plus assay is intended as an aid in the diagnosis of influenza from Nasopharyngeal swab specimens and should not be used as a sole basis for treatment. Nasal washings and aspirates are unacceptable for Xpert Xpress SARS-CoV-2/FLU/RSV testing.  Fact Sheet for Patients: EntrepreneurPulse.com.au  Fact Sheet for Healthcare Providers: IncredibleEmployment.be  This test is not yet approved or cleared by the [place] FDA and has been authorized for detection and/or diagnosis of SARS-CoV-2 by FDA under an Emergency Use  Authorization (EUA). This EUA will remain in effect (meaning this test can be used) for the duration of the COVID-19 declaration under Section 564(b)(1) of the Act, 21 U.S.C. section 360bbb-3(b)(1), unless the authorization is terminated or revoked.     Resp Syncytial Virus by PCR NEGATIVE NEGATIVE Final    Comment: (NOTE) Fact Sheet for Patients: EntrepreneurPulse.com.au  Fact Sheet for Healthcare Providers: IncredibleEmployment.be  This test is not yet approved or cleared by the [place] FDA and has been authorized for detection and/or diagnosis of SARS-CoV-2 by FDA under an Emergency Use Authorization (EUA). This EUA will remain in effect (meaning this test can be used) for the duration of the COVID-19 declaration under Section 564(b)(1) of the Act, 21 U.S.C. section 360bbb-3(b)(1), unless the authorization is terminated or revoked.  Performed at [facility], [place] [address] [place], [place] [address]       Radiology Studies: CT Angio Chest PE W and/or Wo Contrast  Result Date: 02/17/2022 CLINICAL DATA:  Cancer patient with worsening shortness of breath. Prior history of pulmonary embolism. Weakness and fatigue onset today. EXAM: CT ANGIOGRAPHY CHEST WITH CONTRAST TECHNIQUE: Multidetector CT imaging of the chest was performed using the standard protocol during bolus administration of intravenous contrast. Multiplanar CT image reconstructions and MIPs were obtained to evaluate the vascular anatomy. RADIATION DOSE REDUCTION: This exam was performed according to the departmental dose-optimization program which includes automated exposure control, adjustment of the mA and/or kV according to patient size and/or use of iterative reconstruction technique. CONTRAST:  55mL OMNIPAQUE IOHEXOL 350 MG/ML SOLN COMPARISON:  PET-CT 06/25/2021, CT chest with contrast more recently 12/30/2021. FINDINGS: Cardiovascular: Right IJ port  catheter terminates in the distal SVC. The cardiac size is normal. Scattered single-vessel coronary artery calcification noted in the LAD. There is again a small pericardial effusion anteriorly. Pulmonary arteries and veins are normal caliber no arterial embolism is seen. There is aortic atherosclerosis, primarily in the arch where there is mixed soft and calcific plaque again noted with increasingly ulcerative soft plaque along the anterior inferior wall of the arch. There is scattered calcification in the great vessels. There is no aortic or great vessel dissection or stenosis. Slight dilatation again noted in the ascending aorta to 4 cm, stable. Remainder within normal caliber limits. Mediastinum/Nodes: Borderline prominent hilar lymph nodes are again noted up to 9 mm in short axis with a few slightly prominent right paratracheal nodes up to 1 cm in short axis. No other adenopathy is seen. No abnormality is seen thoracic trachea, thoracic esophagus and thyroid. Lungs/Pleura: Mixed attenuation semisolid lesion abutting the fissure in the posterior segment of the right upper lobe  is stable to slightly increased in size, current measurements of 3.4 x 2.1 cm on 6:46, at a similar level previously having measured 3.2 x 2 cm. Associated architectural distortion is similar, with the adjacent fissure invaginated towards the lesion as before. Spiculations surrounding this lesion appear similar. There are innumerable bilateral nodular ground-glass lesions suspected to be intraorgan metastases. There is a more solid nodule abutting the fissure in the anterior basal segment of the right upper lobe, on 6:105 today measuring 7.3 mm, previously 6.3 mm. Additional areas of ground-glass subsolid disease include a lesion measuring 2 cm in the right lower lobe on 6:74, unchanged, 1.4 cm right lower lobe ground-glass lesion anteriorly on 6:81 also unchanged, and a left lower lobe 2 cm ground-glass lesion on 6:109 which is also  unchanged. Innumerable much smaller ground-glass lesions throughout both lungs appear to have increased in number. Diffuse bronchial thickening is noted and mild emphysematous disease with additional scar-like opacities in the bases. There is no pleural effusion.  No airspace consolidation is seen. Upper Abdomen: No acute findings. Musculoskeletal: Mild-to-moderate superior endplate anterior wedge compression fracture of T9 is unchanged. There is osteopenia and mild degenerative change of the spine. No destructive bone lesion is seen. There is progressive thinning of the body wall fat, mild body wall anasarca, findings concerning for cachexia and malnutrition. Review of the MIP images confirms the above findings. IMPRESSION: 1. No evidence of arterial embolus. 2. Aortic atherosclerosis with increasingly ulcerative soft plaque in the arch. 3. Stable 4 cm ascending aortic dilatation. 4. Stable borderline prominent hilar and mediastinal lymph nodes. 5. Stable to slightly increased size of mixed attenuation semisolid spiculated lesion in the posterior segment of the right upper lobe. 6. Innumerable bilateral ground-glass nodular lesions suspected to be intraorgan metastases, and appear to have increased in number. 7. Diffuse bronchial thickening and mild emphysema. 8. Progressive thinning of the body wall fat, mild body wall anasarca, findings concerning for cachexia and malnutrition. 9. Stable T9 superior endplate anterior wedge compression fracture. Aortic Atherosclerosis (ICD10-I70.0) and Emphysema (ICD10-J43.9). Electronically Signed   By: Telford Nab M.D.   On: 02/17/2022 22:02   DG Chest 2 View  Result Date: 02/17/2022 CLINICAL DATA:  Lung cancer, dyspnea EXAM: CHEST - 2 VIEW COMPARISON:  06/08/2021 FINDINGS: Limb defined left upper lobe opacity has decreased in size since prior examination possibly related to interval response to therapy. The lungs are symmetrically hyperinflated. No new focal pulmonary  nodules or infiltrates. No pneumothorax or pleural effusion. Cardiac size within normal limits. Right internal jugular chest port tip noted within the superior vena cava. Pulmonary vascularity is normal. No acute bone abnormality. IMPRESSION: 1. No radiographic evidence of acute cardiopulmonary disease. 2. COPD 3. Interval decrease in size of left upper lobe opacity, possibly related to interval response to therapy. This could be better assessed with repeat CT imaging if indicated. Electronically Signed   By: Fidela Salisbury M.D.   On: 02/17/2022 19:52       LOS: 0 days   [facility] Pager on [email or web address]  02/18/2022, 11:59 AM

## 2022-02-18 NOTE — TOC Initial Note (Signed)
Transition of Care [facility]) - Initial/Assessment Note    Patient Details  Name: Sue Chase MRN: [id number] Date of Birth: 1955-04-29  [facility]) CM/SW Contact:    Dessa Phi, RN Phone Number: 02/18/2022, 2:35 PM  Clinical Narrative: From home alone. Monitor on 02-can arrange if needed.                  Expected Discharge Plan: Home/Self Care Barriers to Discharge: Continued Medical Work up   Patient Goals and CMS Choice Patient states their goals for this hospitalization and ongoing recovery are::  (Home) CMS Medicare.gov Compare Post Acute Care list provided to:: Patient Represenative (must comment) (Laura(dtr)) Choice offered to / list presented to : Adult Children      Expected Discharge Plan and Services   Discharge Planning Services: CM Consult   Living arrangements for the past 2 months: Single Family Home                                      Prior Living Arrangements/Services Living arrangements for the past 2 months: Single Family Home Lives with:: Self Patient language and need for interpreter reviewed:: Yes Do you feel safe going back to the place where you live?: Yes      Need for Family Participation in Patient Care: Yes (Comment) Care giver support system in place?: Yes (comment) Current home services: DME (rw) Criminal Activity/Legal Involvement Pertinent to Current Situation/Hospitalization: No - Comment as needed  Activities of Daily Living      Permission Sought/Granted Permission sought to share information with : Case Manager Permission granted to share information with : Yes, Verbal Permission Granted  Share Information with NAME:  (Case manager)           Emotional Assessment Appearance:: Appears stated age Attitude/Demeanor/Rapport: Gracious Affect (typically observed): Accepting Orientation: : Oriented to Self, Oriented to Place, Oriented to  Time, Oriented to Situation Alcohol / Substance Use: Not  Applicable Psych Involvement: No (comment)  Admission diagnosis:  Hypokalemia [E87.6] SOB (shortness of breath) [R06.02] Hypoxia [R09.02] COPD exacerbation (HCC) [J44.1] Acute respiratory failure with hypoxia (HCC) [J96.01] COPD (chronic obstructive pulmonary disease) (HCC) [J44.9] Patient Active Problem List   Diagnosis Date Noted   COPD (chronic obstructive pulmonary disease) ([place]) 02/18/2022   Acute respiratory failure with hypoxia ([place]) 02/17/2022   COPD with acute exacerbation ([place]) 02/17/2022   History of pulmonary embolism 02/17/2022   Biliary obstruction 11/03/2021   Choledocholithiasis 11/03/2021   DVT (deep venous thrombosis) ([place]) 11/03/2021   GERD (gastroesophageal reflux disease) 11/03/2021   Hypokalemia 11/03/2021   Normocytic anemia 11/03/2021   Port-A-Cath in place 08/17/2021   Encounter for antineoplastic immunotherapy 08/03/2021   Adenocarcinoma of right lung, stage 4 ([place]) 07/06/2021   Encounter for antineoplastic chemotherapy 07/06/2021   Lung nodule, multiple 05/17/2021   Acute pulmonary embolism ([place]) 02/19/2021   Lower extremity edema 06/29/2017   Hyperlipidemia 06/29/2017   Tobacco use 06/29/2017   PCP:  [name], NP Pharmacy:   [facility] [id number], [place] [address] Phone: [phone] Fax: [phone]     Social Determinants of Health (SDOH) Social History: SDOH Screenings   Food Insecurity: No Food Insecurity (11/03/2021)  Housing: Low Risk  (11/03/2021)  Transportation Needs: No Transportation Needs (11/03/2021)  Utilities: Not At Risk (11/03/2021)  Depression (PHQ2-9): Low Risk  (11/11/2021)  Financial  Resource Strain: Medium Risk (10/18/2021)  Physical Activity: Unknown (10/18/2021)  Social Connections: Unknown (10/18/2021)  Stress: No Stress Concern Present (10/18/2021)  Tobacco Use: High Risk (02/17/2022)   SDOH Interventions:     Readmission Risk  Interventions     No data to display

## 2022-02-18 NOTE — Progress Notes (Signed)
Mobility Specialist - Progress Note   02/18/22 1004  Oxygen Therapy  O2 Device Nasal Cannula  O2 Flow Rate (L/min) 3 L/min  Mobility  Activity Ambulated with assistance in hallway  Level of Assistance Modified independent, requires aide device or extra time  Assistive Device Other (Comment)  Distance Ambulated (ft) 120 ft  Activity Response Tolerated well  Mobility Referral Yes  $Mobility charge 1 Mobility   Pt received in bed and agreeable to mobility. Pt requested assistance to bathroom prior to ambulating. Pt took 3x standing rest breaks in hallway & encouraged pursed lip breathing for each break. Pt very SOB throughout ambulation. Pt to bed after session with all needs met.    Pre-mobility: 99 HR, 99% SpO2 (3L [place]) During mobility: 102 HR, 98% SpO2 (3L [place]) Post-mobility: 98 HR, 98%SPO2 (3L [place])  Set designer

## 2022-02-19 DIAGNOSIS — J441 Chronic obstructive pulmonary disease with (acute) exacerbation: Secondary | ICD-10-CM | POA: Diagnosis not present

## 2022-02-19 DIAGNOSIS — J9601 Acute respiratory failure with hypoxia: Secondary | ICD-10-CM | POA: Diagnosis not present

## 2022-02-19 LAB — BPAM RBC
Blood Product Expiration Date: 202401202359
Blood Product Expiration Date: 202401232359
ISSUE DATE / TIME: 202312291239
ISSUE DATE / TIME: 202312291615
Unit Type and Rh: 6200
Unit Type and Rh: 6200

## 2022-02-19 LAB — TYPE AND SCREEN
ABO/RH(D): A POS
Antibody Screen: NEGATIVE
Unit division: 0
Unit division: 0

## 2022-02-19 LAB — CBC
HCT: 31.4 % — ABNORMAL LOW (ref 36.0–46.0)
Hemoglobin: 10.5 g/dL — ABNORMAL LOW (ref 12.0–15.0)
MCH: 32 pg (ref 26.0–34.0)
MCHC: 33.4 g/dL (ref 30.0–36.0)
MCV: 95.7 fL (ref 80.0–100.0)
Platelets: 255 10*3/uL (ref 150–400)
RBC: 3.28 MIL/uL — ABNORMAL LOW (ref 3.87–5.11)
RDW: 17 % — ABNORMAL HIGH (ref 11.5–15.5)
WBC: 13.7 10*3/uL — ABNORMAL HIGH (ref 4.0–10.5)
nRBC: 0 % (ref 0.0–0.2)

## 2022-02-19 LAB — MAGNESIUM: Magnesium: 2.6 mg/dL — ABNORMAL HIGH (ref 1.7–2.4)

## 2022-02-19 LAB — BASIC METABOLIC PANEL
Anion gap: 10 (ref 5–15)
BUN: 22 mg/dL (ref 8–23)
CO2: 21 mmol/L — ABNORMAL LOW (ref 22–32)
Calcium: 9.1 mg/dL (ref 8.9–10.3)
Chloride: 107 mmol/L (ref 98–111)
Creatinine, Ser: 1.08 mg/dL — ABNORMAL HIGH (ref 0.44–1.00)
GFR, Estimated: 57 mL/min — ABNORMAL LOW (ref >60)
Glucose, Bld: 137 mg/dL — ABNORMAL HIGH (ref 70–99)
Potassium: 3.6 mmol/L (ref 3.5–5.1)
Sodium: 138 mmol/L (ref 135–145)

## 2022-02-19 MED ORDER — METHYLPREDNISOLONE SODIUM SUCC 125 MG IJ SOLR
80.0000 mg | Freq: Two times a day (BID) | INTRAMUSCULAR | Status: DC
  Administered 2022-02-19 – 2022-02-21 (×4): 80 mg via INTRAVENOUS
  Filled 2022-02-19 (×4): qty 2

## 2022-02-19 NOTE — Progress Notes (Signed)
Mobility Specialist - Progress Note   02/19/22 1408  Mobility  Activity Ambulated with assistance to bathroom  Level of Assistance Independent  Assistive Device None  Distance Ambulated (ft) 50 ft  Activity Response Tolerated well  Mobility Referral Yes  $Mobility charge 1 Mobility   Pt received in recliner and agreeable to mobility. Realized pt was soiled once stepping out in hallway, pt then became very teart eyed & embarrassed. Explained to pt that it happens & it's okay. Nurse notified of occurrence.  With the assistance of NT we were able to assist in cleaning pt. Pt very fatigued after cleaning up & wanted to lay back down. Pt to bed after session with all needs met.      [facility]

## 2022-02-19 NOTE — Progress Notes (Signed)
Mobility Specialist - Progress Note   02/19/22 0955  Oxygen Therapy  O2 Device Nasal Cannula  O2 Flow Rate (L/min) 3 L/min  Mobility  Activity Ambulated independently in hallway  Level of Assistance Independent  Assistive Device None  Distance Ambulated (ft) 200 ft  Activity Response Tolerated well  Mobility Referral Yes  $Mobility charge 1 Mobility   Pt received in bed and agreeable to mobility. Pt took 3x standing pursed lip breathing breaks throughout session. No complaints during session. Pt to EOB after session with all needs met.    Pre-mobility: 86 HR,  97% SpO2 (3L [place]) During mobility: 99 HR, 91% SpO2 (3L [place]) Post-mobility: 84 HR, 96% SPO2 (3L [place])  Set designer

## 2022-02-19 NOTE — Progress Notes (Signed)
Patient offered influenza and pneumococcal vaccine. She was unsure about taking it do to actively taking chemotherapy at this time. Patient will address concern with MD tomorrow.

## 2022-02-19 NOTE — Progress Notes (Signed)
PROGRESS NOTE    Sue Chase  KKX:[id number] DOB: 11/27/1955 DOA: 02/17/2022 PCP: [name], NP   Brief Narrative:   66 y.o. female with medical history significant for stage IV right lung adenocarcinoma on active chemotherapy, COPD, PE/DVT on Eliquis, anemia, ascending aortic dilatation who presented to the ED for evaluation of dyspnea.  CT angiogram did not show pulm embolism.  No clear pneumonia noted either.  Patient was noted to have COPD exacerbation.   She was started on IV steroids.  Assessment & Plan:   COPD exacerbation Acute respiratory failure with hypoxia History of tobacco abuse -Currently on 3 L oxygen via nasal cannula.  Wean off as able. -Increase Solu-Medrol to 80 mg IV every 12 hours.  Continue DuoNebs and inhaled Dulera.  Currently also on oral Zithromax. -Might need outpatient pulmonary evaluation and follow-up. -Apparently recently stopped smoking after 45-pack-year history of smoking.  Stage IV adenocarcinoma of right lung -Followed by Dr. Julien Nordmann.  On active chemotherapy with carboplatin, Alimta and Keytruda.  Last dose on December 26. -CTA chest shows slight increased size of spiculated right upper lobe lesion, and increased number of bilateral groundglass nodular lesion suspected to be intraorgan metastases. -Outpatient follow-up with Dr. Julien Nordmann.  He has been added to the care teams on epic  Anemia of chronic disease due to chemotherapy -Hemoglobin 7.1 on 02/18/2022.  Status post 2 units packed red cells transfusion.  Repeat hemoglobin was 10.  Labs pending for today.  Monitor intermittently  Hypokalemia/hypomagnesemia -Labs pending for today.  History of pulmonary embolism Continue Eliquis  DVT Prophylaxis: On Eliquis Code Status: Full code Family Communication: Discussed with patient Disposition Plan: Hopefully return home when improved   Status is: Inpatient Remains inpatient appropriate because: COPD exacerbation   Antimicrobials:  Zithromax   Subjective: Patient seen and examined at bedside.  Still complains of intermittent cough with shortness of breath.  Does not feel ready to go home today.  No fever, chest pain, vomiting reported.  Objective: Vitals:   02/18/22 1615 02/18/22 1644 02/18/22 1915 02/19/22 0440  BP: 108/72 111/66 115/70 122/66  Pulse: 77 83 78 83  Resp: 18 20 20 18   Temp: 98 F (36.7 C) 97.6 F (36.4 C)  97.8 F (36.6 C)  TempSrc: Oral Oral Oral Oral  SpO2: 100% 100% 100% 99%    Intake/Output Summary (Last 24 hours) at 02/19/2022 0751 Last data filed at 02/18/2022 1915 Gross per 24 hour  Intake 666 ml  Output --  Net 666 ml   There were no vitals filed for this visit.  Examination:  General exam: Appears calm and comfortable.  Looks chronically ill and deconditioned.  Currently on 3 L oxygen by nasal cannula Respiratory system: Bilateral decreased breath sounds at bases with scattered wheezing Cardiovascular system: S1 & S2 heard, Rate controlled Gastrointestinal system: Abdomen is nondistended, soft and nontender. Normal bowel sounds heard. Extremities: No cyanosis, clubbing, edema  Central nervous system: Alert and oriented. No focal neurological deficits. Moving extremities Skin: No rashes, lesions or ulcers Psychiatry: Flat affect.  Not agitated.   Data Reviewed: I have personally reviewed following labs and imaging studies  CBC: Recent Labs  Lab 02/15/22 1115 02/17/22 1935 02/18/22 0712 02/18/22 2215  WBC 8.3 13.7* 7.8  --   NEUTROABS 3.6 8.5*  --   --   HGB 8.2* 9.0* 7.1* 10.0*  HCT 23.9* 27.5* 22.0* 29.7*  MCV 95.6 98.9 101.9*  --   PLT 314 370 227  --  Basic Metabolic Panel: Recent Labs  Lab 02/15/22 1115 02/17/22 1935 02/18/22 0712  NA 139 138 135  K 2.9* 3.1* 3.2*  CL 103 106 106  CO2 28 24 22   GLUCOSE 103* 116* 167*  BUN 11 14 15   CREATININE 1.05* 1.04* 1.04*  CALCIUM 9.0 9.3 8.9  MG  --   --  1.3*   GFR: Estimated Creatinine Clearance:  37.5 mL/min (A) (by C-G formula based on SCr of 1.04 mg/dL (H)). Liver Function Tests: Recent Labs  Lab 02/15/22 1115 02/17/22 1935  AST 18 23  ALT 7 11  ALKPHOS 77 79  BILITOT 0.3 0.5  PROT 7.2 7.6  ALBUMIN 3.7 3.7   No results for input(s): "LIPASE", "AMYLASE" in the last 168 hours. No results for input(s): "AMMONIA" in the last 168 hours. Coagulation Profile: No results for input(s): "INR", "PROTIME" in the last 168 hours. Cardiac Enzymes: No results for input(s): "CKTOTAL", "CKMB", "CKMBINDEX", "TROPONINI" in the last 168 hours. BNP (last 3 results) No results for input(s): "PROBNP" in the last 8760 hours. HbA1C: No results for input(s): "HGBA1C" in the last 72 hours. CBG: No results for input(s): "GLUCAP" in the last 168 hours. Lipid Profile: No results for input(s): "CHOL", "HDL", "LDLCALC", "TRIG", "CHOLHDL", "LDLDIRECT" in the last 72 hours. Thyroid Function Tests: No results for input(s): "TSH", "T4TOTAL", "FREET4", "T3FREE", "THYROIDAB" in the last 72 hours. Anemia Panel: No results for input(s): "VITAMINB12", "FOLATE", "FERRITIN", "TIBC", "IRON", "RETICCTPCT" in the last 72 hours. Sepsis Labs: No results for input(s): "PROCALCITON", "LATICACIDVEN" in the last 168 hours.  Recent Results (from the past 240 hour(s))  Resp panel by RT-PCR (RSV, Flu A&B, Covid) Anterior Nasal Swab     Status: None   Collection Time: 02/17/22  7:12 PM   Specimen: Anterior Nasal Swab  Result Value Ref Range Status   SARS Coronavirus 2 by RT PCR NEGATIVE NEGATIVE Final    Comment: (NOTE) SARS-CoV-2 target nucleic acids are NOT DETECTED.  The SARS-CoV-2 RNA is generally detectable in upper respiratory specimens during the acute phase of infection. The lowest concentration of SARS-CoV-2 viral copies this assay can detect is 138 copies/mL. A negative result does not preclude SARS-Cov-2 infection and should not be used as the sole basis for treatment or other patient management  decisions. A negative result may occur with  improper specimen collection/handling, submission of specimen other than nasopharyngeal swab, presence of viral mutation(s) within the areas targeted by this assay, and inadequate number of viral copies(<138 copies/mL). A negative result must be combined with clinical observations, patient history, and epidemiological information. The expected result is Negative.  Fact Sheet for Patients:  EntrepreneurPulse.com.au  Fact Sheet for Healthcare Providers:  IncredibleEmployment.be  This test is no t yet approved or cleared by the [place] FDA and  has been authorized for detection and/or diagnosis of SARS-CoV-2 by FDA under an Emergency Use Authorization (EUA). This EUA will remain  in effect (meaning this test can be used) for the duration of the COVID-19 declaration under Section 564(b)(1) of the Act, 21 U.S.C.section 360bbb-3(b)(1), unless the authorization is terminated  or revoked sooner.       Influenza A by PCR NEGATIVE NEGATIVE Final   Influenza B by PCR NEGATIVE NEGATIVE Final    Comment: (NOTE) The Xpert Xpress SARS-CoV-2/FLU/RSV plus assay is intended as an aid in the diagnosis of influenza from Nasopharyngeal swab specimens and should not be used as a sole basis for treatment. Nasal washings and aspirates are unacceptable for Xpert  Xpress SARS-CoV-2/FLU/RSV testing.  Fact Sheet for Patients: EntrepreneurPulse.com.au  Fact Sheet for Healthcare Providers: IncredibleEmployment.be  This test is not yet approved or cleared by the [place] FDA and has been authorized for detection and/or diagnosis of SARS-CoV-2 by FDA under an Emergency Use Authorization (EUA). This EUA will remain in effect (meaning this test can be used) for the duration of the COVID-19 declaration under Section 564(b)(1) of the Act, 21 U.S.C. section 360bbb-3(b)(1), unless the  authorization is terminated or revoked.     Resp Syncytial Virus by PCR NEGATIVE NEGATIVE Final    Comment: (NOTE) Fact Sheet for Patients: EntrepreneurPulse.com.au  Fact Sheet for Healthcare Providers: IncredibleEmployment.be  This test is not yet approved or cleared by the [place] FDA and has been authorized for detection and/or diagnosis of SARS-CoV-2 by FDA under an Emergency Use Authorization (EUA). This EUA will remain in effect (meaning this test can be used) for the duration of the COVID-19 declaration under Section 564(b)(1) of the Act, 21 U.S.C. section 360bbb-3(b)(1), unless the authorization is terminated or revoked.  Performed at [facility] [place] [address], [place], [place] [address]          Radiology Studies: CT Angio Chest PE W and/or Wo Contrast  Result Date: 02/17/2022 CLINICAL DATA:  Cancer patient with worsening shortness of breath. Prior history of pulmonary embolism. Weakness and fatigue onset today. EXAM: CT ANGIOGRAPHY CHEST WITH CONTRAST TECHNIQUE: Multidetector CT imaging of the chest was performed using the standard protocol during bolus administration of intravenous contrast. Multiplanar CT image reconstructions and MIPs were obtained to evaluate the vascular anatomy. RADIATION DOSE REDUCTION: This exam was performed according to the departmental dose-optimization program which includes automated exposure control, adjustment of the mA and/or kV according to patient size and/or use of iterative reconstruction technique. CONTRAST:  20mL OMNIPAQUE IOHEXOL 350 MG/ML SOLN COMPARISON:  PET-CT 06/25/2021, CT chest with contrast more recently 12/30/2021. FINDINGS: Cardiovascular: Right IJ port catheter terminates in the distal SVC. The cardiac size is normal. Scattered single-vessel coronary artery calcification noted in the LAD. There is again a small pericardial effusion anteriorly. Pulmonary arteries  and veins are normal caliber no arterial embolism is seen. There is aortic atherosclerosis, primarily in the arch where there is mixed soft and calcific plaque again noted with increasingly ulcerative soft plaque along the anterior inferior wall of the arch. There is scattered calcification in the great vessels. There is no aortic or great vessel dissection or stenosis. Slight dilatation again noted in the ascending aorta to 4 cm, stable. Remainder within normal caliber limits. Mediastinum/Nodes: Borderline prominent hilar lymph nodes are again noted up to 9 mm in short axis with a few slightly prominent right paratracheal nodes up to 1 cm in short axis. No other adenopathy is seen. No abnormality is seen thoracic trachea, thoracic esophagus and thyroid. Lungs/Pleura: Mixed attenuation semisolid lesion abutting the fissure in the posterior segment of the right upper lobe is stable to slightly increased in size, current measurements of 3.4 x 2.1 cm on 6:46, at a similar level previously having measured 3.2 x 2 cm. Associated architectural distortion is similar, with the adjacent fissure invaginated towards the lesion as before. Spiculations surrounding this lesion appear similar. There are innumerable bilateral nodular ground-glass lesions suspected to be intraorgan metastases. There is a more solid nodule abutting the fissure in the anterior basal segment of the right upper lobe, on 6:105 today measuring 7.3 mm, previously 6.3 mm. Additional areas of ground-glass subsolid disease include  a lesion measuring 2 cm in the right lower lobe on 6:74, unchanged, 1.4 cm right lower lobe ground-glass lesion anteriorly on 6:81 also unchanged, and a left lower lobe 2 cm ground-glass lesion on 6:109 which is also unchanged. Innumerable much smaller ground-glass lesions throughout both lungs appear to have increased in number. Diffuse bronchial thickening is noted and mild emphysematous disease with additional scar-like opacities  in the bases. There is no pleural effusion.  No airspace consolidation is seen. Upper Abdomen: No acute findings. Musculoskeletal: Mild-to-moderate superior endplate anterior wedge compression fracture of T9 is unchanged. There is osteopenia and mild degenerative change of the spine. No destructive bone lesion is seen. There is progressive thinning of the body wall fat, mild body wall anasarca, findings concerning for cachexia and malnutrition. Review of the MIP images confirms the above findings. IMPRESSION: 1. No evidence of arterial embolus. 2. Aortic atherosclerosis with increasingly ulcerative soft plaque in the arch. 3. Stable 4 cm ascending aortic dilatation. 4. Stable borderline prominent hilar and mediastinal lymph nodes. 5. Stable to slightly increased size of mixed attenuation semisolid spiculated lesion in the posterior segment of the right upper lobe. 6. Innumerable bilateral ground-glass nodular lesions suspected to be intraorgan metastases, and appear to have increased in number. 7. Diffuse bronchial thickening and mild emphysema. 8. Progressive thinning of the body wall fat, mild body wall anasarca, findings concerning for cachexia and malnutrition. 9. Stable T9 superior endplate anterior wedge compression fracture. Aortic Atherosclerosis (ICD10-I70.0) and Emphysema (ICD10-J43.9). Electronically Signed   By: Telford Nab M.D.   On: 02/17/2022 22:02   DG Chest 2 View  Result Date: 02/17/2022 CLINICAL DATA:  Lung cancer, dyspnea EXAM: CHEST - 2 VIEW COMPARISON:  06/08/2021 FINDINGS: Limb defined left upper lobe opacity has decreased in size since prior examination possibly related to interval response to therapy. The lungs are symmetrically hyperinflated. No new focal pulmonary nodules or infiltrates. No pneumothorax or pleural effusion. Cardiac size within normal limits. Right internal jugular chest port tip noted within the superior vena cava. Pulmonary vascularity is normal. No acute bone  abnormality. IMPRESSION: 1. No radiographic evidence of acute cardiopulmonary disease. 2. COPD 3. Interval decrease in size of left upper lobe opacity, possibly related to interval response to therapy. This could be better assessed with repeat CT imaging if indicated. Electronically Signed   By: Fidela Salisbury M.D.   On: 02/17/2022 19:52        Scheduled Meds:  apixaban  5 mg Oral BID   azithromycin  500 mg Oral Daily   guaiFENesin  600 mg Oral BID   influenza vaccine adjuvanted  0.5 mL Intramuscular Tomorrow-1000   ipratropium-albuterol  3 mL Nebulization Q6H   magnesium oxide  400 mg Oral Daily   methylPREDNISolone (SOLU-MEDROL) injection  40 mg Intravenous Q12H   mometasone-formoterol  2 puff Inhalation BID   pneumococcal 20-valent conjugate vaccine  0.5 mL Intramuscular Tomorrow-1000   umeclidinium bromide  1 puff Inhalation Daily   Continuous Infusions:        Aline August, MD [facility] 02/19/2022, 7:51 AM  '

## 2022-02-20 DIAGNOSIS — C3491 Malignant neoplasm of unspecified part of right bronchus or lung: Secondary | ICD-10-CM

## 2022-02-20 DIAGNOSIS — J441 Chronic obstructive pulmonary disease with (acute) exacerbation: Secondary | ICD-10-CM | POA: Diagnosis not present

## 2022-02-20 DIAGNOSIS — J9601 Acute respiratory failure with hypoxia: Secondary | ICD-10-CM | POA: Diagnosis not present

## 2022-02-20 LAB — CBC WITH DIFFERENTIAL/PLATELET
Abs Immature Granulocytes: 0 10*3/uL (ref 0.00–0.07)
Basophils Absolute: 0 10*3/uL (ref 0.0–0.1)
Basophils Relative: 0 %
Eosinophils Absolute: 0 10*3/uL (ref 0.0–0.5)
Eosinophils Relative: 0 %
HCT: 33.5 % — ABNORMAL LOW (ref 36.0–46.0)
Hemoglobin: 10.7 g/dL — ABNORMAL LOW (ref 12.0–15.0)
Lymphocytes Relative: 15 %
Lymphs Abs: 1.9 10*3/uL (ref 0.7–4.0)
MCH: 31.8 pg (ref 26.0–34.0)
MCHC: 31.9 g/dL (ref 30.0–36.0)
MCV: 99.4 fL (ref 80.0–100.0)
Monocytes Absolute: 0 10*3/uL — ABNORMAL LOW (ref 0.1–1.0)
Monocytes Relative: 0 %
Neutro Abs: 10.9 10*3/uL — ABNORMAL HIGH (ref 1.7–7.7)
Neutrophils Relative %: 85 %
Platelets: 260 10*3/uL (ref 150–400)
RBC: 3.37 MIL/uL — ABNORMAL LOW (ref 3.87–5.11)
RDW: 17.1 % — ABNORMAL HIGH (ref 11.5–15.5)
WBC: 12.8 10*3/uL — ABNORMAL HIGH (ref 4.0–10.5)
nRBC: 0 % (ref 0.0–0.2)

## 2022-02-20 LAB — BASIC METABOLIC PANEL
Anion gap: 7 (ref 5–15)
BUN: 24 mg/dL — ABNORMAL HIGH (ref 8–23)
CO2: 25 mmol/L (ref 22–32)
Calcium: 9.1 mg/dL (ref 8.9–10.3)
Chloride: 108 mmol/L (ref 98–111)
Creatinine, Ser: 1.08 mg/dL — ABNORMAL HIGH (ref 0.44–1.00)
GFR, Estimated: 57 mL/min — ABNORMAL LOW (ref >60)
Glucose, Bld: 102 mg/dL — ABNORMAL HIGH (ref 70–99)
Potassium: 3.8 mmol/L (ref 3.5–5.1)
Sodium: 140 mmol/L (ref 135–145)

## 2022-02-20 LAB — OCCULT BLOOD X 1 CARD TO LAB, STOOL: Fecal Occult Bld: NEGATIVE

## 2022-02-20 LAB — MAGNESIUM: Magnesium: 2 mg/dL (ref 1.7–2.4)

## 2022-02-20 MED ORDER — PANTOPRAZOLE SODIUM 40 MG PO TBEC
40.0000 mg | DELAYED_RELEASE_TABLET | Freq: Two times a day (BID) | ORAL | Status: DC
  Administered 2022-02-20 – 2022-02-25 (×11): 40 mg via ORAL
  Filled 2022-02-20 (×11): qty 1

## 2022-02-20 MED ORDER — MELATONIN 3 MG PO TABS
3.0000 mg | ORAL_TABLET | Freq: Every day | ORAL | Status: DC
  Administered 2022-02-20 – 2022-02-24 (×5): 3 mg via ORAL
  Filled 2022-02-20 (×5): qty 1

## 2022-02-20 NOTE — Progress Notes (Signed)
Mobility Specialist - Progress Note   02/20/22 1153  Oxygen Therapy  O2 Device Nasal Cannula  O2 Flow Rate (L/min) 3 L/min  Mobility  Activity Ambulated independently in hallway  Level of Assistance Independent  Assistive Device None  Distance Ambulated (ft) 500 ft  Activity Response Tolerated well  Mobility Referral Yes  $Mobility charge 1 Mobility   Pt received in bed and agreeable to mobility. Pt SOB throughout session requiring 2x pursed lip breathing breaks in hallway. No complaints during mobility. Pt to recliner after session with all needs met.    Pre-mobility: 86 HR, 96% SpO2 (3L [place]) During mobility: 92 HR, 96%  SpO2 (3L [place]) Post-mobility: 88 HR, 98% SPO2 (3L [place])  Set designer

## 2022-02-20 NOTE — Progress Notes (Signed)
PROGRESS NOTE    Kathi Dohn  QIW:[id number] DOB: 1955/08/20 DOA: 02/17/2022 PCP: [name], NP   Brief Narrative:   66 y.o. female with medical history significant for stage IV right lung adenocarcinoma on active chemotherapy, COPD, PE/DVT on Eliquis, anemia, ascending aortic dilatation who presented to the ED for evaluation of dyspnea.  CT angiogram did not show pulm embolism.  No clear pneumonia noted either.  Patient was noted to have COPD exacerbation.   She was started on IV steroids.  Assessment & Plan:   COPD exacerbation Acute respiratory failure with hypoxia History of tobacco abuse -Currently still on 3 L oxygen via nasal cannula.  Wean off as able. -Continue Solu-Medrol 80 mg IV every 12 hours.  Continue DuoNebs and inhaled Dulera.  Currently also on oral Zithromax. -Might need outpatient pulmonary evaluation and follow-up. -Apparently recently stopped smoking after 45-pack-year history of smoking.  Stage IV adenocarcinoma of right lung -Followed by Dr. Julien Nordmann.  On active chemotherapy with carboplatin, Alimta and Keytruda.  Last dose on December 26. -CTA chest shows slight increased size of spiculated right upper lobe lesion, and increased number of bilateral groundglass nodular lesion suspected to be intraorgan metastases. -Outpatient follow-up with Dr. Julien Nordmann.  He has been added to the care teams on epic  Anemia of chronic disease due to chemotherapy -Hemoglobin 7.1 on 02/18/2022.  Status post 2 units packed red cells transfusion.  Hemoglobin 10.7 today.  -Patient complains of black stools.  Will check fecal occult blood test.  Start Protonix twice a day.  Hypokalemia/hypomagnesemia -Improved  History of pulmonary embolism Continue Eliquis  DVT Prophylaxis: On Eliquis Code Status: Full code Family Communication: Discussed with patient Disposition Plan: Hopefully return home when improved   Status is: Inpatient Remains inpatient appropriate because:  COPD exacerbation   Antimicrobials: Zithromax   Subjective: Patient seen and examined at bedside.  Feels very weak and tired; did not sleep much last night.  Complains of diarrhea with blood in stools.  Still short of breath with exertion.  Not ready to go home today.   Objective: Vitals:   02/19/22 1554 02/19/22 2050 02/20/22 0546 02/20/22 0908  BP: 112/61 119/66 123/71   Pulse: 87 84 83   Resp: 17 20 18    Temp: 98.2 F (36.8 C) 98 F (36.7 C) 98 F (36.7 C)   TempSrc: Oral Oral Oral   SpO2: 99% 98% 99% 98%   Examination:  General: On 3 L oxygen via nasal cannula.  No distress.  Looks chronically ill and deconditioned. ENT/neck: No thyromegaly.  JVD is not elevated  respiratory: Decreased breath sounds at bases bilaterally with some crackles; no wheezing  CVS: S1-S2 heard, rate controlled currently Abdominal: Soft, nontender, slightly distended; no organomegaly, bowel sounds are heard Extremities: Trace lower extremity edema; no cyanosis  CNS: Awake and alert.  No focal neurologic deficit.  Moves extremities Lymph: No obvious lymphadenopathy Skin: No obvious ecchymosis/lesions  psych: Mostly flat affect.  Showing no signs of agitation.   Musculoskeletal: No obvious joint swelling/deformity    Data Reviewed: I have personally reviewed following labs and imaging studies  CBC: Recent Labs  Lab 02/15/22 1115 02/17/22 1935 02/18/22 0712 02/18/22 2215 02/19/22 0744 02/20/22 0758  WBC 8.3 13.7* 7.8  --  13.7* 12.8*  NEUTROABS 3.6 8.5*  --   --   --  10.9*  HGB 8.2* 9.0* 7.1* 10.0* 10.5* 10.7*  HCT 23.9* 27.5* 22.0* 29.7* 31.4* 33.5*  MCV 95.6 98.9 101.9*  --  95.7 99.4  PLT 314 370 227  --  255 573    Basic Metabolic Panel: Recent Labs  Lab 02/15/22 1115 02/17/22 1935 02/18/22 0712 02/19/22 0744 02/20/22 0758  NA 139 138 135 138 140  K 2.9* 3.1* 3.2* 3.6 3.8  CL 103 106 106 107 108  CO2 28 24 22  21* 25  GLUCOSE 103* 116* 167* 137* 102*  BUN 11 14 15 22   24*  CREATININE 1.05* 1.04* 1.04* 1.08* 1.08*  CALCIUM 9.0 9.3 8.9 9.1 9.1  MG  --   --  1.3* 2.6* 2.0    GFR: Estimated Creatinine Clearance: 36.2 mL/min (A) (by C-G formula based on SCr of 1.08 mg/dL (H)). Liver Function Tests: Recent Labs  Lab 02/15/22 1115 02/17/22 1935  AST 18 23  ALT 7 11  ALKPHOS 77 79  BILITOT 0.3 0.5  PROT 7.2 7.6  ALBUMIN 3.7 3.7    No results for input(s): "LIPASE", "AMYLASE" in the last 168 hours. No results for input(s): "AMMONIA" in the last 168 hours. Coagulation Profile: No results for input(s): "INR", "PROTIME" in the last 168 hours. Cardiac Enzymes: No results for input(s): "CKTOTAL", "CKMB", "CKMBINDEX", "TROPONINI" in the last 168 hours. BNP (last 3 results) No results for input(s): "PROBNP" in the last 8760 hours. HbA1C: No results for input(s): "HGBA1C" in the last 72 hours. CBG: No results for input(s): "GLUCAP" in the last 168 hours. Lipid Profile: No results for input(s): "CHOL", "HDL", "LDLCALC", "TRIG", "CHOLHDL", "LDLDIRECT" in the last 72 hours. Thyroid Function Tests: No results for input(s): "TSH", "T4TOTAL", "FREET4", "T3FREE", "THYROIDAB" in the last 72 hours. Anemia Panel: No results for input(s): "VITAMINB12", "FOLATE", "FERRITIN", "TIBC", "IRON", "RETICCTPCT" in the last 72 hours. Sepsis Labs: No results for input(s): "PROCALCITON", "LATICACIDVEN" in the last 168 hours.  Recent Results (from the past 240 hour(s))  Resp panel by RT-PCR (RSV, Flu A&B, Covid) Anterior Nasal Swab     Status: None   Collection Time: 02/17/22  7:12 PM   Specimen: Anterior Nasal Swab  Result Value Ref Range Status   SARS Coronavirus 2 by RT PCR NEGATIVE NEGATIVE Final    Comment: (NOTE) SARS-CoV-2 target nucleic acids are NOT DETECTED.  The SARS-CoV-2 RNA is generally detectable in upper respiratory specimens during the acute phase of infection. The lowest concentration of SARS-CoV-2 viral copies this assay can detect is 138  copies/mL. A negative result does not preclude SARS-Cov-2 infection and should not be used as the sole basis for treatment or other patient management decisions. A negative result may occur with  improper specimen collection/handling, submission of specimen other than nasopharyngeal swab, presence of viral mutation(s) within the areas targeted by this assay, and inadequate number of viral copies(<138 copies/mL). A negative result must be combined with clinical observations, patient history, and epidemiological information. The expected result is Negative.  Fact Sheet for Patients:  EntrepreneurPulse.com.au  Fact Sheet for Healthcare Providers:  IncredibleEmployment.be  This test is no t yet approved or cleared by the [place] FDA and  has been authorized for detection and/or diagnosis of SARS-CoV-2 by FDA under an Emergency Use Authorization (EUA). This EUA will remain  in effect (meaning this test can be used) for the duration of the COVID-19 declaration under Section 564(b)(1) of the Act, 21 U.S.C.section 360bbb-3(b)(1), unless the authorization is terminated  or revoked sooner.       Influenza A by PCR NEGATIVE NEGATIVE Final   Influenza B by PCR NEGATIVE NEGATIVE Final    Comment: (NOTE)  The Xpert Xpress SARS-CoV-2/FLU/RSV plus assay is intended as an aid in the diagnosis of influenza from Nasopharyngeal swab specimens and should not be used as a sole basis for treatment. Nasal washings and aspirates are unacceptable for Xpert Xpress SARS-CoV-2/FLU/RSV testing.  Fact Sheet for Patients: EntrepreneurPulse.com.au  Fact Sheet for Healthcare Providers: IncredibleEmployment.be  This test is not yet approved or cleared by the [place] FDA and has been authorized for detection and/or diagnosis of SARS-CoV-2 by FDA under an Emergency Use Authorization (EUA). This EUA will remain in effect (meaning  this test can be used) for the duration of the COVID-19 declaration under Section 564(b)(1) of the Act, 21 U.S.C. section 360bbb-3(b)(1), unless the authorization is terminated or revoked.     Resp Syncytial Virus by PCR NEGATIVE NEGATIVE Final    Comment: (NOTE) Fact Sheet for Patients: EntrepreneurPulse.com.au  Fact Sheet for Healthcare Providers: IncredibleEmployment.be  This test is not yet approved or cleared by the [place] FDA and has been authorized for detection and/or diagnosis of SARS-CoV-2 by FDA under an Emergency Use Authorization (EUA). This EUA will remain in effect (meaning this test can be used) for the duration of the COVID-19 declaration under Section 564(b)(1) of the Act, 21 U.S.C. section 360bbb-3(b)(1), unless the authorization is terminated or revoked.  Performed at [facility], [place] [address] [place], [place] [address]          Radiology Studies: No results found.      Scheduled Meds:  apixaban  5 mg Oral BID   azithromycin  500 mg Oral Daily   guaiFENesin  600 mg Oral BID   influenza vaccine adjuvanted  0.5 mL Intramuscular Tomorrow-1000   ipratropium-albuterol  3 mL Nebulization Q6H   magnesium oxide  400 mg Oral Daily   methylPREDNISolone (SOLU-MEDROL) injection  80 mg Intravenous Q12H   mometasone-formoterol  2 puff Inhalation BID   pneumococcal 20-valent conjugate vaccine  0.5 mL Intramuscular Tomorrow-1000   umeclidinium bromide  1 puff Inhalation Daily   Continuous Infusions:        Aline August, MD [facility] 02/20/2022, 10:57 AM  '

## 2022-02-20 NOTE — Progress Notes (Signed)
Administered pneumoccoccal 20 (Prevnar 20) vaccine 0.5 ml in left deltoid and influenza (Fluad ) vaccine in 0.5 ml in right deltoid. Patient tolerated well.

## 2022-02-21 DIAGNOSIS — J441 Chronic obstructive pulmonary disease with (acute) exacerbation: Secondary | ICD-10-CM | POA: Diagnosis not present

## 2022-02-21 DIAGNOSIS — J9601 Acute respiratory failure with hypoxia: Secondary | ICD-10-CM | POA: Diagnosis not present

## 2022-02-21 DIAGNOSIS — C3491 Malignant neoplasm of unspecified part of right bronchus or lung: Secondary | ICD-10-CM | POA: Diagnosis not present

## 2022-02-21 LAB — BASIC METABOLIC PANEL
Anion gap: 10 (ref 5–15)
BUN: 29 mg/dL — ABNORMAL HIGH (ref 8–23)
CO2: 21 mmol/L — ABNORMAL LOW (ref 22–32)
Calcium: 9 mg/dL (ref 8.9–10.3)
Chloride: 105 mmol/L (ref 98–111)
Creatinine, Ser: 1.11 mg/dL — ABNORMAL HIGH (ref 0.44–1.00)
GFR, Estimated: 55 mL/min — ABNORMAL LOW (ref >60)
Glucose, Bld: 165 mg/dL — ABNORMAL HIGH (ref 70–99)
Potassium: 3.6 mmol/L (ref 3.5–5.1)
Sodium: 136 mmol/L (ref 135–145)

## 2022-02-21 LAB — CBC WITH DIFFERENTIAL/PLATELET
Abs Immature Granulocytes: 0.29 10*3/uL — ABNORMAL HIGH (ref 0.00–0.07)
Basophils Absolute: 0 10*3/uL (ref 0.0–0.1)
Basophils Relative: 0 %
Eosinophils Absolute: 0 10*3/uL (ref 0.0–0.5)
Eosinophils Relative: 0 %
HCT: 35.3 % — ABNORMAL LOW (ref 36.0–46.0)
Hemoglobin: 11.4 g/dL — ABNORMAL LOW (ref 12.0–15.0)
Immature Granulocytes: 3 %
Lymphocytes Relative: 9 %
Lymphs Abs: 0.8 10*3/uL (ref 0.7–4.0)
MCH: 32.2 pg (ref 26.0–34.0)
MCHC: 32.3 g/dL (ref 30.0–36.0)
MCV: 99.7 fL (ref 80.0–100.0)
Monocytes Absolute: 0 10*3/uL — ABNORMAL LOW (ref 0.1–1.0)
Monocytes Relative: 0 %
Neutro Abs: 8.4 10*3/uL — ABNORMAL HIGH (ref 1.7–7.7)
Neutrophils Relative %: 88 %
Platelets: 245 10*3/uL (ref 150–400)
RBC: 3.54 MIL/uL — ABNORMAL LOW (ref 3.87–5.11)
RDW: 16.4 % — ABNORMAL HIGH (ref 11.5–15.5)
WBC: 9.5 10*3/uL (ref 4.0–10.5)
nRBC: 0 % (ref 0.0–0.2)

## 2022-02-21 LAB — MAGNESIUM: Magnesium: 2.1 mg/dL (ref 1.7–2.4)

## 2022-02-21 MED ORDER — STERILE WATER FOR INJECTION IJ SOLN
INTRAMUSCULAR | Status: AC
  Administered 2022-02-21: 2 mL
  Filled 2022-02-21: qty 10

## 2022-02-21 MED ORDER — ALPRAZOLAM 0.25 MG PO TABS
0.2500 mg | ORAL_TABLET | Freq: Three times a day (TID) | ORAL | Status: DC | PRN
  Administered 2022-02-21 – 2022-02-25 (×9): 0.25 mg via ORAL
  Filled 2022-02-21 (×9): qty 1

## 2022-02-21 MED ORDER — METHYLPREDNISOLONE SODIUM SUCC 125 MG IJ SOLR
80.0000 mg | Freq: Three times a day (TID) | INTRAMUSCULAR | Status: DC
  Administered 2022-02-21 – 2022-02-23 (×6): 80 mg via INTRAVENOUS
  Filled 2022-02-21 (×6): qty 2

## 2022-02-21 NOTE — Evaluation (Signed)
Physical Therapy Evaluation Patient Details Name: Sue Chase MRN: [id number] DOB: 15-Jun-1955 Today's Date: 02/21/2022  History of Present Illness  Pt is a 67yo female presenting to [facility] ED On 12/28 with complaints of SOB.   PMH: stage IV right lung adenocarcinoma on chemo last dose 12/26, COPD, hx of PE/DVT, HLD, rheumatic fever.  Clinical Impression  Patient evaluated by Physical Therapy with no further acute PT needs identified. All education has been completed and the patient has no further questions. Pt reports that other than her dyspnea she is at her baseline level of functioning. Demonstrated modified independence for bed mobility and transfers. Began ambulation task without AD but after 73f utilized RW with increased stability; RW utilized for remainder of task and emphasized importance of utilizing RW during acute stay and return home, pt verbalized understanding. Pt on [id number] throughout session, monitored via personal pulseox, ranged from 92-98%, cuing to utilize pursed lip breathing. Will keep pt on mobility caseload and encouraged pt to work with mobility specialist. See below for any follow-up Physical Therapy or equipment needs. PT is signing off. Thank you for this referral.       Recommendations for follow up therapy are one component of a multi-disciplinary discharge planning process, led by the attending physician.  Recommendations may be updated based on patient status, additional functional criteria and insurance authorization.  Follow Up Recommendations No PT follow up      Assistance Recommended at Discharge PRN  Patient can return home with the following  Assist for transportation;Help with stairs or ramp for entrance;A little help with walking and/or transfers    Equipment Recommendations None recommended by PT (pt has recommended DME)  Recommendations for Other Services       Functional Status Assessment Patient has not had a recent decline in their  functional status     Precautions / Restrictions Precautions Precautions: None Restrictions Weight Bearing Restrictions: No      Mobility  Bed Mobility Overal bed mobility: Modified Independent             General bed mobility comments: Pt sitting EOB upon entry    Transfers Overall transfer level: Modified independent Equipment used: None               General transfer comment: Increased time    Ambulation/Gait Ambulation/Gait assistance: Supervision Gait Distance (Feet): 160 Feet Assistive device: None, Rolling walker (2 wheels) Gait Pattern/deviations: WFL(Within Functional Limits) Gait velocity: decreased     General Gait Details: Pt ambulated without AD for first 41f took standing rest break, then utilized RW for remainder of ambulation task. Pt required supervision for safety, no physical assist required or overt LOB noted, VCs for proximity to device. Four standing rest breaks total with PLB, pt complaining of dyspnea and fatigue. Encouraged use of RW for increased safety, pt verbalized understanding  Stairs            Wheelchair Mobility    Modified Rankin (Stroke Patients Only)       Balance Overall balance assessment: Needs assistance Sitting-balance support: Feet supported, No upper extremity supported Sitting balance-Leahy Scale: Good     Standing balance support: Bilateral upper extremity supported, During functional activity, No upper extremity supported Standing balance-Leahy Scale: Fair Standing balance comment: Pt able to stand without BUE support, began dynamic mobility without BUE support but demonstrated improved safety and stability utilizing RW.  Pertinent Vitals/Pain Pain Assessment Pain Assessment: No/denies pain    Home Living Family/patient expects to be discharged to:: Private residence Living Arrangements: Alone Available Help at Discharge: Family;Available  PRN/intermittently (Daughter) Type of Home: Apartment Home Access: Level entry       Home Layout: One level Home Equipment: Conservation officer, nature (2 wheels);Shower seat      Prior Function Prior Level of Function : Independent/Modified Independent;Driving             Mobility Comments: IND ADLs Comments: IND     Hand Dominance        Extremity/Trunk Assessment   Upper Extremity Assessment Upper Extremity Assessment: RUE deficits/detail;LUE deficits/detail RUE Deficits / Details: Grip strength normal, MMT grossly 4/5 RUE Sensation: WNL LUE Deficits / Details: Grip strength normal, MMT grossly 4/5 LUE Sensation: WNL    Lower Extremity Assessment Lower Extremity Assessment: RLE deficits/detail;LLE deficits/detail RLE Deficits / Details: MMT hip/knee/ankle 4/5, functional ROM RLE Sensation: WNL LLE Deficits / Details: MMT hip/knee/ankle 4/5, functional ROM LLE Sensation: WNL    Cervical / Trunk Assessment Cervical / Trunk Assessment: Kyphotic  Communication   Communication: No difficulties  Cognition Arousal/Alertness: Awake/alert Behavior During Therapy: WFL for tasks assessed/performed Overall Cognitive Status: Within Functional Limits for tasks assessed                                          General Comments      Exercises     Assessment/Plan    PT Assessment All further PT needs can be met in the next venue of care  PT Problem List Cardiopulmonary status limiting activity;Decreased activity tolerance       PT Treatment Interventions      PT Goals (Current goals can be found in the Care Plan section)  Acute Rehab PT Goals Patient Stated Goal: To go home    Frequency       Co-evaluation               AM-PAC PT "6 Clicks" Mobility  Outcome Measure Help needed turning from your back to your side while in a flat bed without using bedrails?: None Help needed moving from lying on your back to sitting on the side of a flat bed  without using bedrails?: None Help needed moving to and from a bed to a chair (including a wheelchair)?: A Little Help needed standing up from a chair using your arms (e.g., wheelchair or bedside chair)?: A Little Help needed to walk in hospital room?: A Little Help needed climbing 3-5 steps with a railing? : A Little 6 Click Score: 20    End of Session Equipment Utilized During Treatment: Gait belt;Oxygen (3L via [place]) Activity Tolerance: Patient limited by fatigue Patient left: in chair;with call bell/phone within reach;with chair alarm set Nurse Communication: Mobility status PT Visit Diagnosis: Difficulty in walking, not elsewhere classified (R26.2)    Time: 6045-4098 PT Time Calculation (min) (ACUTE ONLY): 17 min   Charges:   PT Evaluation $PT Eval Low Complexity: [place], PT, DPT [facility] Office: [phone] Weekend pager: [phone]  Coolidge Breeze 02/21/2022, 12:30 PM

## 2022-02-21 NOTE — Progress Notes (Signed)
PROGRESS NOTE    Sue Chase  GHW:[id number] DOB: [birth year] DOA: 02/17/2022 PCP: [name], NP   Brief Narrative:   67 y.o. female with medical history significant for stage IV right lung adenocarcinoma on active chemotherapy, COPD, PE/DVT on Eliquis, anemia, ascending aortic dilatation who presented to the ED for evaluation of dyspnea.  CT angiogram did not show pulm embolism.  No clear pneumonia noted either.  Patient was noted to have COPD exacerbation.   She was started on IV steroids.  Assessment & Plan:   COPD exacerbation Acute respiratory failure with hypoxia History of tobacco abuse -Currently still on 3 L oxygen via nasal cannula.  Wean off as able. -Increase Solu-Medrol 80 mg IV to every 8 hours.  Continue DuoNebs and inhaled Dulera.  Currently also on oral Zithromax. -Might need outpatient pulmonary evaluation and follow-up. -Apparently recently stopped smoking after 45-pack-year history of smoking.  Stage IV adenocarcinoma of right lung -Followed by Dr. Julien Nordmann.  On active chemotherapy with carboplatin, Alimta and Keytruda.  Last dose on December 26. -CTA chest shows slight increased size of spiculated right upper lobe lesion, and increased number of bilateral groundglass nodular lesion suspected to be intraorgan metastases. -Outpatient follow-up with Dr. Julien Nordmann.  He has been added to the care teams on epic  Anemia of chronic disease due to chemotherapy -Hemoglobin 7.1 on 02/18/2022.  Status post 2 units packed red cells transfusion.  Hemoglobin pending today. -Patient complains of black stools. fecal occult blood test negative.  Continue Protonix twice a day orally.  Outpatient follow-up with GI.  Hypokalemia/hypomagnesemia -Improved  History of pulmonary embolism Continue Eliquis  DVT Prophylaxis: On Eliquis Code Status: Full code Family Communication: Discussed with patient Disposition Plan: Hopefully return home when improved   Status is:  Inpatient Remains inpatient appropriate because: COPD exacerbation.  Still requiring supplemental oxygen and IV steroids.   Antimicrobials: Zithromax   Subjective: Patient seen and examined at bedside.  Complains of intermittent headache.  Still extremely short of breath with minimal exertion.  Does not feel ready to go home today.   Objective: Vitals:   02/21/22 0252 02/21/22 0348 02/21/22 0542 02/21/22 0916  BP: 129/77  139/73   Pulse: 80  87   Resp: 17  17   Temp: 98 F (36.7 C)  97.8 F (36.6 C)   TempSrc: Oral  Oral   SpO2: 99% 98% 100% 98%  Weight:   48.9 kg    Examination:  General: No acute distress.  Still on 3 L oxygen via nasal cannula.  Looks chronically ill and deconditioned. ENT/neck: No palpable neck masses or JVD elevation noted.   Respiratory: Bilateral decreased breath sounds at bases with scattered crackles CVS: Rate mostly controlled; S1 and S2 are heard  abdominal: Soft, nontender, distended mildly; no organomegaly, normal bowel sounds.  Extremities: No clubbing; mild lower extremity edema present  CNS: Alert and oriented.  No focal neurologic deficit.  Able to move extremities Lymph: No cervical lymphadenopathy noted Skin: No obvious petechiae/rashes psych: Not agitated.  Flat affect mostly.   Musculoskeletal: No obvious joint erythema/tenderness   Data Reviewed: I have personally reviewed following labs and imaging studies  CBC: Recent Labs  Lab 02/15/22 1115 02/17/22 1935 02/18/22 0712 02/18/22 2215 02/19/22 0744 02/20/22 0758  WBC 8.3 13.7* 7.8  --  13.7* 12.8*  NEUTROABS 3.6 8.5*  --   --   --  10.9*  HGB 8.2* 9.0* 7.1* 10.0* 10.5* 10.7*  HCT 23.9* 27.5* 22.0* 29.7* 31.4*  33.5*  MCV 95.6 98.9 101.9*  --  95.7 99.4  PLT 314 370 227  --  255 785    Basic Metabolic Panel: Recent Labs  Lab 02/15/22 1115 02/17/22 1935 02/18/22 0712 02/19/22 0744 02/20/22 0758  NA 139 138 135 138 140  K 2.9* 3.1* 3.2* 3.6 3.8  CL 103 106 106 107 108   CO2 28 24 22  21* 25  GLUCOSE 103* 116* 167* 137* 102*  BUN 11 14 15 22  24*  CREATININE 1.05* 1.04* 1.04* 1.08* 1.08*  CALCIUM 9.0 9.3 8.9 9.1 9.1  MG  --   --  1.3* 2.6* 2.0    GFR: Estimated Creatinine Clearance: 39.6 mL/min (A) (by C-G formula based on SCr of 1.08 mg/dL (H)). Liver Function Tests: Recent Labs  Lab 02/15/22 1115 02/17/22 1935  AST 18 23  ALT 7 11  ALKPHOS 77 79  BILITOT 0.3 0.5  PROT 7.2 7.6  ALBUMIN 3.7 3.7    No results for input(s): "LIPASE", "AMYLASE" in the last 168 hours. No results for input(s): "AMMONIA" in the last 168 hours. Coagulation Profile: No results for input(s): "INR", "PROTIME" in the last 168 hours. Cardiac Enzymes: No results for input(s): "CKTOTAL", "CKMB", "CKMBINDEX", "TROPONINI" in the last 168 hours. BNP (last 3 results) No results for input(s): "PROBNP" in the last 8760 hours. HbA1C: No results for input(s): "HGBA1C" in the last 72 hours. CBG: No results for input(s): "GLUCAP" in the last 168 hours. Lipid Profile: No results for input(s): "CHOL", "HDL", "LDLCALC", "TRIG", "CHOLHDL", "LDLDIRECT" in the last 72 hours. Thyroid Function Tests: No results for input(s): "TSH", "T4TOTAL", "FREET4", "T3FREE", "THYROIDAB" in the last 72 hours. Anemia Panel: No results for input(s): "VITAMINB12", "FOLATE", "FERRITIN", "TIBC", "IRON", "RETICCTPCT" in the last 72 hours. Sepsis Labs: No results for input(s): "PROCALCITON", "LATICACIDVEN" in the last 168 hours.  Recent Results (from the past 240 hour(s))  Resp panel by RT-PCR (RSV, Flu A&B, Covid) Anterior Nasal Swab     Status: None   Collection Time: 02/17/22  7:12 PM   Specimen: Anterior Nasal Swab  Result Value Ref Range Status   SARS Coronavirus 2 by RT PCR NEGATIVE NEGATIVE Final    Comment: (NOTE) SARS-CoV-2 target nucleic acids are NOT DETECTED.  The SARS-CoV-2 RNA is generally detectable in upper respiratory specimens during the acute phase of infection. The  lowest concentration of SARS-CoV-2 viral copies this assay can detect is 138 copies/mL. A negative result does not preclude SARS-Cov-2 infection and should not be used as the sole basis for treatment or other patient management decisions. A negative result may occur with  improper specimen collection/handling, submission of specimen other than nasopharyngeal swab, presence of viral mutation(s) within the areas targeted by this assay, and inadequate number of viral copies(<138 copies/mL). A negative result must be combined with clinical observations, patient history, and epidemiological information. The expected result is Negative.  Fact Sheet for Patients:  EntrepreneurPulse.com.au  Fact Sheet for Healthcare Providers:  IncredibleEmployment.be  This test is no t yet approved or cleared by the [place] FDA and  has been authorized for detection and/or diagnosis of SARS-CoV-2 by FDA under an Emergency Use Authorization (EUA). This EUA will remain  in effect (meaning this test can be used) for the duration of the COVID-19 declaration under Section 564(b)(1) of the Act, 21 U.S.C.section 360bbb-3(b)(1), unless the authorization is terminated  or revoked sooner.       Influenza A by PCR NEGATIVE NEGATIVE Final   Influenza B by  PCR NEGATIVE NEGATIVE Final    Comment: (NOTE) The Xpert Xpress SARS-CoV-2/FLU/RSV plus assay is intended as an aid in the diagnosis of influenza from Nasopharyngeal swab specimens and should not be used as a sole basis for treatment. Nasal washings and aspirates are unacceptable for Xpert Xpress SARS-CoV-2/FLU/RSV testing.  Fact Sheet for Patients: EntrepreneurPulse.com.au  Fact Sheet for Healthcare Providers: IncredibleEmployment.be  This test is not yet approved or cleared by the [place] FDA and has been authorized for detection and/or diagnosis of SARS-CoV-2 by FDA under  an Emergency Use Authorization (EUA). This EUA will remain in effect (meaning this test can be used) for the duration of the COVID-19 declaration under Section 564(b)(1) of the Act, 21 U.S.C. section 360bbb-3(b)(1), unless the authorization is terminated or revoked.     Resp Syncytial Virus by PCR NEGATIVE NEGATIVE Final    Comment: (NOTE) Fact Sheet for Patients: EntrepreneurPulse.com.au  Fact Sheet for Healthcare Providers: IncredibleEmployment.be  This test is not yet approved or cleared by the [place] FDA and has been authorized for detection and/or diagnosis of SARS-CoV-2 by FDA under an Emergency Use Authorization (EUA). This EUA will remain in effect (meaning this test can be used) for the duration of the COVID-19 declaration under Section 564(b)(1) of the Act, 21 U.S.C. section 360bbb-3(b)(1), unless the authorization is terminated or revoked.  Performed at [facility], [place] [address] [place], [place] [address]          Radiology Studies: No results found.      Scheduled Meds:  apixaban  5 mg Oral BID   azithromycin  500 mg Oral Daily   guaiFENesin  600 mg Oral BID   ipratropium-albuterol  3 mL Nebulization Q6H   magnesium oxide  400 mg Oral Daily   melatonin  3 mg Oral QHS   methylPREDNISolone (SOLU-MEDROL) injection  80 mg Intravenous Q12H   mometasone-formoterol  2 puff Inhalation BID   pantoprazole  40 mg Oral BID AC   umeclidinium bromide  1 puff Inhalation Daily   Continuous Infusions:        Aline August, MD [facility] 02/21/2022, 9:44 AM  '

## 2022-02-21 NOTE — Care Management Important Message (Signed)
Important Message  Patient Details IM Letter given. Name: Sue Chase MRN: [id number] Date of Birth: 25-Feb-1955   Medicare Important Message Given:  Yes     Kerin Salen 02/21/2022, 9:51 AM

## 2022-02-22 ENCOUNTER — Other Ambulatory Visit: Payer: Medicare Other

## 2022-02-22 ENCOUNTER — Inpatient Hospital Stay (HOSPITAL_COMMUNITY): Payer: Medicare Other

## 2022-02-22 ENCOUNTER — Telehealth: Payer: Self-pay | Admitting: Medical Oncology

## 2022-02-22 DIAGNOSIS — D649 Anemia, unspecified: Secondary | ICD-10-CM | POA: Diagnosis not present

## 2022-02-22 DIAGNOSIS — R0609 Other forms of dyspnea: Secondary | ICD-10-CM

## 2022-02-22 DIAGNOSIS — C3491 Malignant neoplasm of unspecified part of right bronchus or lung: Secondary | ICD-10-CM | POA: Diagnosis not present

## 2022-02-22 DIAGNOSIS — J9601 Acute respiratory failure with hypoxia: Secondary | ICD-10-CM | POA: Diagnosis not present

## 2022-02-22 DIAGNOSIS — J441 Chronic obstructive pulmonary disease with (acute) exacerbation: Secondary | ICD-10-CM | POA: Diagnosis not present

## 2022-02-22 LAB — CBC WITH DIFFERENTIAL/PLATELET
Abs Immature Granulocytes: 0.21 10*3/uL — ABNORMAL HIGH (ref 0.00–0.07)
Basophils Absolute: 0 10*3/uL (ref 0.0–0.1)
Basophils Relative: 0 %
Eosinophils Absolute: 0 10*3/uL (ref 0.0–0.5)
Eosinophils Relative: 0 %
HCT: 36 % (ref 36.0–46.0)
Hemoglobin: 11.6 g/dL — ABNORMAL LOW (ref 12.0–15.0)
Immature Granulocytes: 4 %
Lymphocytes Relative: 13 %
Lymphs Abs: 0.7 10*3/uL (ref 0.7–4.0)
MCH: 32.3 pg (ref 26.0–34.0)
MCHC: 32.2 g/dL (ref 30.0–36.0)
MCV: 100.3 fL — ABNORMAL HIGH (ref 80.0–100.0)
Monocytes Absolute: 0 10*3/uL — ABNORMAL LOW (ref 0.1–1.0)
Monocytes Relative: 0 %
Neutro Abs: 4.3 10*3/uL (ref 1.7–7.7)
Neutrophils Relative %: 83 %
Platelets: 198 10*3/uL (ref 150–400)
RBC: 3.59 MIL/uL — ABNORMAL LOW (ref 3.87–5.11)
RDW: 16.2 % — ABNORMAL HIGH (ref 11.5–15.5)
WBC: 5.2 10*3/uL (ref 4.0–10.5)
nRBC: 0 % (ref 0.0–0.2)

## 2022-02-22 LAB — ECHOCARDIOGRAM COMPLETE
Area-P 1/2: 2.69 cm2
Height: 63 in
S' Lateral: 2.7 cm
Weight: 1724.88 oz

## 2022-02-22 LAB — BASIC METABOLIC PANEL
Anion gap: 7 (ref 5–15)
BUN: 30 mg/dL — ABNORMAL HIGH (ref 8–23)
CO2: 25 mmol/L (ref 22–32)
Calcium: 9.1 mg/dL (ref 8.9–10.3)
Chloride: 105 mmol/L (ref 98–111)
Creatinine, Ser: 1.03 mg/dL — ABNORMAL HIGH (ref 0.44–1.00)
GFR, Estimated: 60 mL/min — ABNORMAL LOW (ref >60)
Glucose, Bld: 130 mg/dL — ABNORMAL HIGH (ref 70–99)
Potassium: 4.2 mmol/L (ref 3.5–5.1)
Sodium: 137 mmol/L (ref 135–145)

## 2022-02-22 LAB — MAGNESIUM: Magnesium: 2.1 mg/dL (ref 1.7–2.4)

## 2022-02-22 MED ORDER — FUROSEMIDE 10 MG/ML IJ SOLN: 80.0000 mg | Freq: Once | INTRAMUSCULAR | Status: DC

## 2022-02-22 NOTE — Telephone Encounter (Signed)
Appts -I told Sue Chase that I will cancel her appts for Thursday since she is having daily labs and her HGB is good. I told her to keep her appts for next week and to look on mychart.

## 2022-02-22 NOTE — Progress Notes (Signed)
Mobility Specialist - Progress Note   02/22/22 1534  Oxygen Therapy  O2 Device Nasal Cannula  O2 Flow Rate (L/min) 3 L/min  Mobility  Activity Ambulated independently in hallway  Level of Assistance Independent  Assistive Device None  Distance Ambulated (ft) 320 ft  Activity Response Tolerated well  Mobility Referral Yes  $Mobility charge 1 Mobility   Pt received EOB and agreeable to mobility. Pt took 2x standing rest breaks w/ encouraged pursed lip breathing (~71mn). No other complaints during mobility. Pt to bed after session with all needs met.    Pre-mobility: 85 HR, 97%  SpO2 (3L [place]) During mobility: 103 HR, 96% SpO2 (3L [place]) Post-mobility: 93 HR, 95% SPO2 (3L [place])  MSet designer

## 2022-02-22 NOTE — Progress Notes (Signed)
*  PRELIMINARY RESULTS* Echocardiogram 2D Echocardiogram has been performed.  Sue Chase 02/22/2022, 4:00 PM

## 2022-02-22 NOTE — Progress Notes (Signed)
Mobility Specialist - Progress Note   02/22/22 1147  Oxygen Therapy  O2 Device Nasal Cannula  O2 Flow Rate (L/min) 3 L/min  Mobility  Activity Ambulated independently in hallway  Level of Assistance Independent  Assistive Device None  Distance Ambulated (ft) 320 ft  Activity Response Tolerated well  Mobility Referral Yes  $Mobility charge 1 Mobility   Pt received in bed and agreeable to mobility. Pt took one seated rest break (~20mn) due to SOB. Encouraged pt to practice pursed lip breathing to help w/ SOB. Pt O2 at 95% during seated rest break. No other complaints during mobility. Pt to bed after session with all needs met.    Pre-mobility: 92 HR, 96% SpO2 During mobility: 101 HR, 95% SpO2 Post-mobility: 95 HR, 95% SPO2  MSet designer

## 2022-02-22 NOTE — Progress Notes (Signed)
PROGRESS NOTE    Sue Chase  TDV:[id number] DOB: Feb 03, 1956 DOA: 02/17/2022 PCP: [name], NP   Brief Narrative:   67 y.o. female with medical history significant for stage IV right lung adenocarcinoma on active chemotherapy, COPD, PE/DVT on Eliquis, anemia, ascending aortic dilatation who presented to the ED for evaluation of dyspnea.  CT angiogram did not show pulm embolism.  No clear pneumonia noted either.  Patient was noted to have COPD exacerbation.   She was started on IV steroids.  Assessment & Plan:   COPD exacerbation Acute respiratory failure with hypoxia History of tobacco abuse -Currently still on 3 L oxygen via nasal cannula.  Wean off as able. -Continue Solu-Medrol 80 mg IV every 8 hours.  Continue DuoNebs and inhaled Dulera.  . -Might need outpatient pulmonary evaluation and follow-up. -Apparently recently stopped smoking after 45-pack-year history of smoking.  Stage IV adenocarcinoma of right lung -Followed by Dr. Julien Nordmann.  On active chemotherapy with carboplatin, Alimta and Keytruda.  Last dose on December 26. -CTA chest shows slight increased size of spiculated right upper lobe lesion, and increased number of bilateral groundglass nodular lesion suspected to be intraorgan metastases. -Outpatient follow-up with Dr. Julien Nordmann.  He has been added to the care teams on epic  Anemia of chronic disease due to chemotherapy -Hemoglobin 7.1 on 02/18/2022.  Status post 2 units packed red cells transfusion.  Hemoglobin 11.6 today. -Patient complained of black stools. fecal occult blood test negative.  Continue Protonix twice a day orally.  Outpatient follow-up with GI.  Hypokalemia/hypomagnesemia -Improved  History of pulmonary embolism Continue Eliquis  DVT Prophylaxis: On Eliquis Code Status: Full code Family Communication: Discussed with patient Disposition Plan: Hopefully return home when improved   Status is: Inpatient Remains inpatient appropriate  because: COPD exacerbation.  Still requiring supplemental oxygen and IV steroids.   Antimicrobials: Zithromax from 02/17/2022-02/21/2022   Subjective: Patient seen and examined at bedside.  Feels slightly better today but still extremely short of breath with exertion with intermittent anxiety.  Does not feel ready to go home today as well.  No fever, vomiting, chest pain reported.   Objective: Vitals:   02/21/22 1353 02/21/22 2052 02/21/22 2244 02/22/22 0508  BP:  (!) 142/86  (!) 141/85  Pulse:  85  89  Resp:  20  18  Temp:  98 F (36.7 C)  97.6 F (36.4 C)  TempSrc:  Oral  Oral  SpO2: 98% 98%  99%  Weight:      Height:   5\' 3"  (1.6 m)    Examination:  General: On 3 L oxygen via nasal.  Looks intermittently anxious.  No distress.  respiratory: Decreased breath sounds at bases bilaterally with some crackles and wheezing CVS: Currently rate controlled; S1-S2 heard  abdominal: Soft, nontender, slightly distended, no organomegaly; normal bowel sounds are heard  extremities: Trace lower extremity edema; no clubbing.      Data Reviewed: I have personally reviewed following labs and imaging studies  CBC: Recent Labs  Lab 02/15/22 1115 02/15/22 1115 02/17/22 1935 02/18/22 0712 02/18/22 2215 02/19/22 0744 02/20/22 0758 02/21/22 0851 02/22/22 0728  WBC 8.3   < > 13.7* 7.8  --  13.7* 12.8* 9.5 5.2  NEUTROABS 3.6  --  8.5*  --   --   --  10.9* 8.4* 4.3  HGB 8.2*   < > 9.0* 7.1* 10.0* 10.5* 10.7* 11.4* 11.6*  HCT 23.9*  --  27.5* 22.0* 29.7* 31.4* 33.5* 35.3* 36.0  MCV 95.6  --  98.9 101.9*  --  95.7 99.4 99.7 100.3*  PLT 314   < > 370 227  --  255 260 245 198   < > = values in this interval not displayed.    Basic Metabolic Panel: Recent Labs  Lab 02/18/22 0712 02/19/22 0744 02/20/22 0758 02/21/22 0851 02/22/22 0728  NA 135 138 140 136 137  K 3.2* 3.6 3.8 3.6 4.2  CL 106 107 108 105 105  CO2 22 21* 25 21* 25  GLUCOSE 167* 137* 102* 165* 130*  BUN 15 22 24* 29* 30*   CREATININE 1.04* 1.08* 1.08* 1.11* 1.03*  CALCIUM 8.9 9.1 9.1 9.0 9.1  MG 1.3* 2.6* 2.0 2.1 2.1    GFR: Estimated Creatinine Clearance: 41.5 mL/min (A) (by C-G formula based on SCr of 1.03 mg/dL (H)). Liver Function Tests: Recent Labs  Lab 02/15/22 1115 02/17/22 1935  AST 18 23  ALT 7 11  ALKPHOS 77 79  BILITOT 0.3 0.5  PROT 7.2 7.6  ALBUMIN 3.7 3.7    No results for input(s): "LIPASE", "AMYLASE" in the last 168 hours. No results for input(s): "AMMONIA" in the last 168 hours. Coagulation Profile: No results for input(s): "INR", "PROTIME" in the last 168 hours. Cardiac Enzymes: No results for input(s): "CKTOTAL", "CKMB", "CKMBINDEX", "TROPONINI" in the last 168 hours. BNP (last 3 results) No results for input(s): "PROBNP" in the last 8760 hours. HbA1C: No results for input(s): "HGBA1C" in the last 72 hours. CBG: No results for input(s): "GLUCAP" in the last 168 hours. Lipid Profile: No results for input(s): "CHOL", "HDL", "LDLCALC", "TRIG", "CHOLHDL", "LDLDIRECT" in the last 72 hours. Thyroid Function Tests: No results for input(s): "TSH", "T4TOTAL", "FREET4", "T3FREE", "THYROIDAB" in the last 72 hours. Anemia Panel: No results for input(s): "VITAMINB12", "FOLATE", "FERRITIN", "TIBC", "IRON", "RETICCTPCT" in the last 72 hours. Sepsis Labs: No results for input(s): "PROCALCITON", "LATICACIDVEN" in the last 168 hours.  Recent Results (from the past 240 hour(s))  Resp panel by RT-PCR (RSV, Flu A&B, Covid) Anterior Nasal Swab     Status: None   Collection Time: 02/17/22  7:12 PM   Specimen: Anterior Nasal Swab  Result Value Ref Range Status   SARS Coronavirus 2 by RT PCR NEGATIVE NEGATIVE Final    Comment: (NOTE) SARS-CoV-2 target nucleic acids are NOT DETECTED.  The SARS-CoV-2 RNA is generally detectable in upper respiratory specimens during the acute phase of infection. The lowest concentration of SARS-CoV-2 viral copies this assay can detect is 138 copies/mL. A  negative result does not preclude SARS-Cov-2 infection and should not be used as the sole basis for treatment or other patient management decisions. A negative result may occur with  improper specimen collection/handling, submission of specimen other than nasopharyngeal swab, presence of viral mutation(s) within the areas targeted by this assay, and inadequate number of viral copies(<138 copies/mL). A negative result must be combined with clinical observations, patient history, and epidemiological information. The expected result is Negative.  Fact Sheet for Patients:  EntrepreneurPulse.com.au  Fact Sheet for Healthcare Providers:  IncredibleEmployment.be  This test is no t yet approved or cleared by the [place] FDA and  has been authorized for detection and/or diagnosis of SARS-CoV-2 by FDA under an Emergency Use Authorization (EUA). This EUA will remain  in effect (meaning this test can be used) for the duration of the COVID-19 declaration under Section 564(b)(1) of the Act, 21 U.S.C.section 360bbb-3(b)(1), unless the authorization is terminated  or revoked sooner.       Influenza  A by PCR NEGATIVE NEGATIVE Final   Influenza B by PCR NEGATIVE NEGATIVE Final    Comment: (NOTE) The Xpert Xpress SARS-CoV-2/FLU/RSV plus assay is intended as an aid in the diagnosis of influenza from Nasopharyngeal swab specimens and should not be used as a sole basis for treatment. Nasal washings and aspirates are unacceptable for Xpert Xpress SARS-CoV-2/FLU/RSV testing.  Fact Sheet for Patients: EntrepreneurPulse.com.au  Fact Sheet for Healthcare Providers: IncredibleEmployment.be  This test is not yet approved or cleared by the [place] FDA and has been authorized for detection and/or diagnosis of SARS-CoV-2 by FDA under an Emergency Use Authorization (EUA). This EUA will remain in effect (meaning this test can  be used) for the duration of the COVID-19 declaration under Section 564(b)(1) of the Act, 21 U.S.C. section 360bbb-3(b)(1), unless the authorization is terminated or revoked.     Resp Syncytial Virus by PCR NEGATIVE NEGATIVE Final    Comment: (NOTE) Fact Sheet for Patients: EntrepreneurPulse.com.au  Fact Sheet for Healthcare Providers: IncredibleEmployment.be  This test is not yet approved or cleared by the [place] FDA and has been authorized for detection and/or diagnosis of SARS-CoV-2 by FDA under an Emergency Use Authorization (EUA). This EUA will remain in effect (meaning this test can be used) for the duration of the COVID-19 declaration under Section 564(b)(1) of the Act, 21 U.S.C. section 360bbb-3(b)(1), unless the authorization is terminated or revoked.  Performed at [facility], [place] [address] [place], [place] [address]          Radiology Studies: No results found.      Scheduled Meds:  apixaban  5 mg Oral BID   guaiFENesin  600 mg Oral BID   ipratropium-albuterol  3 mL Nebulization Q6H   magnesium oxide  400 mg Oral Daily   melatonin  3 mg Oral QHS   methylPREDNISolone (SOLU-MEDROL) injection  80 mg Intravenous Q8H   mometasone-formoterol  2 puff Inhalation BID   pantoprazole  40 mg Oral BID AC   umeclidinium bromide  1 puff Inhalation Daily   Continuous Infusions:        Aline August, MD [facility] 02/22/2022, 9:12 AM  '

## 2022-02-23 DIAGNOSIS — J9601 Acute respiratory failure with hypoxia: Secondary | ICD-10-CM | POA: Diagnosis not present

## 2022-02-23 MED ORDER — METHYLPREDNISOLONE SODIUM SUCC 125 MG IJ SOLR
80.0000 mg | Freq: Two times a day (BID) | INTRAMUSCULAR | Status: DC
  Administered 2022-02-23 – 2022-02-24 (×2): 80 mg via INTRAVENOUS
  Filled 2022-02-23 (×2): qty 2

## 2022-02-23 MED ORDER — ALBUTEROL SULFATE (2.5 MG/3ML) 0.083% IN NEBU
2.5000 mg | INHALATION_SOLUTION | Freq: Four times a day (QID) | RESPIRATORY_TRACT | Status: DC
  Administered 2022-02-23 – 2022-02-25 (×11): 2.5 mg via RESPIRATORY_TRACT
  Filled 2022-02-23 (×11): qty 3

## 2022-02-23 MED ORDER — STERILE WATER FOR INJECTION IJ SOLN
INTRAMUSCULAR | Status: AC
  Filled 2022-02-23: qty 10

## 2022-02-23 NOTE — Progress Notes (Signed)
DIAGNOSIS:  Stage IV (T2a, N0, M1a) non-small cell lung cancer, adenocarcinoma presented with multifocal bilateral pulmonary nodules involving the right upper lobe as well as the smaller bilateral groundglass opacities diagnosed in April 2023.  PD-L1 expression is 4%.  Molecular studies by [employer] 360 tissue test showed positive KRAS G12C mutation but the blood test failed secondary to insufficient circulating tumor DNA.   PRIOR THERAPY: None   CURRENT THERAPY: Systemic chemotherapy with carboplatin for AUC of 5, Alimta 500 Mg/M2 and Keytruda 200 Mg IV every 3 weeks.  First dose 08/10/2021.  Status post 8 cycles.  Starting from cycle #5 the patient is on maintenance treatment with Alimta and Keytruda every 3 weeks.  Subjective: The patient is seen and examined today.  She continues to complain of the shortness of breath with exertion.  She denied having any fever or chills.  She has no chest pain but continues to have wheezing.  She has no hemoptysis.  She denied having any recent weight loss or night sweats.  She was admitted with worsening dyspnea.  CT angiogram of the chest on the day of admission showed no evidence for pulmonary embolism but there was innumerable bilateral groundglass nodular lesions suspected for intra organ metastasis.  There was also diffuse bronchial thickening and mild emphysema as well as mild body wall anasarca concerning for cachexia and malnutrition.  She had 2D echo performed yesterday that showed ejection fraction of 60 to 65%.  Objective: Vital signs in last 24 hours: Temp:  [97.4 F (36.3 C)-98.3 F (36.8 C)] 97.4 F (36.3 C) (01/03 0459) Pulse Rate:  [84-98] 98 (01/03 0459) Resp:  [16] 16 (01/03 0459) BP: (137-174)/(78-88) 150/78 (01/03 0459) SpO2:  [96 %-100 %] 99 % (01/03 0459) Weight:  [101 lb 13.6 oz (46.2 kg)] 101 lb 13.6 oz (46.2 kg) (01/03 0459)  Intake/Output from previous day: 01/02 0701 - 01/03 0700 In: 480 [P.O.:480] Out: -  Intake/Output  this shift: No intake/output data recorded.  General appearance: alert, appears stated age, fatigued, and no distress Resp: wheezes LUL Cardio: regular rate and rhythm, S1, S2 normal, no murmur, click, rub or gallop GI: soft, non-tender; bowel sounds normal; no masses,  no organomegaly Extremities: edema trace  Lab Results:  Recent Labs    02/21/22 0851 02/22/22 0728  WBC 9.5 5.2  HGB 11.4* 11.6*  HCT 35.3* 36.0  PLT 245 198   BMET Recent Labs    02/21/22 0851 02/22/22 0728  NA 136 137  K 3.6 4.2  CL 105 105  CO2 21* 25  GLUCOSE 165* 130*  BUN 29* 30*  CREATININE 1.11* 1.03*  CALCIUM 9.0 9.1    Studies/Results: ECHOCARDIOGRAM COMPLETE  Result Date: 02/22/2022    ECHOCARDIOGRAM REPORT   Patient Name:   Sue Chase Date of Exam: 02/22/2022 Medical Rec #:  [id number]           Height:       63.0 in Accession #:    [id number]          Weight:       107.8 lb Date of Birth:  03-02-1955            BSA:          1.487 m Patient Age:    67 years            BP:           141/85 mmHg Patient Gender: F  HR:           89 bpm. Exam Location:  Inpatient Procedure: 2D Echo, Cardiac Doppler and Color Doppler Indications:    R06.00 Dyspnea  History:        Patient has prior history of Echocardiogram examinations, most                 recent 02/19/2021. COPD; Risk Factors:Dyslipidemia and Current                 Smoker. Acute respiratory failure with hypoxia ([place]), Hx of                 Acute pulmonary embolism ([place]).  Sonographer:    Alvino Chapel RCS Referring Phys: [id number] [place]  1. Left ventricular ejection fraction, by estimation, is 60 to 65%. The left ventricle has normal function. The left ventricle has no regional wall motion abnormalities. Left ventricular diastolic parameters are consistent with Grade I diastolic dysfunction (impaired relaxation).  2. Right ventricular systolic function is normal. The right ventricular size is normal. There is  normal pulmonary artery systolic pressure. The estimated right ventricular systolic pressure is 41.6 mmHg.  3. The mitral valve is normal in structure. No evidence of mitral valve regurgitation. No evidence of mitral stenosis.  4. The aortic valve is normal in structure. Aortic valve regurgitation is not visualized. No aortic stenosis is present.  5. The inferior vena cava is normal in size with greater than 50% respiratory variability, suggesting right atrial pressure of 3 mmHg. Comparison(s): No significant change from prior study. Prior images reviewed side by side. FINDINGS  Left Ventricle: Left ventricular ejection fraction, by estimation, is 60 to 65%. The left ventricle has normal function. The left ventricle has no regional wall motion abnormalities. The left ventricular internal cavity size was normal in size. There is  no left ventricular hypertrophy. Left ventricular diastolic parameters are consistent with Grade I diastolic dysfunction (impaired relaxation). Indeterminate filling pressures. Right Ventricle: The right ventricular size is normal. No increase in right ventricular wall thickness. Right ventricular systolic function is normal. There is normal pulmonary artery systolic pressure. The tricuspid regurgitant velocity is 2.29 m/s, and  with an assumed right atrial pressure of 3 mmHg, the estimated right ventricular systolic pressure is 60.6 mmHg. Left Atrium: Left atrial size was normal in size. Right Atrium: Right atrial size was normal in size. Pericardium: There is no evidence of pericardial effusion. Mitral Valve: The mitral valve is normal in structure. No evidence of mitral valve regurgitation. No evidence of mitral valve stenosis. Tricuspid Valve: The tricuspid valve is normal in structure. Tricuspid valve regurgitation is not demonstrated. No evidence of tricuspid stenosis. Aortic Valve: The aortic valve is normal in structure. Aortic valve regurgitation is not visualized. No aortic  stenosis is present. Pulmonic Valve: The pulmonic valve was normal in structure. Pulmonic valve regurgitation is not visualized. No evidence of pulmonic stenosis. Aorta: The aortic root is normal in size and structure. Venous: The inferior vena cava is normal in size with greater than 50% respiratory variability, suggesting right atrial pressure of 3 mmHg. IAS/Shunts: No atrial level shunt detected by color flow Doppler.  LEFT VENTRICLE PLAX 2D LVIDd:         4.40 cm   Diastology LVIDs:         2.70 cm   LV e' medial:    6.63 cm/s LV PW:         0.90 cm   LV E/e'  medial:  13.3 LV IVS:        0.80 cm   LV e' lateral:   8.49 cm/s LVOT diam:     1.80 cm   LV E/e' lateral: 10.4 LV SV:         57 LV SV Index:   38 LVOT Area:     2.54 cm  RIGHT VENTRICLE RV S prime:     18.40 cm/s TAPSE (M-mode): 2.4 cm LEFT ATRIUM             Index        RIGHT ATRIUM           Index LA diam:        3.00 cm 2.02 cm/m   RA Area:     14.70 cm LA Vol (A2C):   41.7 ml 28.04 ml/m  RA Volume:   38.80 ml  26.09 ml/m LA Vol (A4C):   37.9 ml 25.49 ml/m LA Biplane Vol: 40.7 ml 27.37 ml/m  AORTIC VALVE LVOT Vmax:   115.00 cm/s LVOT Vmean:  75.100 cm/s LVOT VTI:    0.224 m  AORTA Ao Root diam: 3.00 cm MITRAL VALVE                TRICUSPID VALVE MV Area (PHT): 2.69 cm     TR Peak grad:   21.0 mmHg MV Decel Time: 282 msec     TR Vmax:        229.00 cm/s MV E velocity: 88.30 cm/s MV A velocity: 103.00 cm/s  SHUNTS MV E/A ratio:  0.86         Systemic VTI:  0.22 m                             Systemic Diam: 1.80 cm Dani Gobble Croitoru MD Electronically signed by Sanda Klein MD Signature Date/Time: 02/22/2022/4:05:23 PM    Final     Medications: I have reviewed the patient's current medications.   Assessment/Plan: This is a very pleasant 67 years old white female with Stage IV (T2a, N0, M1a) non-small cell lung cancer, adenocarcinoma presented with multifocal bilateral pulmonary nodules involving the right upper lobe as well as the smaller  bilateral groundglass opacities diagnosed in April 2023.  Molecular studies showed positive KRAS G12C mutation and PD-L1 expression of 4%. The patient started systemic chemotherapy with carboplatin, Alimta and Keytruda for 4 cycles and currently on maintenance treatment with Alimta and Keytruda every 3 weeks for additional 4 cycles.  She has been tolerating the treatment well but the recent CT scan of the chest showed concerning findings for disease progression with multiple groundglass opacity suspicious for disease progression. I discussed with the patient briefly consideration of discontinuing her current treatment with maintenance Alimta and Keytruda and consider her for treatment with Alfred Levins (Adagrasib) as a target therapy for the actionable positive KRAS G12C mutation after discharge. For the shortness of breath which is multifactorial including her disease progression as well as COPD and immunotherapy mediated pneumonitis, she will continue her current treatment with high-dose Solu-Medrol.  Her steroid treatment needs to be switched to oral prednisone on discharge to be tapered slowly over the next few weeks.  She will also continue with her inhalers. For the history of DVT she will continue her treatment with Eliquis. I will arrange for the patient a follow-up appointment with me next week for more detailed discussion of her next treatment option. Thank you so much  for taking good care of Sue Chase.  Please call if you have any questions.  LOS: 5 days    Sue Chase 02/23/2022

## 2022-02-23 NOTE — Progress Notes (Signed)
PROGRESS NOTE    Sue Chase  TIW:[id number] DOB: 1955-10-27 DOA: 02/17/2022 PCP: [name], NP   Brief Narrative:   67 y.o. female with medical history significant for stage IV right lung adenocarcinoma on active chemotherapy, COPD, PE/DVT on Eliquis, anemia, ascending aortic dilatation who presented to the ED for evaluation of dyspnea.  CT angiogram did not show pulm embolism.  No clear pneumonia noted either.  Patient was noted to have COPD exacerbation.   She was started on IV steroids.  Assessment & Plan:   COPD exacerbation/ Acute respiratory failure with hypoxia, report not home O2 dependent prior to admission -Apparently recently stopped smoking after 45-pack-year history of smoking. -Currently still on 3 L oxygen via nasal cannula.  Wean off as able. - Continue DuoNebs and inhaled Dulera.,  Taper IV steroid -Might need outpatient pulmonary evaluation and follow-up.   Stage IV adenocarcinoma of right lung -Followed by Dr. Julien Nordmann.  On active chemotherapy with carboplatin, Alimta and Keytruda.  Last dose on December 26. -CTA chest shows slight increased size of spiculated right upper lobe lesion, and increased number of bilateral groundglass nodular lesion suspected to be intraorgan metastases. -Outpatient follow-up with Dr. Julien Nordmann.  He has been added to the care teams on epic  Anemia of chronic disease due to chemotherapy -Hemoglobin 7.1 on 02/18/2022.  Status post 2 units packed red cells transfusion.  Hemoglobin 11.6 today. -Patient complained of black stools. fecal occult blood test negative.  Continue Protonix twice a day orally.  Outpatient follow-up with GI.  Hypokalemia/hypomagnesemia -Improved  CKDII Cr appear close to baseline  History of pulmonary embolism, RLE DVT Continue Eliquis  Body mass index is 18.04 kg/m.   DVT Prophylaxis: On Eliquis Code Status: Full code Family Communication: Discussed with patient Disposition Plan: Hopefully  return home when improved   Status is: Inpatient Remains inpatient appropriate because: COPD exacerbation.  Still requiring supplemental oxygen and IV steroids.   Antimicrobials: Zithromax from 02/17/2022-02/21/2022   Subjective:   Still wheezing, reports no wheezing at baseline, not on oxygen at home, she desires to take a shower today, left leg edema, reports chronic    No fever, vomiting, chest pain reported.   Objective: Vitals:   02/22/22 2118 02/23/22 0459 02/23/22 0907 02/23/22 0938  BP: (!) 174/88 (!) 150/78    Pulse: 92 98    Resp: 16 16    Temp: 98.3 F (36.8 C) (!) 97.4 F (36.3 C)    TempSrc: Oral Oral    SpO2: 96% 99% 96% 96%  Weight:  46.2 kg    Height:       Examination:  General: On 3 L oxygen via nasal.  Looks intermittently anxious.  No distress.  respiratory: Decreased breath sounds at bases bilaterally with some crackles and wheezing CVS: Currently rate controlled; S1-S2 heard  abdominal: Soft, nontender, slightly distended, no organomegaly; normal bowel sounds are heard  extremities: Trace lower extremity edema; left > right , reports chronic ,no clubbing.      Data Reviewed: I have personally reviewed following labs and imaging studies  CBC: Recent Labs  Lab 02/17/22 1935 02/18/22 0712 02/18/22 2215 02/19/22 0744 02/20/22 0758 02/21/22 0851 02/22/22 0728  WBC 13.7* 7.8  --  13.7* 12.8* 9.5 5.2  NEUTROABS 8.5*  --   --   --  10.9* 8.4* 4.3  HGB 9.0* 7.1* 10.0* 10.5* 10.7* 11.4* 11.6*  HCT 27.5* 22.0* 29.7* 31.4* 33.5* 35.3* 36.0  MCV 98.9 101.9*  --  95.7 99.4  99.7 100.3*  PLT 370 227  --  255 260 245 470   Basic Metabolic Panel: Recent Labs  Lab 02/18/22 0712 02/19/22 0744 02/20/22 0758 02/21/22 0851 02/22/22 0728  NA 135 138 140 136 137  K 3.2* 3.6 3.8 3.6 4.2  CL 106 107 108 105 105  CO2 22 21* 25 21* 25  GLUCOSE 167* 137* 102* 165* 130*  BUN 15 22 24* 29* 30*  CREATININE 1.04* 1.08* 1.08* 1.11* 1.03*  CALCIUM 8.9 9.1 9.1  9.0 9.1  MG 1.3* 2.6* 2.0 2.1 2.1   GFR: Estimated Creatinine Clearance: 39.2 mL/min (A) (by C-G formula based on SCr of 1.03 mg/dL (H)). Liver Function Tests: Recent Labs  Lab 02/17/22 1935  AST 23  ALT 11  ALKPHOS 79  BILITOT 0.5  PROT 7.6  ALBUMIN 3.7   No results for input(s): "LIPASE", "AMYLASE" in the last 168 hours. No results for input(s): "AMMONIA" in the last 168 hours. Coagulation Profile: No results for input(s): "INR", "PROTIME" in the last 168 hours. Cardiac Enzymes: No results for input(s): "CKTOTAL", "CKMB", "CKMBINDEX", "TROPONINI" in the last 168 hours. BNP (last 3 results) No results for input(s): "PROBNP" in the last 8760 hours. HbA1C: No results for input(s): "HGBA1C" in the last 72 hours. CBG: No results for input(s): "GLUCAP" in the last 168 hours. Lipid Profile: No results for input(s): "CHOL", "HDL", "LDLCALC", "TRIG", "CHOLHDL", "LDLDIRECT" in the last 72 hours. Thyroid Function Tests: No results for input(s): "TSH", "T4TOTAL", "FREET4", "T3FREE", "THYROIDAB" in the last 72 hours. Anemia Panel: No results for input(s): "VITAMINB12", "FOLATE", "FERRITIN", "TIBC", "IRON", "RETICCTPCT" in the last 72 hours. Sepsis Labs: No results for input(s): "PROCALCITON", "LATICACIDVEN" in the last 168 hours.  Recent Results (from the past 240 hour(s))  Resp panel by RT-PCR (RSV, Flu A&B, Covid) Anterior Nasal Swab     Status: None   Collection Time: 02/17/22  7:12 PM   Specimen: Anterior Nasal Swab  Result Value Ref Range Status   SARS Coronavirus 2 by RT PCR NEGATIVE NEGATIVE Final    Comment: (NOTE) SARS-CoV-2 target nucleic acids are NOT DETECTED.  The SARS-CoV-2 RNA is generally detectable in upper respiratory specimens during the acute phase of infection. The lowest concentration of SARS-CoV-2 viral copies this assay can detect is 138 copies/mL. A negative result does not preclude SARS-Cov-2 infection and should not be used as the sole basis for  treatment or other patient management decisions. A negative result may occur with  improper specimen collection/handling, submission of specimen other than nasopharyngeal swab, presence of viral mutation(s) within the areas targeted by this assay, and inadequate number of viral copies(<138 copies/mL). A negative result must be combined with clinical observations, patient history, and epidemiological information. The expected result is Negative.  Fact Sheet for Patients:  EntrepreneurPulse.com.au  Fact Sheet for Healthcare Providers:  IncredibleEmployment.be  This test is no t yet approved or cleared by the [place] FDA and  has been authorized for detection and/or diagnosis of SARS-CoV-2 by FDA under an Emergency Use Authorization (EUA). This EUA will remain  in effect (meaning this test can be used) for the duration of the COVID-19 declaration under Section 564(b)(1) of the Act, 21 U.S.C.section 360bbb-3(b)(1), unless the authorization is terminated  or revoked sooner.       Influenza A by PCR NEGATIVE NEGATIVE Final   Influenza B by PCR NEGATIVE NEGATIVE Final    Comment: (NOTE) The Xpert Xpress SARS-CoV-2/FLU/RSV plus assay is intended as an aid in the diagnosis  of influenza from Nasopharyngeal swab specimens and should not be used as a sole basis for treatment. Nasal washings and aspirates are unacceptable for Xpert Xpress SARS-CoV-2/FLU/RSV testing.  Fact Sheet for Patients: EntrepreneurPulse.com.au  Fact Sheet for Healthcare Providers: IncredibleEmployment.be  This test is not yet approved or cleared by the [place] FDA and has been authorized for detection and/or diagnosis of SARS-CoV-2 by FDA under an Emergency Use Authorization (EUA). This EUA will remain in effect (meaning this test can be used) for the duration of the COVID-19 declaration under Section 564(b)(1) of the Act, 21  U.S.C. section 360bbb-3(b)(1), unless the authorization is terminated or revoked.     Resp Syncytial Virus by PCR NEGATIVE NEGATIVE Final    Comment: (NOTE) Fact Sheet for Patients: EntrepreneurPulse.com.au  Fact Sheet for Healthcare Providers: IncredibleEmployment.be  This test is not yet approved or cleared by the [place] FDA and has been authorized for detection and/or diagnosis of SARS-CoV-2 by FDA under an Emergency Use Authorization (EUA). This EUA will remain in effect (meaning this test can be used) for the duration of the COVID-19 declaration under Section 564(b)(1) of the Act, 21 U.S.C. section 360bbb-3(b)(1), unless the authorization is terminated or revoked.  Performed at [facility], [place] [address], [place], [place] [address]          Radiology Studies: ECHOCARDIOGRAM COMPLETE  Result Date: 02/22/2022    ECHOCARDIOGRAM REPORT   Patient Name:   [facility] Date of Exam: 02/22/2022 Medical Rec #:  [id number]           Height:       63.0 in Accession #:    [id number]          Weight:       107.8 lb Date of Birth:  02-28-1955            BSA:          1.487 m Patient Age:    25 years            BP:           141/85 mmHg Patient Gender: F                   HR:           89 bpm. Exam Location:  Inpatient Procedure: 2D Echo, Cardiac Doppler and Color Doppler Indications:    R06.00 Dyspnea  History:        Patient has prior history of Echocardiogram examinations, most                 recent 02/19/2021. COPD; Risk Factors:Dyslipidemia and Current                 Smoker. Acute respiratory failure with hypoxia ([place]), Hx of                 Acute pulmonary embolism ([place]).  Sonographer:    Alvino Chapel RCS Referring Phys: [id number] [place]  1. Left ventricular ejection fraction, by estimation, is 60 to 65%. The left ventricle has normal function. The left ventricle has no regional wall motion  abnormalities. Left ventricular diastolic parameters are consistent with Grade I diastolic dysfunction (impaired relaxation).  2. Right ventricular systolic function is normal. The right ventricular size is normal. There is normal pulmonary artery systolic pressure. The estimated right ventricular systolic pressure is 66.0 mmHg.  3. The mitral valve is normal in structure. No evidence of mitral valve regurgitation.  No evidence of mitral stenosis.  4. The aortic valve is normal in structure. Aortic valve regurgitation is not visualized. No aortic stenosis is present.  5. The inferior vena cava is normal in size with greater than 50% respiratory variability, suggesting right atrial pressure of 3 mmHg. Comparison(s): No significant change from prior study. Prior images reviewed side by side. FINDINGS  Left Ventricle: Left ventricular ejection fraction, by estimation, is 60 to 65%. The left ventricle has normal function. The left ventricle has no regional wall motion abnormalities. The left ventricular internal cavity size was normal in size. There is  no left ventricular hypertrophy. Left ventricular diastolic parameters are consistent with Grade I diastolic dysfunction (impaired relaxation). Indeterminate filling pressures. Right Ventricle: The right ventricular size is normal. No increase in right ventricular wall thickness. Right ventricular systolic function is normal. There is normal pulmonary artery systolic pressure. The tricuspid regurgitant velocity is 2.29 m/s, and  with an assumed right atrial pressure of 3 mmHg, the estimated right ventricular systolic pressure is 62.8 mmHg. Left Atrium: Left atrial size was normal in size. Right Atrium: Right atrial size was normal in size. Pericardium: There is no evidence of pericardial effusion. Mitral Valve: The mitral valve is normal in structure. No evidence of mitral valve regurgitation. No evidence of mitral valve stenosis. Tricuspid Valve: The tricuspid valve is  normal in structure. Tricuspid valve regurgitation is not demonstrated. No evidence of tricuspid stenosis. Aortic Valve: The aortic valve is normal in structure. Aortic valve regurgitation is not visualized. No aortic stenosis is present. Pulmonic Valve: The pulmonic valve was normal in structure. Pulmonic valve regurgitation is not visualized. No evidence of pulmonic stenosis. Aorta: The aortic root is normal in size and structure. Venous: The inferior vena cava is normal in size with greater than 50% respiratory variability, suggesting right atrial pressure of 3 mmHg. IAS/Shunts: No atrial level shunt detected by color flow Doppler.  LEFT VENTRICLE PLAX 2D LVIDd:         4.40 cm   Diastology LVIDs:         2.70 cm   LV e' medial:    6.63 cm/s LV PW:         0.90 cm   LV E/e' medial:  13.3 LV IVS:        0.80 cm   LV e' lateral:   8.49 cm/s LVOT diam:     1.80 cm   LV E/e' lateral: 10.4 LV SV:         57 LV SV Index:   38 LVOT Area:     2.54 cm  RIGHT VENTRICLE RV S prime:     18.40 cm/s TAPSE (M-mode): 2.4 cm LEFT ATRIUM             Index        RIGHT ATRIUM           Index LA diam:        3.00 cm 2.02 cm/m   RA Area:     14.70 cm LA Vol (A2C):   41.7 ml 28.04 ml/m  RA Volume:   38.80 ml  26.09 ml/m LA Vol (A4C):   37.9 ml 25.49 ml/m LA Biplane Vol: 40.7 ml 27.37 ml/m  AORTIC VALVE LVOT Vmax:   115.00 cm/s LVOT Vmean:  75.100 cm/s LVOT VTI:    0.224 m  AORTA Ao Root diam: 3.00 cm MITRAL VALVE  TRICUSPID VALVE MV Area (PHT): 2.69 cm     TR Peak grad:   21.0 mmHg MV Decel Time: 282 msec     TR Vmax:        229.00 cm/s MV E velocity: 88.30 cm/s MV A velocity: 103.00 cm/s  SHUNTS MV E/A ratio:  0.86         Systemic VTI:  0.22 m                             Systemic Diam: 1.80 cm Mihai Croitoru MD Electronically signed by Sanda Klein MD Signature Date/Time: 02/22/2022/4:05:23 PM    Final         Scheduled Meds:  albuterol  2.5 mg Nebulization Q6H   apixaban  5 mg Oral BID   furosemide   80 mg Intravenous Once   guaiFENesin  600 mg Oral BID   magnesium oxide  400 mg Oral Daily   melatonin  3 mg Oral QHS   methylPREDNISolone (SOLU-MEDROL) injection  80 mg Intravenous Q8H   mometasone-formoterol  2 puff Inhalation BID   pantoprazole  40 mg Oral BID AC   umeclidinium bromide  1 puff Inhalation Daily   Continuous Infusions:        Florencia Reasons, MD PhD FACP [facility] 02/23/2022, 12:34 PM  '

## 2022-02-23 NOTE — Progress Notes (Signed)
Mobility Specialist - Progress Note  Pre-mobility: 95% SpO2 During mobility: 91% SpO2 Post-mobility: 93% SPO2   02/23/22 1219  Oxygen Therapy  O2 Device Room Air  Patient Activity (if Appropriate) Ambulating  Mobility  Activity Ambulated independently in hallway  Level of Assistance Modified independent, requires aide device or extra time  Assistive Device None  Distance Ambulated (ft) 100 ft  Range of Motion/Exercises Active  Activity Response Tolerated well  Mobility Referral Yes  $Mobility charge 1 Mobility   Pt was found in bed and agreeable to ambulate. Stated feeling fatigued today and SPO2 dropped to 91% during session. At EOS was able to recover SPO2 to 93% within ~ 1 min by sitting EOB and practicing pursed lip breathing. Was left with all necessities in reach and RN notified of session.  Ferd Hibbs Mobility Specialist

## 2022-02-24 ENCOUNTER — Telehealth: Payer: Self-pay | Admitting: Medical Oncology

## 2022-02-24 ENCOUNTER — Other Ambulatory Visit: Payer: Self-pay | Admitting: Medical Oncology

## 2022-02-24 ENCOUNTER — Inpatient Hospital Stay: Payer: Medicare Other

## 2022-02-24 ENCOUNTER — Other Ambulatory Visit: Payer: Medicare Other

## 2022-02-24 DIAGNOSIS — J9601 Acute respiratory failure with hypoxia: Secondary | ICD-10-CM | POA: Diagnosis not present

## 2022-02-24 DIAGNOSIS — C3491 Malignant neoplasm of unspecified part of right bronchus or lung: Secondary | ICD-10-CM

## 2022-02-24 MED ORDER — PREDNISONE 50 MG PO TABS
50.0000 mg | ORAL_TABLET | Freq: Every day | ORAL | Status: DC
  Administered 2022-02-25: 50 mg via ORAL
  Filled 2022-02-24: qty 1

## 2022-02-24 NOTE — Progress Notes (Signed)
PROGRESS NOTE    Sue Chase  MLY:[id number] DOB: [birth year] DOA: 02/17/2022 PCP: [name], NP   Brief Narrative:   67 y.o. female with medical history significant for stage IV right lung adenocarcinoma on active chemotherapy, COPD, PE/DVT on Eliquis, anemia, ascending aortic dilatation who presented to the ED for evaluation of dyspnea.  CT angiogram did not show pulm embolism.  No clear pneumonia noted either.  Patient was noted to have COPD exacerbation.   She was started on IV steroids.  Assessment & Plan:   COPD exacerbation/ Acute respiratory failure with hypoxia, report not on home O2 and independent prior to admission -Apparently recently stopped smoking after 45-pack-year history of smoking. -- Continue DuoNebs and inhaled Dulera.,  Taper IV steroid -Might need outpatient pulmonary evaluation and follow-up.   Stage IV adenocarcinoma of right lung -Followed by Dr. Julien Nordmann.  On active chemotherapy with carboplatin, Alimta and Keytruda.  Last dose on December 26. -CTA chest shows slight increased size of spiculated right upper lobe lesion, and increased number of bilateral groundglass nodular lesion suspected to be intraorgan metastases. - Dr. Julien Nordmann aware  Anemia of chronic disease due to chemotherapy -Hemoglobin 7.1 on 02/18/2022.  Status post 2 units packed red cells transfusion.  Hemoglobin  has been stable post transfusion -Patient complained of black stools. fecal occult blood test negative.  Continue Protonix twice a day orally.  Outpatient follow-up with GI.  Hypokalemia/hypomagnesemia -Improved  CKDII Cr appear close to baseline  History of pulmonary embolism, RLE DVT Continue Eliquis  Body mass index is 18.04 kg/m.   DVT Prophylaxis: On Eliquis Code Status: Full code Family Communication: Discussed with patient Disposition Plan: cleared by PT,  may need home o2, possible home on 1/5 if doing well on steroids taper     Antimicrobials:  Zithromax from 02/17/2022-02/21/2022   Subjective:  Less wheezing, less cough   No fever, vomiting, chest pain reported.    Objective: Vitals:   02/23/22 2047 02/23/22 2142 02/24/22 0412 02/24/22 0808  BP:  (!) 157/83 (!) 157/92   Pulse:  85 85   Resp:  18 20   Temp:  (!) 97.5 F (36.4 C) 97.6 F (36.4 C)   TempSrc:  Oral Oral   SpO2: 96% 97% 98% 95%  Weight:      Height:       Examination:  General: intermittently anxious.  No distress.  respiratory: Decreased breath sounds at bases bilaterally, crackles appear has resolved, less wheezing CVS: Currently rate controlled; S1-S2 heard  abdominal: Soft, nontender,  normal bowel sounds are heard  extremities: Trace lower extremity edema; left > right , reports chronic ,no clubbing.      Data Reviewed: I have personally reviewed following labs and imaging studies  CBC: Recent Labs  Lab 02/17/22 1935 02/18/22 0712 02/18/22 2215 02/19/22 0744 02/20/22 0758 02/21/22 0851 02/22/22 0728  WBC 13.7* 7.8  --  13.7* 12.8* 9.5 5.2  NEUTROABS 8.5*  --   --   --  10.9* 8.4* 4.3  HGB 9.0* 7.1* 10.0* 10.5* 10.7* 11.4* 11.6*  HCT 27.5* 22.0* 29.7* 31.4* 33.5* 35.3* 36.0  MCV 98.9 101.9*  --  95.7 99.4 99.7 100.3*  PLT 370 227  --  255 260 245 812   Basic Metabolic Panel: Recent Labs  Lab 02/18/22 0712 02/19/22 0744 02/20/22 0758 02/21/22 0851 02/22/22 0728  NA 135 138 140 136 137  K 3.2* 3.6 3.8 3.6 4.2  CL 106 107 108 105 105  CO2 22  21* 25 21* 25  GLUCOSE 167* 137* 102* 165* 130*  BUN 15 22 24* 29* 30*  CREATININE 1.04* 1.08* 1.08* 1.11* 1.03*  CALCIUM 8.9 9.1 9.1 9.0 9.1  MG 1.3* 2.6* 2.0 2.1 2.1   GFR: Estimated Creatinine Clearance: 39.2 mL/min (A) (by C-G formula based on SCr of 1.03 mg/dL (H)). Liver Function Tests: Recent Labs  Lab 02/17/22 1935  AST 23  ALT 11  ALKPHOS 79  BILITOT 0.5  PROT 7.6  ALBUMIN 3.7   No results for input(s): "LIPASE", "AMYLASE" in the last 168 hours. No results for  input(s): "AMMONIA" in the last 168 hours. Coagulation Profile: No results for input(s): "INR", "PROTIME" in the last 168 hours. Cardiac Enzymes: No results for input(s): "CKTOTAL", "CKMB", "CKMBINDEX", "TROPONINI" in the last 168 hours. BNP (last 3 results) No results for input(s): "PROBNP" in the last 8760 hours. HbA1C: No results for input(s): "HGBA1C" in the last 72 hours. CBG: No results for input(s): "GLUCAP" in the last 168 hours. Lipid Profile: No results for input(s): "CHOL", "HDL", "LDLCALC", "TRIG", "CHOLHDL", "LDLDIRECT" in the last 72 hours. Thyroid Function Tests: No results for input(s): "TSH", "T4TOTAL", "FREET4", "T3FREE", "THYROIDAB" in the last 72 hours. Anemia Panel: No results for input(s): "VITAMINB12", "FOLATE", "FERRITIN", "TIBC", "IRON", "RETICCTPCT" in the last 72 hours. Sepsis Labs: No results for input(s): "PROCALCITON", "LATICACIDVEN" in the last 168 hours.  Recent Results (from the past 240 hour(s))  Resp panel by RT-PCR (RSV, Flu A&B, Covid) Anterior Nasal Swab     Status: None   Collection Time: 02/17/22  7:12 PM   Specimen: Anterior Nasal Swab  Result Value Ref Range Status   SARS Coronavirus 2 by RT PCR NEGATIVE NEGATIVE Final    Comment: (NOTE) SARS-CoV-2 target nucleic acids are NOT DETECTED.  The SARS-CoV-2 RNA is generally detectable in upper respiratory specimens during the acute phase of infection. The lowest concentration of SARS-CoV-2 viral copies this assay can detect is 138 copies/mL. A negative result does not preclude SARS-Cov-2 infection and should not be used as the sole basis for treatment or other patient management decisions. A negative result may occur with  improper specimen collection/handling, submission of specimen other than nasopharyngeal swab, presence of viral mutation(s) within the areas targeted by this assay, and inadequate number of viral copies(<138 copies/mL). A negative result must be combined with clinical  observations, patient history, and epidemiological information. The expected result is Negative.  Fact Sheet for Patients:  EntrepreneurPulse.com.au  Fact Sheet for Healthcare Providers:  IncredibleEmployment.be  This test is no t yet approved or cleared by the [place] FDA and  has been authorized for detection and/or diagnosis of SARS-CoV-2 by FDA under an Emergency Use Authorization (EUA). This EUA will remain  in effect (meaning this test can be used) for the duration of the COVID-19 declaration under Section 564(b)(1) of the Act, 21 U.S.C.section 360bbb-3(b)(1), unless the authorization is terminated  or revoked sooner.       Influenza A by PCR NEGATIVE NEGATIVE Final   Influenza B by PCR NEGATIVE NEGATIVE Final    Comment: (NOTE) The Xpert Xpress SARS-CoV-2/FLU/RSV plus assay is intended as an aid in the diagnosis of influenza from Nasopharyngeal swab specimens and should not be used as a sole basis for treatment. Nasal washings and aspirates are unacceptable for Xpert Xpress SARS-CoV-2/FLU/RSV testing.  Fact Sheet for Patients: EntrepreneurPulse.com.au  Fact Sheet for Healthcare Providers: IncredibleEmployment.be  This test is not yet approved or cleared by the [place] and  has been authorized for detection and/or diagnosis of SARS-CoV-2 by FDA under an Emergency Use Authorization (EUA). This EUA will remain in effect (meaning this test can be used) for the duration of the COVID-19 declaration under Section 564(b)(1) of the Act, 21 U.S.C. section 360bbb-3(b)(1), unless the authorization is terminated or revoked.     Resp Syncytial Virus by PCR NEGATIVE NEGATIVE Final    Comment: (NOTE) Fact Sheet for Patients: EntrepreneurPulse.com.au  Fact Sheet for Healthcare Providers: IncredibleEmployment.be  This test is not yet approved or cleared by  the [place] FDA and has been authorized for detection and/or diagnosis of SARS-CoV-2 by FDA under an Emergency Use Authorization (EUA). This EUA will remain in effect (meaning this test can be used) for the duration of the COVID-19 declaration under Section 564(b)(1) of the Act, 21 U.S.C. section 360bbb-3(b)(1), unless the authorization is terminated or revoked.  Performed at [facility], [place] [address] [place], [place] [address]          Radiology Studies: ECHOCARDIOGRAM COMPLETE  Result Date: 02/22/2022    ECHOCARDIOGRAM REPORT   Patient Name:   [facility] Date of Exam: 02/22/2022 Medical Rec #:  [id number]           Height:       63.0 in Accession #:    [id number]          Weight:       107.8 lb Date of Birth:  01-14-1956            BSA:          1.487 m Patient Age:    54 years            BP:           141/85 mmHg Patient Gender: F                   HR:           89 bpm. Exam Location:  Inpatient Procedure: 2D Echo, Cardiac Doppler and Color Doppler Indications:    R06.00 Dyspnea  History:        Patient has prior history of Echocardiogram examinations, most                 recent 02/19/2021. COPD; Risk Factors:Dyslipidemia and Current                 Smoker. Acute respiratory failure with hypoxia ([place]), Hx of                 Acute pulmonary embolism ([place]).  Sonographer:    Alvino Chapel RCS Referring Phys: [id number] [place]  1. Left ventricular ejection fraction, by estimation, is 60 to 65%. The left ventricle has normal function. The left ventricle has no regional wall motion abnormalities. Left ventricular diastolic parameters are consistent with Grade I diastolic dysfunction (impaired relaxation).  2. Right ventricular systolic function is normal. The right ventricular size is normal. There is normal pulmonary artery systolic pressure. The estimated right ventricular systolic pressure is 76.7 mmHg.  3. The mitral valve is normal in structure.  No evidence of mitral valve regurgitation. No evidence of mitral stenosis.  4. The aortic valve is normal in structure. Aortic valve regurgitation is not visualized. No aortic stenosis is present.  5. The inferior vena cava is normal in size with greater than 50% respiratory variability, suggesting right atrial pressure of 3 mmHg. Comparison(s): No significant change from prior study. Prior  images reviewed side by side. FINDINGS  Left Ventricle: Left ventricular ejection fraction, by estimation, is 60 to 65%. The left ventricle has normal function. The left ventricle has no regional wall motion abnormalities. The left ventricular internal cavity size was normal in size. There is  no left ventricular hypertrophy. Left ventricular diastolic parameters are consistent with Grade I diastolic dysfunction (impaired relaxation). Indeterminate filling pressures. Right Ventricle: The right ventricular size is normal. No increase in right ventricular wall thickness. Right ventricular systolic function is normal. There is normal pulmonary artery systolic pressure. The tricuspid regurgitant velocity is 2.29 m/s, and  with an assumed right atrial pressure of 3 mmHg, the estimated right ventricular systolic pressure is 65.6 mmHg. Left Atrium: Left atrial size was normal in size. Right Atrium: Right atrial size was normal in size. Pericardium: There is no evidence of pericardial effusion. Mitral Valve: The mitral valve is normal in structure. No evidence of mitral valve regurgitation. No evidence of mitral valve stenosis. Tricuspid Valve: The tricuspid valve is normal in structure. Tricuspid valve regurgitation is not demonstrated. No evidence of tricuspid stenosis. Aortic Valve: The aortic valve is normal in structure. Aortic valve regurgitation is not visualized. No aortic stenosis is present. Pulmonic Valve: The pulmonic valve was normal in structure. Pulmonic valve regurgitation is not visualized. No evidence of pulmonic  stenosis. Aorta: The aortic root is normal in size and structure. Venous: The inferior vena cava is normal in size with greater than 50% respiratory variability, suggesting right atrial pressure of 3 mmHg. IAS/Shunts: No atrial level shunt detected by color flow Doppler.  LEFT VENTRICLE PLAX 2D LVIDd:         4.40 cm   Diastology LVIDs:         2.70 cm   LV e' medial:    6.63 cm/s LV PW:         0.90 cm   LV E/e' medial:  13.3 LV IVS:        0.80 cm   LV e' lateral:   8.49 cm/s LVOT diam:     1.80 cm   LV E/e' lateral: 10.4 LV SV:         57 LV SV Index:   38 LVOT Area:     2.54 cm  RIGHT VENTRICLE RV S prime:     18.40 cm/s TAPSE (M-mode): 2.4 cm LEFT ATRIUM             Index        RIGHT ATRIUM           Index LA diam:        3.00 cm 2.02 cm/m   RA Area:     14.70 cm LA Vol (A2C):   41.7 ml 28.04 ml/m  RA Volume:   38.80 ml  26.09 ml/m LA Vol (A4C):   37.9 ml 25.49 ml/m LA Biplane Vol: 40.7 ml 27.37 ml/m  AORTIC VALVE LVOT Vmax:   115.00 cm/s LVOT Vmean:  75.100 cm/s LVOT VTI:    0.224 m  AORTA Ao Root diam: 3.00 cm MITRAL VALVE                TRICUSPID VALVE MV Area (PHT): 2.69 cm     TR Peak grad:   21.0 mmHg MV Decel Time: 282 msec     TR Vmax:        229.00 cm/s MV E velocity: 88.30 cm/s MV A velocity: 103.00 cm/s  SHUNTS MV E/A ratio:  0.86  Systemic VTI:  0.22 m                             Systemic Diam: 1.80 cm Mihai Croitoru MD Electronically signed by Sanda Klein MD Signature Date/Time: 02/22/2022/4:05:23 PM    Final         Scheduled Meds:  albuterol  2.5 mg Nebulization Q6H   apixaban  5 mg Oral BID   guaiFENesin  600 mg Oral BID   magnesium oxide  400 mg Oral Daily   melatonin  3 mg Oral QHS   methylPREDNISolone (SOLU-MEDROL) injection  80 mg Intravenous Q12H   mometasone-formoterol  2 puff Inhalation BID   pantoprazole  40 mg Oral BID AC   umeclidinium bromide  1 puff Inhalation Daily   Continuous Infusions:        Florencia Reasons, MD PhD FACP [facility] 02/24/2022, 12:09 PM  '

## 2022-02-24 NOTE — Care Management Important Message (Signed)
Important Message  Patient Details IM Letter given. Name: Sue Chase MRN: [id number] Date of Birth: 1955-04-23   Medicare Important Message Given:  Yes     Kerin Salen 02/24/2022, 10:17 AM

## 2022-02-24 NOTE — Telephone Encounter (Signed)
Schedule message sent for appt  with [facility] .on 01/10.

## 2022-02-24 NOTE — Progress Notes (Signed)
Mobility Specialist - Progress Note  Pre-mobility: 94 bpm HR, 93% SpO2 During mobility: 99 bpm HR, 92% SpO2 Post-mobility: 85 bpm HR, 95% SPO2   02/24/22 1038  Oxygen Therapy  O2 Device Nasal Cannula  O2 Flow Rate (L/min) 2 L/min  Patient Activity (if Appropriate) Ambulating  Mobility  Activity Ambulated independently in hallway  Level of Assistance Independent after set-up  Assistive Device None  Distance Ambulated (ft) 232 ft  Range of Motion/Exercises Active  Activity Response Tolerated well  Mobility Referral Yes  $Mobility charge 1 Mobility   Pt was found in bed and agreeable to ambulate. Pt had x5 brief standing rest breaks stating she felt shaky and weak. At EOS had some coughing and returned to bed with all necessities in reach.   Ferd Hibbs Mobility Specialist

## 2022-02-25 DIAGNOSIS — J9601 Acute respiratory failure with hypoxia: Secondary | ICD-10-CM | POA: Diagnosis not present

## 2022-02-25 LAB — BASIC METABOLIC PANEL
Anion gap: 8 (ref 5–15)
BUN: 27 mg/dL — ABNORMAL HIGH (ref 8–23)
CO2: 27 mmol/L (ref 22–32)
Calcium: 8.9 mg/dL (ref 8.9–10.3)
Chloride: 103 mmol/L (ref 98–111)
Creatinine, Ser: 0.91 mg/dL (ref 0.44–1.00)
GFR, Estimated: >60 mL/min (ref >60)
Glucose, Bld: 82 mg/dL (ref 70–99)
Potassium: 3 mmol/L — ABNORMAL LOW (ref 3.5–5.1)
Sodium: 138 mmol/L (ref 135–145)

## 2022-02-25 LAB — CBC WITH DIFFERENTIAL/PLATELET
Abs Immature Granulocytes: 0.05 10*3/uL (ref 0.00–0.07)
Basophils Absolute: 0 10*3/uL (ref 0.0–0.1)
Basophils Relative: 0 %
Eosinophils Absolute: 0 10*3/uL (ref 0.0–0.5)
Eosinophils Relative: 0 %
HCT: 38.3 % (ref 36.0–46.0)
Hemoglobin: 12.6 g/dL (ref 12.0–15.0)
Immature Granulocytes: 1 %
Lymphocytes Relative: 48 %
Lymphs Abs: 2.7 10*3/uL (ref 0.7–4.0)
MCH: 32.2 pg (ref 26.0–34.0)
MCHC: 32.9 g/dL (ref 30.0–36.0)
MCV: 98 fL (ref 80.0–100.0)
Monocytes Absolute: 0.2 10*3/uL (ref 0.1–1.0)
Monocytes Relative: 4 %
Neutro Abs: 2.6 10*3/uL (ref 1.7–7.7)
Neutrophils Relative %: 47 %
Platelets: 142 10*3/uL — ABNORMAL LOW (ref 150–400)
RBC: 3.91 MIL/uL (ref 3.87–5.11)
RDW: 15.6 % — ABNORMAL HIGH (ref 11.5–15.5)
WBC: 5.6 10*3/uL (ref 4.0–10.5)
nRBC: 0 % (ref 0.0–0.2)

## 2022-02-25 LAB — PHOSPHORUS: Phosphorus: 2.4 mg/dL — ABNORMAL LOW (ref 2.5–4.6)

## 2022-02-25 LAB — MAGNESIUM: Magnesium: 1.9 mg/dL (ref 1.7–2.4)

## 2022-02-25 MED ORDER — PREDNISONE 10 MG PO TABS: ORAL_TABLET | ORAL | 0 refills | Status: DC

## 2022-02-25 MED ORDER — ALPRAZOLAM 0.25 MG PO TABS: 0.2500 mg | ORAL_TABLET | Freq: Two times a day (BID) | ORAL | 0 refills | Status: AC | PRN

## 2022-02-25 MED ORDER — POTASSIUM CHLORIDE 20 MEQ PO PACK
40.0000 meq | PACK | ORAL | Status: AC
  Administered 2022-02-25 (×2): 40 meq via ORAL
  Filled 2022-02-25 (×2): qty 2

## 2022-02-25 MED ORDER — PREDNISONE 10 MG PO TABS: 10.0000 mg | ORAL_TABLET | Freq: Every day | ORAL | 0 refills | Status: DC

## 2022-02-25 MED ORDER — POTASSIUM PHOSPHATES 15 MMOLE/5ML IV SOLN
15.0000 mmol | Freq: Once | INTRAVENOUS | Status: AC
  Administered 2022-02-25: 15 mmol via INTRAVENOUS
  Filled 2022-02-25: qty 5

## 2022-02-25 MED ORDER — POTASSIUM CHLORIDE 20 MEQ PO PACK: 40.0000 meq | PACK | Freq: Every day | ORAL | 0 refills | Status: DC

## 2022-02-25 MED ORDER — SODIUM PHOSPHATES 45 MMOLE/15ML IV SOLN: 15.0000 mmol | Freq: Once | INTRAVENOUS | Status: DC

## 2022-02-25 MED ORDER — POTASSIUM CHLORIDE 20 MEQ PO PACK: PACK | ORAL | 0 refills | Status: DC

## 2022-02-25 NOTE — Progress Notes (Signed)
Pt discharged home with daughter in stable condition. Discharge instructions given. Scripts given and sent to pharmacy of choice. No immediate questions or concerns at this time.

## 2022-02-25 NOTE — Discharge Summary (Addendum)
Discharge Summary  Sue Chase CNO:[id number] DOB: 1955-05-24  PCP: [name], NP  Admit date: 02/17/2022 Discharge date: 02/25/2022  30 Day Unplanned Readmission Risk Score    Flowsheet Row ED to Hosp-Admission (Current) from 02/17/2022 in [facility]  30 Day Unplanned Readmission Risk Score (%) 27.86 Filed at 02/25/2022 0801       This score is the patient's risk of an unplanned readmission within 30 days of being discharged (0 -100%). The score is based on dignosis, age, lab data, medications, orders, and past utilization.   Low:  0-14.9   Medium: 15-21.9   High: 22-29.9   Extreme: 30 and above         Time spent: 24mins, more than 50% time spent on coordination of care.   Recommendations for Outpatient Follow-up:  F/u with PCP within a week  for hospital discharge follow up, repeat cbc/bmp at follow up F/u with oncology Dr Julien Nordmann on 1/10  Addendum: patient requested xanax prescription at discharge, xanax 0.25mg  bid prn x7 days provided.    Discharge Diagnoses:  Active Hospital Problems   Diagnosis Date Noted   Acute respiratory failure with hypoxia ([place]) 02/17/2022    Priority: 1.   COPD with acute exacerbation ([place]) 02/17/2022    Priority: 2.   Adenocarcinoma of right lung, stage 4 ([place]) 07/06/2021    Priority: 3.   COPD (chronic obstructive pulmonary disease) ([place]) 02/18/2022   History of pulmonary embolism 02/17/2022   Hypokalemia 11/03/2021   Normocytic anemia 11/03/2021    Resolved Hospital Problems  No resolved problems to display.    Discharge Condition: stable  Diet recommendation: regular diet Filed Weights   02/21/22 0542 02/23/22 0459  Weight: 48.9 kg 46.2 kg    History of present illness: ( per admitting MD Dr Posey Pronto) Chief Complaint: Shortness of breath   HPI: Sue Chase is a 67 y.o. female with medical history significant for stage IV right lung adenocarcinoma on active chemotherapy, COPD, PE/DVT on  Eliquis, anemia, ascending aortic dilatation who presented to the ED for evaluation of dyspnea.   Patient has had some dyspnea related to her COPD and lung cancer recently well-managed with Symbicort and albuterol as needed.  She developed severe dyspnea while at rest around 2 PM on 12/28.  This was not relieved with her rescue inhaler.  She subsequently developed cough productive of yellow sputum.  She has not had any chest pain, nausea, vomiting, abdominal pain.  She does not require supplemental oxygen at baseline.  EMS were called and per ED documentation she was found to have SpO2 low 90s on room air and very tachypneic.  She was brought to the ED for further evaluation.   ED Course  Labs/Imaging on admission: I have personally reviewed following labs and imaging studies.   Initial vitals showed BP 142/77, pulse 107, RR 27, temp 97.9 F, SpO2 100% on room air.   Labs show WBC 13.7, hemoglobin 9.0, platelets 370,000, sodium 138, potassium 3.1, bicarb 24, BUN 14, creatinine 1.04, serum glucose 116, LFTs within normal limits, troponin negative x 2, BNP 85.5.  VBG shows pH 7.32, pCO2 48, pO2 39.   SARS-CoV-2, influenza, RSV PCR negative.   CTA chest negative for evidence of arterial embolus.  Aortic atherosclerosis with increasingly ulcerated soft plaque in the aorta seen.  Stable 4 cm ascending aortic dilatation noted.  Stable prominent hilar and distal lymph nodes, slightly increased size of spiculated lesion in posterior right upper lobe noted.  Increased number of innumerable bilateral groundglass nodular regions suspected to be intra organ metastases noted.  Bronchial wall thickening, mild emphysema, and findings of cachexia and malnutrition noted.  Stable T9 superior endplate anterior wedge compression fracture reported.   Patient was noted to desaturate to mid to high 80s on room air.  Patient was given IV Solu-Medrol 125 mg 1 L LR, DuoNeb, IV ceftriaxone, oral K 40 mEq.  The hospitalist  service was consulted to admit for further evaluation and management    Hospital Course:  Principal Problem:   Acute respiratory failure with hypoxia ([place]) Active Problems:   COPD with acute exacerbation ([place])   Adenocarcinoma of right lung, stage 4 ([facility])   Hypokalemia   Normocytic anemia   History of pulmonary embolism   COPD (chronic obstructive pulmonary disease) ([facility])   Assessment and Plan:   COPD exacerbation/ Acute respiratory failure with hypoxia -Apparently recently stopped smoking after 45-pack-year history of smoking. -- Continue DuoNebs and inhaled Dulera.,  doing well on steroids taper, she is weaned off oxygen supplement, 02 sats ranged from 92-94 on RA ambulating in hallway      Stage IV adenocarcinoma of right lung -Followed by Dr. Julien Nordmann.  On active chemotherapy with carboplatin, Alimta and Keytruda.  Last dose on December 26. -CTA chest shows slight increased size of spiculated right upper lobe lesion, and increased number of bilateral groundglass nodular lesion suspected to be intraorgan metastases. - Dr. Julien Nordmann aware, f/u appointment on 1/10   Anemia of chronic disease due to chemotherapy -Hemoglobin 7.1 on 02/18/2022.  Status post 2 units packed red cells transfusion.  Hemoglobin  has been stable post transfusion, hgb 12.6 at time of discharge -Patient complained of black stools ( was recently started on iron supplement). fecal occult blood test negative.  Continue PPI, f/u with pcp and GI.   Hypokalemia/hypomagnesemia/hypophosphatemia -replaced    CKDII Cr appear close to baseline   History of pulmonary embolism, RLE DVT Continue Eliquis   Body mass index is 18.04 kg/m.    Discharge Exam: BP (!) 155/86 (BP Location: Right Arm)   Pulse 84   Temp 97.9 F (36.6 C) (Oral)   Resp 18   Ht 5\' 3"  (1.6 m) Comment: as documented 6 days ago  Wt 46.2 kg   SpO2 98%   BMI 18.04 kg/m   General: NAD, pleasant  Cardiovascular: RRR Respiratory:  improved aeration     Discharge Instructions     Diet general   Complete by: As directed    Increase activity slowly   Complete by: As directed       Allergies as of 02/25/2022       Reactions   Codeine Itching        Medication List     STOP taking these medications    potassium chloride SA 20 MEQ tablet Commonly known as: KLOR-CON M       TAKE these medications    acetaminophen 500 MG tablet Commonly known as: TYLENOL Take 500 mg by mouth every 6 (six) hours as needed for moderate pain.   albuterol 108 (90 Base) MCG/ACT inhaler Commonly known as: VENTOLIN HFA Inhale 2 puffs into the lungs every 6 (six) hours as needed for wheezing or shortness of breath.   Breztri Aerosphere 160-9-4.8 MCG/ACT Aero Generic drug: Budeson-Glycopyrrol-Formoterol Inhale 2 puffs into the lungs in the morning and at bedtime.   Eliquis 5 MG Tabs tablet Generic drug: apixaban TAKE 1 TABLET BY MOUTH TWICE A DAY  folic acid 1 MG tablet Commonly known as: FOLVITE TAKE 1 TABLET BY MOUTH DAILY   Integra Plus Caps Take 1 capsule by mouth every morning.   lidocaine-prilocaine cream Commonly known as: EMLA Apply to the Port-A-Cath site 30 minutes before chemotherapy What changed:  how much to take how to take this when to take this reasons to take this   magnesium oxide 400 (240 Mg) MG tablet Commonly known as: MAG-OX Take 400 mg by mouth daily.   nicotine 14 mg/24hr patch Commonly known as: NICODERM CQ - dosed in mg/24 hours 14 mg daily as needed (nicotene depedence).   omeprazole 20 MG capsule Commonly known as: PRILOSEC Take 1 capsule (20 mg total) by mouth daily.   potassium chloride 20 MEQ packet Commonly known as: KLOR-CON Take 40 mEq by mouth daily.   predniSONE 10 MG tablet Commonly known as: DELTASONE Label  & dispense according to the schedule below.  Please take prednisone with breakfast  5 Pills PO on day one, 4 Pills PO on day two, 3Pills PO on day  three, 2 Pills PO on day four, 1 Pills PO on day five,  then STOP.  Total of 15 tabs   prochlorperazine 10 MG tablet Commonly known as: COMPAZINE Take 1 tablet (10 mg total) by mouth every 6 (six) hours as needed for nausea or vomiting.   Symbicort 160-4.5 MCG/ACT inhaler Generic drug: budesonide-formoterol Inhale 2 puffs into the lungs in the morning and at bedtime.               Durable Medical Equipment  (From admission, onward)           Start     Ordered   02/24/22 1213  For home use only DME 4 wheeled rolling walker with seat  Once       Question:  Patient needs a walker to treat with the following condition  Answer:  SOB (shortness of breath)   02/24/22 1212           Allergies  Allergen Reactions   Codeine Itching    Follow-up Information     Nafziger, Tommi Rumps, NP Follow up in 2 week(s).   Specialty: Family Medicine Why: hospital discharge follow up pcp to refer to GI if concern for blood in stool Contact information: [address] [place] [address] [phone]                  The results of significant diagnostics from this hospitalization (including imaging, microbiology, ancillary and laboratory) are listed below for reference.    Significant Diagnostic Studies: ECHOCARDIOGRAM COMPLETE  Result Date: 02/22/2022    ECHOCARDIOGRAM REPORT   Patient Name:   [facility] Date of Exam: 02/22/2022 Medical Rec #:  [id number]           Height:       63.0 in Accession #:    [id number]          Weight:       107.8 lb Date of Birth:  May 05, 1955            BSA:          1.487 m Patient Age:    67 years            BP:           141/85 mmHg Patient Gender: F                   HR:  89 bpm. Exam Location:  Inpatient Procedure: 2D Echo, Cardiac Doppler and Color Doppler Indications:    R06.00 Dyspnea  History:        Patient has prior history of Echocardiogram examinations, most                 recent 02/19/2021. COPD; Risk  Factors:Dyslipidemia and Current                 Smoker. Acute respiratory failure with hypoxia ([place]), Hx of                 Acute pulmonary embolism ([place]).  Sonographer:    Alvino Chapel RCS Referring Phys: [id number] [place]  1. Left ventricular ejection fraction, by estimation, is 60 to 65%. The left ventricle has normal function. The left ventricle has no regional wall motion abnormalities. Left ventricular diastolic parameters are consistent with Grade I diastolic dysfunction (impaired relaxation).  2. Right ventricular systolic function is normal. The right ventricular size is normal. There is normal pulmonary artery systolic pressure. The estimated right ventricular systolic pressure is 38.4 mmHg.  3. The mitral valve is normal in structure. No evidence of mitral valve regurgitation. No evidence of mitral stenosis.  4. The aortic valve is normal in structure. Aortic valve regurgitation is not visualized. No aortic stenosis is present.  5. The inferior vena cava is normal in size with greater than 50% respiratory variability, suggesting right atrial pressure of 3 mmHg. Comparison(s): No significant change from prior study. Prior images reviewed side by side. FINDINGS  Left Ventricle: Left ventricular ejection fraction, by estimation, is 60 to 65%. The left ventricle has normal function. The left ventricle has no regional wall motion abnormalities. The left ventricular internal cavity size was normal in size. There is  no left ventricular hypertrophy. Left ventricular diastolic parameters are consistent with Grade I diastolic dysfunction (impaired relaxation). Indeterminate filling pressures. Right Ventricle: The right ventricular size is normal. No increase in right ventricular wall thickness. Right ventricular systolic function is normal. There is normal pulmonary artery systolic pressure. The tricuspid regurgitant velocity is 2.29 m/s, and  with an assumed right atrial pressure of 3 mmHg, the  estimated right ventricular systolic pressure is 66.5 mmHg. Left Atrium: Left atrial size was normal in size. Right Atrium: Right atrial size was normal in size. Pericardium: There is no evidence of pericardial effusion. Mitral Valve: The mitral valve is normal in structure. No evidence of mitral valve regurgitation. No evidence of mitral valve stenosis. Tricuspid Valve: The tricuspid valve is normal in structure. Tricuspid valve regurgitation is not demonstrated. No evidence of tricuspid stenosis. Aortic Valve: The aortic valve is normal in structure. Aortic valve regurgitation is not visualized. No aortic stenosis is present. Pulmonic Valve: The pulmonic valve was normal in structure. Pulmonic valve regurgitation is not visualized. No evidence of pulmonic stenosis. Aorta: The aortic root is normal in size and structure. Venous: The inferior vena cava is normal in size with greater than 50% respiratory variability, suggesting right atrial pressure of 3 mmHg. IAS/Shunts: No atrial level shunt detected by color flow Doppler.  LEFT VENTRICLE PLAX 2D LVIDd:         4.40 cm   Diastology LVIDs:         2.70 cm   LV e' medial:    6.63 cm/s LV PW:         0.90 cm   LV E/e' medial:  13.3 LV IVS:  0.80 cm   LV e' lateral:   8.49 cm/s LVOT diam:     1.80 cm   LV E/e' lateral: 10.4 LV SV:         57 LV SV Index:   38 LVOT Area:     2.54 cm  RIGHT VENTRICLE RV S prime:     18.40 cm/s TAPSE (M-mode): 2.4 cm LEFT ATRIUM             Index        RIGHT ATRIUM           Index LA diam:        3.00 cm 2.02 cm/m   RA Area:     14.70 cm LA Vol (A2C):   41.7 ml 28.04 ml/m  RA Volume:   38.80 ml  26.09 ml/m LA Vol (A4C):   37.9 ml 25.49 ml/m LA Biplane Vol: 40.7 ml 27.37 ml/m  AORTIC VALVE LVOT Vmax:   115.00 cm/s LVOT Vmean:  75.100 cm/s LVOT VTI:    0.224 m  AORTA Ao Root diam: 3.00 cm MITRAL VALVE                TRICUSPID VALVE MV Area (PHT): 2.69 cm     TR Peak grad:   21.0 mmHg MV Decel Time: 282 msec     TR Vmax:         229.00 cm/s MV E velocity: 88.30 cm/s MV A velocity: 103.00 cm/s  SHUNTS MV E/A ratio:  0.86         Systemic VTI:  0.22 m                             Systemic Diam: 1.80 cm Dani Gobble Croitoru MD Electronically signed by Sanda Klein MD Signature Date/Time: 02/22/2022/4:05:23 PM    Final    CT Angio Chest PE W and/or Wo Contrast  Result Date: 02/17/2022 CLINICAL DATA:  Cancer patient with worsening shortness of breath. Prior history of pulmonary embolism. Weakness and fatigue onset today. EXAM: CT ANGIOGRAPHY CHEST WITH CONTRAST TECHNIQUE: Multidetector CT imaging of the chest was performed using the standard protocol during bolus administration of intravenous contrast. Multiplanar CT image reconstructions and MIPs were obtained to evaluate the vascular anatomy. RADIATION DOSE REDUCTION: This exam was performed according to the departmental dose-optimization program which includes automated exposure control, adjustment of the mA and/or kV according to patient size and/or use of iterative reconstruction technique. CONTRAST:  61mL OMNIPAQUE IOHEXOL 350 MG/ML SOLN COMPARISON:  PET-CT 06/25/2021, CT chest with contrast more recently 12/30/2021. FINDINGS: Cardiovascular: Right IJ port catheter terminates in the distal SVC. The cardiac size is normal. Scattered single-vessel coronary artery calcification noted in the LAD. There is again a small pericardial effusion anteriorly. Pulmonary arteries and veins are normal caliber no arterial embolism is seen. There is aortic atherosclerosis, primarily in the arch where there is mixed soft and calcific plaque again noted with increasingly ulcerative soft plaque along the anterior inferior wall of the arch. There is scattered calcification in the great vessels. There is no aortic or great vessel dissection or stenosis. Slight dilatation again noted in the ascending aorta to 4 cm, stable. Remainder within normal caliber limits. Mediastinum/Nodes: Borderline prominent hilar lymph  nodes are again noted up to 9 mm in short axis with a few slightly prominent right paratracheal nodes up to 1 cm in short axis. No other adenopathy is seen. No abnormality is seen  thoracic trachea, thoracic esophagus and thyroid. Lungs/Pleura: Mixed attenuation semisolid lesion abutting the fissure in the posterior segment of the right upper lobe is stable to slightly increased in size, current measurements of 3.4 x 2.1 cm on 6:46, at a similar level previously having measured 3.2 x 2 cm. Associated architectural distortion is similar, with the adjacent fissure invaginated towards the lesion as before. Spiculations surrounding this lesion appear similar. There are innumerable bilateral nodular ground-glass lesions suspected to be intraorgan metastases. There is a more solid nodule abutting the fissure in the anterior basal segment of the right upper lobe, on 6:105 today measuring 7.3 mm, previously 6.3 mm. Additional areas of ground-glass subsolid disease include a lesion measuring 2 cm in the right lower lobe on 6:74, unchanged, 1.4 cm right lower lobe ground-glass lesion anteriorly on 6:81 also unchanged, and a left lower lobe 2 cm ground-glass lesion on 6:109 which is also unchanged. Innumerable much smaller ground-glass lesions throughout both lungs appear to have increased in number. Diffuse bronchial thickening is noted and mild emphysematous disease with additional scar-like opacities in the bases. There is no pleural effusion.  No airspace consolidation is seen. Upper Abdomen: No acute findings. Musculoskeletal: Mild-to-moderate superior endplate anterior wedge compression fracture of T9 is unchanged. There is osteopenia and mild degenerative change of the spine. No destructive bone lesion is seen. There is progressive thinning of the body wall fat, mild body wall anasarca, findings concerning for cachexia and malnutrition. Review of the MIP images confirms the above findings. IMPRESSION: 1. No evidence of  arterial embolus. 2. Aortic atherosclerosis with increasingly ulcerative soft plaque in the arch. 3. Stable 4 cm ascending aortic dilatation. 4. Stable borderline prominent hilar and mediastinal lymph nodes. 5. Stable to slightly increased size of mixed attenuation semisolid spiculated lesion in the posterior segment of the right upper lobe. 6. Innumerable bilateral ground-glass nodular lesions suspected to be intraorgan metastases, and appear to have increased in number. 7. Diffuse bronchial thickening and mild emphysema. 8. Progressive thinning of the body wall fat, mild body wall anasarca, findings concerning for cachexia and malnutrition. 9. Stable T9 superior endplate anterior wedge compression fracture. Aortic Atherosclerosis (ICD10-I70.0) and Emphysema (ICD10-J43.9). Electronically Signed   By: Telford Nab M.D.   On: 02/17/2022 22:02   DG Chest 2 View  Result Date: 02/17/2022 CLINICAL DATA:  Lung cancer, dyspnea EXAM: CHEST - 2 VIEW COMPARISON:  06/08/2021 FINDINGS: Limb defined left upper lobe opacity has decreased in size since prior examination possibly related to interval response to therapy. The lungs are symmetrically hyperinflated. No new focal pulmonary nodules or infiltrates. No pneumothorax or pleural effusion. Cardiac size within normal limits. Right internal jugular chest port tip noted within the superior vena cava. Pulmonary vascularity is normal. No acute bone abnormality. IMPRESSION: 1. No radiographic evidence of acute cardiopulmonary disease. 2. COPD 3. Interval decrease in size of left upper lobe opacity, possibly related to interval response to therapy. This could be better assessed with repeat CT imaging if indicated. Electronically Signed   By: Fidela Salisbury M.D.   On: 02/17/2022 19:52    Microbiology: Recent Results (from the past 240 hour(s))  Resp panel by RT-PCR (RSV, Flu A&B, Covid) Anterior Nasal Swab     Status: None   Collection Time: 02/17/22  7:12 PM   Specimen:  Anterior Nasal Swab  Result Value Ref Range Status   SARS Coronavirus 2 by RT PCR NEGATIVE NEGATIVE Final    Comment: (NOTE) SARS-CoV-2 target nucleic acids are NOT  DETECTED.  The SARS-CoV-2 RNA is generally detectable in upper respiratory specimens during the acute phase of infection. The lowest concentration of SARS-CoV-2 viral copies this assay can detect is 138 copies/mL. A negative result does not preclude SARS-Cov-2 infection and should not be used as the sole basis for treatment or other patient management decisions. A negative result may occur with  improper specimen collection/handling, submission of specimen other than nasopharyngeal swab, presence of viral mutation(s) within the areas targeted by this assay, and inadequate number of viral copies(<138 copies/mL). A negative result must be combined with clinical observations, patient history, and epidemiological information. The expected result is Negative.  Fact Sheet for Patients:  EntrepreneurPulse.com.au  Fact Sheet for Healthcare Providers:  IncredibleEmployment.be  This test is no t yet approved or cleared by the [place] FDA and  has been authorized for detection and/or diagnosis of SARS-CoV-2 by FDA under an Emergency Use Authorization (EUA). This EUA will remain  in effect (meaning this test can be used) for the duration of the COVID-19 declaration under Section 564(b)(1) of the Act, 21 U.S.C.section 360bbb-3(b)(1), unless the authorization is terminated  or revoked sooner.       Influenza A by PCR NEGATIVE NEGATIVE Final   Influenza B by PCR NEGATIVE NEGATIVE Final    Comment: (NOTE) The Xpert Xpress SARS-CoV-2/FLU/RSV plus assay is intended as an aid in the diagnosis of influenza from Nasopharyngeal swab specimens and should not be used as a sole basis for treatment. Nasal washings and aspirates are unacceptable for Xpert Xpress SARS-CoV-2/FLU/RSV testing.  Fact  Sheet for Patients: EntrepreneurPulse.com.au  Fact Sheet for Healthcare Providers: IncredibleEmployment.be  This test is not yet approved or cleared by the [place] FDA and has been authorized for detection and/or diagnosis of SARS-CoV-2 by FDA under an Emergency Use Authorization (EUA). This EUA will remain in effect (meaning this test can be used) for the duration of the COVID-19 declaration under Section 564(b)(1) of the Act, 21 U.S.C. section 360bbb-3(b)(1), unless the authorization is terminated or revoked.     Resp Syncytial Virus by PCR NEGATIVE NEGATIVE Final    Comment: (NOTE) Fact Sheet for Patients: EntrepreneurPulse.com.au  Fact Sheet for Healthcare Providers: IncredibleEmployment.be  This test is not yet approved or cleared by the [place] FDA and has been authorized for detection and/or diagnosis of SARS-CoV-2 by FDA under an Emergency Use Authorization (EUA). This EUA will remain in effect (meaning this test can be used) for the duration of the COVID-19 declaration under Section 564(b)(1) of the Act, 21 U.S.C. section 360bbb-3(b)(1), unless the authorization is terminated or revoked.  Performed at [facility], [place] [address], [place], [place] [address]      Labs: Basic Metabolic Panel: Recent Labs  Lab 02/19/22 0744 02/20/22 0758 02/21/22 0851 02/22/22 0728 02/25/22 0638  NA 138 140 136 137 138  K 3.6 3.8 3.6 4.2 3.0*  CL 107 108 105 105 103  CO2 21* 25 21* 25 27  GLUCOSE 137* 102* 165* 130* 82  BUN 22 24* 29* 30* 27*  CREATININE 1.08* 1.08* 1.11* 1.03* 0.91  CALCIUM 9.1 9.1 9.0 9.1 8.9  MG 2.6* 2.0 2.1 2.1 1.9  PHOS  --   --   --   --  2.4*   Liver Function Tests: No results for input(s): "AST", "ALT", "ALKPHOS", "BILITOT", "PROT", "ALBUMIN" in the last 168 hours. No results for input(s): "LIPASE", "AMYLASE" in the last 168 hours. No results  for input(s): "AMMONIA" in the last  168 hours. CBC: Recent Labs  Lab 02/19/22 0744 02/20/22 0758 02/21/22 0851 02/22/22 0728 02/25/22 0638  WBC 13.7* 12.8* 9.5 5.2 5.6  NEUTROABS  --  10.9* 8.4* 4.3 2.6  HGB 10.5* 10.7* 11.4* 11.6* 12.6  HCT 31.4* 33.5* 35.3* 36.0 38.3  MCV 95.7 99.4 99.7 100.3* 98.0  PLT 255 260 245 198 142*   Cardiac Enzymes: No results for input(s): "CKTOTAL", "CKMB", "CKMBINDEX", "TROPONINI" in the last 168 hours. BNP: BNP (last 3 results) Recent Labs    02/17/22 1935  BNP 85.5    ProBNP (last 3 results) No results for input(s): "PROBNP" in the last 8760 hours.  CBG: No results for input(s): "GLUCAP" in the last 168 hours.  FURTHER DISCHARGE INSTRUCTIONS:   Get Medicines reviewed and adjusted: Please take all your medications with you for your next visit with your Primary MD   Laboratory/radiological data: Please request your Primary MD to go over all hospital tests and procedure/radiological results at the follow up, please ask your Primary MD to get all Hospital records sent to his/her office.   In some cases, they will be blood work, cultures and biopsy results pending at the time of your discharge. Please request that your primary care M.D. goes through all the records of your hospital data and follows up on these results.   Also Note the following: If you experience worsening of your admission symptoms, develop shortness of breath, life threatening emergency, suicidal or homicidal thoughts you must seek medical attention immediately by calling 911 or calling your MD immediately  if symptoms less severe.   You must read complete instructions/literature along with all the possible adverse reactions/side effects for all the Medicines you take and that have been prescribed to you. Take any new Medicines after you have completely understood and accpet all the possible adverse reactions/side effects.    Do not drive when taking Pain medications or  sleeping medications (Benzodaizepines)   Do not take more than prescribed Pain, Sleep and Anxiety Medications. It is not advisable to combine anxiety,sleep and pain medications without talking with your primary care practitioner   Special Instructions: If you have smoked or chewed Tobacco  in the last 2 yrs please stop smoking, stop any regular Alcohol  and or any Recreational drug use.   Wear Seat belts while driving.   Please note: You were cared for by a hospitalist during your hospital stay. Once you are discharged, your primary care physician will handle any further medical issues. Please note that NO REFILLS for any discharge medications will be authorized once you are discharged, as it is imperative that you return to your primary care physician (or establish a relationship with a primary care physician if you do not have one) for your post hospital discharge needs so that they can reassess your need for medications and monitor your lab values.     Signed:  Florencia Reasons MD, PhD, FACP  [facility] 02/25/2022, 12:47 PM

## 2022-02-25 NOTE — Plan of Care (Signed)

## 2022-02-25 NOTE — TOC Progression Note (Signed)
Transition of Care [facility]) - Progression Note    Patient Details  Name: Sue Chase MRN: [id number] Date of Birth: [birth year]  Transition of Care [facility]) CM/SW [place], RN Phone Number:[phone]  02/25/2022, 10:39 AM  Clinical Narrative:    Per MD patient is discharging today. Rollator order has been called in to [facility]. Rollator to be delivered to patients room prior to discharge.    Expected Discharge Plan: Home/Self Care Barriers to Discharge: Continued Medical Work up  Expected Discharge Plan and Services   Discharge Planning Services: CM Consult   Living arrangements for the past 2 months: Single Family Home                                       Social Determinants of Health (SDOH) Interventions SDOH Screenings   Food Insecurity: No Food Insecurity (02/18/2022)  Housing: Low Risk  (02/18/2022)  Transportation Needs: No Transportation Needs (02/18/2022)  Utilities: Not At Risk (02/18/2022)  Depression (PHQ2-9): Low Risk  (11/11/2021)  Financial Resource Strain: Medium Risk (10/18/2021)  Physical Activity: Unknown (10/18/2021)  Social Connections: Unknown (10/18/2021)  Stress: No Stress Concern Present (10/18/2021)  Tobacco Use: High Risk (02/17/2022)    Readmission Risk Interventions    02/18/2022    2:37 PM  Readmission Risk Prevention Plan  Transportation Screening Complete  PCP or Specialist Appt within 3-5 Days Complete  HRI or [place] Complete  Social Work Consult for [place] Planning/Counseling Complete  Palliative Care Screening Not Applicable

## 2022-02-28 ENCOUNTER — Encounter: Payer: Self-pay | Admitting: *Deleted

## 2022-02-28 ENCOUNTER — Telehealth: Payer: Self-pay | Admitting: *Deleted

## 2022-02-28 NOTE — Patient Outreach (Signed)
Care Coordination [facility] Note Transition Care Management Follow-up Telephone Call Date of discharge and from where: Friday 02/25/22; [facility]; Acute Respiratory Failure/ shortness of breath in setting of Stage IV lung CA How have you been since you were released from the hospital? "I am doing better than I was.  My daughter from out of town is helping me with anything I need, but overall, I am able to do everything I need to on my own.  My daughter that is local is out of town and she returns home on Wednesday, so she will pick up where my other daughter leaves off.  I am a bit confused about all of these inhalers on my medication list, but I am going to talk to Dr. Julien Nordmann about all of this when I see him on Wednesday of this week.  I got my rolling walker and am using it as needed" Any questions or concerns? Yes 1) questions about current/ updated inhaler use-- states she does not use all of the inhalers listed on her post-hospital discharge medication list: confirmed she does have maintenance (Symbicort) and rescue (Albuterol) inhalers and is using both as ordered/ instructed; verbalizes proactive plans to discuss with oncology provider at Wednesday 03/02/22 for clarification and this was encouraged; provided general education/ reinforcement around difference between rescue and maintenance inhalers 2) questions about oral iron- notes she is on 2 different types of iron and is not sure she needs to be on both: verbalized proactive plans to discuss with oncology provider for clarification at time of Wednesday 03/02/22 appointment and this was encouraged  Items Reviewed: Did the pt receive and understand the discharge instructions provided? Yes  Medications obtained and verified? Yes  full medication review completed; several questions noted about current orders for inhalers- patient verbalized proactive plans to get her questions answered at time of upcoming oncology provider appointment this week;  confirmed patient self-manages medications; no urgent questions/ discrepancies identified during [facility] phone call today; medication list in EHR updated according to patient self- report Other? No  Any new allergies since your discharge? No  Dietary orders reviewed? Yes Do you have support at home? Yes  daughters assists with care needs as needed/ indicated; patient reports she is essentially independent in self-care  Home Care and Equipment/Supplies: Were home health services ordered? no If so, what is the name of the agency? N/A  Has the agency set up a time to come to the patient's home? not applicable Were any new equipment or medical supplies ordered?  Yes: rolling walker What is the name of the medical supply agency? [employer] Were you able to get the supplies/equipment? yes Do you have any questions related to the use of the equipment or supplies? No  Functional Questionnaire: (I = Independent and D = Dependent) ADLs: I  daughters assists with care needs as needed  Bathing/Dressing- I  daughters assists with care needs as needed  Meal Prep- I  daughters assists with care needs as needed  Eating- I  Maintaining continence- I  Transferring/Ambulation- I  Managing Meds- I  Follow up appointments reviewed:  PCP Hospital f/u appt confirmed? No  Scheduled to see - on - @ [facility] f/u appt confirmed? Yes  Scheduled to see oncology provider, Dr. Julien Nordmann on Wednesday 03/02/22 @ 2:00 pm Are transportation arrangements needed? No  If their condition worsens, is the pt aware to call PCP or go to the Emergency Dept.? yes Was the patient provided with contact information for the  PCP's office or ED? No- patient declined; reports already has contact information for all care providers Was to pt encouraged to call back with questions or concerns? Yes- provided my direct contact information should questions or concerns arise in the future  SDOH assessments and interventions completed:    Yes SDOH Interventions Today    Flowsheet Row Most Recent Value  SDOH Interventions   Food Insecurity Interventions Intervention Not Indicated  Transportation Interventions Intervention Not Indicated  [drives self,  daughters assist as indicated]      Care Coordination Interventions:  Provided education around general use of inhalers; full medication review completed with updating of medication list in EHR; provided coaching around conversation to have with oncology provider this week regarding clarification of plan of care/ medications and encouraged patient to make list to have for upcoming provider office visit     Encounter Outcome:  Pt. Visit Completed    Oneta Rack, RN, BSN, CCRN Alumnus RN CM Care Coordination/ Transition of [place] Management [phone]: direct office

## 2022-03-01 ENCOUNTER — Other Ambulatory Visit: Payer: Medicare Other

## 2022-03-02 ENCOUNTER — Telehealth: Payer: Self-pay | Admitting: Pulmonary Disease

## 2022-03-02 ENCOUNTER — Telehealth: Payer: Self-pay | Admitting: Pharmacy Technician

## 2022-03-02 ENCOUNTER — Other Ambulatory Visit: Payer: Self-pay | Admitting: Internal Medicine

## 2022-03-02 ENCOUNTER — Inpatient Hospital Stay: Payer: Medicare Other | Attending: Oncology

## 2022-03-02 ENCOUNTER — Other Ambulatory Visit (HOSPITAL_COMMUNITY): Payer: Self-pay

## 2022-03-02 ENCOUNTER — Inpatient Hospital Stay (HOSPITAL_BASED_OUTPATIENT_CLINIC_OR_DEPARTMENT_OTHER): Payer: Medicare Other | Admitting: Pharmacist

## 2022-03-02 ENCOUNTER — Encounter: Payer: Self-pay | Admitting: Internal Medicine

## 2022-03-02 ENCOUNTER — Inpatient Hospital Stay (HOSPITAL_BASED_OUTPATIENT_CLINIC_OR_DEPARTMENT_OTHER): Payer: Medicare Other | Admitting: Internal Medicine

## 2022-03-02 VITALS — BP 144/76 | HR 78 | Temp 97.7°F | Resp 16 | Wt 100.1 lb

## 2022-03-02 DIAGNOSIS — I700 Atherosclerosis of aorta: Secondary | ICD-10-CM | POA: Insufficient documentation

## 2022-03-02 DIAGNOSIS — C3491 Malignant neoplasm of unspecified part of right bronchus or lung: Secondary | ICD-10-CM | POA: Diagnosis not present

## 2022-03-02 DIAGNOSIS — C3411 Malignant neoplasm of upper lobe, right bronchus or lung: Secondary | ICD-10-CM | POA: Diagnosis not present

## 2022-03-02 DIAGNOSIS — D649 Anemia, unspecified: Secondary | ICD-10-CM

## 2022-03-02 DIAGNOSIS — Z95828 Presence of other vascular implants and grafts: Secondary | ICD-10-CM

## 2022-03-02 DIAGNOSIS — Z79899 Other long term (current) drug therapy: Secondary | ICD-10-CM | POA: Insufficient documentation

## 2022-03-02 DIAGNOSIS — E876 Hypokalemia: Secondary | ICD-10-CM

## 2022-03-02 DIAGNOSIS — J439 Emphysema, unspecified: Secondary | ICD-10-CM | POA: Diagnosis not present

## 2022-03-02 LAB — CBC WITH DIFFERENTIAL (CANCER CENTER ONLY)
Abs Immature Granulocytes: 0.12 10*3/uL — ABNORMAL HIGH (ref 0.00–0.07)
Basophils Absolute: 0 10*3/uL (ref 0.0–0.1)
Basophils Relative: 0 %
Eosinophils Absolute: 0 10*3/uL (ref 0.0–0.5)
Eosinophils Relative: 0 %
HCT: 32.1 % — ABNORMAL LOW (ref 36.0–46.0)
Hemoglobin: 11 g/dL — ABNORMAL LOW (ref 12.0–15.0)
Immature Granulocytes: 1 %
Lymphocytes Relative: 18 %
Lymphs Abs: 2.2 10*3/uL (ref 0.7–4.0)
MCH: 32.3 pg (ref 26.0–34.0)
MCHC: 34.3 g/dL (ref 30.0–36.0)
MCV: 94.1 fL (ref 80.0–100.0)
Monocytes Absolute: 1.8 10*3/uL — ABNORMAL HIGH (ref 0.1–1.0)
Monocytes Relative: 15 %
Neutro Abs: 8.2 10*3/uL — ABNORMAL HIGH (ref 1.7–7.7)
Neutrophils Relative %: 66 %
Platelet Count: 109 10*3/uL — ABNORMAL LOW (ref 150–400)
RBC: 3.41 MIL/uL — ABNORMAL LOW (ref 3.87–5.11)
RDW: 15.6 % — ABNORMAL HIGH (ref 11.5–15.5)
WBC Count: 12.4 10*3/uL — ABNORMAL HIGH (ref 4.0–10.5)
nRBC: 0 % (ref 0.0–0.2)

## 2022-03-02 LAB — CMP (CANCER CENTER ONLY)
ALT: 20 U/L (ref 0–44)
AST: 24 U/L (ref 15–41)
Albumin: 3.5 g/dL (ref 3.5–5.0)
Alkaline Phosphatase: 64 U/L (ref 38–126)
Anion gap: 7 (ref 5–15)
BUN: 22 mg/dL (ref 8–23)
CO2: 28 mmol/L (ref 22–32)
Calcium: 8.5 mg/dL — ABNORMAL LOW (ref 8.9–10.3)
Chloride: 104 mmol/L (ref 98–111)
Creatinine: 1.06 mg/dL — ABNORMAL HIGH (ref 0.44–1.00)
GFR, Estimated: 58 mL/min — ABNORMAL LOW (ref >60)
Glucose, Bld: 99 mg/dL (ref 70–99)
Potassium: 3 mmol/L — ABNORMAL LOW (ref 3.5–5.1)
Sodium: 139 mmol/L (ref 135–145)
Total Bilirubin: 0.4 mg/dL (ref 0.3–1.2)
Total Protein: 6.8 g/dL (ref 6.5–8.1)

## 2022-03-02 LAB — FERRITIN: Ferritin: 936 ng/mL — ABNORMAL HIGH (ref 11–307)

## 2022-03-02 LAB — IRON AND IRON BINDING CAPACITY (CC-WL,HP ONLY)
Iron: 227 ug/dL — ABNORMAL HIGH (ref 28–170)
Saturation Ratios: 73 % — ABNORMAL HIGH (ref 10.4–31.8)
TIBC: 311 ug/dL (ref 250–450)
UIBC: 84 ug/dL

## 2022-03-02 LAB — SAMPLE TO BLOOD BANK

## 2022-03-02 LAB — MAGNESIUM: Magnesium: 1.6 mg/dL — ABNORMAL LOW (ref 1.7–2.4)

## 2022-03-02 MED ORDER — HEPARIN SOD (PORK) LOCK FLUSH 100 UNIT/ML IV SOLN
500.0000 [IU] | Freq: Once | INTRAVENOUS | Status: AC
  Administered 2022-03-02: 500 [IU]

## 2022-03-02 MED ORDER — SODIUM CHLORIDE 0.9% FLUSH
10.0000 mL | Freq: Once | INTRAVENOUS | Status: AC
  Administered 2022-03-02: 10 mL

## 2022-03-02 MED ORDER — POTASSIUM CHLORIDE 20 MEQ PO PACK: 40.0000 meq | PACK | Freq: Every day | ORAL | 0 refills | Status: DC

## 2022-03-02 MED ORDER — APIXABAN 2.5 MG PO TABS: 2.5000 mg | ORAL_TABLET | Freq: Two times a day (BID) | ORAL | 2 refills | Status: DC

## 2022-03-02 MED ORDER — KRAZATI 200 MG PO TABS
600.0000 mg | ORAL_TABLET | Freq: Two times a day (BID) | ORAL | 3 refills | Status: DC
  Filled 2022-03-02 – 2022-03-03 (×2): qty 180, 30d supply, fill #0
  Filled 2022-03-23: qty 180, 30d supply, fill #1

## 2022-03-02 NOTE — Progress Notes (Incomplete Revision)
[facility]  [phone] Fax:([phone]) [phone]  Patient Care Team: [name], NP as PCP - General (Family Medicine)   Name of the patient: Sue Chase  [id number]  1956-02-19   Date of visit: 03/02/22  HPI: Patient is a 67 y.o. female with stage IV non-small cell lung cancer, adenocarcinoma KRAS G12C mutated. Patient previously on treatment with carboplatin, pemetrexed and pembrolizumab x 4 cycles, followed by maintenance pemetrexed/pembrolizumab (last treatment 02/15/22). Patient with signs of disease progression on recent scans, and presenting for discussion for starting 2nd line treatment with Alfred Levins [place] [facility])  Reason for Consult: Oral chemotherapy initial medication education for Alfred Levins [place] Fanny) therapy.   PAST MEDICAL HISTORY: Past Medical History:  Diagnosis Date   Colon polyps    Complication of anesthesia    tolerated propofol but other meds made her emotional / cry/ difficulty breathing/ panic   Hyperlipidemia    Rheumatic fever    Seasonal allergies    UTI (urinary tract infection)     HEMATOLOGY/ONCOLOGY HISTORY:  Oncology History  Adenocarcinoma of right lung, stage 4 ([place])  05/2021 Initial Diagnosis   Stage IV (T2a, N0, M1a) non-small cell lung cancer, adenocarcinoma presented with multifocal bilateral pulmonary nodules involving the right upper lobe as well as the smaller bilateral groundglass opacities diagnosed in April 2023.  PD-L1 expression is 4%.  Molecular studies by [employer] tissue test showed positive KRAS G12C mutation but the blood test failed secondary to insufficient circulating tumor DNA.   07/06/2021 Cancer Staging   Staging form: Lung, AJCC 8th Edition - Clinical: Stage IVA (cT2a, cN0, cM1a) - Signed by Curt Bears, MD on 07/06/2021   08/10/2021 - 02/15/2022 Chemotherapy   Patient is on Treatment Plan : LUNG Carboplatin (5) + Pemetrexed (500) + Pembrolizumab (200) D1 q21d  Induction x 4 cycles / Maintenance Pemetrexed (500) + Pembrolizumab (200) D1 q21d       ALLERGIES:  is allergic to codeine.  MEDICATIONS:  Current Outpatient Medications  Medication Sig Dispense Refill   acetaminophen (TYLENOL) 500 MG tablet Take 500 mg by mouth every 6 (six) hours as needed for moderate pain.     adagrasib (KRAZATI) 200 MG tablet Take 3 tablets (600 mg total) by mouth 2 (two) times daily. 180 tablet 3   albuterol (VENTOLIN HFA) 108 (90 Base) MCG/ACT inhaler Inhale 2 puffs into the lungs every 6 (six) hours as needed for wheezing or shortness of breath. 8 g 5   ALPRAZolam (XANAX) 0.25 MG tablet Take 1 tablet (0.25 mg total) by mouth 2 (two) times daily as needed for up to 7 days for anxiety. 14 tablet 0   Budeson-Glycopyrrol-Formoterol (BREZTRI AEROSPHERE) 160-9-4.8 MCG/ACT AERO Inhale 2 puffs into the lungs in the morning and at bedtime. (Patient not taking: Reported on 02/28/2022) 10.7 g 5   ELIQUIS 5 MG TABS tablet TAKE 1 TABLET BY MOUTH TWICE A DAY 60 tablet 1   FeFum-FePoly-FA-B Cmp-C-Biot (INTEGRA PLUS) CAPS Take 1 capsule by mouth every morning. 30 capsule 2   folic acid (FOLVITE) 1 MG tablet TAKE 1 TABLET BY MOUTH DAILY 30 tablet 4   lidocaine-prilocaine (EMLA) cream Apply to the Port-A-Cath site 30 minutes before chemotherapy (Patient taking differently: Apply 1 Application topically daily as needed. Apply to the Port-A-Cath site 30 minutes before chemotherapy) 30 g 0   magnesium oxide (MAG-OX) 400 (240 Mg) MG tablet Take 400 mg by mouth daily.     nicotine (NICODERM CQ - DOSED IN MG/24 HOURS)  14 mg/24hr patch 14 mg daily as needed (nicotene depedence). (Patient not taking: Reported on 02/28/2022)     omeprazole (PRILOSEC) 20 MG capsule Take 1 capsule (20 mg total) by mouth daily. 30 capsule 3   potassium chloride (KLOR-CON) 20 MEQ packet Take 40 mEq by mouth daily. 14 packet 0   predniSONE (DELTASONE) 10 MG tablet Label  & dispense according to the schedule below.   Please take prednisone with breakfast  5 Pills PO on day one, 4 Pills PO on day two, 3Pills PO on day three, 2 Pills PO on day four, 1 Pills PO on day five,  then STOP.  Total of 15 tabs 15 tablet 0   prochlorperazine (COMPAZINE) 10 MG tablet Take 1 tablet (10 mg total) by mouth every 6 (six) hours as needed for nausea or vomiting. 30 tablet 0   SYMBICORT 160-4.5 MCG/ACT inhaler Inhale 2 puffs into the lungs in the morning and at bedtime.     No current facility-administered medications for this visit.    VITAL SIGNS: There were no vitals taken for this visit. There were no vitals filed for this visit.  Estimated body mass index is 17.74 kg/m as calculated from the following:   Height as of 02/21/22: 5\' 3"  (1.6 m).   Weight as of an earlier encounter on 03/02/22: 45.4 kg (100 lb 2 oz).  LABS: CBC:    Component Value Date/Time   WBC 12.4 (H) 03/02/2022 1359   WBC 5.6 02/25/2022 0638   HGB 11.0 (L) 03/02/2022 1359   HCT 32.1 (L) 03/02/2022 1359   PLT 109 (L) 03/02/2022 1359   MCV 94.1 03/02/2022 1359   NEUTROABS 8.2 (H) 03/02/2022 1359   LYMPHSABS 2.2 03/02/2022 1359   MONOABS 1.8 (H) 03/02/2022 1359   EOSABS 0.0 03/02/2022 1359   BASOSABS 0.0 03/02/2022 1359   Comprehensive Metabolic Panel:    Component Value Date/Time   NA 139 03/02/2022 1359   K 3.0 (L) 03/02/2022 1359   CL 104 03/02/2022 1359   CO2 28 03/02/2022 1359   BUN 22 03/02/2022 1359   CREATININE 1.06 (H) 03/02/2022 1359   GLUCOSE 99 03/02/2022 1359   CALCIUM 8.5 (L) 03/02/2022 1359   AST 24 03/02/2022 1359   ALT 20 03/02/2022 1359   ALKPHOS 64 03/02/2022 1359   BILITOT 0.4 03/02/2022 1359   PROT 6.8 03/02/2022 1359   ALBUMIN 3.5 03/02/2022 1359     Present during today's visit: Patient  Start plan: pending PA approval - will update with exact start date.   Patient Education I spoke with patient for overview of new oral chemotherapy medication: Alfred Levins (adagrasib) for the treatment of metastatic non  small cell lung cancer, KRAS G12C mutated. Planned duration until disease progression or unacceptable drug toxicity.  CBC w/ Diff and CMP from 03/02/22 assessed - noted patient pltc down from 142 K/uL to 109 K/uL. WBC up to 12.4 K/uL, likely secondary to prednisone. Scr of 1.06 mg/dL (CrCl ~ 37 mL/min). No baseline renal or hepatic dose adjustments required. Patient had EKG obtained on 02/17/22 (QTcB 434ms). EKG OK to use as baseline and proceed with start per MD. Prescription dose and frequency assessed for appropriateness. Appropriate for therapy initiation.  Administration: Counseled patient on administration, dosing, side effects, monitoring, drug-food interactions, safe handling, storage, and disposal. Patient will take Krazati 200 mg tablets, 3 tablets (600 mg total) by mouth 2 (two) times daily. Recommended for patient to take Alfred Levins with food to help decrease GI upset.  Patient knows to avoid grapefruit and grapefruit juice while on Krazati.  Side Effects: Side effects include but not limited to: nausea/vomiting, diarrhea, hepatotoxicity, decrease in blood counts, fatigue, and edema. Rare risk of Qtc prolongation has been reported with medication - patient had baseline EKG as noted above. Nausea: patient states she should still have prochlorperazine on hand at home. Discussed using this as needed if patient develops nausea. Also discussed, that if patient experiences nausea while on Krazati, despite taking [place] with food, she could consider taking prochlorperazine 30-60 minutes before taking [place].  Diarrhea: recommended patient to obtain Imodium (loperamide) over the counter to have on hand at home if she experiences diarrhea. We discussed that she should reach out to the office if she experiences 4 or more loose stools over her baseline.     Drug-drug Interactions (DDI): Category D drug-drug interaction between [place] and [employer], both a strong CYP3A4 and PgP inhibitor, can  increase serum concentrations of Eliquis, thus increasing risk of bleeding from Eliquis. Discussed with patient that once she starts [place], she will need to reduce the dose of her Eliquis to 2.5 mg by mouth twice daily. Patient expressed understanding.  Category D drug-drug interaction between [place] and patient's inhalers Breztri and Symbicort - Alfred Levins may increase serum concentrations of her inhalers, thus a possibility for increased side effects from the inhalers. She states she will discuss this with her pulmonologist.  Concomitant use of Krazati and Xanax is not preferred, as there is a risk for Krazati to cause increased serum concentrations of Xanax, thus side effects (sedation, lethargy, dizziness, etc.) when using concomitantly. Patient states she is only on low dose uses very seldom, but has used recently due to outside stressors. MD notified about drug-drug interaction.   Adherence: After discussion with patient no patient barriers to medication adherence identified.  Reviewed with patient importance of keeping a medication schedule and plan for any missed doses.  Ms. Whitmoyer voiced understanding and appreciation. All questions answered. Medication handout provided.  Provided patient with [facility] phone number. Patient knows to call the office with questions or concerns. [facility] will continue to follow.  Patient expressed understanding and was in agreement with this plan. Ms. Villafranca also understands that she can call clinic at any time with any questions, concerns, or complaints.   Medication Access Issues: Grants are currently open for non-small cell lung cancer - I obtained patient's household size and income while in clinic today. [employer], Lyndee Leo, will apply for grants for patient to help reduce patient's out of pocket cost of Krazati to $0.    Thank you for allowing me to participate in the care of this patient.   Leron Croak, PharmD, BCPS, [facility] Hematology/Oncology Clinical Pharmacist [facility] [phone] 03/02/2022 4:03 PM

## 2022-03-02 NOTE — Telephone Encounter (Signed)
Patient would like the nurse or doctor to call her regarding a new medication that has been prescribed by her cancer doctor.  She stated that the new medication might interact with the inhalers that she is on and she would like advice from the doctor on what she should do.  Please advise and call to discuss further at [phone].  Patient stated that you can leave a VM at this number as well.

## 2022-03-02 NOTE — Telephone Encounter (Signed)
Oral Oncology Patient Advocate Encounter   Received notification that prior authorization for Sue Chase is required.   PA submitted on 03/02/22 Key [id number] Status is pending     Sue Chase, CPhT-Adv Oncology Pharmacy Patient [place] Direct Number: [phone]  Fax: [phone]

## 2022-03-02 NOTE — Telephone Encounter (Signed)
Patient called stating her Oncologist is planning to start her on [place]. She is worried this will interfere with the current inhalers she is on. She currently takes Symbicort and Ventolin.   Dr. Valeta Harms can you please advise?

## 2022-03-02 NOTE — Progress Notes (Signed)
[facility] [phone]   [phone]) [phone]  OFFICE PROGRESS NOTE  [name], NP [place] [address]  DIAGNOSIS:  Stage IV (T2a, N0, M1a) non-small cell lung cancer, adenocarcinoma presented with multifocal bilateral pulmonary nodules involving the right upper lobe as well as the smaller bilateral groundglass opacities diagnosed in April 2023.  PD-L1 expression is 4%.  Molecular studies by [employer] 360 tissue test showed positive KRAS G12C mutation but the blood test failed secondary to insufficient circulating tumor DNA.  PRIOR THERAPY: Systemic chemotherapy with carboplatin for AUC of 5, Alimta 500 Mg/M2 and Keytruda 200 Mg IV every 3 weeks.  First dose 08/10/2021.  Status post 8 cycles.  Starting from cycle #5 the patient is on maintenance treatment with Alimta and Keytruda every 3 weeks.  This treatment was discontinued secondary to disease progression  CURRENT THERAPY: Krazati (Adagrasib) 600 mg p.o. twice daily.  Expected to start in the next few days.  INTERVAL HISTORY: Sue Chase 67 y.o. female returns to the clinic today for follow-up visit and her daughter Sue Chase was available by phone during the visit.  The patient is feeling much better today with no concerning complaints except for mild fatigue.  She was admitted to the hospital on February 17, 2022 with acute respiratory failure, COPD exacerbation and suspicious pneumonia.  Angiogram of the chest performed on admission that showed no evidence for pulmonary embolus but there was increased size of spiculated lesion in the posterior right upper lobe as well as increased number of innumerable bilateral groundglass nodular lesions suspicious for intra organ metastasis.  Patient was treated with Solu-Medrol as well as IV ceftriaxone.  She was discharged home with tapered dose of steroids and she is feeling much better today.  She also received 2 units of PRBCs  transfusion during her hospitalization because of the chemotherapy induced anemia.  She denied having any current nausea, vomiting, diarrhea or constipation.  She has no headache or visual changes.  She has no weight loss or night sweats.  She is here for evaluation and discussion of her treatment options.  MEDICAL HISTORY: Past Medical History:  Diagnosis Date   Colon polyps    Complication of anesthesia    tolerated propofol but other meds made her emotional / cry/ difficulty breathing/ panic   Hyperlipidemia    Rheumatic fever    Seasonal allergies    UTI (urinary tract infection)     ALLERGIES:  is allergic to codeine.  MEDICATIONS:  Current Outpatient Medications  Medication Sig Dispense Refill   acetaminophen (TYLENOL) 500 MG tablet Take 500 mg by mouth every 6 (six) hours as needed for moderate pain.     albuterol (VENTOLIN HFA) 108 (90 Base) MCG/ACT inhaler Inhale 2 puffs into the lungs every 6 (six) hours as needed for wheezing or shortness of breath. 8 g 5   ALPRAZolam (XANAX) 0.25 MG tablet Take 1 tablet (0.25 mg total) by mouth 2 (two) times daily as needed for up to 7 days for anxiety. 14 tablet 0   Budeson-Glycopyrrol-Formoterol (BREZTRI AEROSPHERE) 160-9-4.8 MCG/ACT AERO Inhale 2 puffs into the lungs in the morning and at bedtime. (Patient not taking: Reported on 02/28/2022) 10.7 g 5   ELIQUIS 5 MG TABS tablet TAKE 1 TABLET BY MOUTH TWICE A DAY 60 tablet 1   FeFum-FePoly-FA-B Cmp-C-Biot (INTEGRA PLUS) CAPS Take 1 capsule by mouth every morning. 30 capsule 2   folic acid (FOLVITE) 1 MG tablet  TAKE 1 TABLET BY MOUTH DAILY 30 tablet 4   lidocaine-prilocaine (EMLA) cream Apply to the Port-A-Cath site 30 minutes before chemotherapy (Patient taking differently: Apply 1 Application topically daily as needed. Apply to the Port-A-Cath site 30 minutes before chemotherapy) 30 g 0   magnesium oxide (MAG-OX) 400 (240 Mg) MG tablet Take 400 mg by mouth daily.     nicotine (NICODERM CQ -  DOSED IN MG/24 HOURS) 14 mg/24hr patch 14 mg daily as needed (nicotene depedence). (Patient not taking: Reported on 02/28/2022)     omeprazole (PRILOSEC) 20 MG capsule Take 1 capsule (20 mg total) by mouth daily. 30 capsule 3   potassium chloride (KLOR-CON) 20 MEQ packet Take 40 mEq by mouth daily. 14 packet 0   predniSONE (DELTASONE) 10 MG tablet Label  & dispense according to the schedule below.  Please take prednisone with breakfast  5 Pills PO on day one, 4 Pills PO on day two, 3Pills PO on day three, 2 Pills PO on day four, 1 Pills PO on day five,  then STOP.  Total of 15 tabs 15 tablet 0   prochlorperazine (COMPAZINE) 10 MG tablet Take 1 tablet (10 mg total) by mouth every 6 (six) hours as needed for nausea or vomiting. 30 tablet 0   SYMBICORT 160-4.5 MCG/ACT inhaler Inhale 2 puffs into the lungs in the morning and at bedtime.     No current facility-administered medications for this visit.    SURGICAL HISTORY:  Past Surgical History:  Procedure Laterality Date   BRONCHIAL BIOPSY  06/08/2021   Procedure: BRONCHIAL BIOPSIES;  Surgeon: Garner Nash, DO;  Location: [facility];  Service: Pulmonary;;   BRONCHIAL BRUSHINGS  06/08/2021   Procedure: BRONCHIAL BRUSHINGS;  Surgeon: Garner Nash, DO;  Location: [facility];  Service: Pulmonary;;   BRONCHIAL NEEDLE ASPIRATION BIOPSY  06/08/2021   Procedure: BRONCHIAL NEEDLE ASPIRATION BIOPSIES;  Surgeon: Garner Nash, DO;  Location: [facility];  Service: Pulmonary;;   COLONOSCOPY  2018   ERCP N/A 11/05/2021   Procedure: ENDOSCOPIC RETROGRADE CHOLANGIOPANCREATOGRAPHY (ERCP);  Surgeon: Irene Shipper, MD;  Location: [facility];  Service: Gastroenterology;  Laterality: N/A;   IR IMAGING GUIDED PORT INSERTION  08/12/2021   REMOVAL OF STONES  11/05/2021   Procedure: REMOVAL OF STONES;  Surgeon: Irene Shipper, MD;  Location: [facility];  Service: Gastroenterology;;   SALIVARY GLAND SURGERY     LATE 80S   SPHINCTEROTOMY  11/05/2021    Procedure: SPHINCTEROTOMY;  Surgeon: Irene Shipper, MD;  Location: [facility];  Service: Gastroenterology;;   TUBAL LIGATION  1986   VIDEO BRONCHOSCOPY WITH RADIAL ENDOBRONCHIAL ULTRASOUND  06/08/2021   Procedure: VIDEO BRONCHOSCOPY WITH RADIAL ENDOBRONCHIAL ULTRASOUND;  Surgeon: Garner Nash, DO;  Location: [facility];  Service: Pulmonary;;    REVIEW OF SYSTEMS:  Constitutional: positive for fatigue Eyes: negative Ears, nose, mouth, throat, and face: negative Respiratory: positive for dyspnea on exertion Cardiovascular: negative Gastrointestinal: negative Genitourinary:negative Integument/breast: negative Hematologic/lymphatic: negative Musculoskeletal:negative Neurological: negative Behavioral/Psych: negative Endocrine: negative Allergic/Immunologic: negative   PHYSICAL EXAMINATION: General appearance: alert, cooperative, fatigued, and no distress Head: Normocephalic, without obvious abnormality, atraumatic Neck: no adenopathy, no JVD, supple, symmetrical, trachea midline, and thyroid not enlarged, symmetric, no tenderness/mass/nodules Lymph nodes: Cervical, supraclavicular, and axillary nodes normal. Resp: clear to auscultation bilaterally Back: symmetric, no curvature. ROM normal. No CVA tenderness. Cardio: regular rate and rhythm, S1, S2 normal, no murmur, click, rub or gallop GI: soft, non-tender; bowel sounds normal; no masses,  no organomegaly Extremities: extremities normal, atraumatic, no cyanosis or edema Neurologic: Alert and oriented X 3, normal strength and tone. Normal symmetric reflexes. Normal coordination and gait  ECOG PERFORMANCE STATUS: 1 - Symptomatic but completely ambulatory  Blood pressure (!) 144/76, pulse 78, temperature 97.7 F (36.5 C), temperature source Oral, resp. rate 16, weight 100 lb 2 oz (45.4 kg), SpO2 99 %.  LABORATORY DATA: Lab Results  Component Value Date   WBC 12.4 (H) 03/02/2022   HGB 11.0 (L) 03/02/2022   HCT 32.1 (L)  03/02/2022   MCV 94.1 03/02/2022   PLT 109 (L) 03/02/2022      Chemistry      Component Value Date/Time   NA 138 02/25/2022 0638   K 3.0 (L) 02/25/2022 0638   CL 103 02/25/2022 0638   CO2 27 02/25/2022 0638   BUN 27 (H) 02/25/2022 0638   CREATININE 0.91 02/25/2022 0638   CREATININE 1.05 (H) 02/15/2022 1115      Component Value Date/Time   CALCIUM 8.9 02/25/2022 0638   ALKPHOS 79 02/17/2022 1935   AST 23 02/17/2022 1935   AST 18 02/15/2022 1115   ALT 11 02/17/2022 1935   ALT 7 02/15/2022 1115   BILITOT 0.5 02/17/2022 1935   BILITOT 0.3 02/15/2022 1115       RADIOGRAPHIC STUDIES: ECHOCARDIOGRAM COMPLETE  Result Date: 02/22/2022    ECHOCARDIOGRAM REPORT   Patient Name:   [facility] Date of Exam: 02/22/2022 Medical Rec #:  [id number]           Height:       63.0 in Accession #:    [id number]          Weight:       107.8 lb Date of Birth:  07-May-1955            BSA:          1.487 m Patient Age:    28 years            BP:           141/85 mmHg Patient Gender: F                   HR:           89 bpm. Exam Location:  Inpatient Procedure: 2D Echo, Cardiac Doppler and Color Doppler Indications:    R06.00 Dyspnea  History:        Patient has prior history of Echocardiogram examinations, most                 recent 02/19/2021. COPD; Risk Factors:Dyslipidemia and Current                 Smoker. Acute respiratory failure with hypoxia ([place]), Hx of                 Acute pulmonary embolism ([place]).  Sonographer:    Alvino Chapel RCS Referring Phys: [id number] Parchment  1. Left ventricular ejection fraction, by estimation, is 60 to 65%. The left ventricle has normal function. The left ventricle has no regional wall motion abnormalities. Left ventricular diastolic parameters are consistent with Grade I diastolic dysfunction (impaired relaxation).  2. Right ventricular systolic function is normal. The right ventricular size is normal. There is normal pulmonary artery systolic pressure.  The estimated right ventricular systolic pressure is 67.6 mmHg.  3. The mitral valve is normal in structure. No evidence of mitral valve regurgitation. No evidence of mitral stenosis.  4. The aortic valve is normal in structure. Aortic valve regurgitation is not visualized. No aortic stenosis is present.  5. The inferior vena cava is normal in size with greater than 50% respiratory variability, suggesting right atrial pressure of 3 mmHg. Comparison(s): No significant change from prior study. Prior images reviewed side by side. FINDINGS  Left Ventricle: Left ventricular ejection fraction, by estimation, is 60 to 65%. The left ventricle has normal function. The left ventricle has no regional wall motion abnormalities. The left ventricular internal cavity size was normal in size. There is  no left ventricular hypertrophy. Left ventricular diastolic parameters are consistent with Grade I diastolic dysfunction (impaired relaxation). Indeterminate filling pressures. Right Ventricle: The right ventricular size is normal. No increase in right ventricular wall thickness. Right ventricular systolic function is normal. There is normal pulmonary artery systolic pressure. The tricuspid regurgitant velocity is 2.29 m/s, and  with an assumed right atrial pressure of 3 mmHg, the estimated right ventricular systolic pressure is 28.4 mmHg. Left Atrium: Left atrial size was normal in size. Right Atrium: Right atrial size was normal in size. Pericardium: There is no evidence of pericardial effusion. Mitral Valve: The mitral valve is normal in structure. No evidence of mitral valve regurgitation. No evidence of mitral valve stenosis. Tricuspid Valve: The tricuspid valve is normal in structure. Tricuspid valve regurgitation is not demonstrated. No evidence of tricuspid stenosis. Aortic Valve: The aortic valve is normal in structure. Aortic valve regurgitation is not visualized. No aortic stenosis is present. Pulmonic Valve: The pulmonic  valve was normal in structure. Pulmonic valve regurgitation is not visualized. No evidence of pulmonic stenosis. Aorta: The aortic root is normal in size and structure. Venous: The inferior vena cava is normal in size with greater than 50% respiratory variability, suggesting right atrial pressure of 3 mmHg. IAS/Shunts: No atrial level shunt detected by color flow Doppler.  LEFT VENTRICLE PLAX 2D LVIDd:         4.40 cm   Diastology LVIDs:         2.70 cm   LV e' medial:    6.63 cm/s LV PW:         0.90 cm   LV E/e' medial:  13.3 LV IVS:        0.80 cm   LV e' lateral:   8.49 cm/s LVOT diam:     1.80 cm   LV E/e' lateral: 10.4 LV SV:         57 LV SV Index:   38 LVOT Area:     2.54 cm  RIGHT VENTRICLE RV S prime:     18.40 cm/s TAPSE (M-mode): 2.4 cm LEFT ATRIUM             Index        RIGHT ATRIUM           Index LA diam:        3.00 cm 2.02 cm/m   RA Area:     14.70 cm LA Vol (A2C):   41.7 ml 28.04 ml/m  RA Volume:   38.80 ml  26.09 ml/m LA Vol (A4C):   37.9 ml 25.49 ml/m LA Biplane Vol: 40.7 ml 27.37 ml/m  AORTIC VALVE LVOT Vmax:   115.00 cm/s LVOT Vmean:  75.100 cm/s LVOT VTI:    0.224 m  AORTA Ao Root diam: 3.00 cm MITRAL VALVE                TRICUSPID VALVE MV Area (PHT): 2.69  cm     TR Peak grad:   21.0 mmHg MV Decel Time: 282 msec     TR Vmax:        229.00 cm/s MV E velocity: 88.30 cm/s MV A velocity: 103.00 cm/s  SHUNTS MV E/A ratio:  0.86         Systemic VTI:  0.22 m                             Systemic Diam: 1.80 cm Sanda Klein MD Electronically signed by Sanda Klein MD Signature Date/Time: 02/22/2022/4:05:23 PM    Final    CT Angio Chest PE W and/or Wo Contrast  Result Date: 02/17/2022 CLINICAL DATA:  Cancer patient with worsening shortness of breath. Prior history of pulmonary embolism. Weakness and fatigue onset today. EXAM: CT ANGIOGRAPHY CHEST WITH CONTRAST TECHNIQUE: Multidetector CT imaging of the chest was performed using the standard protocol during bolus administration of  intravenous contrast. Multiplanar CT image reconstructions and MIPs were obtained to evaluate the vascular anatomy. RADIATION DOSE REDUCTION: This exam was performed according to the departmental dose-optimization program which includes automated exposure control, adjustment of the mA and/or kV according to patient size and/or use of iterative reconstruction technique. CONTRAST:  51mL OMNIPAQUE IOHEXOL 350 MG/ML SOLN COMPARISON:  PET-CT 06/25/2021, CT chest with contrast more recently 12/30/2021. FINDINGS: Cardiovascular: Right IJ port catheter terminates in the distal SVC. The cardiac size is normal. Scattered single-vessel coronary artery calcification noted in the LAD. There is again a small pericardial effusion anteriorly. Pulmonary arteries and veins are normal caliber no arterial embolism is seen. There is aortic atherosclerosis, primarily in the arch where there is mixed soft and calcific plaque again noted with increasingly ulcerative soft plaque along the anterior inferior wall of the arch. There is scattered calcification in the great vessels. There is no aortic or great vessel dissection or stenosis. Slight dilatation again noted in the ascending aorta to 4 cm, stable. Remainder within normal caliber limits. Mediastinum/Nodes: Borderline prominent hilar lymph nodes are again noted up to 9 mm in short axis with a few slightly prominent right paratracheal nodes up to 1 cm in short axis. No other adenopathy is seen. No abnormality is seen thoracic trachea, thoracic esophagus and thyroid. Lungs/Pleura: Mixed attenuation semisolid lesion abutting the fissure in the posterior segment of the right upper lobe is stable to slightly increased in size, current measurements of 3.4 x 2.1 cm on 6:46, at a similar level previously having measured 3.2 x 2 cm. Associated architectural distortion is similar, with the adjacent fissure invaginated towards the lesion as before. Spiculations surrounding this lesion appear  similar. There are innumerable bilateral nodular ground-glass lesions suspected to be intraorgan metastases. There is a more solid nodule abutting the fissure in the anterior basal segment of the right upper lobe, on 6:105 today measuring 7.3 mm, previously 6.3 mm. Additional areas of ground-glass subsolid disease include a lesion measuring 2 cm in the right lower lobe on 6:74, unchanged, 1.4 cm right lower lobe ground-glass lesion anteriorly on 6:81 also unchanged, and a left lower lobe 2 cm ground-glass lesion on 6:109 which is also unchanged. Innumerable much smaller ground-glass lesions throughout both lungs appear to have increased in number. Diffuse bronchial thickening is noted and mild emphysematous disease with additional scar-like opacities in the bases. There is no pleural effusion.  No airspace consolidation is seen. Upper Abdomen: No acute findings. Musculoskeletal: Mild-to-moderate superior endplate anterior wedge  compression fracture of T9 is unchanged. There is osteopenia and mild degenerative change of the spine. No destructive bone lesion is seen. There is progressive thinning of the body wall fat, mild body wall anasarca, findings concerning for cachexia and malnutrition. Review of the MIP images confirms the above findings. IMPRESSION: 1. No evidence of arterial embolus. 2. Aortic atherosclerosis with increasingly ulcerative soft plaque in the arch. 3. Stable 4 cm ascending aortic dilatation. 4. Stable borderline prominent hilar and mediastinal lymph nodes. 5. Stable to slightly increased size of mixed attenuation semisolid spiculated lesion in the posterior segment of the right upper lobe. 6. Innumerable bilateral ground-glass nodular lesions suspected to be intraorgan metastases, and appear to have increased in number. 7. Diffuse bronchial thickening and mild emphysema. 8. Progressive thinning of the body wall fat, mild body wall anasarca, findings concerning for cachexia and malnutrition. 9.  Stable T9 superior endplate anterior wedge compression fracture. Aortic Atherosclerosis (ICD10-I70.0) and Emphysema (ICD10-J43.9). Electronically Signed   By: Telford Nab M.D.   On: 02/17/2022 22:02   DG Chest 2 View  Result Date: 02/17/2022 CLINICAL DATA:  Lung cancer, dyspnea EXAM: CHEST - 2 VIEW COMPARISON:  06/08/2021 FINDINGS: Limb defined left upper lobe opacity has decreased in size since prior examination possibly related to interval response to therapy. The lungs are symmetrically hyperinflated. No new focal pulmonary nodules or infiltrates. No pneumothorax or pleural effusion. Cardiac size within normal limits. Right internal jugular chest port tip noted within the superior vena cava. Pulmonary vascularity is normal. No acute bone abnormality. IMPRESSION: 1. No radiographic evidence of acute cardiopulmonary disease. 2. COPD 3. Interval decrease in size of left upper lobe opacity, possibly related to interval response to therapy. This could be better assessed with repeat CT imaging if indicated. Electronically Signed   By: Fidela Salisbury M.D.   On: 02/17/2022 19:52    ASSESSMENT AND PLAN: This is a very pleasant 67 years old white female diagnosed with stage IV (T2 a, N0, M1 a) non-small cell lung cancer, adenocarcinoma presented with multifocal bilateral pulmonary nodules involving the right upper lobe as well as the smaller bilateral groundglass opacities diagnosed in April 2023 with positive KRAS G12C mutation and PD-L1 expression of 4%. She underwent systemic chemotherapy with carboplatin for AUC of 5, pemetrexed 500 Mg/M2 and Keytruda 200 Mg IV every 3 weeks status post 8 cycles.  Starting from cycle #5 the patient is on maintenance treatment with Alimta and Keytruda every 3 weeks. The patient has been tolerating this treatment well with no concerning adverse effects except for fatigue. She had repeat CT scan of the chest during her recent hospitalization that showed concerning finding for  disease progression with increase in the size of the loculated nodule in the posterior right upper lobe as well as increase in size of innumerable bilateral pleural nodule. I had a lengthy discussion with the patient and her daughter today about her current condition and treatment options. I recommended for the patient to discontinue her current treatment with maintenance Alimta and Keytruda because of concern about disease progression. I discussed with her other treatment options including palliative care versus second line treatment with targeted therapy with Krazati (Adagrasib) 600 mg p.o. twice daily versus second line systemic chemotherapy with docetaxel and Cyramza.  The patient and her daughter are interested in proceeding with the targeted therapy.  I discussed with her the adverse effect of this treatment including but not limited to nausea, vomiting, diarrhea, liver dysfunction, QT prolongation as well as  possibility for interstitial lung disease. The patient will be seen by the pharmacist for oral oncolytic today for more teaching and also help the patient obtaining her medication. She is expected to start the first dose of this treatment in the next few days.  She had an EKG performed in the hospital that showed no QT prolongation. I will see the patient back for follow-up visit in around 3 weeks for evaluation and management of any adverse effect of her treatment. For the COPD and her current treatment with inhalers, she will follow-up with Dr. Valeta Harms for management of this condition. The patient was advised to call immediately if she has any other concerning symptoms in the interval.  The patient voices understanding of current disease status and treatment options and is in agreement with the current care plan. All questions were answered. The patient knows to call the clinic with any problems, questions or concerns. We can certainly see the patient much sooner if necessary.  The total time  spent in the appointment was 40 minutes.  Disclaimer: This note was dictated with voice recognition software. Similar sounding words can inadvertently be transcribed and may not be corrected upon review.

## 2022-03-02 NOTE — Telephone Encounter (Signed)
Called and spoke with pt letting her know info per [facility] and she verbalized understanding.  Nothing further needed.

## 2022-03-02 NOTE — Progress Notes (Signed)
[facility]  Telephone:(336) [phone] Fax:(336) [phone]  Patient Care Team: [name], NP as PCP - General (Family Medicine)   Name of the patient: Sue Chase  [id number]  1955-03-09   Date of visit: 03/02/22  HPI: Patient is a 67 y.o. female with stage IV non-small cell lung cancer, adenocarcinoma KRAS G12C mutated. Patient previously on treatment with carboplatin, pemetrexed and pembrolizumab x 4 cycles, followed by maintenance pemetrexed/pembrolizumab (last treatment 02/15/22). Patient with signs of disease progression on recent scans, and presenting for discussion for starting 2nd line treatment with Alfred Levins Dulcy Fanny)  Reason for Consult: Oral chemotherapy initial medication education for Alfred Levins Dulcy Fanny) therapy.   PAST MEDICAL HISTORY: Past Medical History:  Diagnosis Date   Colon polyps    Complication of anesthesia    tolerated propofol but other meds made her emotional / cry/ difficulty breathing/ panic   Hyperlipidemia    Rheumatic fever    Seasonal allergies    UTI (urinary tract infection)     HEMATOLOGY/ONCOLOGY HISTORY:  Oncology History  Adenocarcinoma of right lung, stage 4 ([place])  05/2021 Initial Diagnosis   Stage IV (T2a, N0, M1a) non-small cell lung cancer, adenocarcinoma presented with multifocal bilateral pulmonary nodules involving the right upper lobe as well as the smaller bilateral groundglass opacities diagnosed in April 2023.  PD-L1 expression is 4%.  Molecular studies by [employer] 360 tissue test showed positive KRAS G12C mutation but the blood test failed secondary to insufficient circulating tumor DNA.   07/06/2021 Cancer Staging   Staging form: Lung, AJCC 8th Edition - Clinical: Stage IVA (cT2a, cN0, cM1a) - Signed by Curt Bears, MD on 07/06/2021   08/10/2021 - 02/15/2022 Chemotherapy   Patient is on Treatment Plan : LUNG Carboplatin (5) + Pemetrexed (500) + Pembrolizumab (200) D1 q21d  Induction x 4 cycles / Maintenance Pemetrexed (500) + Pembrolizumab (200) D1 q21d       ALLERGIES:  is allergic to codeine.  MEDICATIONS:  Current Outpatient Medications  Medication Sig Dispense Refill   acetaminophen (TYLENOL) 500 MG tablet Take 500 mg by mouth every 6 (six) hours as needed for moderate pain.     adagrasib (KRAZATI) 200 MG tablet Take 3 tablets (600 mg total) by mouth 2 (two) times daily. 180 tablet 3   albuterol (VENTOLIN HFA) 108 (90 Base) MCG/ACT inhaler Inhale 2 puffs into the lungs every 6 (six) hours as needed for wheezing or shortness of breath. 8 g 5   ALPRAZolam (XANAX) 0.25 MG tablet Take 1 tablet (0.25 mg total) by mouth 2 (two) times daily as needed for up to 7 days for anxiety. 14 tablet 0   Budeson-Glycopyrrol-Formoterol (BREZTRI AEROSPHERE) 160-9-4.8 MCG/ACT AERO Inhale 2 puffs into the lungs in the morning and at bedtime. (Patient not taking: Reported on 02/28/2022) 10.7 g 5   ELIQUIS 5 MG TABS tablet TAKE 1 TABLET BY MOUTH TWICE A DAY 60 tablet 1   FeFum-FePoly-FA-B Cmp-C-Biot (INTEGRA PLUS) CAPS Take 1 capsule by mouth every morning. 30 capsule 2   folic acid (FOLVITE) 1 MG tablet TAKE 1 TABLET BY MOUTH DAILY 30 tablet 4   lidocaine-prilocaine (EMLA) cream Apply to the Port-A-Cath site 30 minutes before chemotherapy (Patient taking differently: Apply 1 Application topically daily as needed. Apply to the Port-A-Cath site 30 minutes before chemotherapy) 30 g 0   magnesium oxide (MAG-OX) 400 (240 Mg) MG tablet Take 400 mg by mouth daily.     nicotine (NICODERM CQ - DOSED IN MG/24 HOURS)  14 mg/24hr patch 14 mg daily as needed (nicotene depedence). (Patient not taking: Reported on 02/28/2022)     omeprazole (PRILOSEC) 20 MG capsule Take 1 capsule (20 mg total) by mouth daily. 30 capsule 3   potassium chloride (KLOR-CON) 20 MEQ packet Take 40 mEq by mouth daily. 14 packet 0   predniSONE (DELTASONE) 10 MG tablet Label  & dispense according to the schedule below.   Please take prednisone with breakfast  5 Pills PO on day one, 4 Pills PO on day two, 3Pills PO on day three, 2 Pills PO on day four, 1 Pills PO on day five,  then STOP.  Total of 15 tabs 15 tablet 0   prochlorperazine (COMPAZINE) 10 MG tablet Take 1 tablet (10 mg total) by mouth every 6 (six) hours as needed for nausea or vomiting. 30 tablet 0   SYMBICORT 160-4.5 MCG/ACT inhaler Inhale 2 puffs into the lungs in the morning and at bedtime.     No current facility-administered medications for this visit.    VITAL SIGNS: There were no vitals taken for this visit. There were no vitals filed for this visit.  Estimated body mass index is 17.74 kg/m as calculated from the following:   Height as of 02/21/22: 5\' 3"  (1.6 m).   Weight as of an earlier encounter on 03/02/22: 45.4 kg (100 lb 2 oz).  LABS: CBC:    Component Value Date/Time   WBC 12.4 (H) 03/02/2022 1359   WBC 5.6 02/25/2022 0638   HGB 11.0 (L) 03/02/2022 1359   HCT 32.1 (L) 03/02/2022 1359   PLT 109 (L) 03/02/2022 1359   MCV 94.1 03/02/2022 1359   NEUTROABS 8.2 (H) 03/02/2022 1359   LYMPHSABS 2.2 03/02/2022 1359   MONOABS 1.8 (H) 03/02/2022 1359   EOSABS 0.0 03/02/2022 1359   BASOSABS 0.0 03/02/2022 1359   Comprehensive Metabolic Panel:    Component Value Date/Time   NA 139 03/02/2022 1359   K 3.0 (L) 03/02/2022 1359   CL 104 03/02/2022 1359   CO2 28 03/02/2022 1359   BUN 22 03/02/2022 1359   CREATININE 1.06 (H) 03/02/2022 1359   GLUCOSE 99 03/02/2022 1359   CALCIUM 8.5 (L) 03/02/2022 1359   AST 24 03/02/2022 1359   ALT 20 03/02/2022 1359   ALKPHOS 64 03/02/2022 1359   BILITOT 0.4 03/02/2022 1359   PROT 6.8 03/02/2022 1359   ALBUMIN 3.5 03/02/2022 1359    Present during today's visit: Patient  Start plan: Plan for patient to start [facility] 03/08/22 AM.   Patient Education I spoke with patient for overview of new oral chemotherapy medication: Alfred Levins (adagrasib) for the treatment of metastatic non small cell  lung cancer, KRAS G12C mutated. Planned duration until disease progression or unacceptable drug toxicity.  CBC w/ Diff and CMP from 03/02/22 assessed - noted patient pltc down from 142 K/uL to 109 K/uL. WBC up to 12.4 K/uL, likely secondary to prednisone. Scr of 1.06 mg/dL (CrCl ~ 37 mL/min). No baseline renal or hepatic dose adjustments required. Patient had EKG obtained on 02/17/22 (QTcB 466ms). EKG OK to use as baseline and proceed with start per MD. Prescription dose and frequency assessed for appropriateness. Appropriate for therapy initiation.  Administration: Counseled patient on administration, dosing, side effects, monitoring, drug-food interactions, safe handling, storage, and disposal. Patient will take Krazati 200 mg tablets, 3 tablets (600 mg total) by mouth 2 (two) times daily. Recommended for patient to take Alfred Levins with food to help decrease GI upset.   Patient  knows to avoid grapefruit and grapefruit juice while on Krazati.  Side Effects: Side effects include but not limited to: nausea/vomiting, diarrhea, hepatotoxicity, decrease in blood counts, fatigue, and edema. Rare risk of Qtc prolongation has been reported with medication - patient had baseline EKG as noted above. Nausea: patient states she should still have prochlorperazine on hand at home. Discussed using this as needed if patient develops nausea. Also discussed, that if patient experiences nausea while on Krazati, despite taking [place] with food, she could consider taking prochlorperazine 30-60 minutes before taking [place].  Diarrhea: recommended patient to obtain Imodium (loperamide) over the counter to have on hand at home if she experiences diarrhea. We discussed that she should reach out to the office if she experiences 4 or more loose stools over her baseline.     Drug-drug Interactions (DDI): Category D drug-drug interaction between [place] and [employer], both a strong CYP3A4 and PgP inhibitor, can increase  serum concentrations of Eliquis, thus increasing risk of bleeding from Eliquis. Discussed with patient that once she starts [place], she will need to reduce the dose of her Eliquis to 2.5 mg by mouth twice daily. Patient expressed understanding. Dr. Julien Nordmann has sent in reduced dose of Eliquis 2.5 mg for patient to start once she begins [place].  Category D drug-drug interaction between [place] and patient's inhalers Breztri and Symbicort - Alfred Levins may increase serum concentrations of her inhalers, thus a possibility for increased side effects from the inhalers. She states she will discuss this with her pulmonologist.  Concomitant use of Krazati and Xanax is not preferred, as there is a risk for Krazati to cause increased serum concentrations of Xanax, thus side effects (sedation, lethargy, dizziness, etc.) when using concomitantly. Patient states she is only on low dose uses very seldom, but has used recently due to outside stressors. MD notified about drug-drug interaction - due to risk of increase side effects from Xanax refill will not be sent at this time. Recommend patient reach out to PCP office for continued prescribing of medication PRN anxiety.   Adherence: After discussion with patient no patient barriers to medication adherence identified.  Reviewed with patient importance of keeping a medication schedule and plan for any missed doses.  Sue Chase voiced understanding and appreciation. All questions answered. Medication handout provided.  Provided patient with [facility] phone number. Patient knows to call the office with questions or concerns. [facility] will continue to follow.  Patient expressed understanding and was in agreement with this plan. Sue Chase also understands that she can call clinic at any time with any questions, concerns, or complaints.   Medication Access Issues: PA has been approved for [employer]. Patient's copay is  $181.77. Grants are currently open for non-small cell lung cancer - I obtained patient's household size and income while in clinic today. [employer], Lyndee Leo, will apply for grants for patient to help reduce patient's out of pocket cost of Krazati to $0.    Thank you for allowing me to participate in the care of this patient.   Leron Croak, PharmD, BCPS, [facility] Hematology/Oncology Clinical Pharmacist [facility] [place] [phone] 03/02/2022 4:03 PM

## 2022-03-03 ENCOUNTER — Other Ambulatory Visit: Payer: Self-pay | Admitting: Physician Assistant

## 2022-03-03 ENCOUNTER — Other Ambulatory Visit (HOSPITAL_COMMUNITY): Payer: Self-pay

## 2022-03-03 NOTE — Telephone Encounter (Signed)
Oral Oncology Patient Advocate Encounter  Prior Authorization for Sue Chase has been approved.    PA# [id number]  Effective dates: 03/02/22 through 03/03/23  Patients co-pay is $181.77.    [name], CPhT-Adv Oncology Pharmacy Patient [facility] Direct Number: [phone]  Fax: [phone]

## 2022-03-04 ENCOUNTER — Other Ambulatory Visit (HOSPITAL_COMMUNITY): Payer: Self-pay

## 2022-03-04 ENCOUNTER — Other Ambulatory Visit: Payer: Self-pay

## 2022-03-07 ENCOUNTER — Telehealth: Payer: Self-pay | Admitting: Internal Medicine

## 2022-03-07 NOTE — Telephone Encounter (Signed)
Called patient regarding upcoming January appointments, left a voicemail.

## 2022-03-08 ENCOUNTER — Telehealth: Payer: Self-pay | Admitting: Medical Oncology

## 2022-03-08 ENCOUNTER — Other Ambulatory Visit: Payer: Medicare Other

## 2022-03-08 ENCOUNTER — Ambulatory Visit: Payer: Medicare Other | Admitting: Internal Medicine

## 2022-03-08 ENCOUNTER — Ambulatory Visit: Payer: Medicare Other

## 2022-03-08 NOTE — Telephone Encounter (Signed)
Pt notified to stop iron tablets.

## 2022-03-09 ENCOUNTER — Ambulatory Visit (INDEPENDENT_AMBULATORY_CARE_PROVIDER_SITE_OTHER): Payer: Medicare Other | Admitting: Adult Health

## 2022-03-09 ENCOUNTER — Encounter: Payer: Self-pay | Admitting: Adult Health

## 2022-03-09 VITALS — BP 110/80 | HR 87 | Ht 63.0 in | Wt 100.0 lb

## 2022-03-09 DIAGNOSIS — D638 Anemia in other chronic diseases classified elsewhere: Secondary | ICD-10-CM | POA: Diagnosis not present

## 2022-03-09 DIAGNOSIS — E876 Hypokalemia: Secondary | ICD-10-CM

## 2022-03-09 DIAGNOSIS — N182 Chronic kidney disease, stage 2 (mild): Secondary | ICD-10-CM

## 2022-03-09 DIAGNOSIS — C3491 Malignant neoplasm of unspecified part of right bronchus or lung: Secondary | ICD-10-CM | POA: Diagnosis not present

## 2022-03-09 DIAGNOSIS — E871 Hypo-osmolality and hyponatremia: Secondary | ICD-10-CM

## 2022-03-09 DIAGNOSIS — Z86711 Personal history of pulmonary embolism: Secondary | ICD-10-CM

## 2022-03-09 DIAGNOSIS — J441 Chronic obstructive pulmonary disease with (acute) exacerbation: Secondary | ICD-10-CM | POA: Diagnosis not present

## 2022-03-09 NOTE — Progress Notes (Addendum)
Subjective:    Patient ID: Sue Chase, female    DOB: [birth year], 67 y.o.   MRN: [id number]  HPI 67 year old female who  has a past medical history of Colon polyps, Complication of anesthesia, Hyperlipidemia, Rheumatic fever, Seasonal allergies, and UTI (urinary tract infection).  She presents to the office today for TCM visit   Admit Date 02/17/2022 Discharge Date 02/25/2021  Presented to the emergency room for evaluation of dyspnea.  She has some dyspnea related to her COPD and lung cancer that is pretty well-managed with Symbicort and albuterol as needed.  On 02/17/2022 she developed severe dyspnea while at rest that was not relieved with her rescue inhaler.  She subsequently developed a cough that was productive of yellow sputum.  She did not have any chest pain, nausea, vomiting, or abdominal pain.  EMS was called and she was found to have SpO2 in the low 90s on room air and was very tachypneic.  Labs in the ER showed a WBC of 13.7, hemoglobin 9.0, platelets 370,000 sodium 138, potassium 3.1, bicarb 24, BUN 14, creatinine 1.04, glucose 116, troponin negative x 2, BNP 85.5  COVID, influenza, and RSV PCR were negative  CTA of the chest negative for embolus.  Stable 4 cm ascending aortic dilation noted.  Stable prominent hilar and distal lymph nodes, slightly increased size of spiculated lesion in posterior right upper lobe noted.  Increased number of bilateral groundglass nodular regions suspected to be intra organ metastasis noted.  Bronchial wall thickening, mild emphysema, and findings of cachexia and malnutrition noted  She was noted to desaturate to the mid to high 80s on room air.  She was given IV Solu-Medrol 125 mg, 1 L LR, DuoNeb, IV ceftriaxone, and or okay for 40 mill equivalents.  She was then admitted for further management  Hospital Course  COPD exacerbation/Acute respiratory failure  with hypoxia  -She apparently recently stopped smoking after 45-pack-year history  of smoking. -DuoNebs and Dulera  doing well on steroid taper, she was weaned off oxygen supplementation and O2 sats ranged from 92-94 on room air while ambulating in hallway  Stage IV adenocarcinoma of right lung -By Dr. [name].  On active chemotherapy with carboplatin, Alimta, and Keytruda.  -THS shows slight increase size of spiculated right upper lobe lesion increased number of bilateral groundglass nodule lesions suspected to be intra organ metastasis  Anemia of chronic disease due to chemotherapy -Globin 7.1 on 02/18/2022.  Status post 2 units packed red blood cell transfusion.  Globin has been stable posttransfusion with a hemoglobin of 12.6 at time of discharge. -.  Complaint of black stools but was recently started on iron supplement.  Fecal occult blood test negative.  Continue PPI and follow-up with PCP and GI  hypokameia, hypomagnesia Mia, hypophosphatemia -Replaced  CKD II - Cr at baseline   H/o PE/RLE DVT -Continued on Eliquis  Today she reports that she feels as though she is back to baseline.  She has been seen by Dr. [name], they repeated blood work there, potassium was on the low side at 3.0 and she was placed on 40 mill of equivalents daily of supplementation.  She is also on magnesium supplementation.  Eliquis was changed from 5 mg to 2.5 mg and she was placed on Breo as tree inhaler instead of Dulera.  He was also placed on Krazati for lung cancer with mets.     Review of Systems  Constitutional:  Positive for fatigue.  HENT: Negative.  Eyes: Negative.   Respiratory:  Positive for shortness of breath.   Cardiovascular: Negative.   Gastrointestinal: Negative.   Endocrine: Negative.   Genitourinary: Negative.   Musculoskeletal: Negative.   Skin: Negative.   Allergic/Immunologic: Negative.   Neurological: Negative.   Hematological: Negative.   Psychiatric/Behavioral: Negative.     Past Medical History:  Diagnosis Date   Colon polyps    Complication of  anesthesia    tolerated propofol but other meds made her emotional / cry/ difficulty breathing/ panic   Hyperlipidemia    Rheumatic fever    Seasonal allergies    UTI (urinary tract infection)     Social History   Socioeconomic History   Marital status: Widowed    Spouse name: Not on file   Number of children: Not on file   Years of education: Not on file   Highest education level: Some college, no degree  Occupational History   Not on file  Tobacco Use   Smoking status: Some Days    Packs/day: 1.00    Years: 40.00    Total pack years: 40.00    Types: Cigarettes    Passive exposure: Never   Smokeless tobacco: Never   Tobacco comments:    1/2 ppd or less 06/15/21. Hsm/.   Vaping Use   Vaping Use: Never used  Substance and Sexual Activity   Alcohol use: Yes    Comment: Rum and Coke/Unsure of amount   Drug use: Never   Sexual activity: Not on file  Other Topics Concern   Not on file  Social History Narrative   Married    Two grown children    She enjoys reading, walking   Social Determinants of Health   Financial Resource Strain: Medium Risk (10/18/2021)   Overall Financial Resource Strain (CARDIA)    Difficulty of Paying Living Expenses: Somewhat hard  Food Insecurity: No Food Insecurity (02/28/2022)   Hunger Vital Sign    Worried About Running Out of Food in the Last Year: Never true    Ran Out of Food in the Last Year: Never true  Transportation Needs: No Transportation Needs (02/28/2022)   PRAPARE - Administrator, Civil Service (Medical): No    Lack of Transportation (Non-Medical): No  Physical Activity: Unknown (10/18/2021)   Exercise Vital Sign    Days of Exercise per Week: Patient refused    Minutes of Exercise per Session: Not on file  Stress: No Stress Concern Present (10/18/2021)   Sue Chase of Occupational Health - Occupational Stress Questionnaire    Feeling of Stress : Only a little  Social Connections: Unknown (10/18/2021)   Social  Connection and Isolation Panel [NHANES]    Frequency of Communication with Friends and Family: Three times a week    Frequency of Social Gatherings with Friends and Family: Patient refused    Attends Religious Services: Patient refused    Active Member of Clubs or Organizations: No    Attends Engineer, structural: Not on file    Marital Status: Widowed  Intimate Partner Violence: Not At Risk (02/18/2022)   Humiliation, Afraid, Rape, and Kick questionnaire    Fear of Current or Ex-Partner: No    Emotionally Abused: No    Physically Abused: No    Sexually Abused: No    Past Surgical History:  Procedure Laterality Date   BRONCHIAL BIOPSY  06/08/2021   Procedure: BRONCHIAL BIOPSIES;  Surgeon: Josephine Igo, DO;  Location: [facility];  Service: Pulmonary;;  BRONCHIAL BRUSHINGS  06/08/2021   Procedure: BRONCHIAL BRUSHINGS;  Surgeon: Josephine Igo, DO;  Location: [facility];  Service: Pulmonary;;   BRONCHIAL NEEDLE ASPIRATION BIOPSY  06/08/2021   Procedure: BRONCHIAL NEEDLE ASPIRATION BIOPSIES;  Surgeon: Josephine Igo, DO;  Location: [facility];  Service: Pulmonary;;   COLONOSCOPY  2018   ERCP N/A 11/05/2021   Procedure: ENDOSCOPIC RETROGRADE CHOLANGIOPANCREATOGRAPHY (ERCP);  Surgeon: Hilarie Fredrickson, MD;  Location: [facility];  Service: Gastroenterology;  Laterality: N/A;   IR IMAGING GUIDED PORT INSERTION  08/12/2021   REMOVAL OF STONES  11/05/2021   Procedure: REMOVAL OF STONES;  Surgeon: Hilarie Fredrickson, MD;  Location: [facility];  Service: Gastroenterology;;   SALIVARY GLAND SURGERY     LATE 80S   SPHINCTEROTOMY  11/05/2021   Procedure: SPHINCTEROTOMY;  Surgeon: Hilarie Fredrickson, MD;  Location: [facility];  Service: Gastroenterology;;   TUBAL LIGATION  1986   VIDEO BRONCHOSCOPY WITH RADIAL ENDOBRONCHIAL ULTRASOUND  06/08/2021   Procedure: VIDEO BRONCHOSCOPY WITH RADIAL ENDOBRONCHIAL ULTRASOUND;  Surgeon: Josephine Igo, DO;  Location: [facility];  Service:  Pulmonary;;    Family History  Problem Relation Age of Onset   Arthritis Mother    Diabetes Mother    Heart disease Mother    Hyperlipidemia Mother    Hypertension Mother    Miscarriages / [place] Mother    Hearing loss Father    Liver cancer Father    Lung cancer Father    Hyperlipidemia Father    Diabetes Brother    Lung disease Maternal Grandfather        Black Lung   Diabetes Paternal Grandmother    Cancer Paternal Grandfather    Diabetes Paternal Grandfather     Allergies  Allergen Reactions   Codeine Itching    Current Outpatient Medications on File Prior to Visit  Medication Sig Dispense Refill   acetaminophen (TYLENOL) 500 MG tablet Take 500 mg by mouth every 6 (six) hours as needed for moderate pain.     adagrasib (KRAZATI) 200 MG tablet Take 3 tablets (600 mg total) by mouth 2 (two) times daily. 180 tablet 3   albuterol (VENTOLIN HFA) 108 (90 Base) MCG/ACT inhaler Inhale 2 puffs into the lungs every 6 (six) hours as needed for wheezing or shortness of breath. 8 g 5   apixaban (ELIQUIS) 2.5 MG TABS tablet Take 1 tablet (2.5 mg total) by mouth 2 (two) times daily. Start this treatment once you receive the targeted therapy with Caryn Section (Adagrasib).  Continue the other Eliquis 5 mg twice daily 60 tablet 2   Budeson-Glycopyrrol-Formoterol (BREZTRI AEROSPHERE) 160-9-4.8 MCG/ACT AERO Inhale 2 puffs into the lungs in the morning and at bedtime. 10.7 g 5   FeFum-FePoly-FA-B Cmp-C-Biot (INTEGRA PLUS) CAPS Take 1 capsule by mouth every morning. 30 capsule 2   folic acid (FOLVITE) 1 MG tablet TAKE 1 TABLET BY MOUTH DAILY 30 tablet 4   lidocaine-prilocaine (EMLA) cream Apply to the Port-A-Cath site 30 minutes before chemotherapy (Patient taking differently: Apply 1 Application topically daily as needed. Apply to the Port-A-Cath site 30 minutes before chemotherapy) 30 g 0   magnesium oxide (MAG-OX) 400 (240 Mg) MG tablet Take 400 mg by mouth daily.     nicotine (NICODERM CQ -  DOSED IN MG/24 HOURS) 14 mg/24hr patch 14 mg daily as needed (nicotene depedence).     omeprazole (PRILOSEC) 20 MG capsule Take 1 capsule (20 mg total) by mouth daily. 30 capsule 3   potassium chloride (KLOR-CON)  20 MEQ packet Take 40 mEq by mouth daily. 14 packet 0   prochlorperazine (COMPAZINE) 10 MG tablet Take 1 tablet (10 mg total) by mouth every 6 (six) hours as needed for nausea or vomiting. 30 tablet 0   SYMBICORT 160-4.5 MCG/ACT inhaler Inhale 2 puffs into the lungs in the morning and at bedtime.     No current facility-administered medications on file prior to visit.    BP 110/80   Pulse 87   Ht 5\' 3"  (1.6 m)   Wt 100 lb (45.4 kg)   SpO2 98%   BMI 17.71 kg/m       Objective:   Physical Exam Vitals and nursing note reviewed.  Constitutional:      Appearance: Normal appearance.  Cardiovascular:     Rate and Rhythm: Normal rate and regular rhythm.     Pulses: Normal pulses.     Heart sounds: Normal heart sounds.  Pulmonary:     Effort: Pulmonary effort is normal.     Breath sounds: Normal breath sounds.  Musculoskeletal:        General: Normal range of motion.  Skin:    General: Skin is warm and dry.  Neurological:     General: No focal deficit present.     Mental Status: She is alert and oriented to person, place, and time.  Psychiatric:        Mood and Affect: Mood normal.        Behavior: Behavior normal.        Thought Content: Thought content normal.        Judgment: Judgment normal.       Assessment & Plan:  1. COPD with acute exacerbation ([place]) -Reviewed imaging, labs, hospital notes, urgent instructions, and medication changes.  She has been seen by Dr. Earlie Server who drew labs, I do not see a reason to redraw these labs now as she does have a lab draw coming up next week. -Continue with DuoNeb and Breztri inhaler  2. Adenocarcinoma of right lung, stage 4 ([place]) - Per oncology   3. Anemia of chronic disease - Continue to monitor. Continue iron  supplement   4. Hypokalemia - on supplementation   5. Hypomagnesemia - On supplementation   6. Hyponatremia - Resolved  7. CKD (chronic kidney disease) stage 2, GFR 60-89 ml/min - Stable. Avoid nephrotoxic agents   8. History of pulmonary embolism - Continue with eliquis 2.5 mg BID   [name], NP  Time spent with patient today was 42 minutes which consisted of chart review, discussing diagnosis, work up, treatment answering questions and documentation.

## 2022-03-09 NOTE — Addendum Note (Signed)
Addended by: Nancy Fetter on: 03/09/2022 10:28 AM   Modules accepted: Level of Service

## 2022-03-21 ENCOUNTER — Inpatient Hospital Stay (HOSPITAL_BASED_OUTPATIENT_CLINIC_OR_DEPARTMENT_OTHER): Payer: Medicare Other | Admitting: Internal Medicine

## 2022-03-21 ENCOUNTER — Inpatient Hospital Stay: Payer: Medicare Other

## 2022-03-21 VITALS — BP 129/67 | HR 78 | Temp 97.9°F | Resp 16 | Ht 63.0 in | Wt 106.4 lb

## 2022-03-21 DIAGNOSIS — E876 Hypokalemia: Secondary | ICD-10-CM

## 2022-03-21 DIAGNOSIS — Z95828 Presence of other vascular implants and grafts: Secondary | ICD-10-CM

## 2022-03-21 DIAGNOSIS — C3491 Malignant neoplasm of unspecified part of right bronchus or lung: Secondary | ICD-10-CM

## 2022-03-21 DIAGNOSIS — C3411 Malignant neoplasm of upper lobe, right bronchus or lung: Secondary | ICD-10-CM | POA: Diagnosis not present

## 2022-03-21 LAB — CMP (CANCER CENTER ONLY)
ALT: 9 U/L (ref 0–44)
AST: 18 U/L (ref 15–41)
Albumin: 3.7 g/dL (ref 3.5–5.0)
Alkaline Phosphatase: 80 U/L (ref 38–126)
Anion gap: 7 (ref 5–15)
BUN: 19 mg/dL (ref 8–23)
CO2: 30 mmol/L (ref 22–32)
Calcium: 9.8 mg/dL (ref 8.9–10.3)
Chloride: 103 mmol/L (ref 98–111)
Creatinine: 1.52 mg/dL — ABNORMAL HIGH (ref 0.44–1.00)
GFR, Estimated: 38 mL/min — ABNORMAL LOW (ref >60)
Glucose, Bld: 101 mg/dL — ABNORMAL HIGH (ref 70–99)
Potassium: 4.3 mmol/L (ref 3.5–5.1)
Sodium: 140 mmol/L (ref 135–145)
Total Bilirubin: 0.4 mg/dL (ref 0.3–1.2)
Total Protein: 7.3 g/dL (ref 6.5–8.1)

## 2022-03-21 LAB — CBC WITH DIFFERENTIAL (CANCER CENTER ONLY)
Abs Immature Granulocytes: 0.3 10*3/uL — ABNORMAL HIGH (ref 0.00–0.07)
Basophils Absolute: 0.1 10*3/uL (ref 0.0–0.1)
Basophils Relative: 1 %
Eosinophils Absolute: 0.3 10*3/uL (ref 0.0–0.5)
Eosinophils Relative: 2 %
HCT: 28.3 % — ABNORMAL LOW (ref 36.0–46.0)
Hemoglobin: 9.6 g/dL — ABNORMAL LOW (ref 12.0–15.0)
Immature Granulocytes: 3 %
Lymphocytes Relative: 18 %
Lymphs Abs: 1.9 10*3/uL (ref 0.7–4.0)
MCH: 33.1 pg (ref 26.0–34.0)
MCHC: 33.9 g/dL (ref 30.0–36.0)
MCV: 97.6 fL (ref 80.0–100.0)
Monocytes Absolute: 1.3 10*3/uL — ABNORMAL HIGH (ref 0.1–1.0)
Monocytes Relative: 12 %
Neutro Abs: 7 10*3/uL (ref 1.7–7.7)
Neutrophils Relative %: 64 %
Platelet Count: 218 10*3/uL (ref 150–400)
RBC: 2.9 MIL/uL — ABNORMAL LOW (ref 3.87–5.11)
RDW: 17.6 % — ABNORMAL HIGH (ref 11.5–15.5)
WBC Count: 10.8 10*3/uL — ABNORMAL HIGH (ref 4.0–10.5)
nRBC: 0 % (ref 0.0–0.2)

## 2022-03-21 LAB — SAMPLE TO BLOOD BANK

## 2022-03-21 NOTE — Progress Notes (Signed)
Sue Chase [phone]   [phone]  PROGRESS NOTE FOR TELEMEDICINE VISITS  Sue Peng, NP [place] [address]  I connected withNAME@ on 03/21/22 at  3:15 PM EST by video enabled telemedicine visit and verified that I am speaking with the correct person using two identifiers.   I discussed the limitations, risks, security and privacy concerns of performing an evaluation and management service by telemedicine and the availability of in-person appointments. I also discussed with the patient that there may be a patient responsible charge related to this service. The patient expressed understanding and agreed to proceed.  Other persons participating in the visit and their role in the encounter:  None  Patient's location: [facility] [place] [place] exam room Provider's location: [place] office  DIAGNOSIS:  Stage IV (T2a, N0, M1a) non-small cell lung cancer, adenocarcinoma presented with multifocal bilateral pulmonary nodules involving the right upper lobe as well as the smaller bilateral groundglass opacities diagnosed in April 2023.  PD-L1 expression is 4%.  Molecular studies by [employer] [facility] tissue test showed positive KRAS G12C mutation but the blood test failed secondary to insufficient circulating tumor DNA.   PRIOR THERAPY: Systemic chemotherapy with carboplatin for AUC of 5, Alimta 500 Mg/M2 and Keytruda 200 Mg IV every 3 weeks.  First dose 08/10/2021.  Status post 8 cycles.  Starting from cycle #5 the patient is on maintenance treatment with Alimta and Keytruda every 3 weeks.  This treatment was discontinued secondary to disease progression   CURRENT THERAPY: Krazati (Adagrasib) 600 mg p.o. twice daily.  First dose started 03/08/2022  INTERVAL HISTORY: Sue Chase 67 y.o. female returns to the clinic today for follow-up visit.  The patient is feeling fine today with no concerning complaints except  for mild fatigue.  She started her treatment with Krazati (Adagrasib) 600 mg p.o. twice daily around 2 weeks ago and she has been tolerating it fairly well.  She had 1 or 2 episodes of nausea improved with antiemetics.  She has no diarrhea, abdominal pain or constipation.  She has no chest pain, shortness of breath, cough or hemoptysis.  She has no recent weight loss or night sweats.  She denied having any bleeding, bruises or ecchymosis.  MEDICAL HISTORY: Past Medical History:  Diagnosis Date   Colon polyps    Complication of anesthesia    tolerated propofol but other meds made her emotional / cry/ difficulty breathing/ panic   Hyperlipidemia    Rheumatic fever    Seasonal allergies    UTI (urinary tract infection)     ALLERGIES:  is allergic to codeine.  MEDICATIONS:  Current Outpatient Medications  Medication Sig Dispense Refill   acetaminophen (TYLENOL) 500 MG tablet Take 500 mg by mouth every 6 (six) hours as needed for moderate pain.     adagrasib (KRAZATI) 200 MG tablet Take 3 tablets (600 mg total) by mouth 2 (two) times daily. 180 tablet 3   albuterol (VENTOLIN HFA) 108 (90 Base) MCG/ACT inhaler Inhale 2 puffs into the lungs every 6 (six) hours as needed for wheezing or shortness of breath. 8 g 5   apixaban (ELIQUIS) 2.5 MG TABS tablet Take 1 tablet (2.5 mg total) by mouth 2 (two) times daily. Start this treatment once you receive the targeted therapy with Alfred Levins (Adagrasib).  Continue the other Eliquis 5 mg twice daily 60 tablet 2   Budeson-Glycopyrrol-Formoterol (BREZTRI AEROSPHERE) 160-9-4.8 MCG/ACT AERO Inhale 2 puffs into the lungs in  the morning and at bedtime. 10.7 g 5   FeFum-FePoly-FA-B Cmp-C-Biot (INTEGRA PLUS) CAPS Take 1 capsule by mouth every morning. 30 capsule 2   folic acid (FOLVITE) 1 MG tablet TAKE 1 TABLET BY MOUTH DAILY 30 tablet 4   lidocaine-prilocaine (EMLA) cream Apply to the Port-A-Cath site 30 minutes before chemotherapy (Patient taking differently: Apply  1 Application topically daily as needed. Apply to the Port-A-Cath site 30 minutes before chemotherapy) 30 g 0   magnesium oxide (MAG-OX) 400 (240 Mg) MG tablet Take 400 mg by mouth daily.     nicotine (NICODERM CQ - DOSED IN MG/24 HOURS) 14 mg/24hr patch 14 mg daily as needed (nicotene depedence).     omeprazole (PRILOSEC) 20 MG capsule Take 1 capsule (20 mg total) by mouth daily. 30 capsule 3   potassium chloride (KLOR-CON) 20 MEQ packet Take 40 mEq by mouth daily. 14 packet 0   prochlorperazine (COMPAZINE) 10 MG tablet Take 1 tablet (10 mg total) by mouth every 6 (six) hours as needed for nausea or vomiting. 30 tablet 0   SYMBICORT 160-4.5 MCG/ACT inhaler Inhale 2 puffs into the lungs in the morning and at bedtime.     No current facility-administered medications for this visit.    SURGICAL HISTORY:  Past Surgical History:  Procedure Laterality Date   BRONCHIAL BIOPSY  06/08/2021   Procedure: BRONCHIAL BIOPSIES;  Surgeon: Garner Nash, DO;  Location: [facility];  Service: Pulmonary;;   BRONCHIAL BRUSHINGS  06/08/2021   Procedure: BRONCHIAL BRUSHINGS;  Surgeon: Garner Nash, DO;  Location: [facility];  Service: Pulmonary;;   BRONCHIAL NEEDLE ASPIRATION BIOPSY  06/08/2021   Procedure: BRONCHIAL NEEDLE ASPIRATION BIOPSIES;  Surgeon: Garner Nash, DO;  Location: [facility];  Service: Pulmonary;;   COLONOSCOPY  2018   ERCP N/A 11/05/2021   Procedure: ENDOSCOPIC RETROGRADE CHOLANGIOPANCREATOGRAPHY (ERCP);  Surgeon: Irene Shipper, MD;  Location: [facility];  Service: Gastroenterology;  Laterality: N/A;   IR IMAGING GUIDED PORT INSERTION  08/12/2021   REMOVAL OF STONES  11/05/2021   Procedure: REMOVAL OF STONES;  Surgeon: Irene Shipper, MD;  Location: [facility];  Service: Gastroenterology;;   SALIVARY GLAND SURGERY     LATE 80S   SPHINCTEROTOMY  11/05/2021   Procedure: SPHINCTEROTOMY;  Surgeon: Irene Shipper, MD;  Location: [facility];  Service: Gastroenterology;;   TUBAL  LIGATION  1986   VIDEO BRONCHOSCOPY WITH RADIAL ENDOBRONCHIAL ULTRASOUND  06/08/2021   Procedure: VIDEO BRONCHOSCOPY WITH RADIAL ENDOBRONCHIAL ULTRASOUND;  Surgeon: Garner Nash, DO;  Location: [facility];  Service: Pulmonary;;    REVIEW OF SYSTEMS:  A comprehensive review of systems was negative except for: Constitutional: positive for fatigue   PHYSICAL EXAMINATION: General appearance: alert, cooperative, fatigued, and no distress Head: Normocephalic, without obvious abnormality, atraumatic Neck: no adenopathy, no JVD, supple, symmetrical, trachea midline, and thyroid not enlarged, symmetric, no tenderness/mass/nodules Lymph nodes: Cervical, supraclavicular, and axillary nodes normal. Resp: clear to auscultation bilaterally Back: symmetric, no curvature. ROM normal. No CVA tenderness. Cardio: regular rate and rhythm, S1, S2 normal, no murmur, click, rub or gallop GI: soft, non-tender; bowel sounds normal; no masses,  no organomegaly Extremities: extremities normal, atraumatic, no cyanosis or edema  ECOG PERFORMANCE STATUS: 1 - Symptomatic but completely ambulatory  Blood pressure 129/67, pulse 78, temperature 97.9 F (36.6 C), temperature source Temporal, resp. rate 16, height 5\' 3"  (1.6 m), weight 106 lb 6.4 oz (48.3 kg), SpO2 100 %.  LABORATORY DATA: Lab Results  Component Value  Date   WBC 10.8 (H) 03/21/2022   HGB 9.6 (L) 03/21/2022   HCT 28.3 (L) 03/21/2022   MCV 97.6 03/21/2022   PLT 218 03/21/2022      Chemistry      Component Value Date/Time   NA 139 03/02/2022 1359   K 3.0 (L) 03/02/2022 1359   CL 104 03/02/2022 1359   CO2 28 03/02/2022 1359   BUN 22 03/02/2022 1359   CREATININE 1.06 (H) 03/02/2022 1359      Component Value Date/Time   CALCIUM 8.5 (L) 03/02/2022 1359   ALKPHOS 64 03/02/2022 1359   AST 24 03/02/2022 1359   ALT 20 03/02/2022 1359   BILITOT 0.4 03/02/2022 1359       RADIOGRAPHIC STUDIES: ECHOCARDIOGRAM COMPLETE  Result Date:  02/22/2022    ECHOCARDIOGRAM REPORT   Patient Name:   [facility] [place] Date of Exam: 02/22/2022 Medical Rec #:  [id number]           Height:       63.0 in Accession #:    [id number]          Weight:       107.8 lb Date of Birth:  19-Oct-1955            BSA:          1.487 m Patient Age:    55 years            BP:           141/85 mmHg Patient Gender: F                   HR:           89 bpm. Exam Location:  Inpatient Procedure: 2D Echo, Cardiac Doppler and Color Doppler Indications:    R06.00 Dyspnea  History:        Patient has prior history of Echocardiogram examinations, most                 recent 02/19/2021. COPD; Risk Factors:Dyslipidemia and Current                 Smoker. Acute respiratory failure with hypoxia ([place]), Hx of                 Acute pulmonary embolism ([place]).  Sonographer:    Alvino Chapel RCS Referring Phys: [id number] [place]  1. Left ventricular ejection fraction, by estimation, is 60 to 65%. The left ventricle has normal function. The left ventricle has no regional wall motion abnormalities. Left ventricular diastolic parameters are consistent with Grade I diastolic dysfunction (impaired relaxation).  2. Right ventricular systolic function is normal. The right ventricular size is normal. There is normal pulmonary artery systolic pressure. The estimated right ventricular systolic pressure is 46.5 mmHg.  3. The mitral valve is normal in structure. No evidence of mitral valve regurgitation. No evidence of mitral stenosis.  4. The aortic valve is normal in structure. Aortic valve regurgitation is not visualized. No aortic stenosis is present.  5. The inferior vena cava is normal in size with greater than 50% respiratory variability, suggesting right atrial pressure of 3 mmHg. Comparison(s): No significant change from prior study. Prior images reviewed side by side. FINDINGS  Left Ventricle: Left ventricular ejection fraction, by estimation, is 60 to 65%. The left ventricle has normal  function. The left ventricle has no regional wall motion abnormalities. The left ventricular internal cavity size was normal in size. There is  no left ventricular hypertrophy. Left ventricular diastolic parameters are consistent with Grade I diastolic dysfunction (impaired relaxation). Indeterminate filling pressures. Right Ventricle: The right ventricular size is normal. No increase in right ventricular wall thickness. Right ventricular systolic function is normal. There is normal pulmonary artery systolic pressure. The tricuspid regurgitant velocity is 2.29 m/s, and  with an assumed right atrial pressure of 3 mmHg, the estimated right ventricular systolic pressure is 16.1 mmHg. Left Atrium: Left atrial size was normal in size. Right Atrium: Right atrial size was normal in size. Pericardium: There is no evidence of pericardial effusion. Mitral Valve: The mitral valve is normal in structure. No evidence of mitral valve regurgitation. No evidence of mitral valve stenosis. Tricuspid Valve: The tricuspid valve is normal in structure. Tricuspid valve regurgitation is not demonstrated. No evidence of tricuspid stenosis. Aortic Valve: The aortic valve is normal in structure. Aortic valve regurgitation is not visualized. No aortic stenosis is present. Pulmonic Valve: The pulmonic valve was normal in structure. Pulmonic valve regurgitation is not visualized. No evidence of pulmonic stenosis. Aorta: The aortic root is normal in size and structure. Venous: The inferior vena cava is normal in size with greater than 50% respiratory variability, suggesting right atrial pressure of 3 mmHg. IAS/Shunts: No atrial level shunt detected by color flow Doppler.  LEFT VENTRICLE PLAX 2D LVIDd:         4.40 cm   Diastology LVIDs:         2.70 cm   LV e' medial:    6.63 cm/s LV PW:         0.90 cm   LV E/e' medial:  13.3 LV IVS:        0.80 cm   LV e' lateral:   8.49 cm/s LVOT diam:     1.80 cm   LV E/e' lateral: 10.4 LV SV:         57 LV  SV Index:   38 LVOT Area:     2.54 cm  RIGHT VENTRICLE RV S prime:     18.40 cm/s TAPSE (M-mode): 2.4 cm LEFT ATRIUM             Index        RIGHT ATRIUM           Index LA diam:        3.00 cm 2.02 cm/m   RA Area:     14.70 cm LA Vol (A2C):   41.7 ml 28.04 ml/m  RA Volume:   38.80 ml  26.09 ml/m LA Vol (A4C):   37.9 ml 25.49 ml/m LA Biplane Vol: 40.7 ml 27.37 ml/m  AORTIC VALVE LVOT Vmax:   115.00 cm/s LVOT Vmean:  75.100 cm/s LVOT VTI:    0.224 m  AORTA Ao Root diam: 3.00 cm MITRAL VALVE                TRICUSPID VALVE MV Area (PHT): 2.69 cm     TR Peak grad:   21.0 mmHg MV Decel Time: 282 msec     TR Vmax:        229.00 cm/s MV E velocity: 88.30 cm/s MV A velocity: 103.00 cm/s  SHUNTS MV E/A ratio:  0.86         Systemic VTI:  0.22 m                             Systemic Diam: 1.80 cm Dani Gobble Croitoru MD  Electronically signed by Sanda Klein MD Signature Date/Time: 02/22/2022/4:05:23 PM    Final     ASSESSMENT AND PLAN: This is a very pleasant 67 years old white female diagnosed with stage IV (T2 a, N0, M1 a) non-small cell lung cancer, adenocarcinoma presented with multifocal bilateral pulmonary nodules involving the right upper lobe as well as the smaller bilateral groundglass opacities diagnosed in April 2023 with positive KRAS G12C mutation and PD-L1 expression of 4%. She underwent systemic chemotherapy with carboplatin for AUC of 5, pemetrexed 500 Mg/M2 and Keytruda 200 Mg IV every 3 weeks status post 8 cycles.  Starting from cycle #5 the patient is on maintenance treatment with Alimta and Keytruda every 3 weeks. The patient has been tolerating this treatment well with no concerning adverse effects except for fatigue. She had repeat CT scan of the chest during her recent hospitalization that showed concerning finding for disease progression with increase in the size of the loculated nodule in the posterior right upper lobe as well as increase in size of innumerable bilateral pleural nodule. I had  a lengthy discussion with the patient and her daughter today about her current condition and treatment options. I recommended for the patient to discontinue her current treatment with maintenance Alimta and Keytruda because of concern about disease progression. I discussed with her other treatment options including palliative care versus second line treatment with targeted therapy with Krazati (Adagrasib) 600 mg p.o. twice daily versus second line systemic chemotherapy with docetaxel and Cyramza.  She was interested in the treatment with Alfred Levins (Adagrasib). She started her treatment with Alfred Levins (Adagrasib) 600 mg p.o. twice daily on March 08, 2022, status post 2 weeks of treatment and has been tolerating it fairly well. I recommended for the patient to continue her treatment as planned.  Repeat CBC today showed persistent mild anemia.   Comprehensive metabolic panel showed normal liver enzymes but her serum creatinine is elevated.  I would encourage the patient to increase her oral hydration for now.  I will see her back for follow-up visit with repeat blood work in 2 weeks. I will consider repeating her imaging studies in around 6 weeks from now. The patient was advised to call immediately if she has any other concerning symptoms in the interval. I discussed the assessment and treatment plan with the patient. The patient was provided an opportunity to ask questions and all were answered. The patient agreed with the plan and demonstrated an understanding of the instructions.   The patient was advised to call back or seek an in-person evaluation if the symptoms worsen or if the condition fails to improve as anticipated.  I provided 20 minutes of face-to-face video visit time during this encounter, and > 50% was spent counseling as documented under my assessment & plan.  Eilleen Kempf, MD 03/21/2022 3:27 PM  Disclaimer: This note was dictated with voice recognition software. Similar sounding  words can inadvertently be transcribed and may not be corrected upon review.

## 2022-03-22 ENCOUNTER — Other Ambulatory Visit (HOSPITAL_COMMUNITY): Payer: Self-pay

## 2022-03-22 LAB — TSH: TSH: 2.108 u[IU]/mL (ref 0.350–4.500)

## 2022-03-23 ENCOUNTER — Other Ambulatory Visit (HOSPITAL_COMMUNITY): Payer: Self-pay

## 2022-03-25 ENCOUNTER — Other Ambulatory Visit (HOSPITAL_COMMUNITY): Payer: Self-pay

## 2022-03-28 ENCOUNTER — Telehealth: Payer: Self-pay

## 2022-03-28 NOTE — Telephone Encounter (Signed)
This nurse received a message from this patient stating that she started taking Krazati on 03/08/22.  She has been having some loose stools which she is managing with Imodium.  She states that 1-2 hours after taking Krazati she vomits.  Patient states that Compazine is not helping at all, even when she takes it before her dose of Krazati.  Patient states that she may be dehydrated because she is extremely thirsty. Forwarded to provider for recommendation.

## 2022-03-29 ENCOUNTER — Ambulatory Visit: Payer: Medicare Other | Admitting: Physician Assistant

## 2022-03-29 ENCOUNTER — Other Ambulatory Visit: Payer: Self-pay

## 2022-03-29 ENCOUNTER — Other Ambulatory Visit: Payer: Medicare Other

## 2022-03-29 ENCOUNTER — Other Ambulatory Visit (HOSPITAL_COMMUNITY): Payer: Self-pay

## 2022-03-29 ENCOUNTER — Encounter: Payer: Self-pay | Admitting: Internal Medicine

## 2022-03-29 ENCOUNTER — Inpatient Hospital Stay: Payer: Medicare Other | Attending: Oncology

## 2022-03-29 ENCOUNTER — Inpatient Hospital Stay: Payer: Medicare Other

## 2022-03-29 ENCOUNTER — Other Ambulatory Visit: Payer: Self-pay | Admitting: Physician Assistant

## 2022-03-29 ENCOUNTER — Inpatient Hospital Stay (HOSPITAL_BASED_OUTPATIENT_CLINIC_OR_DEPARTMENT_OTHER): Payer: Medicare Other | Admitting: Physician Assistant

## 2022-03-29 ENCOUNTER — Telehealth: Payer: Self-pay | Admitting: Internal Medicine

## 2022-03-29 ENCOUNTER — Telehealth: Payer: Self-pay

## 2022-03-29 ENCOUNTER — Ambulatory Visit: Payer: Medicare Other

## 2022-03-29 VITALS — BP 105/62 | HR 93 | Temp 99.0°F | Resp 16 | Wt 97.5 lb

## 2022-03-29 DIAGNOSIS — K5900 Constipation, unspecified: Secondary | ICD-10-CM | POA: Diagnosis not present

## 2022-03-29 DIAGNOSIS — F17210 Nicotine dependence, cigarettes, uncomplicated: Secondary | ICD-10-CM | POA: Diagnosis not present

## 2022-03-29 DIAGNOSIS — Z95828 Presence of other vascular implants and grafts: Secondary | ICD-10-CM

## 2022-03-29 DIAGNOSIS — C3411 Malignant neoplasm of upper lobe, right bronchus or lung: Secondary | ICD-10-CM | POA: Diagnosis present

## 2022-03-29 DIAGNOSIS — R531 Weakness: Secondary | ICD-10-CM | POA: Diagnosis not present

## 2022-03-29 DIAGNOSIS — J986 Disorders of diaphragm: Secondary | ICD-10-CM | POA: Diagnosis not present

## 2022-03-29 DIAGNOSIS — Z800 Family history of malignant neoplasm of digestive organs: Secondary | ICD-10-CM | POA: Insufficient documentation

## 2022-03-29 DIAGNOSIS — Z801 Family history of malignant neoplasm of trachea, bronchus and lung: Secondary | ICD-10-CM | POA: Diagnosis not present

## 2022-03-29 DIAGNOSIS — E8809 Other disorders of plasma-protein metabolism, not elsewhere classified: Secondary | ICD-10-CM | POA: Diagnosis not present

## 2022-03-29 DIAGNOSIS — R197 Diarrhea, unspecified: Secondary | ICD-10-CM | POA: Insufficient documentation

## 2022-03-29 DIAGNOSIS — R112 Nausea with vomiting, unspecified: Secondary | ICD-10-CM

## 2022-03-29 DIAGNOSIS — Z5111 Encounter for antineoplastic chemotherapy: Secondary | ICD-10-CM

## 2022-03-29 DIAGNOSIS — R200 Anesthesia of skin: Secondary | ICD-10-CM | POA: Diagnosis not present

## 2022-03-29 DIAGNOSIS — Z7901 Long term (current) use of anticoagulants: Secondary | ICD-10-CM | POA: Insufficient documentation

## 2022-03-29 DIAGNOSIS — M7989 Other specified soft tissue disorders: Secondary | ICD-10-CM | POA: Insufficient documentation

## 2022-03-29 DIAGNOSIS — C3491 Malignant neoplasm of unspecified part of right bronchus or lung: Secondary | ICD-10-CM

## 2022-03-29 DIAGNOSIS — E876 Hypokalemia: Secondary | ICD-10-CM

## 2022-03-29 LAB — COMPREHENSIVE METABOLIC PANEL
ALT: 40 U/L (ref 0–44)
AST: 77 U/L — ABNORMAL HIGH (ref 15–41)
Albumin: 3.3 g/dL — ABNORMAL LOW (ref 3.5–5.0)
Alkaline Phosphatase: 91 U/L (ref 38–126)
Anion gap: 10 (ref 5–15)
BUN: 29 mg/dL — ABNORMAL HIGH (ref 8–23)
CO2: 27 mmol/L (ref 22–32)
Calcium: 8.6 mg/dL — ABNORMAL LOW (ref 8.9–10.3)
Chloride: 92 mmol/L — ABNORMAL LOW (ref 98–111)
Creatinine, Ser: 1.71 mg/dL — ABNORMAL HIGH (ref 0.44–1.00)
GFR, Estimated: 33 mL/min — ABNORMAL LOW (ref >60)
Glucose, Bld: 98 mg/dL (ref 70–99)
Potassium: 3.2 mmol/L — ABNORMAL LOW (ref 3.5–5.1)
Sodium: 129 mmol/L — ABNORMAL LOW (ref 135–145)
Total Bilirubin: 0.8 mg/dL (ref 0.3–1.2)
Total Protein: 6.7 g/dL (ref 6.5–8.1)

## 2022-03-29 LAB — CBC WITH DIFFERENTIAL/PLATELET
Abs Immature Granulocytes: 0.05 10*3/uL (ref 0.00–0.07)
Basophils Absolute: 0 10*3/uL (ref 0.0–0.1)
Basophils Relative: 0 %
Eosinophils Absolute: 0.1 10*3/uL (ref 0.0–0.5)
Eosinophils Relative: 1 %
HCT: 24.3 % — ABNORMAL LOW (ref 36.0–46.0)
Hemoglobin: 8.7 g/dL — ABNORMAL LOW (ref 12.0–15.0)
Immature Granulocytes: 1 %
Lymphocytes Relative: 14 %
Lymphs Abs: 0.9 10*3/uL (ref 0.7–4.0)
MCH: 32.2 pg (ref 26.0–34.0)
MCHC: 35.8 g/dL (ref 30.0–36.0)
MCV: 90 fL (ref 80.0–100.0)
Monocytes Absolute: 0.6 10*3/uL (ref 0.1–1.0)
Monocytes Relative: 10 %
Neutro Abs: 4.5 10*3/uL (ref 1.7–7.7)
Neutrophils Relative %: 74 %
Platelets: 92 10*3/uL — ABNORMAL LOW (ref 150–400)
RBC: 2.7 MIL/uL — ABNORMAL LOW (ref 3.87–5.11)
RDW: 16.8 % — ABNORMAL HIGH (ref 11.5–15.5)
WBC: 6.1 10*3/uL (ref 4.0–10.5)
nRBC: 0 % (ref 0.0–0.2)

## 2022-03-29 LAB — MAGNESIUM: Magnesium: 1.5 mg/dL — ABNORMAL LOW (ref 1.7–2.4)

## 2022-03-29 LAB — SAMPLE TO BLOOD BANK

## 2022-03-29 MED ORDER — MAGNESIUM OXIDE -MG SUPPLEMENT 400 (240 MG) MG PO TABS
400.0000 mg | ORAL_TABLET | Freq: Every day | ORAL | 0 refills | Status: DC
  Filled 2022-03-29: qty 7, 7d supply, fill #0

## 2022-03-29 MED ORDER — SODIUM CHLORIDE 0.9% FLUSH
10.0000 mL | Freq: Once | INTRAVENOUS | Status: AC
  Administered 2022-03-29: 10 mL

## 2022-03-29 MED ORDER — ONDANSETRON 4 MG PO TBDP
4.0000 mg | ORAL_TABLET | Freq: Three times a day (TID) | ORAL | 0 refills | Status: DC | PRN
  Filled 2022-03-29 (×2): qty 20, 7d supply, fill #0

## 2022-03-29 MED ORDER — POTASSIUM CHLORIDE 10 MEQ/100ML IV SOLN
10.0000 meq | Freq: Once | INTRAVENOUS | Status: AC
  Administered 2022-03-29: 10 meq via INTRAVENOUS
  Filled 2022-03-29: qty 100

## 2022-03-29 MED ORDER — ONDANSETRON HCL 4 MG/2ML IJ SOLN
4.0000 mg | Freq: Once | INTRAMUSCULAR | Status: AC
  Administered 2022-03-29: 4 mg via INTRAVENOUS
  Filled 2022-03-29: qty 2

## 2022-03-29 MED ORDER — POTASSIUM CHLORIDE CRYS ER 20 MEQ PO TBCR
20.0000 meq | EXTENDED_RELEASE_TABLET | Freq: Two times a day (BID) | ORAL | 0 refills | Status: DC
  Filled 2022-03-29 (×2): qty 14, 7d supply, fill #0

## 2022-03-29 MED ORDER — HEPARIN SOD (PORK) LOCK FLUSH 100 UNIT/ML IV SOLN
500.0000 [IU] | Freq: Once | INTRAVENOUS | Status: AC
  Administered 2022-03-29: 500 [IU]

## 2022-03-29 MED ORDER — SODIUM CHLORIDE 0.9 % IV SOLN: Freq: Once | INTRAVENOUS | Status: AC

## 2022-03-29 NOTE — Progress Notes (Signed)
Symptom Management Consult note [place]    Patient Care Team: [name], NP as PCP - General (Family Medicine)    Name / MRN / DOB: Sue Chase  [id number]  1955/08/29   Date of visit: 03/29/2022   Chief Complaint/Reason for visit: diarrhea and vomiting   Current Therapy: Recently started Krazati 600 mg BID on 03/08/2022     ASSESSMENT & PLAN: Patient is a 67 y.o. female  with oncologic history of Stage IV (T2a, N0, M1a) non-small cell lung cancer, adenocarcinoma  followed by Dr. Julien Nordmann.  I have viewed most recent oncology note and lab work.   #Symptom management: Nausea w/ vomiting, Diarrhea, weight loss -Patient newly on [place] which has adverse effect of diarrhea 69-70% and vomiting 56-57%. -Patient nontoxic appearing. Clinically appears dehydrated. Benign abdominal exam CBC without leukocytosis, hemoglobin and platelets are trending down at 8.7 and 92k. Patient is on eliquis and has increased bruising. No intervention is needed at this time, however if thrombocytopenia worsens she will need to hold eliquis. CMP with multiple electrolyte derangements likely from GI losses including Na 129, K 3.2. Patient given 1 run of PO potassium in clinic and prescription for short course of PO. Magnesium also slightly low at 1.5, PO replacement prescribed as well. CMP is also showing increased BUN/Cr 29/1.71 compared to 19/1.52 x 8 days ago likely related to poor PO intake. Liver enzymes also show AST is increased at 71 which could be related to [facility].  -Patient given liter of IVF, zofran, and pepcid in clinic. On reassessment she is tolerating PO intake. Prescription sent for zofran as compazine not controlling nausea at home. Patient encouraged to try Boost or Ensure to help with weight loss. -With lab abnormalities patient will need close monitoring with oncologist.  At this time symptoms are considered Grade 1. I discussed plan with Dr. Julien Nordmann and he  recommends patient continue Mel Almond without dose reduction at this time. Patient agreeable with plan. -Stool studies were ordered however patient did not have bowel movement while here in clinic.  Patient will try to collect sample at home. -Strict ED precautions discussed should symptoms worsen.  #Stage IV (T2a, N0, M1a) non-small cell lung cancer, adenocarcinoma  -Recent change in therapy due to disease progression - Next appointment with oncologist is 04/05/22  Heme/Onc History: Oncology History  Adenocarcinoma of right lung, stage 4 ([place])  05/2021 Initial Diagnosis   Stage IV (T2a, N0, M1a) non-small cell lung cancer, adenocarcinoma presented with multifocal bilateral pulmonary nodules involving the right upper lobe as well as the smaller bilateral groundglass opacities diagnosed in April 2023.  PD-L1 expression is 4%.  Molecular studies by [employer] 360 tissue test showed positive KRAS G12C mutation but the blood test failed secondary to insufficient circulating tumor DNA.   07/06/2021 Cancer Staging   Staging form: Lung, AJCC 8th Edition - Clinical: Stage IVA (cT2a, cN0, cM1a) - Signed by Curt Bears, MD on 07/06/2021   08/10/2021 - 02/15/2022 Chemotherapy   Patient is on Treatment Plan : LUNG Carboplatin (5) + Pemetrexed (500) + Pembrolizumab (200) D1 q21d Induction x 4 cycles / Maintenance Pemetrexed (500) + Pembrolizumab (200) D1 q21d         Interval history-: Sue Chase is a 67 y.o. female with oncologic history as above presenting to [facility] today with chief complaint of nausea vomiting and diarrhea x 6 days.  Patient presents unaccompanied to clinic.  Patient states she started taking Kazrati on 03/08/22.  For the first 2 weeks that she was tolerating medication without any side effects.  Unfortunately she then developed persistent nausea and diarrhea.  She has had approximately 1 episode of diarrhea per day and 2-3 episodes of vomiting.  After her diarrhea which she  describes as a liquid stool she will take an Imodium and diarrhea will resolve for the next 24 hours.  She has been taking 1 Imodium daily.  For nausea she has Compazine at home which has not been able to control her symptoms.  She last took a dose yesterday afternoon.  She has not taken any over-the-counter medications today.  She has not had any fever, chills or abdominal pain.  She also reports increased bruising in the last 2 weeks with 2 bruises on her arms.  She denies any bleeding while brushing her teeth, denies any hematemesis or hematochezia.      ROS  All other systems are reviewed and are negative for acute change except as noted in the HPI.    Allergies  Allergen Reactions   Codeine Itching     Past Medical History:  Diagnosis Date   Colon polyps    Complication of anesthesia    tolerated propofol but other meds made her emotional / cry/ difficulty breathing/ panic   Hyperlipidemia    Rheumatic fever    Seasonal allergies    UTI (urinary tract infection)      Past Surgical History:  Procedure Laterality Date   BRONCHIAL BIOPSY  06/08/2021   Procedure: BRONCHIAL BIOPSIES;  Surgeon: Garner Nash, DO;  Location: [place] [facility];  Service: Pulmonary;;   BRONCHIAL BRUSHINGS  06/08/2021   Procedure: BRONCHIAL BRUSHINGS;  Surgeon: Garner Nash, DO;  Location: [place] [facility];  Service: Pulmonary;;   BRONCHIAL NEEDLE ASPIRATION BIOPSY  06/08/2021   Procedure: BRONCHIAL NEEDLE ASPIRATION BIOPSIES;  Surgeon: Garner Nash, DO;  Location: [place] [facility];  Service: Pulmonary;;   COLONOSCOPY  2018   ERCP N/A 11/05/2021   Procedure: ENDOSCOPIC RETROGRADE CHOLANGIOPANCREATOGRAPHY (ERCP);  Surgeon: Irene Shipper, MD;  Location: [facility];  Service: Gastroenterology;  Laterality: N/A;   IR IMAGING GUIDED PORT INSERTION  08/12/2021   REMOVAL OF STONES  11/05/2021   Procedure: REMOVAL OF STONES;  Surgeon: Irene Shipper, MD;  Location: [facility];  Service: Gastroenterology;;    SALIVARY GLAND SURGERY     LATE 80S   SPHINCTEROTOMY  11/05/2021   Procedure: SPHINCTEROTOMY;  Surgeon: Irene Shipper, MD;  Location: [facility];  Service: Gastroenterology;;   TUBAL LIGATION  1986   VIDEO BRONCHOSCOPY WITH RADIAL ENDOBRONCHIAL ULTRASOUND  06/08/2021   Procedure: VIDEO BRONCHOSCOPY WITH RADIAL ENDOBRONCHIAL ULTRASOUND;  Surgeon: Garner Nash, DO;  Location: [place] [facility];  Service: Pulmonary;;    Social History   Socioeconomic History   Marital status: Widowed    Spouse name: Not on file   Number of children: Not on file   Years of education: Not on file   Highest education level: Some college, no degree  Occupational History   Not on file  Tobacco Use   Smoking status: Some Days    Packs/day: 1.00    Years: 40.00    Total pack years: 40.00    Types: Cigarettes    Passive exposure: Never   Smokeless tobacco: Never   Tobacco comments:    1/2 ppd or less 06/15/21. Hsm/.   Vaping Use   Vaping Use: Never used  Substance and Sexual Activity   Alcohol use: Yes  Comment: Rum and Coke/Unsure of amount   Drug use: Never   Sexual activity: Not on file  Other Topics Concern   Not on file  Social History Narrative   Married    Two grown children    She enjoys reading, walking   Social Determinants of Health   Financial Resource Strain: Medium Risk (10/18/2021)   Overall Financial Resource Strain (CARDIA)    Difficulty of Paying Living Expenses: Somewhat hard  Food Insecurity: No Food Insecurity (02/28/2022)   Hunger Vital Sign    Worried About Running Out of Food in the Last Year: Never true    Ran Out of Food in the Last Year: Never true  Transportation Needs: No Transportation Needs (02/28/2022)   PRAPARE - Hydrologist (Medical): No    Lack of Transportation (Non-Medical): No  Physical Activity: Unknown (10/18/2021)   Exercise Vital Sign    Days of Exercise per Week: Patient refused    Minutes of Exercise per Session: Not  on file  Stress: No Stress Concern Present (10/18/2021)   [place]    Feeling of Stress : Only a little  Social Connections: Unknown (10/18/2021)   Social Connection and Isolation Panel [NHANES]    Frequency of Communication with Friends and Family: Three times a week    Frequency of Social Gatherings with Friends and Family: Patient refused    Attends Religious Services: Patient refused    Active Member of Clubs or Organizations: No    Attends Music therapist: Not on file    Marital Status: Widowed  Intimate Partner Violence: Not At Risk (02/18/2022)   Humiliation, Afraid, Rape, and Kick questionnaire    Fear of Current or Ex-Partner: No    Emotionally Abused: No    Physically Abused: No    Sexually Abused: No    Family History  Problem Relation Age of Onset   Arthritis Mother    Diabetes Mother    Heart disease Mother    Hyperlipidemia Mother    Hypertension Mother    Miscarriages / [place] Mother    Hearing loss Father    Liver cancer Father    Lung cancer Father    Hyperlipidemia Father    Diabetes Brother    Lung disease Maternal Grandfather        Black Lung   Diabetes Paternal Grandmother    Cancer Paternal Grandfather    Diabetes Paternal Grandfather      Current Outpatient Medications:    magnesium oxide (MAG-OX) 400 (240 Mg) MG tablet, Take 1 tablet (400 mg total) by mouth daily for 7 days., Disp: 7 tablet, Rfl: 0   ondansetron (ZOFRAN-ODT) 4 MG disintegrating tablet, Dissolve 1 tablet (4 mg total) by mouth every 8 (eight) hours as needed for nausea or vomiting., Disp: 20 tablet, Rfl: 0   potassium chloride SA (KLOR-CON M) 20 MEQ tablet, Take 1 tablet (20 mEq total) by mouth 2 (two) times daily for 7 days., Disp: 14 tablet, Rfl: 0   acetaminophen (TYLENOL) 500 MG tablet, Take 500 mg by mouth every 6 (six) hours as needed for moderate pain., Disp: , Rfl:    adagrasib (KRAZATI)  200 MG tablet, Take 3 tablets (600 mg total) by mouth 2 (two) times daily., Disp: 180 tablet, Rfl: 3   albuterol (VENTOLIN HFA) 108 (90 Base) MCG/ACT inhaler, Inhale 2 puffs into the lungs every 6 (six) hours as needed for wheezing or  shortness of breath., Disp: 8 g, Rfl: 5   apixaban (ELIQUIS) 2.5 MG TABS tablet, Take 1 tablet (2.5 mg total) by mouth 2 (two) times daily. Start this treatment once you receive the targeted therapy with Alfred Levins (Adagrasib).  Continue the other Eliquis 5 mg twice daily, Disp: 60 tablet, Rfl: 2   Budeson-Glycopyrrol-Formoterol (BREZTRI AEROSPHERE) 160-9-4.8 MCG/ACT AERO, Inhale 2 puffs into the lungs in the morning and at bedtime., Disp: 10.7 g, Rfl: 5   FeFum-FePoly-FA-B Cmp-C-Biot (INTEGRA PLUS) CAPS, Take 1 capsule by mouth every morning., Disp: 30 capsule, Rfl: 2   folic acid (FOLVITE) 1 MG tablet, TAKE 1 TABLET BY MOUTH DAILY, Disp: 30 tablet, Rfl: 4   lidocaine-prilocaine (EMLA) cream, Apply to the Port-A-Cath site 30 minutes before chemotherapy (Patient taking differently: Apply 1 Application topically daily as needed. Apply to the Port-A-Cath site 30 minutes before chemotherapy), Disp: 30 g, Rfl: 0   nicotine (NICODERM CQ - DOSED IN MG/24 HOURS) 14 mg/24hr patch, 14 mg daily as needed (nicotene depedence)., Disp: , Rfl:    omeprazole (PRILOSEC) 20 MG capsule, Take 1 capsule (20 mg total) by mouth daily., Disp: 30 capsule, Rfl: 3   prochlorperazine (COMPAZINE) 10 MG tablet, Take 1 tablet (10 mg total) by mouth every 6 (six) hours as needed for nausea or vomiting., Disp: 30 tablet, Rfl: 0   SYMBICORT 160-4.5 MCG/ACT inhaler, Inhale 2 puffs into the lungs in the morning and at bedtime., Disp: , Rfl:   PHYSICAL EXAM: ECOG FS:1 - Symptomatic but completely ambulatory    Vitals:   03/29/22 1241 03/29/22 1508  BP: (!) 103/59 105/62  Pulse: 92 93  Resp: 18 16  Temp: 99 F (37.2 C)   TempSrc: Oral   SpO2: 99% 97%  Weight: 97 lb 8 oz (44.2 kg)    Physical  Exam Vitals and nursing note reviewed.  Constitutional:      Appearance: She is not ill-appearing or toxic-appearing.     Comments: Thin appearing female  HENT:     Head: Normocephalic.     Nose: Nose normal.     Mouth/Throat:     Mouth: Mucous membranes are dry.  Eyes:     Conjunctiva/sclera: Conjunctivae normal.  Neck:     Vascular: No JVD.  Cardiovascular:     Rate and Rhythm: Normal rate and regular rhythm.     Pulses: Normal pulses.     Heart sounds: Normal heart sounds.  Pulmonary:     Effort: Pulmonary effort is normal.     Breath sounds: Normal breath sounds.  Abdominal:     General: Bowel sounds are normal. There is no distension.     Palpations: Abdomen is soft. There is no mass.     Tenderness: There is no abdominal tenderness. There is no right CVA tenderness, left CVA tenderness, guarding or rebound.     Hernia: No hernia is present.  Musculoskeletal:     Cervical back: Normal range of motion.  Skin:    General: Skin is warm and dry.     Comments: Nickel sized area of ecchymosis on right forearm  Neurological:     Mental Status: She is oriented to person, place, and time.        LABORATORY DATA: I have reviewed the data as listed    Latest Ref Rng & Units 03/29/2022   11:54 AM 03/21/2022    2:46 PM 03/02/2022    1:59 PM  CBC  WBC 4.0 - 10.5 K/uL 6.1  10.8  12.4   Hemoglobin 12.0 - 15.0 g/dL 8.7  9.6  11.0   Hematocrit 36.0 - 46.0 % 24.3  28.3  32.1   Platelets 150 - 400 K/uL 92  218  109         Latest Ref Rng & Units 03/29/2022   11:54 AM 03/21/2022    2:46 PM 03/02/2022    1:59 PM  CMP  Glucose 70 - 99 mg/dL 98  101  99   BUN 8 - 23 mg/dL 29  19  22    Creatinine 0.44 - 1.00 mg/dL 1.71  1.52  1.06   Sodium 135 - 145 mmol/L 129  140  139   Potassium 3.5 - 5.1 mmol/L 3.2  4.3  3.0   Chloride 98 - 111 mmol/L 92  103  104   CO2 22 - 32 mmol/L 27  30  28    Calcium 8.9 - 10.3 mg/dL 8.6  9.8  8.5   Total Protein 6.5 - 8.1 g/dL 6.7  7.3  6.8   Total  Bilirubin 0.3 - 1.2 mg/dL 0.8  0.4  0.4   Alkaline Phos 38 - 126 U/L 91  80  64   AST 15 - 41 U/L 77  18  24   ALT 0 - 44 U/L 40  9  20        RADIOGRAPHIC STUDIES (from last 24 hours if applicable) I have personally reviewed the radiological images as listed and agreed with the findings in the report. No results found.      Visit Diagnosis: 1. Adenocarcinoma of right lung, stage 4 ([place])   2. Nausea and vomiting, unspecified vomiting type   3. Hypokalemia   4. Port-A-Cath in place   5. Diarrhea, unspecified type      No orders of the defined types were placed in this encounter.   All questions were answered. The patient knows to call the clinic with any problems, questions or concerns. No barriers to learning was detected.  I have spent a total of 30 minutes minutes of face-to-face and non-face-to-face time, preparing to see the patient, obtaining and/or reviewing separately obtained history, performing a medically appropriate examination, counseling and educating the patient, ordering tests, documenting clinical information in the electronic health record, and care coordination (communications with other health care professionals or caregivers).    Thank you for allowing me to participate in the care of this patient.    Barrie Folk, PA-C Department of Hematology/Oncology [facility] Phone: [phone]  [phone]    03/29/2022 3:24 PM

## 2022-03-29 NOTE — Telephone Encounter (Signed)
This nurse reached out to patient related to her symptoms.  Offered her an appointment to come see and symptom management.  Patient accepted appointment and made this nurse aware that her port has not been flushed in a while.  This nurse advised that she will need to be here for port flush with labs at 1145 this morning and then see symptom management following that.  She acknowledged understanding.  No further concerns noted at this time.

## 2022-03-29 NOTE — Patient Instructions (Signed)

## 2022-03-29 NOTE — Telephone Encounter (Signed)
Called patient regarding upcoming February appointments, patient is notified.

## 2022-03-30 ENCOUNTER — Other Ambulatory Visit: Payer: Self-pay

## 2022-03-30 ENCOUNTER — Other Ambulatory Visit (HOSPITAL_COMMUNITY): Payer: Self-pay

## 2022-03-31 ENCOUNTER — Other Ambulatory Visit (HOSPITAL_COMMUNITY): Payer: Self-pay

## 2022-04-01 ENCOUNTER — Other Ambulatory Visit (HOSPITAL_COMMUNITY): Payer: Self-pay

## 2022-04-02 ENCOUNTER — Other Ambulatory Visit: Payer: Self-pay | Admitting: Physician Assistant

## 2022-04-02 DIAGNOSIS — C3491 Malignant neoplasm of unspecified part of right bronchus or lung: Secondary | ICD-10-CM

## 2022-04-02 NOTE — Progress Notes (Unsigned)
[facility] OFFICE PROGRESS NOTE  [name], NP [name] [address]  DIAGNOSIS: Stage IV (T2a, N0, M1a) non-small cell lung cancer, adenocarcinoma presented with multifocal bilateral pulmonary nodules involving the right upper lobe as well as the smaller bilateral groundglass opacities diagnosed in April 2023.  PD-L1 expression is 4%.   Molecular studies by [employer] tissue test showed positive KRAS G12C mutation but the blood test failed secondary to insufficient circulating tumor DNA.  PRIOR THERAPY: Systemic chemotherapy with carboplatin for AUC of 5, Alimta 500 Mg/M2 and Keytruda 200 Mg IV every 3 weeks.  First dose 08/10/2021.  Status post 8 cycles.  Starting from cycle #5 the patient is on maintenance treatment with Alimta and Keytruda every 3 weeks.  This treatment was discontinued secondary to disease progression   CURRENT THERAPY: Krazati (Adagrasib) 600 mg p.o. twice daily.  First dose started 03/08/2022. Status post about 1 month of treatment. Will reduce dose to 400 mg BID starting from today 04/05/22 due to intolerance   INTERVAL HISTORY: Sue Chase 67 y.o. female returns to the clinic today for a follow-up visit accompanied by her daughter.  The patient was recently found to have evidence of disease progression with increased number of bilateral pulmonary nodules in early January 2024.  Therefore, her treatment was switched to targeted treatment with Sue Chase which she started on 03/08/2022.  She tolerated this well for the first 2 weeks of treatment and then developed nausea, vomiting, diarrhea, and weight loss recently was seen in symptom management clinic last week on 2/6 for these concerns.  She received Zofran at home in addition to use with her Compazine for nausea and vomiting.  She received IV fluids.  She also had some electrolyte derangements which were replaced.  She was prescribed magnesium supplements but didn't know that  she was supposed to take these and did not pick them up. She uses Imodium for diarrhea.  Since being seen in the symptom management clinic, her symptoms of nausea/vomiting are under better control with taking Zofran.  Her diarrhea is also improved with taking Imodium.  However her fatigue, generalized weakness, and poor appetite has not improved.  Patient is more anemic on labs.  Patient is no longer taking Integra plus based on her most recent iron studies.  The patient denies any abnormal bleeding or bruising including epistaxis, gingival bleeding, hemoptysis, hematemesis, melena, or hematochezia.  Of note she is on Eliquis.   Guarding her appetite, the patient already previously saw member the nutritionist team.  It appears she lost 10 pounds since last being seen.  The patient reports taste alterations, nausea, and decreased appetite altogether contributing to her weight loss.  She would be interested in an appetite stimulant.  The patient is also hypotensive.  She does not take any antihypertensives.  She does not have a blood pressure cuff at home.  Denies any fevers.  She reports baseline cold intolerance.  She reports her breathing is "okay" and denies any changes with her dyspnea on exertion.  Denies any significant cough, chest pain, or hemoptysis.  She is here today for evaluation and repeat blood work and to manage her adverse side effects of treatment.Marland Kitchen    MEDICAL HISTORY: Past Medical History:  Diagnosis Date   Colon polyps    Complication of anesthesia    tolerated propofol but other meds made her emotional / cry/ difficulty breathing/ panic   Hyperlipidemia    Rheumatic fever  Seasonal allergies    UTI (urinary tract infection)     ALLERGIES:  is allergic to codeine.  MEDICATIONS:  Current Outpatient Medications  Medication Sig Dispense Refill   acetaminophen (TYLENOL) 500 MG tablet Take 500 mg by mouth every 6 (six) hours as needed for moderate pain.     adagrasib  (KRAZATI) 200 MG tablet Take 3 tablets (600 mg total) by mouth 2 (two) times daily. 180 tablet 3   albuterol (VENTOLIN HFA) 108 (90 Base) MCG/ACT inhaler Inhale 2 puffs into the lungs every 6 (six) hours as needed for wheezing or shortness of breath. 8 g 5   apixaban (ELIQUIS) 2.5 MG TABS tablet Take 1 tablet (2.5 mg total) by mouth 2 (two) times daily. Start this treatment once you receive the targeted therapy with Sue Chase (Adagrasib).  Continue the other Eliquis 5 mg twice daily 60 tablet 2   Budeson-Glycopyrrol-Formoterol (BREZTRI AEROSPHERE) 160-9-4.8 MCG/ACT AERO Inhale 2 puffs into the lungs in the morning and at bedtime. 10.7 g 5   lidocaine-prilocaine (EMLA) cream Apply to the Port-A-Cath site 30 minutes before chemotherapy (Patient taking differently: Apply 1 Application topically daily as needed. Apply to the Port-A-Cath site 30 minutes before chemotherapy) 30 g 0   megestrol (MEGACE) 20 MG tablet Take 1 tablet (20 mg total) by mouth daily. 30 tablet 2   nicotine (NICODERM CQ - DOSED IN MG/24 HOURS) 14 mg/24hr patch 14 mg daily as needed (nicotene depedence).     omeprazole (PRILOSEC) 20 MG capsule Take 1 capsule (20 mg total) by mouth daily. 30 capsule 3   ondansetron (ZOFRAN-ODT) 4 MG disintegrating tablet Dissolve 1 tablet (4 mg total) by mouth every 8 (eight) hours as needed for nausea or vomiting. 20 tablet 0   potassium chloride SA (KLOR-CON M) 20 MEQ tablet Take 1 tablet (20 mEq total) by mouth 2 (two) times daily for 7 days. 14 tablet 0   prochlorperazine (COMPAZINE) 10 MG tablet Take 1 tablet (10 mg total) by mouth every 6 (six) hours as needed for nausea or vomiting. 30 tablet 0   SYMBICORT 160-4.5 MCG/ACT inhaler Inhale 2 puffs into the lungs in the morning and at bedtime.     FeFum-FePoly-FA-B Cmp-C-Biot (INTEGRA PLUS) CAPS Take 1 capsule by mouth every morning. 30 capsule 2   magnesium oxide (MAG-OX) 400 (240 Mg) MG tablet Take 1 tablet (400 mg total) by mouth daily. 30 tablet 0    No current facility-administered medications for this visit.    SURGICAL HISTORY:  Past Surgical History:  Procedure Laterality Date   BRONCHIAL BIOPSY  06/08/2021   Procedure: BRONCHIAL BIOPSIES;  Surgeon: Garner Nash, DO;  Location: [facility];  Service: Pulmonary;;   BRONCHIAL BRUSHINGS  06/08/2021   Procedure: BRONCHIAL BRUSHINGS;  Surgeon: Garner Nash, DO;  Location: [facility];  Service: Pulmonary;;   BRONCHIAL NEEDLE ASPIRATION BIOPSY  06/08/2021   Procedure: BRONCHIAL NEEDLE ASPIRATION BIOPSIES;  Surgeon: Garner Nash, DO;  Location: [facility];  Service: Pulmonary;;   COLONOSCOPY  2018   ERCP N/A 11/05/2021   Procedure: ENDOSCOPIC RETROGRADE CHOLANGIOPANCREATOGRAPHY (ERCP);  Surgeon: Irene Shipper, MD;  Location: [facility];  Service: Gastroenterology;  Laterality: N/A;   IR IMAGING GUIDED PORT INSERTION  08/12/2021   REMOVAL OF STONES  11/05/2021   Procedure: REMOVAL OF STONES;  Surgeon: Irene Shipper, MD;  Location: [facility];  Service: Gastroenterology;;   SALIVARY GLAND SURGERY     LATE 80S   SPHINCTEROTOMY  11/05/2021   Procedure:  SPHINCTEROTOMY;  Surgeon: Irene Shipper, MD;  Location: [facility];  Service: Gastroenterology;;   TUBAL LIGATION  1986   VIDEO BRONCHOSCOPY WITH RADIAL ENDOBRONCHIAL ULTRASOUND  06/08/2021   Procedure: VIDEO BRONCHOSCOPY WITH RADIAL ENDOBRONCHIAL ULTRASOUND;  Surgeon: Garner Nash, DO;  Location: [facility];  Service: Pulmonary;;    REVIEW OF SYSTEMS:   Review of Systems  Constitutional: Positive for decreased appetite, weight loss, fatigue, generalized weakness. Negative for chills and fever.  HENT:   Negative for mouth sores, nosebleeds, sore throat and trouble swallowing.   Eyes: Negative for eye problems and icterus.  Respiratory: Positive for baseline dyspnea on exertion. Negative for cough, hemoptysis, and wheezing.   Cardiovascular: Negative for chest pain and leg swelling.  Gastrointestinal: Positive for  nausea/vomiting  (improved compared to prior although she had an episode of vomiting and nausea this morning ).  Positive for intermittent diarrhea controlled with Imodium.  Negative for abdominal pain and constipation.  Genitourinary: Negative for bladder incontinence, difficulty urinating, dysuria, frequency and hematuria.   Musculoskeletal: Negative for back pain, gait problem, neck pain and neck stiffness.  Skin: Negative for itching and rash.  Neurological: Negative for dizziness, extremity weakness, gait problem, headaches, light-headedness and seizures.  Hematological: Negative for adenopathy. Does not bruise/bleed easily.  Psychiatric/Behavioral: Negative for confusion, depression and sleep disturbance. The patient is not nervous/anxious.     PHYSICAL EXAMINATION:  Blood pressure 92/60, pulse 95, temperature 97.6 F (36.4 C), temperature source Oral, resp. rate 18, height 5\' 3"  (1.6 m), weight 95 lb 1.6 oz (43.1 kg), SpO2 100 %.  ECOG PERFORMANCE STATUS: 1-2  Physical Exam  Constitutional: Oriented to person, place, and time and thin appearing female, and in no distress. HENT:  Head: Normocephalic and atraumatic.  Mouth/Throat: Oropharynx is clear and moist. No oropharyngeal exudate.  Eyes: Conjunctivae are normal. Right eye exhibits no discharge. Left eye exhibits no discharge. No scleral icterus.  Neck: Normal range of motion. Neck supple.  Cardiovascular: Normal rate, regular rhythm, normal heart sounds and intact distal pulses.   Pulmonary/Chest: Effort normal and breath sounds normal. No respiratory distress. No wheezes. No rales.  Abdominal: Soft. Bowel sounds are normal. Exhibits no distension and no mass. There is no tenderness.  Musculoskeletal: Normal range of motion. Exhibits no edema.  Lymphadenopathy:    No cervical adenopathy.  Neurological: Alert and oriented to person, place, and time. Exhibits normal muscle tone. Gait normal. Coordination normal.  Skin: Skin is  warm and dry. No rash noted. Not diaphoretic. No erythema. No pallor.  Psychiatric: Mood, memory and judgment normal.  Vitals reviewed.  LABORATORY DATA: Lab Results  Component Value Date   WBC 5.4 04/05/2022   HGB 8.5 (L) 04/05/2022   HCT 23.8 (L) 04/05/2022   MCV 89.5 04/05/2022   PLT 184 04/05/2022      Chemistry      Component Value Date/Time   NA 131 (L) 04/05/2022 0809   K 3.3 (L) 04/05/2022 0809   CL 95 (L) 04/05/2022 0809   CO2 25 04/05/2022 0809   BUN 30 (H) 04/05/2022 0809   CREATININE 1.98 (H) 04/05/2022 0809      Component Value Date/Time   CALCIUM 8.0 (L) 04/05/2022 0809   ALKPHOS 173 (H) 04/05/2022 0809   AST 121 (H) 04/05/2022 0809   ALT 58 (H) 04/05/2022 0809   BILITOT 0.9 04/05/2022 0809       RADIOGRAPHIC STUDIES:  No results found.   ASSESSMENT/PLAN:  This very pleasant 67 year old Caucasian female  diagnosed with stage IV (T2 a, N0, M1 a) non-small cell lung cancer, adenocarcinoma.  The patient presented multifocal bilateral pulmonary nodules involving the right upper lobe as well as small bilateral groundglass opacities.  She was diagnosed in April 2023.  She is positive for a K-ras G12 C mutation and her PD-L1 expression of 4%.  Her K-ras G12 C mutation can be targeted in the second line setting in the future.    The patient is currently undergoing systemic chemotherapy with carboplatin for an AUC of 5, Alimta 500 mg per metered square, Keytruda 200 mg IV every 3 weeks.  She is status post 8 cycles.  She tolerates this fairly well. Starting from cycle #5, she started maintenance alimta and [place].    In early January the patient had a repeat CT scan that showed possible progression with increased size of the nodules in the posterior right upper lobe as well as increase in size of the innumerable bilateral pulmonary nodules.  Therefore Dr. Julien Nordmann discontinued her treatment Alimta Keytruda due to the disease progression.  She was then started on  Krazati 600 mg p.o. twice daily on 03/08/2022.  She tolerated this well for the first 2 weeks but then she developed nausea, vomiting, and diarrhea for which she was seen in the symptom management clinic last week and received IV fluids and electrolyte replacement.  The patient was seen with Dr. Julien Nordmann today.  Dr. Julien Nordmann recommends reducing her dose of Krazati to 400 mg twice daily.  Patient has some slight elevations in her ALT and AST today.  We will arrange for weekly labs and should this increase we would start the patient on steroids.  We will arrange for a close interval follow-up and see her back for follow-up visit in 2 weeks for evaluation and repeat blood work.  Will be see her in 2 weeks we will arrange for a repeat CT scan of the chest, abdomen, pelvis in 1 month.  Patient is hypotensive today.  She does not have a blood pressure cuff at home.  The patient was encouraged to hydrate well at home and monitor her blood pressure at home and purchase a cuff.  Should she ever require IV fluids I advised for her to call our clinic.  She has some elevation in her creatinine today at 1.98.  She also has persistent hypomagnesemia and slight hyperkalemia.  We will arrange for her to receive 1 L of IV fluid in the infusion room today with 20 mill equivalents of potassium and 4 g of magnesium.  Patient has worsening anemia on labs.  We recommended that she restart her Integra plus and take it every other day.  I have also refilled her magnesium and recommend that she take this 1 tablet p.o. daily.  I will recheck her magnesium at her next lab visit.  Due to the worsening anemia, I have arranged for the patient to have stool cards performed to assess for GI blood loss given her Eliquis use.  Her decreased appetite, she does not like boost and Ensure supplemental drinks and I encouraged her to make her own protein drinks.  I will also start her on Megace for her decreased appetite.  The patient was advised  to call immediately if she has any concerning symptoms in the interval. The patient voices understanding of current disease status and treatment options and is in agreement with the current care plan. All questions were answered. The patient knows to call the clinic with any  problems, questions or concerns. We can certainly see the patient much sooner if necessary        Orders Placed This Encounter  Procedures   Occult blood card to lab, stool    Standing Status:   Future    Standing Expiration Date:   04/06/2023   Occult blood card to lab, stool    Standing Status:   Future    Standing Expiration Date:   04/06/2023   Occult blood card to lab, stool    Standing Status:   Future    Standing Expiration Date:   04/06/2023   CBC with Differential ([facility] Only)    Standing Status:   Standing    Number of Occurrences:   10    Standing Expiration Date:   04/06/2023   CMP ([facility] only)    Standing Status:   Standing    Number of Occurrences:   10    Standing Expiration Date:   04/06/2023     I spent {CHL ONC TIME VISIT - CXKGY:[id number]} counseling the patient face to face. The total time spent in the appointment was {CHL ONC TIME VISIT - YOVZC:[id number]}.  Lane Kjos L Amaka Gluth, PA-C 04/05/22

## 2022-04-05 ENCOUNTER — Other Ambulatory Visit: Payer: Self-pay

## 2022-04-05 ENCOUNTER — Encounter: Payer: Self-pay | Admitting: Internal Medicine

## 2022-04-05 ENCOUNTER — Inpatient Hospital Stay: Payer: Medicare Other

## 2022-04-05 ENCOUNTER — Other Ambulatory Visit (HOSPITAL_COMMUNITY): Payer: Self-pay

## 2022-04-05 ENCOUNTER — Telehealth: Payer: Self-pay | Admitting: Internal Medicine

## 2022-04-05 ENCOUNTER — Inpatient Hospital Stay (HOSPITAL_BASED_OUTPATIENT_CLINIC_OR_DEPARTMENT_OTHER): Payer: Medicare Other | Admitting: Physician Assistant

## 2022-04-05 VITALS — BP 106/55 | HR 86 | Resp 16

## 2022-04-05 VITALS — BP 92/60 | HR 95 | Temp 97.6°F | Resp 18 | Ht 63.0 in | Wt 95.1 lb

## 2022-04-05 DIAGNOSIS — C3491 Malignant neoplasm of unspecified part of right bronchus or lung: Secondary | ICD-10-CM

## 2022-04-05 DIAGNOSIS — D649 Anemia, unspecified: Secondary | ICD-10-CM | POA: Diagnosis not present

## 2022-04-05 DIAGNOSIS — I959 Hypotension, unspecified: Secondary | ICD-10-CM

## 2022-04-05 DIAGNOSIS — R7989 Other specified abnormal findings of blood chemistry: Secondary | ICD-10-CM

## 2022-04-05 DIAGNOSIS — E876 Hypokalemia: Secondary | ICD-10-CM

## 2022-04-05 DIAGNOSIS — C3411 Malignant neoplasm of upper lobe, right bronchus or lung: Secondary | ICD-10-CM | POA: Diagnosis not present

## 2022-04-05 DIAGNOSIS — Z95828 Presence of other vascular implants and grafts: Secondary | ICD-10-CM

## 2022-04-05 LAB — SAMPLE TO BLOOD BANK

## 2022-04-05 LAB — CBC WITH DIFFERENTIAL (CANCER CENTER ONLY)
Abs Immature Granulocytes: 0.09 10*3/uL — ABNORMAL HIGH (ref 0.00–0.07)
Basophils Absolute: 0 10*3/uL (ref 0.0–0.1)
Basophils Relative: 1 %
Eosinophils Absolute: 0 10*3/uL (ref 0.0–0.5)
Eosinophils Relative: 0 %
HCT: 23.8 % — ABNORMAL LOW (ref 36.0–46.0)
Hemoglobin: 8.5 g/dL — ABNORMAL LOW (ref 12.0–15.0)
Immature Granulocytes: 2 %
Lymphocytes Relative: 17 %
Lymphs Abs: 0.9 10*3/uL (ref 0.7–4.0)
MCH: 32 pg (ref 26.0–34.0)
MCHC: 35.7 g/dL (ref 30.0–36.0)
MCV: 89.5 fL (ref 80.0–100.0)
Monocytes Absolute: 0.5 10*3/uL (ref 0.1–1.0)
Monocytes Relative: 10 %
Neutro Abs: 3.8 10*3/uL (ref 1.7–7.7)
Neutrophils Relative %: 70 %
Platelet Count: 184 10*3/uL (ref 150–400)
RBC: 2.66 MIL/uL — ABNORMAL LOW (ref 3.87–5.11)
RDW: 16.7 % — ABNORMAL HIGH (ref 11.5–15.5)
WBC Count: 5.4 10*3/uL (ref 4.0–10.5)
nRBC: 0 % (ref 0.0–0.2)

## 2022-04-05 LAB — CMP (CANCER CENTER ONLY)
ALT: 58 U/L — ABNORMAL HIGH (ref 0–44)
AST: 121 U/L — ABNORMAL HIGH (ref 15–41)
Albumin: 3.1 g/dL — ABNORMAL LOW (ref 3.5–5.0)
Alkaline Phosphatase: 173 U/L — ABNORMAL HIGH (ref 38–126)
Anion gap: 11 (ref 5–15)
BUN: 30 mg/dL — ABNORMAL HIGH (ref 8–23)
CO2: 25 mmol/L (ref 22–32)
Calcium: 8 mg/dL — ABNORMAL LOW (ref 8.9–10.3)
Chloride: 95 mmol/L — ABNORMAL LOW (ref 98–111)
Creatinine: 1.98 mg/dL — ABNORMAL HIGH (ref 0.44–1.00)
GFR, Estimated: 27 mL/min — ABNORMAL LOW (ref >60)
Glucose, Bld: 99 mg/dL (ref 70–99)
Potassium: 3.3 mmol/L — ABNORMAL LOW (ref 3.5–5.1)
Sodium: 131 mmol/L — ABNORMAL LOW (ref 135–145)
Total Bilirubin: 0.9 mg/dL (ref 0.3–1.2)
Total Protein: 6.4 g/dL — ABNORMAL LOW (ref 6.5–8.1)

## 2022-04-05 LAB — MAGNESIUM: Magnesium: 1.2 mg/dL — ABNORMAL LOW (ref 1.7–2.4)

## 2022-04-05 MED ORDER — MAGNESIUM OXIDE -MG SUPPLEMENT 400 (240 MG) MG PO TABS
400.0000 mg | ORAL_TABLET | Freq: Every day | ORAL | 0 refills | Status: AC
  Filled 2022-04-05 (×2): qty 30, 30d supply, fill #0

## 2022-04-05 MED ORDER — HEPARIN SOD (PORK) LOCK FLUSH 100 UNIT/ML IV SOLN
500.0000 [IU] | Freq: Once | INTRAVENOUS | Status: AC
  Administered 2022-04-05: 500 [IU]

## 2022-04-05 MED ORDER — POTASSIUM CHLORIDE IN NACL 20-0.9 MEQ/L-% IV SOLN
Freq: Once | INTRAVENOUS | Status: AC
  Filled 2022-04-05: qty 1000

## 2022-04-05 MED ORDER — MEGESTROL ACETATE 20 MG PO TABS
20.0000 mg | ORAL_TABLET | Freq: Every day | ORAL | 2 refills | Status: DC
  Filled 2022-04-05 (×2): qty 30, 30d supply, fill #0

## 2022-04-05 MED ORDER — INTEGRA PLUS PO CAPS
1.0000 | ORAL_CAPSULE | Freq: Every morning | ORAL | 2 refills | Status: DC
  Filled 2022-04-05 – 2022-04-06 (×2): qty 30, 30d supply, fill #0

## 2022-04-05 MED ORDER — SODIUM CHLORIDE 0.9% FLUSH
10.0000 mL | Freq: Once | INTRAVENOUS | Status: AC
  Administered 2022-04-05: 10 mL

## 2022-04-05 MED ORDER — MAGNESIUM SULFATE 4 GM/100ML IV SOLN
4.0000 g | Freq: Once | INTRAVENOUS | Status: AC
  Administered 2022-04-05: 4 g via INTRAVENOUS
  Filled 2022-04-05: qty 100

## 2022-04-05 NOTE — Telephone Encounter (Signed)
Scheduled per 02/13 los, patient has been called and notified.

## 2022-04-05 NOTE — Patient Instructions (Signed)

## 2022-04-06 ENCOUNTER — Other Ambulatory Visit (HOSPITAL_COMMUNITY): Payer: Self-pay

## 2022-04-06 ENCOUNTER — Ambulatory Visit: Payer: Medicare Other | Admitting: Dietician

## 2022-04-06 ENCOUNTER — Other Ambulatory Visit: Payer: Self-pay | Admitting: Internal Medicine

## 2022-04-06 ENCOUNTER — Encounter: Payer: Self-pay | Admitting: Internal Medicine

## 2022-04-06 MED ORDER — ONDANSETRON 4 MG PO TBDP
4.0000 mg | ORAL_TABLET | Freq: Three times a day (TID) | ORAL | 0 refills | Status: DC | PRN
  Filled 2022-04-06: qty 20, 7d supply, fill #0

## 2022-04-06 NOTE — Progress Notes (Signed)
Nutrition Assessment   Reason for Assessment: MST screen for weight loss.    ASSESSMENT: Patient is a 67 year old female with stage IV (T2a, N0, M1a) non-small cell lung cancer.  She is being treated with Alfred Levins and dose was reduced due to intolerance.  She reports episodes of nausea and vomiting.  States she thinks Zofran is helping more than compazine.  She states weight fluctuations are r/t steroids after being in hospital that made her want to always eat.  Since starting the [place] she has n/v and some occasional diarrhea but she uses Imodium.  She reports usual patter of eating a 2 meal per day pattern.  Spouse used to be the chef in the house before he passed from pancreatic cancer.  Now she eats mostly take out. She relayed she doesn't keep much food int he house because it spoils before she eats it. Typical foods she includes : Ham & cheese biscuit, and BBQ (eats a whole child's platter with fries and slaw)  She states she has always been a light eater people would tell her she eats like a bird.  She reports she doesn't like ONS, not sure about Pedialyte, does like orange juice but doesn't buy because it goes bad, does like grape juice.    Anthropometrics:  weight loss 9# (8.4%) less than month  Height: 63" Weight:  04/05/22  95# 03/21/22 106.4# UBW: she states she couldn't say, was higher prior to spouse's death BMI: 16.85  INTERVENTION:  Relayed that nutrition services are wrap around service provided at no charge and encouraged continued communication if experiencing continued weight loss or any nutritional impact symptoms (NIS).  Encouraged small frequent feeds and trying to eat 6 small meals  Discussed strategies for nausea, and diarrhea.  Reviewed shelf stable options to keep in home t boost intake between meals.  Emailed Nutrition Tip sheet  for  nausea and potassium with contact information provided   MONITORING, EVALUATION, GOAL: weight, PO intake, Nutrition Impact  Symptoms, labs Goal is weight maintenance  Next Visit: remote next month  April Manson, RDN, LDN Registered Dietitian, [facility] Part Time Remote (Usual office hours: Tuesday-Thursday) Cell: [phone]

## 2022-04-07 ENCOUNTER — Encounter: Payer: Medicare Other | Admitting: Dietician

## 2022-04-07 ENCOUNTER — Other Ambulatory Visit (HOSPITAL_COMMUNITY): Payer: Self-pay

## 2022-04-08 ENCOUNTER — Encounter: Payer: Self-pay | Admitting: Internal Medicine

## 2022-04-09 ENCOUNTER — Emergency Department (HOSPITAL_COMMUNITY): Payer: Medicare Other

## 2022-04-09 ENCOUNTER — Encounter (HOSPITAL_COMMUNITY): Payer: Self-pay

## 2022-04-09 ENCOUNTER — Observation Stay (HOSPITAL_COMMUNITY)
Admission: EM | Admit: 2022-04-09 | Discharge: 2022-04-10 | Disposition: A | Discharge status: Home / Self Care | Payer: Medicare Other | Attending: Internal Medicine | Admitting: Internal Medicine

## 2022-04-09 ENCOUNTER — Other Ambulatory Visit: Payer: Self-pay

## 2022-04-09 DIAGNOSIS — J449 Chronic obstructive pulmonary disease, unspecified: Secondary | ICD-10-CM | POA: Diagnosis not present

## 2022-04-09 DIAGNOSIS — N1832 Chronic kidney disease, stage 3b: Secondary | ICD-10-CM | POA: Diagnosis not present

## 2022-04-09 DIAGNOSIS — E860 Dehydration: Secondary | ICD-10-CM | POA: Diagnosis not present

## 2022-04-09 DIAGNOSIS — Z79899 Other long term (current) drug therapy: Secondary | ICD-10-CM | POA: Diagnosis not present

## 2022-04-09 DIAGNOSIS — Z681 Body mass index (BMI) 19 or less, adult: Secondary | ICD-10-CM | POA: Insufficient documentation

## 2022-04-09 DIAGNOSIS — R5381 Other malaise: Secondary | ICD-10-CM | POA: Diagnosis not present

## 2022-04-09 DIAGNOSIS — F17210 Nicotine dependence, cigarettes, uncomplicated: Secondary | ICD-10-CM | POA: Insufficient documentation

## 2022-04-09 DIAGNOSIS — R5383 Other fatigue: Secondary | ICD-10-CM | POA: Diagnosis present

## 2022-04-09 DIAGNOSIS — C3491 Malignant neoplasm of unspecified part of right bronchus or lung: Secondary | ICD-10-CM | POA: Diagnosis present

## 2022-04-09 DIAGNOSIS — C3490 Malignant neoplasm of unspecified part of unspecified bronchus or lung: Secondary | ICD-10-CM | POA: Insufficient documentation

## 2022-04-09 DIAGNOSIS — R7401 Elevation of levels of liver transaminase levels: Secondary | ICD-10-CM | POA: Diagnosis not present

## 2022-04-09 DIAGNOSIS — Z7901 Long term (current) use of anticoagulants: Secondary | ICD-10-CM | POA: Insufficient documentation

## 2022-04-09 DIAGNOSIS — Z86711 Personal history of pulmonary embolism: Secondary | ICD-10-CM | POA: Diagnosis present

## 2022-04-09 DIAGNOSIS — D649 Anemia, unspecified: Secondary | ICD-10-CM | POA: Diagnosis not present

## 2022-04-09 DIAGNOSIS — R636 Underweight: Secondary | ICD-10-CM | POA: Diagnosis not present

## 2022-04-09 DIAGNOSIS — E876 Hypokalemia: Secondary | ICD-10-CM

## 2022-04-09 DIAGNOSIS — R7989 Other specified abnormal findings of blood chemistry: Secondary | ICD-10-CM | POA: Diagnosis present

## 2022-04-09 LAB — CBC WITH DIFFERENTIAL/PLATELET
Abs Immature Granulocytes: 0.19 10*3/uL — ABNORMAL HIGH (ref 0.00–0.07)
Basophils Absolute: 0.1 10*3/uL (ref 0.0–0.1)
Basophils Relative: 1 %
Eosinophils Absolute: 0 10*3/uL (ref 0.0–0.5)
Eosinophils Relative: 1 %
HCT: 22.8 % — ABNORMAL LOW (ref 36.0–46.0)
Hemoglobin: 7.6 g/dL — ABNORMAL LOW (ref 12.0–15.0)
Immature Granulocytes: 3 %
Lymphocytes Relative: 18 %
Lymphs Abs: 1.1 10*3/uL (ref 0.7–4.0)
MCH: 31.3 pg (ref 26.0–34.0)
MCHC: 33.3 g/dL (ref 30.0–36.0)
MCV: 93.8 fL (ref 80.0–100.0)
Monocytes Absolute: 0.9 10*3/uL (ref 0.1–1.0)
Monocytes Relative: 14 %
Neutro Abs: 4 10*3/uL (ref 1.7–7.7)
Neutrophils Relative %: 63 %
Platelets: 166 10*3/uL (ref 150–400)
RBC: 2.43 MIL/uL — ABNORMAL LOW (ref 3.87–5.11)
RDW: 17.4 % — ABNORMAL HIGH (ref 11.5–15.5)
WBC: 6.3 10*3/uL (ref 4.0–10.5)
nRBC: 0 % (ref 0.0–0.2)

## 2022-04-09 LAB — POC OCCULT BLOOD, ED: Fecal Occult Bld: NEGATIVE

## 2022-04-09 LAB — COMPREHENSIVE METABOLIC PANEL
ALT: 69 U/L — ABNORMAL HIGH (ref 0–44)
AST: 155 U/L — ABNORMAL HIGH (ref 15–41)
Albumin: 2.6 g/dL — ABNORMAL LOW (ref 3.5–5.0)
Alkaline Phosphatase: 284 U/L — ABNORMAL HIGH (ref 38–126)
Anion gap: 10 (ref 5–15)
BUN: 23 mg/dL (ref 8–23)
CO2: 23 mmol/L (ref 22–32)
Calcium: 7.7 mg/dL — ABNORMAL LOW (ref 8.9–10.3)
Chloride: 97 mmol/L — ABNORMAL LOW (ref 98–111)
Creatinine, Ser: 1.49 mg/dL — ABNORMAL HIGH (ref 0.44–1.00)
GFR, Estimated: 39 mL/min — ABNORMAL LOW (ref >60)
Glucose, Bld: 113 mg/dL — ABNORMAL HIGH (ref 70–99)
Potassium: 2.7 mmol/L — CL (ref 3.5–5.1)
Sodium: 130 mmol/L — ABNORMAL LOW (ref 135–145)
Total Bilirubin: 1 mg/dL (ref 0.3–1.2)
Total Protein: 5.7 g/dL — ABNORMAL LOW (ref 6.5–8.1)

## 2022-04-09 LAB — TSH: TSH: 2.587 u[IU]/mL (ref 0.350–4.500)

## 2022-04-09 LAB — TROPONIN I (HIGH SENSITIVITY): Troponin I (High Sensitivity): 9 ng/L (ref <18)

## 2022-04-09 LAB — MAGNESIUM: Magnesium: 1.5 mg/dL — ABNORMAL LOW (ref 1.7–2.4)

## 2022-04-09 MED ORDER — MAGNESIUM SULFATE 2 GM/50ML IV SOLN
2.0000 g | Freq: Once | INTRAVENOUS | Status: AC
  Administered 2022-04-09: 2 g via INTRAVENOUS
  Filled 2022-04-09: qty 50

## 2022-04-09 MED ORDER — POTASSIUM CHLORIDE CRYS ER 20 MEQ PO TBCR
40.0000 meq | EXTENDED_RELEASE_TABLET | Freq: Once | ORAL | Status: AC
  Administered 2022-04-09: 40 meq via ORAL
  Filled 2022-04-09: qty 2

## 2022-04-09 MED ORDER — ALBUTEROL SULFATE (2.5 MG/3ML) 0.083% IN NEBU: 2.5000 mg | INHALATION_SOLUTION | Freq: Four times a day (QID) | RESPIRATORY_TRACT | Status: DC | PRN

## 2022-04-09 MED ORDER — MAGNESIUM OXIDE -MG SUPPLEMENT 400 (240 MG) MG PO TABS
400.0000 mg | ORAL_TABLET | Freq: Every day | ORAL | Status: DC
  Administered 2022-04-10: 400 mg via ORAL
  Filled 2022-04-09: qty 1

## 2022-04-09 MED ORDER — PANTOPRAZOLE SODIUM 40 MG PO TBEC
40.0000 mg | DELAYED_RELEASE_TABLET | Freq: Every day | ORAL | Status: DC
  Administered 2022-04-10: 40 mg via ORAL
  Filled 2022-04-09: qty 1

## 2022-04-09 MED ORDER — LACTATED RINGERS IV BOLUS
1000.0000 mL | Freq: Once | INTRAVENOUS | Status: AC
  Administered 2022-04-09: 1000 mL via INTRAVENOUS

## 2022-04-09 MED ORDER — POTASSIUM CHLORIDE 10 MEQ/100ML IV SOLN
10.0000 meq | INTRAVENOUS | Status: AC
  Administered 2022-04-09 – 2022-04-10 (×4): 10 meq via INTRAVENOUS
  Filled 2022-04-09: qty 100

## 2022-04-09 MED ORDER — MAGNESIUM OXIDE -MG SUPPLEMENT 400 (240 MG) MG PO TABS
400.0000 mg | ORAL_TABLET | Freq: Once | ORAL | Status: AC
  Administered 2022-04-09: 400 mg via ORAL
  Filled 2022-04-09: qty 1

## 2022-04-09 MED ORDER — METHOCARBAMOL 500 MG PO TABS
500.0000 mg | ORAL_TABLET | Freq: Four times a day (QID) | ORAL | Status: DC | PRN
  Administered 2022-04-10: 500 mg via ORAL
  Filled 2022-04-09: qty 1

## 2022-04-09 MED ORDER — ONDANSETRON 4 MG PO TBDP
4.0000 mg | ORAL_TABLET | Freq: Once | ORAL | Status: AC
  Administered 2022-04-09: 4 mg via ORAL
  Filled 2022-04-09: qty 1

## 2022-04-09 MED ORDER — PROCHLORPERAZINE MALEATE 10 MG PO TABS: 10.0000 mg | ORAL_TABLET | Freq: Four times a day (QID) | ORAL | Status: DC | PRN

## 2022-04-09 MED ORDER — ALBUTEROL SULFATE HFA 108 (90 BASE) MCG/ACT IN AERS: 2.0000 | INHALATION_SPRAY | Freq: Four times a day (QID) | RESPIRATORY_TRACT | Status: DC | PRN

## 2022-04-09 MED ORDER — TRAMADOL HCL 50 MG PO TABS
50.0000 mg | ORAL_TABLET | Freq: Four times a day (QID) | ORAL | Status: DC | PRN
  Administered 2022-04-10: 50 mg via ORAL
  Filled 2022-04-09: qty 1

## 2022-04-09 MED ORDER — APIXABAN 2.5 MG PO TABS
2.5000 mg | ORAL_TABLET | Freq: Two times a day (BID) | ORAL | Status: DC
  Administered 2022-04-10 (×2): 2.5 mg via ORAL
  Filled 2022-04-09 (×2): qty 1

## 2022-04-09 NOTE — Assessment & Plan Note (Signed)
Right upper quadrant ReSound showing hepatic steatosis but no evidence of cholecystitis

## 2022-04-09 NOTE — Assessment & Plan Note (Signed)
Pt/ ot eval prior to dc

## 2022-04-09 NOTE — Assessment & Plan Note (Signed)
This point hemoglobin 7.6 but I suspect this will continue to trend down with IV fluid rehydration.  Patient likely will need blood transfusion.  Ordered type and screen repeat CBC and transfuse as needed if hemoglobin continues to trend patient is symptomatic and or hemoglobin below 7

## 2022-04-09 NOTE — ED Notes (Signed)
ED TO INPATIENT HANDOFF REPORT  Name/Age/Gender Sue Chase 67 y.o. female  Code Status    Code Status Orders  (From admission, onward)           Start     Ordered   04/09/22 2310  Full code  Continuous       Question:  By:  Answer:  Consent: discussion documented in EHR   04/09/22 2310           Code Status History     Date Active Date Inactive Code Status Order ID Comments User Context   02/17/2022 2350 02/25/2022 1915 Full Code [id number]  Lenore Cordia, MD ED   11/03/2021 1749 11/06/2021 1633 Full Code [id number]  Jonnie Finner, DO Inpatient   02/19/2021 1350 02/21/2021 1839 Full Code [id number]  Jonnie Finner, DO Inpatient       Home/SNF/Other Home  Chief Complaint Dehydration [E86.0]  Level of Care/Admitting Diagnosis ED Disposition     ED Disposition  Admit   Condition  --   Comment  Hospital Area: [place] [[id number]]  Level of Care: Progressive [102]  Admit to Progressive based on following criteria: CARDIOVASCULAR & THORACIC of moderate stability with acute coronary syndrome symptoms/low risk myocardial infarction/hypertensive urgency/arrhythmias/heart failure potentially compromising stability and stable post cardiovascular intervention patients.  May place patient in observation at [facility] or [facility] if equivalent level of care is available:: No  Covid Evaluation: Asymptomatic - no recent exposure (last 10 days) testing not required  Diagnosis: Dehydration [276.51.ICD-9-CM]  Admitting Physician: Toy Baker [[id number]]  Attending Physician: Toy Baker [[id number]]          Medical History Past Medical History:  Diagnosis Date   Colon polyps    Complication of anesthesia    tolerated propofol but other meds made her emotional / cry/ difficulty breathing/ panic   Hyperlipidemia    Rheumatic fever    Seasonal allergies    UTI (urinary tract infection)     Allergies Allergies  Allergen Reactions    Codeine Itching    IV Location/Drains/Wounds Patient Lines/Drains/Airways Status     Active Line/Drains/Airways     Name Placement date Placement time Site Days   Implanted Port 08/12/21 Right Chest 08/12/21  1630  Chest  240   Peripheral IV 04/09/22 20 G 1" Left Antecubital 04/09/22  1855  Antecubital  less than 1            Labs/Imaging Results for orders placed or performed during the hospital encounter of 04/09/22 (from the past 48 hour(s))  CBC with Differential     Status: Abnormal   Collection Time: 04/09/22  7:12 PM  Result Value Ref Range   WBC 6.3 4.0 - 10.5 K/uL   RBC 2.43 (L) 3.87 - 5.11 MIL/uL   Hemoglobin 7.6 (L) 12.0 - 15.0 g/dL   HCT 22.8 (L) 36.0 - 46.0 %   MCV 93.8 80.0 - 100.0 fL   MCH 31.3 26.0 - 34.0 pg   MCHC 33.3 30.0 - 36.0 g/dL   RDW 17.4 (H) 11.5 - 15.5 %   Platelets 166 150 - 400 K/uL   nRBC 0.0 0.0 - 0.2 %   Neutrophils Relative % 63 %   Neutro Abs 4.0 1.7 - 7.7 K/uL   Lymphocytes Relative 18 %   Lymphs Abs 1.1 0.7 - 4.0 K/uL   Monocytes Relative 14 %   Monocytes Absolute 0.9 0.1 - 1.0 K/uL   Eosinophils Relative 1 %  Eosinophils Absolute 0.0 0.0 - 0.5 K/uL   Basophils Relative 1 %   Basophils Absolute 0.1 0.0 - 0.1 K/uL   Immature Granulocytes 3 %   Abs Immature Granulocytes 0.19 (H) 0.00 - 0.07 K/uL    Comment: Performed at [facility], [place] [address] [place], [place] [address]  Comprehensive metabolic panel     Status: Abnormal   Collection Time: 04/09/22  7:12 PM  Result Value Ref Range   Sodium 130 (L) 135 - 145 mmol/L   Potassium 2.7 (LL) 3.5 - 5.1 mmol/L    Comment: CRITICAL RESULT CALLED TO, READ BACK BY AND VERIFIED WITH Aiyden Lauderback,S AT 2018 ON 04/09/22 BY LUZOLOP    Chloride 97 (L) 98 - 111 mmol/L   CO2 23 22 - 32 mmol/L   Glucose, Bld 113 (H) 70 - 99 mg/dL    Comment: Glucose reference range applies only to samples taken after fasting for at least 8 hours.   BUN 23 8 - 23 mg/dL   Creatinine, Ser 1.49  (H) 0.44 - 1.00 mg/dL   Calcium 7.7 (L) 8.9 - 10.3 mg/dL   Total Protein 5.7 (L) 6.5 - 8.1 g/dL   Albumin 2.6 (L) 3.5 - 5.0 g/dL   AST 155 (H) 15 - 41 U/L   ALT 69 (H) 0 - 44 U/L   Alkaline Phosphatase 284 (H) 38 - 126 U/L   Total Bilirubin 1.0 0.3 - 1.2 mg/dL   GFR, Estimated 39 (L) >60 mL/min    Comment: (NOTE) Calculated using the CKD-EPI Creatinine Equation (2021)    Anion gap 10 5 - 15    Comment: Performed at [facility], [place] [address] [place], [place] [address]  Magnesium     Status: Abnormal   Collection Time: 04/09/22  7:12 PM  Result Value Ref Range   Magnesium 1.5 (L) 1.7 - 2.4 mg/dL    Comment: Performed at [facility], [place] [address] [place], [place] [address]  Troponin I (High Sensitivity)     Status: None   Collection Time: 04/09/22  7:12 PM  Result Value Ref Range   Troponin I (High Sensitivity) 9 <18 ng/L    Comment: (NOTE) Elevated high sensitivity troponin I (hsTnI) values and significant  changes across serial measurements may suggest ACS but many other  chronic and acute conditions are known to elevate hsTnI results.  Refer to the "Links" section for chest pain algorithms and additional  guidance. Performed at [facility], [place] [address], [place], [place] [address]   TSH     Status: None   Collection Time: 04/09/22  7:13 PM  Result Value Ref Range   TSH 2.587 0.350 - 4.500 uIU/mL    Comment: Performed by a 3rd Generation assay with a functional sensitivity of <=0.01 uIU/mL. Performed at [facility], [place] [address] [place],  [address]   POC occult blood, ED     Status: None   Collection Time: 04/09/22  8:40 PM  Result Value Ref Range   Fecal Occult Bld NEGATIVE NEGATIVE   US Abdomen Limited RUQ (LIVER/GB)  Result Date: 04/09/2022 CLINICAL DATA:  Elevated LFTs EXAM: ULTRASOUND ABDOMEN LIMITED RIGHT UPPER QUADRANT COMPARISON:  12/30/2021 CT, ultrasound from  11/03/2021 FINDINGS: Gallbladder: Gallbladder is decompressed accentuating the wall thickness. No pericholecystic fluid is noted. Negative sonographic Murphy's sign is elicited. No gallstones are seen. Common bile duct: Diameter: 3 mm Liver: Increase in echogenicity likely related to fatty infiltration.  No focal mass is seen. Portal vein is patent on color Doppler imaging with normal direction of blood flow towards the liver. Other: None. IMPRESSION: Partially contracted gallbladder. Fatty infiltration of the liver. Electronically Signed   By: Inez Catalina M.D.   On: 04/09/2022 21:15   DG Chest Portable 1 View  Result Date: 04/09/2022 CLINICAL DATA:  Increasing weakness and fatigue EXAM: PORTABLE CHEST 1 VIEW COMPARISON:  02/17/2022 FINDINGS: Cardiac shadow is stable. Lungs are hyperinflated but clear. Right chest wall port is noted. Previously seen right upper lobe opacity is less prominent than on the prior exam. No new focal abnormality is seen. IMPRESSION: Known right upper lobe opacity is less well visualized than on the prior exam. No new focal abnormality is noted. Electronically Signed   By: Inez Catalina M.D.   On: 04/09/2022 19:24    Pending Labs Unresulted Labs (From admission, onward)     Start     Ordered   04/10/22 0100  Magnesium  Once,   R       Question:  Release to patient  Answer:  Immediate   04/09/22 2302   04/10/22 1610  Basic metabolic panel  Once,   R       Question:  Release to patient  Answer:  Immediate   04/09/22 2302   04/10/22 0100  CBC  Now then every 6 hours,   R (with TIMED occurrences)     Question:  Release to patient  Answer:  Immediate   04/09/22 2302   04/10/22 0100  Vitamin B12  (Anemia Panel (PNL))  Once,   R        04/09/22 2302   04/10/22 0100  Folate  (Anemia Panel (PNL))  Once,   R        04/09/22 2302   04/09/22 2307  Phosphorus  Add-on,   AD        04/09/22 2306   04/09/22 2304  CK  Add-on,   AD        04/09/22 2303   04/09/22 2304  Protime-INR   Once,   R       Question:  Release to patient  Answer:  Immediate   04/09/22 2303   04/09/22 2303  Reticulocytes  (Anemia Panel (PNL))  Add-on,   AD        04/09/22 2302   04/09/22 2303  Urinalysis, Complete w Microscopic -Urine, Clean Catch  Once,   R       Question Answer Comment  Release to patient Immediate   Specimen Source Urine, Clean Catch      04/09/22 2302   04/09/22 2302  TSH  Add-on,   AD       Question:  Release to patient  Answer:  Immediate   04/09/22 2302   04/09/22 2301  Lactic acid, plasma  STAT Now then every 3 hours,   R (with STAT occurrences)     Question:  Release to patient  Answer:  Immediate   04/09/22 2302   04/09/22 2257  Type and screen [place]  Once,   STAT       Comments: [place]    04/09/22 2256   04/09/22 2049  Iron and TIBC  Once,   URGENT        04/09/22 2048   04/09/22 2049  Ferritin (Iron Binding Protein)  Once,   URGENT        04/09/22 2048   04/09/22 1833  T4, free  Once,   URGENT        04/09/22 1833   Signed and Held  HIV Antibody (routine testing w rflx)  (HIV Antibody (Routine testing w reflex) panel)  Once,   R        Signed and Held   Signed and Held  Comprehensive metabolic panel  Tomorrow morning,   R       Question:  Release to patient  Answer:  Immediate   Signed and Held   Signed and Held  CBC  Tomorrow morning,   R       Question:  Release to patient  Answer:  Immediate   Signed and Held   Signed and Held  Magnesium  Tomorrow morning,   R        Signed and Held   Signed and Held  Phosphorus  Tomorrow morning,   R        Signed and Held            Vitals/Pain Today's Vitals   04/09/22 2200 04/09/22 2220 04/09/22 2230 04/09/22 2300  BP:  98/62 (!) 97/57   Pulse: 91 95 86   Resp: 16 13 16    Temp:    98.2 F (36.8 C)  TempSrc:    Oral  SpO2: 100% 100% 99%     Isolation Precautions No active isolations  Medications Medications  potassium chloride 10 mEq in 100 mL  IVPB (10 mEq Intravenous New Bag/Given 04/09/22 2320)  ondansetron (ZOFRAN-ODT) disintegrating tablet 4 mg (4 mg Oral Given 04/09/22 1843)  lactated ringers bolus 1,000 mL (0 mLs Intravenous Stopped 04/09/22 2100)  magnesium oxide (MAG-OX) tablet 400 mg (400 mg Oral Given 04/09/22 2100)  magnesium sulfate IVPB 2 g 50 mL (0 g Intravenous Stopped 04/09/22 2222)  potassium chloride SA (KLOR-CON M) CR tablet 40 mEq (40 mEq Oral Patient Refused/Not Given 04/09/22 2105)    Mobility walks with person assist

## 2022-04-09 NOTE — Subjective & Objective (Signed)
Patient with known stage IV non-small lung cancer treated with Alfred Levins  Patient has been having increasing progressive weakness feeling very tired has been kind and nauseous decreased appetite She has been receiving Zofran and Compazine for nausea and IV fluids in the office She has occasional diarrhea for which she takes Imodium she has been progressively more anemic and currently is on Eliquis Was seen by nutrition therapist just on 14 February she has been losing weight at back and she was already noted to be hypotensive and was told to get IV fluid she received 1 L in infusion room and her potassium was attempted to be repeated to be if 20 mEq and 4 g of magnesium

## 2022-04-09 NOTE — Assessment & Plan Note (Signed)
Patient was on Eliquis but her hemoglobin seems to be trending down.  She is not reporting any blood per stool or melena.  But will need to be careful regarding this difficult situation as patient is certainly still at risk for recurrent blood clots appreciate hematology oncology consult

## 2022-04-09 NOTE — H&P (Signed)
Tu Shimmel DVV:[id number] DOB: Mar 11, 1955 DOA: 04/09/2022     PCP: [name], NP   Outpatient Specialists:      Oncology  Dr. Lorna Few    Patient arrived to ER on 04/09/22 at 1726 Referred by Attending Regan Lemming, MD   Patient coming from:    home Lives alone,      Chief Complaint:   Chief Complaint  Patient presents with   Fatigue    HPI: Sue Chase is a 67 y.o. female with medical history significant of stage IV lung cancer now on Krazi, hx of PE on Eliquis, anemia,      Presented with generalized fatigue Patient with known stage IV non-small lung cancer treated with Alfred Levins  Patient has been having increasing progressive weakness feeling very tired has been kind and nauseous decreased appetite She has been receiving Zofran and Compazine for nausea and IV fluids in the office She has occasional diarrhea for which she takes Imodium she has been progressively more anemic and currently is on Eliquis Was seen by nutrition therapist just on 14 February she has been losing weight at back and she was already noted to be hypotensive and was told to get IV fluid she received 1 L in infusion room and her potassium was attempted to be repeated to be if 20 mEq and 4 g of magnesium  No black stools no   Does not smoke or drink  No syncope no CP   No fever or chills General weakness no localized neurological issues Regarding pertinent Chronic problems:     Lung cancer currently on Krazati        COPD - not  not  on baseline oxygen        Hx of  PE 2022 on - anticoagulation with  Eliquis,    CKD stage IIIb- baseline Cr 1.7 Estimated Creatinine Clearance: 25.3 mL/min (A) (by C-G formula based on SCr of 1.49 mg/dL (H)).  Lab Results  Component Value Date   CREATININE 1.49 (H) 04/09/2022   CREATININE 1.98 (H) 04/05/2022   CREATININE 1.71 (H) 03/29/2022    Chronic anemia - baseline hg Hemoglobin & Hematocrit  Recent Labs     03/29/22 1154 04/05/22 0809 04/09/22 1912  HGB 8.7* 8.5* 7.6*     While in ER: Clinical Course as of 04/09/22 2251  Sat Apr 09, 2022  2032 Potassium(!!): 2.7 [JL]  2032 Magnesium(!): 1.5 [JL]  2032 Hemoglobin(!): 7.6 [JL]  2131 Fecal Occult Blood, POC: NEGATIVE [JL]    Clinical Course User Index [JL] Regan Lemming, MD      CXR - Known right upper lobe opacity is less well visualized than on the prior exam. No new focal abnormality is noted.  RUQ [place] Partially contracted gallbladder.   Fatty infiltration of the liver. Following Medications were ordered in ER: Medications  potassium chloride 10 mEq in 100 mL IVPB (10 mEq Intravenous New Bag/Given 04/09/22 2218)  ondansetron (ZOFRAN-ODT) disintegrating tablet 4 mg (4 mg Oral Given 04/09/22 1843)  lactated ringers bolus 1,000 mL (0 mLs Intravenous Stopped 04/09/22 2100)  magnesium oxide (MAG-OX) tablet 400 mg (400 mg Oral Given 04/09/22 2100)  magnesium sulfate IVPB 2 g 50 mL (0 g Intravenous Stopped 04/09/22 2222)  potassium chloride SA (KLOR-CON M) CR tablet 40 mEq (40 mEq Oral Patient Refused/Not Given 04/09/22 2105)       ED Triage Vitals  Enc Vitals Group     BP 04/09/22 1748 103/64  Pulse Rate 04/09/22 1752 80     Resp 04/09/22 1753 16     Temp 04/09/22 1752 98 F (36.7 C)     Temp Source 04/09/22 1752 Oral     SpO2 04/09/22 1752 100 %     Weight --      Height --      Head Circumference --      Peak Flow --      Pain Score --      Pain Loc --      Pain Edu? --      Excl. in [place]? --   TMAX(24)@     _________________________________________ Significant initial  Findings: Abnormal Labs Reviewed  CBC WITH DIFFERENTIAL/PLATELET - Abnormal; Notable for the following components:      Result Value   RBC 2.43 (*)    Hemoglobin 7.6 (*)    HCT 22.8 (*)    RDW 17.4 (*)    Abs Immature Granulocytes 0.19 (*)    All other components within normal limits  COMPREHENSIVE METABOLIC PANEL - Abnormal; Notable for the  following components:   Sodium 130 (*)    Potassium 2.7 (*)    Chloride 97 (*)    Glucose, Bld 113 (*)    Creatinine, Ser 1.49 (*)    Calcium 7.7 (*)    Total Protein 5.7 (*)    Albumin 2.6 (*)    AST 155 (*)    ALT 69 (*)    Alkaline Phosphatase 284 (*)    GFR, Estimated 39 (*)    All other components within normal limits  MAGNESIUM - Abnormal; Notable for the following components:   Magnesium 1.5 (*)    All other components within normal limits      ECG: Ordered Personally reviewed and interpreted by me showing: HR : 102 Rhythm:Sinus tachycardia Anterior infarct, old QTC 465   The recent clinical data is shown below. Vitals:   04/09/22 2100 04/09/22 2145 04/09/22 2200 04/09/22 2220  BP:    98/62  Pulse: 91 90 91 95  Resp:  14 16 13   Temp:      TempSrc:      SpO2:  100% 100% 100%    WBC     Component Value Date/Time   WBC 6.3 04/09/2022 1912   LYMPHSABS 1.1 04/09/2022 1912   MONOABS 0.9 04/09/2022 1912   EOSABS 0.0 04/09/2022 1912   BASOSABS 0.1 04/09/2022 1912    Lactic Acid, Venous No results found for: "LATICACIDVEN"     UA  ordered   Urine analysis:    Component Value Date/Time   COLORURINE YELLOW 02/17/2022 2340   APPEARANCEUR CLEAR 02/17/2022 2340   LABSPEC 1.034 (H) 02/17/2022 2340   PHURINE 5.0 02/17/2022 2340   GLUCOSEU NEGATIVE 02/17/2022 2340   HGBUR NEGATIVE 02/17/2022 2340   BILIRUBINUR NEGATIVE 02/17/2022 2340   BILIRUBINUR neg 10/19/2021 1106   KETONESUR 5 (A) 02/17/2022 2340   PROTEINUR NEGATIVE 02/17/2022 2340   UROBILINOGEN 0.2 10/19/2021 1106   NITRITE NEGATIVE 02/17/2022 2340   LEUKOCYTESUR NEGATIVE 02/17/2022 2340    _______________________________________________ Hospitalist was called for admission for   Dehydration, Hypokalemia,  Hypomagnesemia Anemia, unspecified type     The following Work up has been ordered so far:  Orders Placed This Encounter  Procedures   DG Chest Portable 1 View   US Abdomen Limited RUQ  (LIVER/GB)   CBC with Differential   Comprehensive metabolic panel   TSH   T4, free   Magnesium  Iron and TIBC   Ferritin (Iron Binding Protein)   Consult to hospitalist   POC occult blood, ED   ED EKG     OTHER Significant initial  Findings:  labs showing:    Recent Labs  Lab 04/05/22 0809 04/09/22 1912  NA 131* 130*  K 3.3* 2.7*  CO2 25 23  GLUCOSE 99 113*  BUN 30* 23  CREATININE 1.98* 1.49*  CALCIUM 8.0* 7.7*  MG 1.2* 1.5*    Cr  * stable,  Up from baseline see below Lab Results  Component Value Date   CREATININE 1.49 (H) 04/09/2022   CREATININE 1.98 (H) 04/05/2022   CREATININE 1.71 (H) 03/29/2022    Recent Labs  Lab 04/05/22 0809 04/09/22 1912  AST 121* 155*  ALT 58* 69*  ALKPHOS 173* 284*  BILITOT 0.9 1.0  PROT 6.4* 5.7*  ALBUMIN 3.1* 2.6*   Lab Results  Component Value Date   CALCIUM 7.7 (L) 04/09/2022   PHOS 2.4 (L) 02/25/2022          Plt: Lab Results  Component Value Date   PLT 166 04/09/2022    COVID-19 Labs  No results for input(s): "DDIMER", "FERRITIN", "LDH", "CRP" in the last 72 hours.  Lab Results  Component Value Date   SARSCOV2NAA NEGATIVE 02/17/2022   SARSCOV2NAA RESULT: NEGATIVE 06/04/2021   [place] NEGATIVE 02/18/2021     Recent Labs  Lab 04/05/22 0809 04/09/22 1912  WBC 5.4 6.3  NEUTROABS 3.8 4.0  HGB 8.5* 7.6*  HCT 23.8* 22.8*  MCV 89.5 93.8  PLT 184 166    HG/HCT    Down     Component Value Date/Time   HGB 7.6 (L) 04/09/2022 1912   HGB 8.5 (L) 04/05/2022 0809   HCT 22.8 (L) 04/09/2022 1912   MCV 93.8 04/09/2022 1912    Cardiac Panel (last 3 results) No results for input(s): "CKTOTAL", "CKMB", "TROPONINI", "RELINDX" in the last 72 hours.  .car BNP (last 3 results) Recent Labs    02/17/22 1935  BNP 85.5     DM  labs:  HbA1C: No results for input(s): "HGBA1C" in the last 8760 hours.     CBG (last 3)  No results for input(s): "GLUCAP" in the last 72 hours.     Cultures:     Component Value Date/Time   SDES SPUTUM 02/19/2021 1343   SDES  02/19/2021 1343    SPUTUM Performed at [facility], [place] [address] [place], [place] [address]    [place] 02/19/2021 1343   SPECREQUEST  02/19/2021 1343    NONE Reflexed from [id number] Performed at [facility], [place] [address], [place], [place] [address]    CULT  02/19/2021 1343    ABUNDANT MORAXELLA CATARRHALIS(BRANHAMELLA) BETA LACTAMASE POSITIVE Performed at [facility], [place] [address], [place], [place] [address]    REPTSTATUS 02/19/2021 FINAL 02/19/2021 1343   REPTSTATUS 02/21/2021 FINAL 02/19/2021 1343     Radiological Exams on Admission: US Abdomen Limited RUQ (LIVER/GB)  Result Date: 04/09/2022 CLINICAL DATA:  Elevated LFTs EXAM: ULTRASOUND ABDOMEN LIMITED RIGHT UPPER QUADRANT COMPARISON:  12/30/2021 CT, ultrasound from 11/03/2021 FINDINGS: Gallbladder: Gallbladder is decompressed accentuating the wall thickness. No pericholecystic fluid is noted. Negative sonographic Murphy's sign is elicited. No gallstones are seen. Common bile duct: Diameter: 3 mm Liver: Increase in echogenicity likely related to fatty infiltration. No focal mass is seen. Portal vein is patent on color Doppler imaging with normal direction of blood flow towards the  liver. Other: None. IMPRESSION: Partially contracted gallbladder. Fatty infiltration of the liver. Electronically Signed   By: Inez Catalina M.D.   On: 04/09/2022 21:15   DG Chest Portable 1 View  Result Date: 04/09/2022 CLINICAL DATA:  Increasing weakness and fatigue EXAM: PORTABLE CHEST 1 VIEW COMPARISON:  02/17/2022 FINDINGS: Cardiac shadow is stable. Lungs are hyperinflated but clear. Right chest wall port is noted. Previously seen right upper lobe opacity is less prominent than on the prior exam. No new focal abnormality is seen. IMPRESSION: Known right upper lobe opacity is less well visualized than on the prior exam. No new focal  abnormality is noted. Electronically Signed   By: Inez Catalina M.D.   On: 04/09/2022 19:24   _______________________________________________________________________________________________________ Latest  Blood pressure 98/62, pulse 95, temperature 98 F (36.7 C), temperature source Oral, resp. rate 13, SpO2 100 %.   Vitals  labs and radiology finding personally reviewed  Review of Systems:    Pertinent positives include:   fatigue,  Constitutional:  No weight loss, night sweats, Fevers, chills, weight loss  HEENT:  No headaches, Difficulty swallowing,Tooth/dental problems,Sore throat,  No sneezing, itching, ear ache, nasal congestion, post nasal drip,  Cardio-vascular:  No chest pain, Orthopnea, PND, anasarca, dizziness, palpitations.no Bilateral lower extremity swelling  GI:  No heartburn, indigestion, abdominal pain, nausea, vomiting, diarrhea, change in bowel habits, loss of appetite, melena, blood in stool, hematemesis Resp:  no shortness of breath at rest. No dyspnea on exertion, No excess mucus, no productive cough, No non-productive cough, No coughing up of blood.No change in color of mucus.No wheezing. Skin:  no rash or lesions. No jaundice GU:  no dysuria, change in color of urine, no urgency or frequency. No straining to urinate.  No flank pain.  Musculoskeletal:  No joint pain or no joint swelling. No decreased range of motion. No back pain.  Psych:  No change in mood or affect. No depression or anxiety. No memory loss.  Neuro: no localizing neurological complaints, no tingling, no weakness, no double vision, no gait abnormality, no slurred speech, no confusion  All systems reviewed and apart from [place] all are negative _______________________________________________________________________________________________ Past Medical History:   Past Medical History:  Diagnosis Date   Colon polyps    Complication of anesthesia    tolerated propofol but other meds made her  emotional / cry/ difficulty breathing/ panic   Hyperlipidemia    Rheumatic fever    Seasonal allergies    UTI (urinary tract infection)       Past Surgical History:  Procedure Laterality Date   BRONCHIAL BIOPSY  06/08/2021   Procedure: BRONCHIAL BIOPSIES;  Surgeon: Garner Nash, DO;  Location: [facility];  Service: Pulmonary;;   BRONCHIAL BRUSHINGS  06/08/2021   Procedure: BRONCHIAL BRUSHINGS;  Surgeon: Garner Nash, DO;  Location: [facility];  Service: Pulmonary;;   BRONCHIAL NEEDLE ASPIRATION BIOPSY  06/08/2021   Procedure: BRONCHIAL NEEDLE ASPIRATION BIOPSIES;  Surgeon: Garner Nash, DO;  Location: [facility];  Service: Pulmonary;;   COLONOSCOPY  2018   ERCP N/A 11/05/2021   Procedure: ENDOSCOPIC RETROGRADE CHOLANGIOPANCREATOGRAPHY (ERCP);  Surgeon: Irene Shipper, MD;  Location: [facility];  Service: Gastroenterology;  Laterality: N/A;   IR IMAGING GUIDED PORT INSERTION  08/12/2021   REMOVAL OF STONES  11/05/2021   Procedure: REMOVAL OF STONES;  Surgeon: Irene Shipper, MD;  Location: [facility];  Service: Gastroenterology;;   SALIVARY GLAND SURGERY     LATE 80S   SPHINCTEROTOMY  11/05/2021  Procedure: SPHINCTEROTOMY;  Surgeon: Irene Shipper, MD;  Location: [facility];  Service: Gastroenterology;;   TUBAL LIGATION  1986   VIDEO BRONCHOSCOPY WITH RADIAL ENDOBRONCHIAL ULTRASOUND  06/08/2021   Procedure: VIDEO BRONCHOSCOPY WITH RADIAL ENDOBRONCHIAL ULTRASOUND;  Surgeon: Garner Nash, DO;  Location: [facility];  Service: Pulmonary;;    Social History:  Ambulatory   walker       reports that she has been smoking cigarettes. She has a 40.00 pack-year smoking history. She has never been exposed to tobacco smoke. She has never used smokeless tobacco. She reports current alcohol use. She reports that she does not use drugs.     Family History:   Family History  Problem Relation Age of Onset   Arthritis Mother    Diabetes Mother    Heart disease Mother     Hyperlipidemia Mother    Hypertension Mother    Miscarriages / [place] Mother    Hearing loss Father    Liver cancer Father    Lung cancer Father    Hyperlipidemia Father    Diabetes Brother    Lung disease Maternal Grandfather        Black Lung   Diabetes Paternal Grandmother    Cancer Paternal Grandfather    Diabetes Paternal Grandfather    ______________________________________________________________________________________________ Allergies: Allergies  Allergen Reactions   Codeine Itching     Prior to Admission medications   Medication Sig Start Date End Date Taking? Authorizing Provider  acetaminophen (TYLENOL) 500 MG tablet Take 500 mg by mouth every 6 (six) hours as needed for moderate pain.   Yes [provider]  adagrasib (KRAZATI) 200 MG tablet Take 3 tablets (600 mg total) by mouth 2 (two) times daily. 03/02/22  Yes Curt Bears, MD  albuterol (VENTOLIN HFA) 108 (90 Base) MCG/ACT inhaler Inhale 2 puffs into the lungs every 6 (six) hours as needed for wheezing or shortness of breath. 01/28/22  Yes Icard, Octavio Graves, DO  apixaban (ELIQUIS) 2.5 MG TABS tablet Take 1 tablet (2.5 mg total) by mouth 2 (two) times daily. Start this treatment once you receive the targeted therapy with Alfred Levins (Adagrasib).  Continue the other Eliquis 5 mg twice daily 03/02/22  Yes Curt Bears, MD  Budeson-Glycopyrrol-Formoterol (BREZTRI AEROSPHERE) 160-9-4.8 MCG/ACT AERO Inhale 2 puffs into the lungs in the morning and at bedtime. 01/28/22  Yes Icard, Bradley L, DO  FeFum-FePoly-FA-B Cmp-C-Biot (INTEGRA PLUS) CAPS Take 1 capsule by mouth every morning. Patient taking differently: Take 1 capsule by mouth 2 (two) times a week. 04/05/22  Yes Heilingoetter, Cassandra L, PA-C  lidocaine-prilocaine (EMLA) cream Apply to the Port-A-Cath site 30 minutes before chemotherapy Patient taking differently: Apply 1 Application topically daily as needed. Apply to the Port-A-Cath site 30 minutes  before chemotherapy 08/03/21  Yes Curt Bears, MD  magnesium oxide (MAG-OX) 400 (240 Mg) MG tablet Take 1 tablet (400 mg total) by mouth daily. 04/05/22 06/04/22 Yes Heilingoetter, Cassandra L, PA-C  omeprazole (PRILOSEC) 20 MG capsule Take 1 capsule (20 mg total) by mouth daily. 11/20/17  Yes Nafziger, Tommi Rumps, NP  ondansetron (ZOFRAN-ODT) 4 MG disintegrating tablet Dissolve 1 tablet (4 mg total) by mouth every 8 (eight) hours as needed for nausea or vomiting. 04/06/22  Yes Curt Bears, MD  potassium chloride SA (KLOR-CON M) 20 MEQ tablet Take 20 mEq by mouth daily.   Yes [provider]  prochlorperazine (COMPAZINE) 10 MG tablet Take 1 tablet (10 mg total) by mouth every 6 (six) hours as needed for nausea  or vomiting. 08/03/21  Yes Curt Bears, MD  megestrol (MEGACE) 20 MG tablet Take 1 tablet (20 mg total) by mouth daily. 04/05/22   Heilingoetter, Cassandra L, PA-C  potassium chloride SA (KLOR-CON M) 20 MEQ tablet Take 1 tablet (20 mEq total) by mouth 2 (two) times daily for 7 days. 03/29/22 04/05/22  Barrie Folk, PA-C    ___________________________________________________________________________________________________ Physical Exam:    04/09/2022   10:20 PM 04/09/2022   10:00 PM 04/09/2022    9:45 PM  Vitals with BMI  Systolic 98    Diastolic 62    Pulse 95 91 90     1. General:  in No  Acute distress   Chronically ill  -appearing 2. Psychological: Alert and   Oriented 3. Head/ENT:   Dry Mucous Membranes                          Head Non traumatic, neck supple                         Poor Dentition 4. SKIN: decreased Skin turgor,  Skin clean Dry and intact no rash 5. Heart: Regular rate and rhythm no  Murmur, no Rub or gallop 6. Lungs: , no wheezes or crackles   7. Abdomen: Soft, RUQ-tender, Non distended  bowel sounds present 8. Lower extremities: no clubbing, cyanosis, no  edema 9. Neurologically Grossly intact, moving all 4 extremities equally   10.  MSK: Normal range of motion    Chart has been reviewed  ______________________________________________________________________________________________  Assessment/Plan 67 y.o. female with medical history significant of stage IV lung cancer now on Krazi, hx of PE on Eliquis, anemia  Admitted for   Dehydration, Hypokalemia. Hypomagnesemia,  Anemia, unspecified type     Present on Admission:  Dehydration  Adenocarcinoma of right lung, stage 4 (HCC)  History of pulmonary embolus (PE)  Hypokalemia  COPD (chronic obstructive pulmonary disease) (HCC)  Hypomagnesemia  Symptomatic anemia  Elevated LFTs  Debility   Adenocarcinoma of right lung, stage 4 (HCC) Emailed oncology to let them know that Patient has been admitted  Dehydration Rehydrate and follow fluid status  History of pulmonary embolus (PE) Patient was on Eliquis but her hemoglobin seems to be trending down.  She is not reporting any blood per stool or melena.  But will need to be careful regarding this difficult situation as patient is certainly still at risk for recurrent blood clots appreciate hematology oncology consult  Hypokalemia Replace and continue to follow magnesium and phosphate levels.  Replace all as needed  COPD (chronic obstructive pulmonary disease) ([place]) Continue home medications currently not acute  Hypomagnesemia Replace and recheck in a.m.  Symptomatic anemia This point hemoglobin 7.6 but I suspect this will continue to trend down with IV fluid rehydration.  Patient likely will need blood transfusion.  Ordered type and screen repeat CBC and transfuse as needed if hemoglobin continues to trend patient is symptomatic and or hemoglobin below 7  Elevated LFTs Right upper quadrant ReSound showing hepatic steatosis but no evidence of cholecystitis  Debility Pt/ ot eval prior to dc   Other plan as per orders.  DVT prophylaxis: eliquis     Code Status:    Code Status: Prior FULL CODE  as per  patient   I had personally discussed CODE STATUS with patient     Family Communication:   Family not at  Bedside    Disposition  Plan:      To home once workup is complete and patient is stable   Following barriers for discharge:                            Electrolytes corrected                                                         Will need consultants to evaluate patient prior to discharge                        Would benefit from PT/OT eval prior to DC  Ordered                                                        Transition of care consulted                   Nutrition    consulted                                     Consults called: emailed oncology   Admission status:  ED Disposition     ED Disposition  [place]: [place] [[id number]]  Level of Care: Progressive [102]  Admit to Progressive based on following criteria: CARDIOVASCULAR & THORACIC of moderate stability with acute coronary syndrome symptoms/low risk myocardial infarction/hypertensive urgency/arrhythmias/heart failure potentially compromising stability and stable post cardiovascular intervention patients.  May place patient in observation at [facility] or [place] if equivalent level of care is available:: No  Covid Evaluation: Asymptomatic - no recent exposure (last 10 days) testing not required  Diagnosis: Dehydration [276.51.ICD-9-CM]  Admitting Physician: Toy Baker [[id number]]  Attending Physician: Toy Baker [[id number]]           Obs       Level of care     progressive tele indefinitely please discontinue once patient no longer qualifies COVID-19 Labs    Lab Results  Component Value Date   [place] NEGATIVE 02/17/2022     Madelyn Tlatelpa 04/09/2022, 11:54 PM    [facility]     after 2 AM please page floor coverage PA If 7AM-7PM, please contact the day team taking care of the patient  using [email or web address]   Patient was evaluated in the context of the global COVID-19 pandemic, which necessitated consideration that the patient might be at risk for infection with the SARS-CoV-2 virus that causes COVID-19. Institutional protocols and algorithms that pertain to the evaluation of patients at risk for COVID-19 are in a state of rapid change based on information released by regulatory bodies including the CDC and federal and state organizations. These policies and algorithms were followed during the patient's care.

## 2022-04-09 NOTE — Assessment & Plan Note (Signed)
Emailed oncology to let them know that Patient has been admitted

## 2022-04-09 NOTE — Assessment & Plan Note (Signed)
Rehydrate and follow fluid status

## 2022-04-09 NOTE — Assessment & Plan Note (Signed)
Replace and recheck in a.m. 

## 2022-04-09 NOTE — Assessment & Plan Note (Signed)
Replace and continue to follow magnesium and phosphate levels.  Replace all as needed

## 2022-04-09 NOTE — ED Triage Notes (Signed)
Pt arrived via POV with family. C/o increasing weakness and just overall feeling tired. Has been nauseated since starting new medication x3 wks, nauseated has subsided but has not had much of an appetite.

## 2022-04-09 NOTE — Assessment & Plan Note (Signed)
Continue home medications currently not acute

## 2022-04-09 NOTE — ED Provider Notes (Incomplete)
[facility] AT [facility] Provider Note   CSN: [id number] Arrival date & time: 04/09/22  1726     History  Chief Complaint  Patient presents with  . Fatigue    Sue Chase is a 67 y.o. female.  HPI   67 year old female with medical history significant for stage IV non-small cell lung adenocarcinoma on chemotherapy who presents to the emergency department with generalized fatigue.    The patient was last seen by her oncologist on 04/05/2022.  Per notes, " her treatment was switched to targeted treatment with Alfred Levins which she started on 03/08/2022.  She tolerated this well for the first 2 weeks of treatment and then developed nausea, vomiting, diarrhea, and weight loss recently was seen in symptom management clinic last week on 2/6 for these concerns.  She received Zofran at home in addition to use with her Compazine for nausea and vomiting.  She received IV fluids.  She also had some electrolyte derangements which were replaced.  She was prescribed magnesium supplements but didn't know that she was supposed to take these and did not pick them up. She uses Imodium for diarrhea.  Since being seen in the symptom management clinic, her symptoms of nausea/vomiting are under better control with taking Zofran.  Her diarrhea is also improved with taking Imodium.  However her fatigue, generalized weakness, and poor appetite has not improved.   Patient is more anemic on labs.  Patient is no longer taking Integra plus based on her most recent iron studies.  The patient denies any abnormal bleeding or bruising including epistaxis, gingival bleeding, hemoptysis, hematemesis, melena, or hematochezia.  Of note she is on Eliquis.    Guarding her appetite, the patient already previously saw member the nutritionist team.  It appears she lost 10 pounds since last being seen.  The patient reports taste alterations, nausea, and decreased appetite altogether contributing to  her weight loss."  She was also noted to be hypotensive and encouraged hydration at home.  She was found to be hypomagnesemic and hypokalemic.  She received 1 L of IV fluid in the infusion room and 20 mill equivalents of potassium and 4 g of magnesium.  She also has noted to have a worsening anemia.  Home Medications Prior to Admission medications   Medication Sig Start Date End Date Taking? Authorizing Provider  acetaminophen (TYLENOL) 500 MG tablet Take 500 mg by mouth every 6 (six) hours as needed for moderate pain.    [provider]  adagrasib (KRAZATI) 200 MG tablet Take 3 tablets (600 mg total) by mouth 2 (two) times daily. 03/02/22   Curt Bears, MD  albuterol (VENTOLIN HFA) 108 (90 Base) MCG/ACT inhaler Inhale 2 puffs into the lungs every 6 (six) hours as needed for wheezing or shortness of breath. 01/28/22   Icard, Octavio Graves, DO  apixaban (ELIQUIS) 2.5 MG TABS tablet Take 1 tablet (2.5 mg total) by mouth 2 (two) times daily. Start this treatment once you receive the targeted therapy with Alfred Levins (Adagrasib).  Continue the other Eliquis 5 mg twice daily 03/02/22   Curt Bears, MD  Budeson-Glycopyrrol-Formoterol (BREZTRI AEROSPHERE) 160-9-4.8 MCG/ACT AERO Inhale 2 puffs into the lungs in the morning and at bedtime. 01/28/22   Icard, Octavio Graves, DO  FeFum-FePoly-FA-B Cmp-C-Biot (INTEGRA PLUS) CAPS Take 1 capsule by mouth every morning. 04/05/22   Heilingoetter, Cassandra L, PA-C  lidocaine-prilocaine (EMLA) cream Apply to the Port-A-Cath site 30 minutes before chemotherapy Patient taking differently: Apply 1 Application  topically daily as needed. Apply to the Port-A-Cath site 30 minutes before chemotherapy 08/03/21   Curt Bears, MD  magnesium oxide (MAG-OX) 400 (240 Mg) MG tablet Take 1 tablet (400 mg total) by mouth daily. 04/05/22 06/04/22  Heilingoetter, Cassandra L, PA-C  megestrol (MEGACE) 20 MG tablet Take 1 tablet (20 mg total) by mouth daily. 04/05/22   Heilingoetter,  Cassandra L, PA-C  nicotine (NICODERM CQ - DOSED IN MG/24 HOURS) 14 mg/24hr patch 14 mg daily as needed (nicotene depedence). 11/30/21   [provider]  omeprazole (PRILOSEC) 20 MG capsule Take 1 capsule (20 mg total) by mouth daily. 11/20/17   Nafziger, Tommi Rumps, NP  ondansetron (ZOFRAN-ODT) 4 MG disintegrating tablet Dissolve 1 tablet (4 mg total) by mouth every 8 (eight) hours as needed for nausea or vomiting. 04/06/22   Curt Bears, MD  potassium chloride SA (KLOR-CON M) 20 MEQ tablet Take 1 tablet (20 mEq total) by mouth 2 (two) times daily for 7 days. 03/29/22 04/05/22  Barrie Folk, PA-C  prochlorperazine (COMPAZINE) 10 MG tablet Take 1 tablet (10 mg total) by mouth every 6 (six) hours as needed for nausea or vomiting. 08/03/21   Curt Bears, MD  [facility] 160-4.5 MCG/ACT inhaler Inhale 2 puffs into the lungs in the morning and at bedtime. 01/28/22   [provider]      Allergies    Codeine    Review of Systems   Review of Systems  Physical Exam Updated Vital Signs BP 103/64   Pulse 80   Temp 98 F (36.7 C) (Oral)   Resp 16   SpO2 100%  Physical Exam  ED Results / Procedures / Treatments   Labs (all labs ordered are listed, but only abnormal results are displayed) Labs Reviewed - No data to display  EKG None  Radiology No results found.  Procedures Procedures  {Document cardiac monitor, telemetry assessment procedure when appropriate:1}  Medications Ordered in ED Medications - No data to display  ED Course/ Medical Decision Making/ A&P Clinical Course as of 04/09/22 2044  Sat Apr 09, 2022  2032 Potassium(!!): 2.7 [JL]  2032 Magnesium(!): 1.5 [JL]  2032 Hemoglobin(!): 7.6 [JL]    Clinical Course User Index [JL] Regan Lemming, MD   {   Click here for ABCD2, HEART and other calculatorsREFRESH Note before signing :1}                          Medical Decision Making Amount and/or Complexity of Data Reviewed Labs: ordered.  Decision-making details documented in ED Course. Radiology: ordered.  Risk OTC drugs. Prescription drug management.   ***  {Document critical care time when appropriate:1} {Document review of labs and clinical decision tools ie heart score, Chads2Vasc2 etc:1}  {Document your independent review of radiology images, and any outside records:1} {Document your discussion with family members, caretakers, and with consultants:1} {Document social determinants of health affecting pt's care:1} {Document your decision making why or why not admission, treatments were needed:1} Final Clinical Impression(s) / ED Diagnoses Final diagnoses:  None    Rx / DC Orders ED Discharge Orders     None

## 2022-04-10 ENCOUNTER — Other Ambulatory Visit: Payer: Self-pay

## 2022-04-10 DIAGNOSIS — E860 Dehydration: Secondary | ICD-10-CM | POA: Diagnosis not present

## 2022-04-10 DIAGNOSIS — R636 Underweight: Secondary | ICD-10-CM | POA: Insufficient documentation

## 2022-04-10 LAB — URINALYSIS, COMPLETE (UACMP) WITH MICROSCOPIC
Glucose, UA: NEGATIVE mg/dL
Hgb urine dipstick: NEGATIVE
Ketones, ur: NEGATIVE mg/dL
Leukocytes,Ua: NEGATIVE
Nitrite: NEGATIVE
Protein, ur: 30 mg/dL — AB
Specific Gravity, Urine: 1.013 (ref 1.005–1.030)
pH: 5 (ref 5.0–8.0)

## 2022-04-10 LAB — BASIC METABOLIC PANEL
Anion gap: 8 (ref 5–15)
BUN: 22 mg/dL (ref 8–23)
CO2: 22 mmol/L (ref 22–32)
Calcium: 7.3 mg/dL — ABNORMAL LOW (ref 8.9–10.3)
Chloride: 99 mmol/L (ref 98–111)
Creatinine, Ser: 1.38 mg/dL — ABNORMAL HIGH (ref 0.44–1.00)
GFR, Estimated: 42 mL/min — ABNORMAL LOW (ref >60)
Glucose, Bld: 88 mg/dL (ref 70–99)
Potassium: 3.3 mmol/L — ABNORMAL LOW (ref 3.5–5.1)
Sodium: 129 mmol/L — ABNORMAL LOW (ref 135–145)

## 2022-04-10 LAB — COMPREHENSIVE METABOLIC PANEL
ALT: 60 U/L — ABNORMAL HIGH (ref 0–44)
ALT: 60 U/L — ABNORMAL HIGH (ref 0–44)
AST: 132 U/L — ABNORMAL HIGH (ref 15–41)
AST: 133 U/L — ABNORMAL HIGH (ref 15–41)
Albumin: 2.2 g/dL — ABNORMAL LOW (ref 3.5–5.0)
Albumin: 2.2 g/dL — ABNORMAL LOW (ref 3.5–5.0)
Alkaline Phosphatase: 248 U/L — ABNORMAL HIGH (ref 38–126)
Alkaline Phosphatase: 243 U/L — ABNORMAL HIGH (ref 38–126)
Anion gap: 5 (ref 5–15)
Anion gap: 9 (ref 5–15)
BUN: 21 mg/dL (ref 8–23)
BUN: 18 mg/dL (ref 8–23)
CO2: 22 mmol/L (ref 22–32)
CO2: 24 mmol/L (ref 22–32)
Calcium: 7.2 mg/dL — ABNORMAL LOW (ref 8.9–10.3)
Calcium: 7.2 mg/dL — ABNORMAL LOW (ref 8.9–10.3)
Chloride: 102 mmol/L (ref 98–111)
Chloride: 102 mmol/L (ref 98–111)
Creatinine, Ser: 1.35 mg/dL — ABNORMAL HIGH (ref 0.44–1.00)
Creatinine, Ser: 1.34 mg/dL — ABNORMAL HIGH (ref 0.44–1.00)
GFR, Estimated: 43 mL/min — ABNORMAL LOW (ref >60)
GFR, Estimated: 44 mL/min — ABNORMAL LOW (ref >60)
Glucose, Bld: 98 mg/dL (ref 70–99)
Glucose, Bld: 87 mg/dL (ref 70–99)
Potassium: 3.1 mmol/L — ABNORMAL LOW (ref 3.5–5.1)
Potassium: 3.2 mmol/L — ABNORMAL LOW (ref 3.5–5.1)
Sodium: 129 mmol/L — ABNORMAL LOW (ref 135–145)
Sodium: 135 mmol/L (ref 135–145)
Total Bilirubin: 0.9 mg/dL (ref 0.3–1.2)
Total Bilirubin: 0.9 mg/dL (ref 0.3–1.2)
Total Protein: 4.8 g/dL — ABNORMAL LOW (ref 6.5–8.1)
Total Protein: 4.8 g/dL — ABNORMAL LOW (ref 6.5–8.1)

## 2022-04-10 LAB — CBC
HCT: 19.8 % — ABNORMAL LOW (ref 36.0–46.0)
HCT: 19.5 % — ABNORMAL LOW (ref 36.0–46.0)
HCT: 22.9 % — ABNORMAL LOW (ref 36.0–46.0)
HCT: 25.6 % — ABNORMAL LOW (ref 36.0–46.0)
Hemoglobin: 6.6 g/dL — CL (ref 12.0–15.0)
Hemoglobin: 6.5 g/dL — CL (ref 12.0–15.0)
Hemoglobin: 7.7 g/dL — ABNORMAL LOW (ref 12.0–15.0)
Hemoglobin: 8.6 g/dL — ABNORMAL LOW (ref 12.0–15.0)
MCH: 31.3 pg (ref 26.0–34.0)
MCH: 31.3 pg (ref 26.0–34.0)
MCH: 30.9 pg (ref 26.0–34.0)
MCH: 31.2 pg (ref 26.0–34.0)
MCHC: 33.3 g/dL (ref 30.0–36.0)
MCHC: 33.3 g/dL (ref 30.0–36.0)
MCHC: 33.6 g/dL (ref 30.0–36.0)
MCHC: 33.6 g/dL (ref 30.0–36.0)
MCV: 93.8 fL (ref 80.0–100.0)
MCV: 93.8 fL (ref 80.0–100.0)
MCV: 92 fL (ref 80.0–100.0)
MCV: 92.8 fL (ref 80.0–100.0)
Platelets: 154 10*3/uL (ref 150–400)
Platelets: 150 10*3/uL (ref 150–400)
Platelets: 150 10*3/uL (ref 150–400)
Platelets: 143 10*3/uL — ABNORMAL LOW (ref 150–400)
RBC: 2.11 MIL/uL — ABNORMAL LOW (ref 3.87–5.11)
RBC: 2.08 MIL/uL — ABNORMAL LOW (ref 3.87–5.11)
RBC: 2.49 MIL/uL — ABNORMAL LOW (ref 3.87–5.11)
RBC: 2.76 MIL/uL — ABNORMAL LOW (ref 3.87–5.11)
RDW: 17.5 % — ABNORMAL HIGH (ref 11.5–15.5)
RDW: 17.3 % — ABNORMAL HIGH (ref 11.5–15.5)
RDW: 16.8 % — ABNORMAL HIGH (ref 11.5–15.5)
RDW: 17.1 % — ABNORMAL HIGH (ref 11.5–15.5)
WBC: 5.5 10*3/uL (ref 4.0–10.5)
WBC: 5 10*3/uL (ref 4.0–10.5)
WBC: 5.4 10*3/uL (ref 4.0–10.5)
WBC: 5.4 10*3/uL (ref 4.0–10.5)
nRBC: 0 % (ref 0.0–0.2)
nRBC: 0 % (ref 0.0–0.2)
nRBC: 0 % (ref 0.0–0.2)
nRBC: 0 % (ref 0.0–0.2)

## 2022-04-10 LAB — FOLATE: Folate: 14.6 ng/mL (ref >5.9)

## 2022-04-10 LAB — TSH: TSH: 2.867 u[IU]/mL (ref 0.350–4.500)

## 2022-04-10 LAB — RETICULOCYTES
Immature Retic Fract: 14.5 % (ref 2.3–15.9)
RBC.: 2.09 MIL/uL — ABNORMAL LOW (ref 3.87–5.11)
Retic Count, Absolute: 75.2 10*3/uL (ref 19.0–186.0)
Retic Ct Pct: 3.6 % — ABNORMAL HIGH (ref 0.4–3.1)

## 2022-04-10 LAB — CK: Total CK: 29 U/L — ABNORMAL LOW (ref 38–234)

## 2022-04-10 LAB — PROTIME-INR
INR: 1.2 (ref 0.8–1.2)
Prothrombin Time: 15 seconds (ref 11.4–15.2)

## 2022-04-10 LAB — FERRITIN: Ferritin: >7500 ng/mL — ABNORMAL HIGH (ref 11–307)

## 2022-04-10 LAB — LACTIC ACID, PLASMA
Lactic Acid, Venous: 1.2 mmol/L (ref 0.5–1.9)
Lactic Acid, Venous: 0.7 mmol/L (ref 0.5–1.9)

## 2022-04-10 LAB — IRON AND TIBC
Iron: 44 ug/dL (ref 28–170)
Saturation Ratios: 22 % (ref 10.4–31.8)
TIBC: 204 ug/dL — ABNORMAL LOW (ref 250–450)
UIBC: 160 ug/dL

## 2022-04-10 LAB — PHOSPHORUS
Phosphorus: 2.7 mg/dL (ref 2.5–4.6)
Phosphorus: 2.6 mg/dL (ref 2.5–4.6)

## 2022-04-10 LAB — MAGNESIUM
Magnesium: 1.8 mg/dL (ref 1.7–2.4)
Magnesium: 1.7 mg/dL (ref 1.7–2.4)

## 2022-04-10 LAB — PREPARE RBC (CROSSMATCH)

## 2022-04-10 LAB — VITAMIN B12: Vitamin B-12: 479 pg/mL (ref 180–914)

## 2022-04-10 LAB — HIV ANTIBODY (ROUTINE TESTING W REFLEX): HIV Screen 4th Generation wRfx: NONREACTIVE

## 2022-04-10 LAB — T4, FREE: Free T4: 1.07 ng/dL (ref 0.61–1.12)

## 2022-04-10 MED ORDER — ACETAMINOPHEN 325 MG PO TABS: 650.0000 mg | ORAL_TABLET | Freq: Four times a day (QID) | ORAL | Status: DC | PRN

## 2022-04-10 MED ORDER — SODIUM CHLORIDE 0.9% IV SOLUTION: Freq: Once | INTRAVENOUS | Status: AC

## 2022-04-10 MED ORDER — SODIUM CHLORIDE 0.9 % IV SOLN: INTRAVENOUS | Status: AC

## 2022-04-10 MED ORDER — ORAL CARE MOUTH RINSE: 15.0000 mL | OROMUCOSAL | Status: DC | PRN

## 2022-04-10 MED ORDER — HYDROCODONE-ACETAMINOPHEN 5-325 MG PO TABS
1.0000 | ORAL_TABLET | ORAL | Status: DC | PRN
  Administered 2022-04-10: 1 via ORAL
  Filled 2022-04-10: qty 1

## 2022-04-10 MED ORDER — POTASSIUM CHLORIDE CRYS ER 20 MEQ PO TBCR
80.0000 meq | EXTENDED_RELEASE_TABLET | Freq: Once | ORAL | Status: AC
  Administered 2022-04-10: 80 meq via ORAL
  Filled 2022-04-10: qty 4

## 2022-04-10 MED ORDER — ACETAMINOPHEN 650 MG RE SUPP: 650.0000 mg | Freq: Four times a day (QID) | RECTAL | Status: DC | PRN

## 2022-04-10 NOTE — Discharge Summary (Signed)
Physician Discharge Summary  Sue Chase BTD:[id number] DOB: January 30, 1956 DOA: 04/09/2022  PCP: [name], NP  Admit date: 04/09/2022 Discharge date: 04/10/2022 Discharging to: Home Recommendations for Outpatient Follow-up:  Continue to follow electrolytes and weight loss  Consults:  None    Discharge Diagnoses:   Principal Problem:   Dehydration Active Problems:   Hypokalemia   Hypomagnesemia   Adenocarcinoma of right lung, stage 4 (HCC)   History of pulmonary embolus (PE)   Symptomatic anemia   COPD (chronic obstructive pulmonary disease) (HCC)   Elevated LFTs   Debility   Underweight due to inadequate caloric intake     Hospital Course:  This is a 67 year old female with stage IV lung cancer who started [place] a couple of weeks ago.  She has had a poor appetite, nausea, occasional diarrhea followed by fatigue.  She was evaluated in the cancer center, found to have hypokalemia and hypomagnesia, dehydration and received replacement and IV fluids.  She was started on Megace.  She became weak again and therefore presented to the ED.  In the ED she was noted to have a sodium of 130, potassium 2.7, magnesium 1.5, AST 155, ALT 69.  She was also noted to have a hemoglobin of 6.6.  Principal Problem:   Dehydration, severe generalized weakness and electrolyte abnormalities - Sodium now 135, magnesium 1.7 but potassium still slightly low at 3.2 - She will receive 80 mEq of potassium and then can be discharged home - Potassium and magnesium supplements for prescribed as outpatient and she can continue this  Active Problems: Anemia, symptomatic - Hemoglobin 8.6 after 2 units of packed red blood cells -stool Hemoccult negative -continue to follow as outpatient  Poor appetite or underweight -BMI 17 - Started on Megace as outpatient  Nausea - Has resolved    Adenocarcinoma of right lung, stage 4 ([facility]) -Continue outpatient management    History of pulmonary embolus   -Continue DOAC    Elevated LFTs -Need to be followed as outpatient        Discharge Instructions  Discharge Instructions     Increase activity slowly   Complete by: As directed       Allergies as of 04/10/2022       Reactions   Codeine Itching        Medication List     TAKE these medications    acetaminophen 500 MG tablet Commonly known as: TYLENOL Take 500 mg by mouth every 6 (six) hours as needed for moderate pain.   albuterol 108 (90 Base) MCG/ACT inhaler Commonly known as: VENTOLIN HFA Inhale 2 puffs into the lungs every 6 (six) hours as needed for wheezing or shortness of breath.   apixaban 2.5 MG Tabs tablet Commonly known as: Eliquis Take 1 tablet (2.5 mg total) by mouth 2 (two) times daily. Start this treatment once you receive the targeted therapy with Alfred Levins (Adagrasib).  Continue the other Eliquis 5 mg twice daily   Breztri Aerosphere 160-9-4.8 MCG/ACT Aero Generic drug: Budeson-Glycopyrrol-Formoterol Inhale 2 puffs into the lungs in the morning and at bedtime.   Integra Plus Caps Take 1 capsule by mouth every morning. What changed: when to take this   Krazati 200 MG tablet Generic drug: adagrasib Take 3 tablets (600 mg total) by mouth 2 (two) times daily.   lidocaine-prilocaine cream Commonly known as: EMLA Apply to the Port-A-Cath site 30 minutes before chemotherapy What changed:  how much to take how to take this when to take this reasons  to take this   magnesium oxide 400 (240 Mg) MG tablet Commonly known as: MAG-OX Take 1 tablet (400 mg total) by mouth daily.   megestrol 20 MG tablet Commonly known as: MEGACE Take 1 tablet (20 mg total) by mouth daily.   omeprazole 20 MG capsule Commonly known as: PRILOSEC Take 1 capsule (20 mg total) by mouth daily.   ondansetron 4 MG disintegrating tablet Commonly known as: ZOFRAN-ODT Dissolve 1 tablet (4 mg total) by mouth every 8 (eight) hours as needed for nausea or vomiting.    potassium chloride SA 20 MEQ tablet Commonly known as: KLOR-CON M Take 20 mEq by mouth daily. What changed: Another medication with the same name was removed. Continue taking this medication, and follow the directions you see here.   prochlorperazine 10 MG tablet Commonly known as: COMPAZINE Take 1 tablet (10 mg total) by mouth every 6 (six) hours as needed for nausea or vomiting.        Follow-up Information     Nafziger, Tommi Rumps, NP Follow up.   Specialty: Family Medicine Why: Please obtain a Cmet, Mg+ level on Tuesday Contact information: [address] [place] [address] [phone]                    The results of significant diagnostics from this hospitalization (including imaging, microbiology, ancillary and laboratory) are listed below for reference.    US Abdomen Limited RUQ (LIVER/GB)  Result Date: 04/09/2022 CLINICAL DATA:  Elevated LFTs EXAM: ULTRASOUND ABDOMEN LIMITED RIGHT UPPER QUADRANT COMPARISON:  12/30/2021 CT, ultrasound from 11/03/2021 FINDINGS: Gallbladder: Gallbladder is decompressed accentuating the wall thickness. No pericholecystic fluid is noted. Negative sonographic Murphy's sign is elicited. No gallstones are seen. Common bile duct: Diameter: 3 mm Liver: Increase in echogenicity likely related to fatty infiltration. No focal mass is seen. Portal vein is patent on color Doppler imaging with normal direction of blood flow towards the liver. Other: None. IMPRESSION: Partially contracted gallbladder. Fatty infiltration of the liver. Electronically Signed   By: Inez Catalina M.D.   On: 04/09/2022 21:15   DG Chest Portable 1 View  Result Date: 04/09/2022 CLINICAL DATA:  Increasing weakness and fatigue EXAM: PORTABLE CHEST 1 VIEW COMPARISON:  02/17/2022 FINDINGS: Cardiac shadow is stable. Lungs are hyperinflated but clear. Right chest wall port is noted. Previously seen right upper lobe opacity is less prominent than on the prior exam. No new  focal abnormality is seen. IMPRESSION: Known right upper lobe opacity is less well visualized than on the prior exam. No new focal abnormality is noted. Electronically Signed   By: Inez Catalina M.D.   On: 04/09/2022 19:24   Labs:   Basic Metabolic Panel: Recent Labs  Lab 04/05/22 0809 04/09/22 1912 04/10/22 0105 04/10/22 0340 04/10/22 1302  NA 131* 130* 129* 129* 135  K 3.3* 2.7* 3.3* 3.1* 3.2*  CL 95* 97* 99 102 102  CO2 25 23 22 22 24   GLUCOSE 99 113* 88 98 87  BUN 30* 23 22 21 18   CREATININE 1.98* 1.49* 1.38* 1.35* 1.34*  CALCIUM 8.0* 7.7* 7.3* 7.2* 7.2*  MG 1.2* 1.5* 1.8 1.7  --   PHOS  --   --  2.7 2.6  --      CBC: Recent Labs  Lab 04/05/22 0809 04/09/22 1912 04/10/22 0105 04/10/22 0340 04/10/22 1023 04/10/22 1252  WBC 5.4 6.3 5.5 5.0 5.4 5.4  NEUTROABS 3.8 4.0  --   --   --   --  HGB 8.5* 7.6* 6.6* 6.5* 7.7* 8.6*  HCT 23.8* 22.8* 19.8* 19.5* 22.9* 25.6*  MCV 89.5 93.8 93.8 93.8 92.0 92.8  PLT 184 166 154 150 150 143*         SIGNED:   Debbe Odea, MD  [facility] 04/10/2022, 4:41 PM

## 2022-04-10 NOTE — Evaluation (Signed)
Occupational Therapy Evaluation Patient Details Name: Sue Chase MRN: [id number] DOB: 20-Feb-1956 Today's Date: 04/10/2022   History of Present Illness Patient is a 67 year old female who presented on 2/17 with generalized fatigue. Patient was found to have low hgb with one unit PRBCs transfused. Patient was also noted to be dehydrated. PMH: adenocarcinoma of R lung stage 4, h/o PE,   Clinical Impression   Patient is a 67 year old female who was admitted for above. Patient appears to be near baseline for ability to participate in  ADLs with decreased functional activity tolerance. Patient lives at home alone with PRN support from daughter. Patient would continue to benefit from skilled OT services at this time while admitted to address noted deficits in order to improve overall safety and independence in ADLs.        Recommendations for follow up therapy are one component of a multi-disciplinary discharge planning process, led by the attending physician.  Recommendations may be updated based on patient status, additional functional criteria and insurance authorization.   Follow Up Recommendations  No OT follow up     Assistance Recommended at Discharge Frequent or constant Supervision/Assistance  Patient can return home with the following Assistance with cooking/housework;Direct supervision/assist for medications management;Assist for transportation;Help with stairs or ramp for entrance;Direct supervision/assist for financial management    Functional Status Assessment  Patient has had a recent decline in their functional status and demonstrates the ability to make significant improvements in function in a reasonable and predictable amount of time.  Equipment Recommendations  None recommended by OT       Precautions / Restrictions Restrictions Weight Bearing Restrictions: No      Mobility Bed Mobility               General bed mobility comments: patient was up with  nurse upon entrance to room and transitioned to recliner    Transfers Overall transfer level: Needs assistance Equipment used: None Transfers: Sit to/from Stand, Bed to chair/wheelchair/BSC Sit to Stand: Min guard           General transfer comment: to transfer from commode to bed and then to recliner with increased time      Balance Overall balance assessment: Mild deficits observed, not formally tested         ADL either performed or assessed with clinical judgement   ADL Overall ADL's : Needs assistance/impaired Eating/Feeding: Modified independent;Sitting   Grooming: Supervision/safety;Standing;Wash/dry hands Grooming Details (indicate cue type and reason): at sink in bathroom Upper Body Bathing: Set up;Sitting   Lower Body Bathing: Set up;Sitting/lateral leans   Upper Body Dressing : Set up;Sitting   Lower Body Dressing: Set up;Sitting/lateral leans   Toilet Transfer: Min guard;Ambulation Toilet Transfer Details (indicate cue type and reason): with no AD with increased time Toileting- Water quality scientist and Hygiene: Min guard;Sit to/from stand Toileting - Clothing Manipulation Details (indicate cue type and reason): with increased time.       General ADL Comments: patient's session was limited with increased fatigue and patient was chilly requesting to get back into bed.     Vision Baseline Vision/History: 1 Wears glasses Additional Comments: for reading            Pertinent Vitals/Pain Pain Assessment Pain Assessment: 0-10 Pain Score: 5  Pain Location: back     Hand Dominance Right   Extremity/Trunk Assessment Upper Extremity Assessment Upper Extremity Assessment: Overall WFL for tasks assessed   Lower Extremity Assessment Lower Extremity Assessment: Defer  to PT evaluation   Cervical / Trunk Assessment Cervical / Trunk Assessment: Kyphotic   Communication Communication Communication: No difficulties   Cognition Arousal/Alertness:  Awake/alert Behavior During Therapy: WFL for tasks assessed/performed Overall Cognitive Status: Within Functional Limits for tasks assessed                    Home Living Family/patient expects to be discharged to:: Private residence Living Arrangements: Alone Available Help at Discharge: Family;Available PRN/intermittently Type of Home: Apartment Home Access: Level entry     Home Layout: One level     Bathroom Shower/Tub: Tub/shower unit         Home Equipment: Conservation officer, nature (2 wheels);Shower seat          Prior Functioning/Environment Prior Level of Function : Independent/Modified Independent;Driving            OT Problem List: Decreased activity tolerance;Impaired balance (sitting and/or standing);Decreased safety awareness;Decreased knowledge of precautions;Pain      OT Treatment/Interventions: Self-care/ADL training;Energy conservation;Therapeutic exercise;DME and/or AE instruction;Therapeutic activities;Patient/family education;Balance training    OT Goals(Current goals can be found in the care plan section) Acute Rehab OT Goals Patient Stated Goal: to go home today OT Goal Formulation: With patient Time For Goal Achievement: 04/24/22 Potential to Achieve Goals: Fair  OT Frequency: Min 2X/week       AM-PAC OT "6 Clicks" Daily Activity     Outcome Measure Help from another person eating meals?: None Help from another person taking care of personal grooming?: A Little Help from another person toileting, which includes using toliet, bedpan, or urinal?: A Little Help from another person bathing (including washing, rinsing, drying)?: A Little Help from another person to put on and taking off regular upper body clothing?: A Little Help from another person to put on and taking off regular lower body clothing?: A Little 6 Click Score: 19   End of Session Nurse Communication: Other (comment) (ok to participate in session)  Activity Tolerance: Patient  tolerated treatment well Patient left: with call bell/phone within reach;in chair;with chair alarm set  OT Visit Diagnosis: Unsteadiness on feet (R26.81)                Time: 8115-7262 OT Time Calculation (min): 17 min Charges:  OT General Charges $OT Visit: 1 Visit OT Evaluation $OT Eval Low Complexity: 1 Low  Raymund Manrique OTR/L, MS Acute Rehabilitation Department Office# [phone]   Willa Rough 04/10/2022, 1:05 PM

## 2022-04-10 NOTE — Plan of Care (Signed)
  Problem: Clinical Measurements: Goal: Will remain free from infection Outcome: Progressing   Problem: Clinical Measurements: Goal: Diagnostic test results will improve Outcome: Progressing   Problem: Activity: Goal: Risk for activity intolerance will decrease Outcome: Progressing   Problem: Coping: Goal: Level of anxiety will decrease Outcome: Progressing   Problem: Safety: Goal: Ability to remain free from injury will improve Outcome: Progressing   Problem: Pain Managment: Goal: General experience of comfort will improve Outcome: Progressing

## 2022-04-10 NOTE — Evaluation (Signed)
Physical Therapy One Time Evaluation Patient Details Name: Sue Chase MRN: [id number] DOB: [birth year] Today's Date: 04/10/2022  History of Present Illness  Patient is a 67 year old female who presented on 2/17 with generalized fatigue. Patient was found to have low hgb with one unit PRBCs transfused. Patient was also noted to be dehydrated. PMH: adenocarcinoma of R lung stage 4, h/o PE  Clinical Impression  Patient evaluated by Physical Therapy with no further acute PT needs identified. All education has been completed and the patient has no further questions.   Pt reports frustration as she wants to be at home.  Pt was able to ambulate in hallway (see mobility section below) and hopeful for d/c home soon.  Pt agreeable to mobilize during acute stay with staff.  See below for any follow-up Physical Therapy or equipment needs. PT is signing off. Thank you for this referral.        Recommendations for follow up therapy are one component of a multi-disciplinary discharge planning process, led by the attending physician.  Recommendations may be updated based on patient status, additional functional criteria and insurance authorization.  Follow Up Recommendations No PT follow up      Assistance Recommended at Discharge    Patient can return home with the following       Equipment Recommendations None recommended by PT  Recommendations for Other Services       Functional Status Assessment Patient has not had a recent decline in their functional status     Precautions / Restrictions Precautions Precautions: Fall Restrictions Weight Bearing Restrictions: No      Mobility  Bed Mobility               General bed mobility comments: pt in recliner    Transfers Overall transfer level: Needs assistance Equipment used: None Transfers: Sit to/from Stand Sit to Stand: Supervision                Ambulation/Gait Ambulation/Gait assistance: Min guard Gait Distance  (Feet): 160 Feet Assistive device: None Gait Pattern/deviations: Step-through pattern, Decreased stride length       General Gait Details: pt kept blanket wrapped around shoulders with arms hugging herself, one instance of LOB with looking left however pt self corrected  Stairs            Wheelchair Mobility    Modified Rankin (Stroke Patients Only)       Balance Overall balance assessment: Mild deficits observed, not formally tested                                           Pertinent Vitals/Pain Pain Assessment Pain Assessment: 0-10 Pain Score: 4  Pain Location: back Pain Descriptors / Indicators: Sore Pain Intervention(s): Monitored during session, Repositioned    Home Living Family/patient expects to be discharged to:: Private residence Living Arrangements: Alone Available Help at Discharge: Family;Available PRN/intermittently Type of Home: Apartment Home Access: Level entry       Home Layout: One level Home Equipment: Conservation officer, nature (2 wheels);Shower seat      Prior Function Prior Level of Function : Independent/Modified Independent;Driving                     Hand Dominance   Dominant Hand: Right    Extremity/Trunk Assessment   Upper Extremity Assessment Upper Extremity Assessment: Overall WFL for  tasks assessed    Lower Extremity Assessment Lower Extremity Assessment: Overall WFL for tasks assessed    Cervical / Trunk Assessment Cervical / Trunk Assessment: Kyphotic  Communication   Communication: No difficulties  Cognition Arousal/Alertness: Awake/alert Behavior During Therapy: WFL for tasks assessed/performed Overall Cognitive Status: Within Functional Limits for tasks assessed                                          General Comments      Exercises     Assessment/Plan    PT Assessment Patient does not need any further PT services  PT Problem List         PT Treatment  Interventions      PT Goals (Current goals can be found in the Care Plan section)  Acute Rehab PT Goals PT Goal Formulation: All assessment and education complete, DC therapy    Frequency       Co-evaluation               AM-PAC PT "6 Clicks" Mobility  Outcome Measure Help needed turning from your back to your side while in a flat bed without using bedrails?: A Little Help needed moving from lying on your back to sitting on the side of a flat bed without using bedrails?: A Little Help needed moving to and from a bed to a chair (including a wheelchair)?: A Little Help needed standing up from a chair using your arms (e.g., wheelchair or bedside chair)?: A Little Help needed to walk in hospital room?: A Little Help needed climbing 3-5 steps with a railing? : A Little 6 Click Score: 18    End of Session Equipment Utilized During Treatment: Gait belt Activity Tolerance: Patient tolerated treatment well Patient left: in chair;with call bell/phone within reach;with chair alarm set Nurse Communication: Mobility status PT Visit Diagnosis: Difficulty in walking, not elsewhere classified (R26.2)    Time: 7673-4193 PT Time Calculation (min) (ACUTE ONLY): 14 min   Charges:   PT Evaluation $PT Eval Low Complexity: 1 Low        Kati PT, DPT Physical Therapist Acute Rehabilitation Services Preferred contact method: Secure Chat Weekend Pager Only: [phone] Office: [phone]   Sue Chase [place] 04/10/2022, 2:01 PM

## 2022-04-10 NOTE — Plan of Care (Signed)
  Problem: Activity: Goal: Risk for activity intolerance will decrease Outcome: Adequate for Discharge   Problem: Nutrition: Goal: Adequate nutrition will be maintained Outcome: Adequate for Discharge   Problem: Elimination: Goal: Will not experience complications related to bowel motility Outcome: Adequate for Discharge   Problem: Education: Goal: Knowledge of General Education information will improve Description: Including pain rating scale, medication(s)/side effects and non-pharmacologic comfort measures Outcome: Adequate for Discharge

## 2022-04-11 ENCOUNTER — Telehealth: Payer: Self-pay | Admitting: Medical Oncology

## 2022-04-11 LAB — BPAM RBC
Blood Product Expiration Date: 202403102359
ISSUE DATE / TIME: 202402180520
Unit Type and Rh: 6200

## 2022-04-11 LAB — TYPE AND SCREEN
ABO/RH(D): A POS
Antibody Screen: NEGATIVE
Unit division: 0

## 2022-04-11 NOTE — Telephone Encounter (Signed)
Does she need labs tomorrow?  Labs done yesterday in hospital where she received blood and IVF.     2.Do you need to see her before 02/29?

## 2022-04-12 ENCOUNTER — Other Ambulatory Visit: Payer: Self-pay

## 2022-04-12 ENCOUNTER — Telehealth: Payer: Self-pay | Admitting: Medical Oncology

## 2022-04-12 DIAGNOSIS — R197 Diarrhea, unspecified: Secondary | ICD-10-CM

## 2022-04-12 NOTE — Telephone Encounter (Addendum)
"   I can't live like this. I cannot do anything . I am weak, have trouble getting off the toilet w  diarrhea off and on". Imodium AD helps . She is scared to drive.  . She is eating and drinking fluids.   Pt notified of lab and [facility] appt tomorrow. I instructed pt to call 911  if her symptoms worsen ( N/V/D, falling ).

## 2022-04-12 NOTE — Progress Notes (Signed)
Orders entered for [facility] visit. 

## 2022-04-12 NOTE — Addendum Note (Signed)
Addended by: Derrick Ravel on: 04/12/2022 04:34 PM   Modules accepted: Orders

## 2022-04-13 ENCOUNTER — Inpatient Hospital Stay: Payer: Medicare Other

## 2022-04-13 ENCOUNTER — Inpatient Hospital Stay (HOSPITAL_BASED_OUTPATIENT_CLINIC_OR_DEPARTMENT_OTHER): Payer: Medicare Other | Admitting: Physician Assistant

## 2022-04-13 VITALS — BP 112/60 | HR 88 | Temp 97.9°F | Resp 16 | Ht 62.0 in | Wt 96.0 lb

## 2022-04-13 DIAGNOSIS — R197 Diarrhea, unspecified: Secondary | ICD-10-CM

## 2022-04-13 DIAGNOSIS — Z95828 Presence of other vascular implants and grafts: Secondary | ICD-10-CM

## 2022-04-13 DIAGNOSIS — C3411 Malignant neoplasm of upper lobe, right bronchus or lung: Secondary | ICD-10-CM | POA: Diagnosis not present

## 2022-04-13 DIAGNOSIS — R531 Weakness: Secondary | ICD-10-CM

## 2022-04-13 DIAGNOSIS — C3491 Malignant neoplasm of unspecified part of right bronchus or lung: Secondary | ICD-10-CM

## 2022-04-13 DIAGNOSIS — E876 Hypokalemia: Secondary | ICD-10-CM

## 2022-04-13 LAB — CMP (CANCER CENTER ONLY)
ALT: 48 U/L — ABNORMAL HIGH (ref 0–44)
AST: 103 U/L — ABNORMAL HIGH (ref 15–41)
Albumin: 2.8 g/dL — ABNORMAL LOW (ref 3.5–5.0)
Alkaline Phosphatase: 313 U/L — ABNORMAL HIGH (ref 38–126)
Anion gap: 7 (ref 5–15)
BUN: 17 mg/dL (ref 8–23)
CO2: 27 mmol/L (ref 22–32)
Calcium: 7.5 mg/dL — ABNORMAL LOW (ref 8.9–10.3)
Chloride: 101 mmol/L (ref 98–111)
Creatinine: 1.22 mg/dL — ABNORMAL HIGH (ref 0.44–1.00)
GFR, Estimated: 49 mL/min — ABNORMAL LOW (ref >60)
Glucose, Bld: 96 mg/dL (ref 70–99)
Potassium: 3.9 mmol/L (ref 3.5–5.1)
Sodium: 135 mmol/L (ref 135–145)
Total Bilirubin: 0.7 mg/dL (ref 0.3–1.2)
Total Protein: 5.5 g/dL — ABNORMAL LOW (ref 6.5–8.1)

## 2022-04-13 LAB — MAGNESIUM: Magnesium: 1.3 mg/dL — ABNORMAL LOW (ref 1.7–2.4)

## 2022-04-13 LAB — SAMPLE TO BLOOD BANK

## 2022-04-13 LAB — CBC WITH DIFFERENTIAL (CANCER CENTER ONLY)
Abs Immature Granulocytes: 0.11 10*3/uL — ABNORMAL HIGH (ref 0.00–0.07)
Basophils Absolute: 0 10*3/uL (ref 0.0–0.1)
Basophils Relative: 1 %
Eosinophils Absolute: 0.1 10*3/uL (ref 0.0–0.5)
Eosinophils Relative: 1 %
HCT: 26.9 % — ABNORMAL LOW (ref 36.0–46.0)
Hemoglobin: 9.2 g/dL — ABNORMAL LOW (ref 12.0–15.0)
Immature Granulocytes: 2 %
Lymphocytes Relative: 19 %
Lymphs Abs: 1.3 10*3/uL (ref 0.7–4.0)
MCH: 31.2 pg (ref 26.0–34.0)
MCHC: 34.2 g/dL (ref 30.0–36.0)
MCV: 91.2 fL (ref 80.0–100.0)
Monocytes Absolute: 1.1 10*3/uL — ABNORMAL HIGH (ref 0.1–1.0)
Monocytes Relative: 16 %
Neutro Abs: 4.2 10*3/uL (ref 1.7–7.7)
Neutrophils Relative %: 61 %
Platelet Count: 158 10*3/uL (ref 150–400)
RBC: 2.95 MIL/uL — ABNORMAL LOW (ref 3.87–5.11)
RDW: 17.1 % — ABNORMAL HIGH (ref 11.5–15.5)
WBC Count: 6.7 10*3/uL (ref 4.0–10.5)
nRBC: 0 % (ref 0.0–0.2)

## 2022-04-13 MED ORDER — HEPARIN SOD (PORK) LOCK FLUSH 100 UNIT/ML IV SOLN
500.0000 [IU] | Freq: Once | INTRAVENOUS | Status: AC
  Administered 2022-04-13: 500 [IU]

## 2022-04-13 MED ORDER — SODIUM CHLORIDE 0.9% FLUSH
10.0000 mL | Freq: Once | INTRAVENOUS | Status: AC
  Administered 2022-04-13: 10 mL

## 2022-04-13 MED ORDER — MAGNESIUM SULFATE 2 GM/50ML IV SOLN
2.0000 g | Freq: Once | INTRAVENOUS | Status: AC
  Administered 2022-04-13: 2 g via INTRAVENOUS
  Filled 2022-04-13: qty 50

## 2022-04-13 MED ORDER — SODIUM CHLORIDE 0.9 % IV SOLN: Freq: Once | INTRAVENOUS | Status: AC

## 2022-04-13 NOTE — Patient Instructions (Signed)
Dehydration, Adult Dehydration is condition in which there is not enough water or other fluids in the body. This happens when a person loses more fluids than he or she takes in. Important body parts cannot work right without the right amount of fluids. Any loss of fluids from the body can cause dehydration. Dehydration can be mild, worse, or very bad. It should be treated right away to keep it from getting very bad. What are the causes? This condition may be caused by: Conditions that cause loss of water or other fluids, such as: Watery poop (diarrhea). Vomiting. Sweating a lot. Peeing (urinating) a lot. Not drinking enough fluids, especially when you: Are ill. Are doing things that take a lot of energy to do. Other illnesses and conditions, such as fever or infection. Certain medicines, such as medicines that take extra fluid out of the body (diuretics). Lack of safe drinking water. Not being able to get enough water and food. What increases the risk? The following factors may make you more likely to develop this condition: Having a long-term (chronic) illness that has not been treated the right way, such as: Diabetes. Heart disease. Kidney disease. Being 65 years of age or older. Having a disability. Living in a place that is high above the ground or sea (high in altitude). The thinner, dried air causes more fluid loss. Doing exercises that put stress on your body for a long time. What are the signs or symptoms? Symptoms of dehydration depend on how bad it is. Mild or worse dehydration Thirst. Dry lips or dry mouth. Feeling dizzy or light-headed, especially when you stand up from sitting. Muscle cramps. Your body making: Dark pee (urine). Pee may be the color of tea. Less pee than normal. Less tears than normal. Headache. Very bad dehydration Changes in skin. Skin may: Be cold to the touch (clammy). Be blotchy or pale. Not go back to normal right after you lightly pinch  it and let it go. Little or no tears, pee, or sweat. Changes in vital signs, such as: Fast breathing. Low blood pressure. Weak pulse. Pulse that is more than 100 beats a minute when you are sitting still. Other changes, such as: Feeling very thirsty. Eyes that look hollow (sunken). Cold hands and feet. Being mixed up (confused). Being very tired (lethargic) or having trouble waking from sleep. Short-term weight loss. Loss of consciousness. How is this treated? Treatment for this condition depends on how bad it is. Treatment should start right away. Do not wait until your condition gets very bad. Very bad dehydration is an emergency. You will need to go to a hospital. Mild or worse dehydration can be treated at home. You may be asked to: Drink more fluids. Drink an oral rehydration solution (ORS). This drink helps get the right amounts of fluids and salts and minerals in the blood (electrolytes). Very bad dehydration can be treated: With fluids through an IV tube. By getting normal levels of salts and minerals in your blood. This is often done by giving salts and minerals through a tube. The tube is passed through your nose and into your stomach. By treating the root cause. Follow these instructions at home: Oral rehydration solution If told by your doctor, drink an ORS: Make an ORS. Use instructions on the package. Start by drinking small amounts, about  cup (120 mL) every 5-10 minutes. Slowly drink more until you have had the amount that your doctor said to have. Eating and drinking          Drink enough clear fluid to keep your pee pale yellow. If you were told to drink an ORS, finish the ORS first. Then, start slowly drinking other clear fluids. Drink fluids such as: Water. Do not drink only water. Doing that can make the salt (sodium) level in your body get too low. Water from ice chips you suck on. Fruit juice that you have added water to (diluted). Low-calorie sports  drinks. Eat foods that have the right amounts of salts and minerals, such as: Bananas. Oranges. Potatoes. Tomatoes. Spinach. Do not drink alcohol. Avoid: Drinks that have a lot of sugar. These include: High-calorie sports drinks. Fruit juice that you did not add water to. Soda. Caffeine. Foods that are greasy or have a lot of fat or sugar. General instructions Take over-the-counter and prescription medicines only as told by your doctor. Do not take salt tablets. Doing that can make the salt level in your body get too high. Return to your normal activities as told by your doctor. Ask your doctor what activities are safe for you. Keep all follow-up visits as told by your doctor. This is important. Contact a doctor if: You have pain in your belly (abdomen) and the pain: Gets worse. Stays in one place. You have a rash. You have a stiff neck. You get angry or annoyed (irritable) more easily than normal. You are more tired or have a harder time waking than normal. You feel: Weak or dizzy. Very thirsty. Get help right away if you have: Any symptoms of very bad dehydration. Symptoms of vomiting, such as: You cannot eat or drink without vomiting. Your vomiting gets worse or does not go away. Your vomit has blood or green stuff in it. Symptoms that get worse with treatment. A fever. A very bad headache. Problems with peeing or pooping (having a bowel movement), such as: Watery poop that gets worse or does not go away. Blood in your poop (stool). This may cause poop to look black and tarry. Not peeing in 6-8 hours. Peeing only a small amount of very dark pee in 6-8 hours. Trouble breathing. These symptoms may be an emergency. Do not wait to see if the symptoms will go away. Get medical help right away. Call your local emergency services (911 in the [place] Do not drive yourself to the hospital. Summary Dehydration is a condition in which there is not enough water or other fluids  in the body. This happens when a person loses more fluids than he or she takes in. Treatment for this condition depends on how bad it is. Treatment should be started right away. Do not wait until your condition gets very bad. Drink enough clear fluid to keep your pee pale yellow. If you were told to drink an oral rehydration solution (ORS), finish the ORS first. Then, start slowly drinking other clear fluids. Take over-the-counter and prescription medicines only as told by your doctor. Get help right away if you have any symptoms of very bad dehydration. This information is not intended to replace advice given to you by your health care provider. Make sure you discuss any questions you have with your health care provider. Document Revised: 06/16/2021 Document Reviewed: 09/20/2018 [employer] Patient Education  2023 [employer]  

## 2022-04-13 NOTE — Progress Notes (Signed)
Symptom Management Consult note [place]    Patient Care Team: [name], NP as PCP - General (Family Medicine)    Name / MRN / DOB: Sue Chase  [id number]  08-20-1955   Date of visit: 04/13/2022   Chief Complaint/Reason for visit: weakness   Current Therapy: adenocarcinoma of right lung stage IV  Last treatment:  PO Kazrati    ASSESSMENT & PLAN: Patient is a 67 y.o. female  with oncologic history of adenocarcinoma of right lung stage IV followed by Dr. Julien Nordmann.  I have viewed most recent oncology note and lab work.    #Adenocarcinoma of right lung stage IV - Next appointment with oncologist is 04/21/22 - Dose was reduced to 400 mg BID on 04/05/22.  - CMP showing liver enzymes are trending down -Patient unsure if she wants to continue [place] as it is greatly decreasing her quality of life. Engaged in shared decision making with patient on discontinuing Alfred Levins now vs attempting to maximize symptom management at home. After lengthy discussion patient will continue [place]. -She plans to see discuss this further at upcoming oncology appointment. -I offered emotional support via chaplain or social work referral which patietn politely declined at this time.  #Hypocalcemia -CMP with calcium 7.5 and albumin 2.8. Corrected calcium is 8.5 -As hypocalcemia is mild, recommend patient start OTC calcium supplements.  #Hypomagnesemia -Mag today is 1.3. - Patient given 2g IV mag in clinic  #Generalized weakness -CBC without leukocytosis, hemoglobin stable at 9.2 - Patient has no focal weakness, normal neuro exam - Patient received 1L IVF in clinic for hydration support. - Patient encouraged to start Megace to see if that helps with appetite stimulation. Discussed importance of nutrition for strength and energy. -Chart review shows weight today is down 2 pounds in 3 days.  Strict ED precautions discussed should symptoms worsen.   Heme/Onc  History: Oncology History  Adenocarcinoma of right lung, stage 4 ([place])  05/2021 Initial Diagnosis   Stage IV (T2a, N0, M1a) non-small cell lung cancer, adenocarcinoma presented with multifocal bilateral pulmonary nodules involving the right upper lobe as well as the smaller bilateral groundglass opacities diagnosed in April 2023.  PD-L1 expression is 4%.  Molecular studies by [employer] 360 tissue test showed positive KRAS G12C mutation but the blood test failed secondary to insufficient circulating tumor DNA.   07/06/2021 Cancer Staging   Staging form: Lung, AJCC 8th Edition - Clinical: Stage IVA (cT2a, cN0, cM1a) - Signed by Curt Bears, MD on 07/06/2021   08/10/2021 - 02/15/2022 Chemotherapy   Patient is on Treatment Plan : LUNG Carboplatin (5) + Pemetrexed (500) + Pembrolizumab (200) D1 q21d Induction x 4 cycles / Maintenance Pemetrexed (500) + Pembrolizumab (200) D1 q21d         Interval history-: Sue Chase is a 67 y.o. female with oncologic history as above presenting to [facility] today with chief complaint of generalized weakness x 2 weeks.  She is accompanied by her daughter who provides additional history.  Patient states her weakness is progressively worsening.  She has little strength to do anything such as clean her kitchen. She feel weak while standing, as in her legs cannot support her. She has not had any falls. Patient admits over the last week she has had poor appetite. She has been a light eater, however now barely has a full meal. She tries to drink 40 oz of fluid daily. She notes her urine has been dark in color lately. She  denies any dysuria or concern for UTI. She has not had any fevers. Patient states her bowel movements are mostly normal, if she has diarrhea then she'll take Imodium which resolves it. Patient voices frustration for not being able to go out and do things like grocery shop because of her weakness. She was recently prescribed Megace although has not yet  started taking it because she felt too weak. She adds that her nausea is well controlled lately with zofran. She denies chills, chest pain, shortness of breath.      ROS  All other systems are reviewed and are negative for acute change except as noted in the HPI.    Allergies  Allergen Reactions   Codeine Itching     Past Medical History:  Diagnosis Date   Colon polyps    Complication of anesthesia    tolerated propofol but other meds made her emotional / cry/ difficulty breathing/ panic   Hyperlipidemia    Rheumatic fever    Seasonal allergies    UTI (urinary tract infection)      Past Surgical History:  Procedure Laterality Date   BRONCHIAL BIOPSY  06/08/2021   Procedure: BRONCHIAL BIOPSIES;  Surgeon: Garner Nash, DO;  Location: [facility];  Service: Pulmonary;;   BRONCHIAL BRUSHINGS  06/08/2021   Procedure: BRONCHIAL BRUSHINGS;  Surgeon: Garner Nash, DO;  Location: [facility];  Service: Pulmonary;;   BRONCHIAL NEEDLE ASPIRATION BIOPSY  06/08/2021   Procedure: BRONCHIAL NEEDLE ASPIRATION BIOPSIES;  Surgeon: Garner Nash, DO;  Location: [facility];  Service: Pulmonary;;   COLONOSCOPY  2018   ERCP N/A 11/05/2021   Procedure: ENDOSCOPIC RETROGRADE CHOLANGIOPANCREATOGRAPHY (ERCP);  Surgeon: Irene Shipper, MD;  Location: [facility];  Service: Gastroenterology;  Laterality: N/A;   IR IMAGING GUIDED PORT INSERTION  08/12/2021   REMOVAL OF STONES  11/05/2021   Procedure: REMOVAL OF STONES;  Surgeon: Irene Shipper, MD;  Location: [facility];  Service: Gastroenterology;;   SALIVARY GLAND SURGERY     LATE 80S   SPHINCTEROTOMY  11/05/2021   Procedure: SPHINCTEROTOMY;  Surgeon: Irene Shipper, MD;  Location: [facility];  Service: Gastroenterology;;   TUBAL LIGATION  1986   VIDEO BRONCHOSCOPY WITH RADIAL ENDOBRONCHIAL ULTRASOUND  06/08/2021   Procedure: VIDEO BRONCHOSCOPY WITH RADIAL ENDOBRONCHIAL ULTRASOUND;  Surgeon: Garner Nash, DO;  Location: [facility];  Service: Pulmonary;;    Social History   Socioeconomic History   Marital status: Widowed    Spouse name: Not on file   Number of children: Not on file   Years of education: Not on file   Highest education level: Some college, no degree  Occupational History   Not on file  Tobacco Use   Smoking status: Some Days    Packs/day: 1.00    Years: 40.00    Total pack years: 40.00    Types: Cigarettes    Passive exposure: Never   Smokeless tobacco: Never   Tobacco comments:    1/2 ppd or less 06/15/21. Hsm/.   Vaping Use   Vaping Use: Never used  Substance and Sexual Activity   Alcohol use: Yes    Comment: Rum and Coke/Unsure of amount   Drug use: Never   Sexual activity: Not on file  Other Topics Concern   Not on file  Social History Narrative   Married    Two grown children    She enjoys reading, walking   Social Determinants of Health   Financial Resource Strain: Medium  Risk (10/18/2021)   Overall Financial Resource Strain (CARDIA)    Difficulty of Paying Living Expenses: Somewhat hard  Food Insecurity: No Food Insecurity (04/10/2022)   Hunger Vital Sign    Worried About Running Out of Food in the Last Year: Never true    Ran Out of Food in the Last Year: Never true  Transportation Needs: No Transportation Needs (04/10/2022)   PRAPARE - Hydrologist (Medical): No    Lack of Transportation (Non-Medical): No  Physical Activity: Unknown (10/18/2021)   Exercise Vital Sign    Days of Exercise per Week: Patient refused    Minutes of Exercise per Session: Not on file  Stress: No Stress Concern Present (10/18/2021)   [place]    Feeling of Stress : Only a little  Social Connections: Unknown (10/18/2021)   Social Connection and Isolation Panel [NHANES]    Frequency of Communication with Friends and Family: Three times a week    Frequency of Social Gatherings with Friends  and Family: Patient refused    Attends Religious Services: Patient refused    Active Member of Clubs or Organizations: No    Attends Music therapist: Not on file    Marital Status: Widowed  Intimate Partner Violence: Not At Risk (04/10/2022)   Humiliation, Afraid, Rape, and Kick questionnaire    Fear of Current or Ex-Partner: No    Emotionally Abused: No    Physically Abused: No    Sexually Abused: No    Family History  Problem Relation Age of Onset   Arthritis Mother    Diabetes Mother    Heart disease Mother    Hyperlipidemia Mother    Hypertension Mother    Miscarriages / [place] Mother    Hearing loss Father    Liver cancer Father    Lung cancer Father    Hyperlipidemia Father    Diabetes Brother    Lung disease Maternal Grandfather        Black Lung   Diabetes Paternal Grandmother    Cancer Paternal Grandfather    Diabetes Paternal Grandfather      Current Outpatient Medications:    acetaminophen (TYLENOL) 500 MG tablet, Take 500 mg by mouth every 6 (six) hours as needed for moderate pain., Disp: , Rfl:    adagrasib (KRAZATI) 200 MG tablet, Take 3 tablets (600 mg total) by mouth 2 (two) times daily., Disp: 180 tablet, Rfl: 3   albuterol (VENTOLIN HFA) 108 (90 Base) MCG/ACT inhaler, Inhale 2 puffs into the lungs every 6 (six) hours as needed for wheezing or shortness of breath., Disp: 8 g, Rfl: 5   apixaban (ELIQUIS) 2.5 MG TABS tablet, Take 1 tablet (2.5 mg total) by mouth 2 (two) times daily. Start this treatment once you receive the targeted therapy with Alfred Levins (Adagrasib).  Continue the other Eliquis 5 mg twice daily, Disp: 60 tablet, Rfl: 2   Budeson-Glycopyrrol-Formoterol (BREZTRI AEROSPHERE) 160-9-4.8 MCG/ACT AERO, Inhale 2 puffs into the lungs in the morning and at bedtime., Disp: 10.7 g, Rfl: 5   FeFum-FePoly-FA-B Cmp-C-Biot (INTEGRA PLUS) CAPS, Take 1 capsule by mouth every morning. (Patient taking differently: Take 1 capsule by mouth 2 (two)  times a week.), Disp: 30 capsule, Rfl: 2   lidocaine-prilocaine (EMLA) cream, Apply to the Port-A-Cath site 30 minutes before chemotherapy (Patient taking differently: Apply 1 Application topically daily as needed. Apply to the Port-A-Cath site 30 minutes before chemotherapy), Disp: 30 g,  Rfl: 0   magnesium oxide (MAG-OX) 400 (240 Mg) MG tablet, Take 1 tablet (400 mg total) by mouth daily., Disp: 30 tablet, Rfl: 0   megestrol (MEGACE) 20 MG tablet, Take 1 tablet (20 mg total) by mouth daily., Disp: 30 tablet, Rfl: 2   omeprazole (PRILOSEC) 20 MG capsule, Take 1 capsule (20 mg total) by mouth daily., Disp: 30 capsule, Rfl: 3   ondansetron (ZOFRAN-ODT) 4 MG disintegrating tablet, Dissolve 1 tablet (4 mg total) by mouth every 8 (eight) hours as needed for nausea or vomiting., Disp: 20 tablet, Rfl: 0   potassium chloride SA (KLOR-CON M) 20 MEQ tablet, Take 20 mEq by mouth daily., Disp: , Rfl:    prochlorperazine (COMPAZINE) 10 MG tablet, Take 1 tablet (10 mg total) by mouth every 6 (six) hours as needed for nausea or vomiting., Disp: 30 tablet, Rfl: 0  PHYSICAL EXAM: ECOG FS:3 - Symptomatic, >50% confined to bed  T: 97.9   BP: 108/68   HR: 91   Resp: 16   O2: 98%   Wt: 96 lbs Physical Exam Vitals and nursing note reviewed.  Constitutional:      Appearance: She is not ill-appearing or toxic-appearing.     Comments: Thin appearing female  HENT:     Head: Normocephalic.     Nose: Nose normal.     Mouth/Throat:     Mouth: Mucous membranes are dry.  Eyes:     Conjunctiva/sclera: Conjunctivae normal.  Neck:     Vascular: No JVD.  Cardiovascular:     Rate and Rhythm: Normal rate and regular rhythm.     Pulses: Normal pulses.     Heart sounds: Normal heart sounds.  Pulmonary:     Effort: Pulmonary effort is normal.     Breath sounds: Normal breath sounds.  Abdominal:     General: There is no distension.     Palpations: There is no mass.     Tenderness: There is no abdominal tenderness. There  is no guarding or rebound.     Hernia: No hernia is present.  Musculoskeletal:     Cervical back: Normal range of motion.  Skin:    General: Skin is warm and dry.  Neurological:     Mental Status: She is oriented to person, place, and time.     Comments: Speech is clear and goal oriented, follows commands Normal strength in upper and lower extremities bilaterally including dorsiflexion and plantar flexion, strong and equal grip strength Sensation normal to light and sharp touch Moves extremities without ataxia, coordination intact Normal gait and balance          LABORATORY DATA: I have reviewed the data as listed    Latest Ref Rng & Units 04/13/2022   11:24 AM 04/10/2022   12:52 PM 04/10/2022   10:23 AM  CBC  WBC 4.0 - 10.5 K/uL 6.7  5.4  5.4   Hemoglobin 12.0 - 15.0 g/dL 9.2  8.6  7.7   Hematocrit 36.0 - 46.0 % 26.9  25.6  22.9   Platelets 150 - 400 K/uL 158  143  150         Latest Ref Rng & Units 04/13/2022   11:24 AM 04/10/2022    1:02 PM 04/10/2022    3:40 AM  CMP  Glucose 70 - 99 mg/dL 96  87  98   BUN 8 - 23 mg/dL 17  18  21    Creatinine 0.44 - 1.00 mg/dL 1.22  1.34  1.35  Sodium 135 - 145 mmol/L 135  135  129   Potassium 3.5 - 5.1 mmol/L 3.9  3.2  3.1   Chloride 98 - 111 mmol/L 101  102  102   CO2 22 - 32 mmol/L 27  24  22    Calcium 8.9 - 10.3 mg/dL 7.5  7.2  7.2   Total Protein 6.5 - 8.1 g/dL 5.5  4.8  4.8   Total Bilirubin 0.3 - 1.2 mg/dL 0.7  0.9  0.9   Alkaline Phos 38 - 126 U/L 313  243  248   AST 15 - 41 U/L 103  133  132   ALT 0 - 44 U/L 48  60  60        RADIOGRAPHIC STUDIES (from last 24 hours if applicable) I have personally reviewed the radiological images as listed and agreed with the findings in the report. No results found.      Visit Diagnosis: 1. Hypomagnesemia   2. Adenocarcinoma of right lung, stage 4 ([place])   3. Hypocalcemia   4. Generalized weakness      No orders of the defined types were placed in this  encounter.   All questions were answered. The patient knows to call the clinic with any problems, questions or concerns. No barriers to learning was detected.  I have spent a total of 20 minutes minutes of face-to-face and non-face-to-face time, preparing to see the patient, obtaining and/or reviewing separately obtained history, performing a medically appropriate examination, counseling and educating the patient, ordering tests, documenting clinical information in the electronic health record, and care coordination (communications with other health care professionals or caregivers).    Thank you for allowing me to participate in the care of this patient.    Barrie Folk, PA-C Department of Hematology/Oncology [facility] at [facility] Phone: [phone]  [phone]    04/13/2022 3:05 PM

## 2022-04-14 ENCOUNTER — Telehealth: Payer: Self-pay | Admitting: Medical Oncology

## 2022-04-14 ENCOUNTER — Other Ambulatory Visit: Payer: Self-pay | Admitting: Physician Assistant

## 2022-04-14 DIAGNOSIS — C3491 Malignant neoplasm of unspecified part of right bronchus or lung: Secondary | ICD-10-CM

## 2022-04-14 MED ORDER — METHYLPREDNISOLONE 4 MG PO TBPK: ORAL_TABLET | ORAL | 0 refills | Status: DC

## 2022-04-14 NOTE — Telephone Encounter (Signed)
Pt called  " I am not feeling any better" She is asking for IVF tomorrow.  Per Cassie I told Zaylin , that she likely won't feel better overnight since she didn't really start anyones recommendations, until yesterday . Stop taking Krazati over the weekend and then call us Monday to let us know how she feels. I instructed her  eat and hydrate and to to start the medrol dosepak tomorrow .   She voiced understanding.

## 2022-04-18 ENCOUNTER — Telehealth: Payer: Self-pay | Admitting: Medical Oncology

## 2022-04-18 NOTE — Telephone Encounter (Addendum)
Sue Chase- held since Thursday afternoon due to extreme fatigue. However , now she says she can get around better and stand longer and drive her care. Should she restart it?   Chest pressure-for 1-2 weeks. She describes it like an elastic band that squeezes her chest below her breasts. Laying down it feels better , seems like it is worse after she eats.Sometimes it feels tighter than other times. It does not feel like a gallstone issue.  Swelling- feet and ankle , has to wear slippers and her skin is flaky.  Appt Thursday.  I told [name] if her symptoms worsen w/ sob, chest pain , dizzy , lightheaded to call EMS.

## 2022-04-19 ENCOUNTER — Ambulatory Visit: Payer: Medicare Other | Admitting: Physician Assistant

## 2022-04-19 ENCOUNTER — Ambulatory Visit: Payer: Medicare Other

## 2022-04-19 ENCOUNTER — Other Ambulatory Visit: Payer: Medicare Other

## 2022-04-19 NOTE — Progress Notes (Signed)
[facility] OFFICE PROGRESS NOTE  [name], NP [address] [place] [address]  DIAGNOSIS: Stage IV (T2a, N0, M1a) non-small cell lung cancer, adenocarcinoma presented with multifocal bilateral pulmonary nodules involving the right upper lobe as well as the smaller bilateral groundglass opacities diagnosed in April 2023.  PD-L1 expression is 4%.   Molecular studies by [employer] 360 tissue test showed positive KRAS G12C mutation but the blood test failed secondary to insufficient circulating tumor DNA.  PRIOR THERAPY: Systemic chemotherapy with carboplatin for AUC of 5, Alimta 500 Mg/M2 and Keytruda 200 Mg IV every 3 weeks.  First dose 08/10/2021.  Status post 8 cycles.  Starting from cycle #5 the patient is on maintenance treatment with Alimta and Keytruda every 3 weeks.  This treatment was discontinued secondary to disease progression    CURRENT THERAPY:  Krazati (Adagrasib) 600 mg p.o. twice daily.  First dose started 03/08/2022. Status post about 1 month of treatment. Will reduce dose to 400 mg BID starting from today 04/05/22 due to intolerance. Treatment currently on hold starting from 04/15/22 due to intolerance.   INTERVAL HISTORY: Sue Chase 67 y.o. female returns to the clinic today for a follow-up visit accompanied by her daughter. She was last seen by myself and Dr. Arbutus Ped on 04/05/2022.  The patient was recently found to have added evidence of disease progression in January 2024.  Therefore, she was started on targeted treatment with [place].  She tolerated the first 2 weeks well but then she developed significant weakness, nausea, vomiting, and decreased quality of life.  She has been seen in the clinic on 2/13 and her dose of Krazati was reduced to 400 mg twice daily.  She is also been having some electrolyte derangements.  She has received IV fluids in the clinic.  She had called the clinic on 04/14/2022 endorsing that she did not feel any better.   Therefore, we instructed her to hold her [place]. Since holding her Caryn Section, she is feeling about the same.  Her appetite has improved but she still is having weakness.  She has 3 main concerns today related to a bandlike sensation around her diaphragm, bilateral lower extremity pitting edema, and numbness.  Regarding the bandlike sensation around her diaphragm, this has been going on for approximately 2 weeks and is constant.  She was seen in the emergency room and they performed an ultrasound of the right upper quadrant which showed a contracted gallbladder.  She has some soreness that is worse in the right upper quadrant.  She has a compression fracture that is stable at T9.  She denies any worsening in her cough or baseline dyspnea on exertion.  With regard to the numbness, it is in both of her hands bilaterally.  Her calcium was found to be very low last week.  She was instructed to start taking a calcium supplement which she takes when she is able.  She sometimes has constipation and has not been able to take these daily.  She denies any cervical spine issues.   Regarding the lower extremity swelling, the edema is pitting.  She states that she was given Lasix pills in the past but they made her feel dizzy.  She does not have a blood pressure cuff at home but her and her daughter are going to work on getting 1.  She had an echocardiogram performed last month which showed a normal ejection fraction at 60 to 65%.  They are going to get compression  stockings but do not have them at this time.  She has been struggling with hypoalbuminemia.  He does not like the taste of the commercially store-bought protein supplemental drinks.  She denies any fever, chills, night sweats.  She takes her magnesium supplement when she is able but she has not been able to take this daily.  She states that she take half a tablet at a time.  She only took Megace 1 day because she did not feel like she needed it.  Since she was  on steroids last week, that helped her appetite.  She denies any significant nausea or vomiting except for 1 day.  She reports that she does not have any significant diarrhea and will take Imodium if needed.  She is here today for evaluation repeat blood work and for more detailed discussion about next steps.   MEDICAL HISTORY: Past Medical History:  Diagnosis Date   Colon polyps    Complication of anesthesia    tolerated propofol but other meds made her emotional / cry/ difficulty breathing/ panic   Hyperlipidemia    Rheumatic fever    Seasonal allergies    UTI (urinary tract infection)     ALLERGIES:  is allergic to codeine.  MEDICATIONS:  Current Outpatient Medications  Medication Sig Dispense Refill   acetaminophen (TYLENOL) 500 MG tablet Take 500 mg by mouth every 6 (six) hours as needed for moderate pain.     adagrasib (KRAZATI) 200 MG tablet Take 3 tablets (600 mg total) by mouth 2 (two) times daily. 180 tablet 3   albuterol (VENTOLIN HFA) 108 (90 Base) MCG/ACT inhaler Inhale 2 puffs into the lungs every 6 (six) hours as needed for wheezing or shortness of breath. 8 g 5   apixaban (ELIQUIS) 2.5 MG TABS tablet Take 1 tablet (2.5 mg total) by mouth 2 (two) times daily. Start this treatment once you receive the targeted therapy with Caryn Section (Adagrasib).  Continue the other Eliquis 5 mg twice daily 60 tablet 2   Budeson-Glycopyrrol-Formoterol (BREZTRI AEROSPHERE) 160-9-4.8 MCG/ACT AERO Inhale 2 puffs into the lungs in the morning and at bedtime. 10.7 g 5   FeFum-FePoly-FA-B Cmp-C-Biot (INTEGRA PLUS) CAPS Take 1 capsule by mouth every morning. (Patient taking differently: Take 1 capsule by mouth 2 (two) times a week.) 30 capsule 2   lidocaine-prilocaine (EMLA) cream Apply to the Port-A-Cath site 30 minutes before chemotherapy (Patient taking differently: Apply 1 Application topically daily as needed. Apply to the Port-A-Cath site 30 minutes before chemotherapy) 30 g 0   magnesium oxide  (MAG-OX) 400 (240 Mg) MG tablet Take 1 tablet (400 mg total) by mouth daily. 30 tablet 0   megestrol (MEGACE) 20 MG tablet Take 1 tablet (20 mg total) by mouth daily. 30 tablet 2   methylPREDNISolone (MEDROL DOSEPAK) 4 MG TBPK tablet Use as instructed 21 tablet 0   omeprazole (PRILOSEC) 20 MG capsule Take 1 capsule (20 mg total) by mouth daily. 30 capsule 3   ondansetron (ZOFRAN-ODT) 4 MG disintegrating tablet Dissolve 1 tablet (4 mg total) by mouth every 8 (eight) hours as needed for nausea or vomiting. 20 tablet 0   potassium chloride SA (KLOR-CON M) 20 MEQ tablet Take 20 mEq by mouth daily.     prochlorperazine (COMPAZINE) 10 MG tablet Take 1 tablet (10 mg total) by mouth every 6 (six) hours as needed for nausea or vomiting. 30 tablet 0   No current facility-administered medications for this visit.    SURGICAL HISTORY:  Past Surgical  History:  Procedure Laterality Date   BRONCHIAL BIOPSY  06/08/2021   Procedure: BRONCHIAL BIOPSIES;  Surgeon: Josephine Igo, DO;  Location: [facility];  Service: Pulmonary;;   BRONCHIAL BRUSHINGS  06/08/2021   Procedure: BRONCHIAL BRUSHINGS;  Surgeon: Josephine Igo, DO;  Location: [facility];  Service: Pulmonary;;   BRONCHIAL NEEDLE ASPIRATION BIOPSY  06/08/2021   Procedure: BRONCHIAL NEEDLE ASPIRATION BIOPSIES;  Surgeon: Josephine Igo, DO;  Location: [facility];  Service: Pulmonary;;   COLONOSCOPY  2018   ERCP N/A 11/05/2021   Procedure: ENDOSCOPIC RETROGRADE CHOLANGIOPANCREATOGRAPHY (ERCP);  Surgeon: Hilarie Fredrickson, MD;  Location: [facility];  Service: Gastroenterology;  Laterality: N/A;   IR IMAGING GUIDED PORT INSERTION  08/12/2021   REMOVAL OF STONES  11/05/2021   Procedure: REMOVAL OF STONES;  Surgeon: Hilarie Fredrickson, MD;  Location: [facility];  Service: Gastroenterology;;   SALIVARY GLAND SURGERY     LATE 80S   SPHINCTEROTOMY  11/05/2021   Procedure: SPHINCTEROTOMY;  Surgeon: Hilarie Fredrickson, MD;  Location: [facility];  Service:  Gastroenterology;;   TUBAL LIGATION  1986   VIDEO BRONCHOSCOPY WITH RADIAL ENDOBRONCHIAL ULTRASOUND  06/08/2021   Procedure: VIDEO BRONCHOSCOPY WITH RADIAL ENDOBRONCHIAL ULTRASOUND;  Surgeon: Josephine Igo, DO;  Location: [facility];  Service: Pulmonary;;    REVIEW OF SYSTEMS:   Review of Systems  Constitutional: Positive for weakness, continued poor appetite, and fatigue.  Negative for chills and fever.  HENT: Negative for mouth sores, nosebleeds, sore throat and trouble swallowing.   Eyes: Negative for eye problems and icterus.  Respiratory: Stated for baseline dyspnea exertion and cough.  Negative for  hemoptysis and wheezing.   Cardiovascular: Positive for bilateral lower extremity pitting edema.  Negative for chest pain.  Gastrointestinal: Positive for bandlike soreness in the diaphragm area worse in the right upper quadrant.  Negative for significant constipation, diarrhea, nausea and vomiting.  Genitourinary: Negative for bladder incontinence, difficulty urinating, dysuria, frequency and hematuria.   Musculoskeletal: Negative for back pain, gait problem, neck pain and neck stiffness.  Skin: Negative for itching and rash.  Neurological: Positive for numbness in the hands bilaterally.  Negative for dizziness, extremity weakness, gait problem, headaches, light-headedness and seizures.  Hematological: Negative for adenopathy. Does not bruise/bleed easily.  Psychiatric/Behavioral: Negative for confusion, depression and sleep disturbance. The patient is not nervous/anxious.     PHYSICAL EXAMINATION:  Blood pressure 133/62, pulse 77, temperature 97.6 F (36.4 C), temperature source Oral, resp. rate 15, weight 96 lb 6.4 oz (43.7 kg), SpO2 100 %.  ECOG PERFORMANCE STATUS: 1  Physical Exam  Constitutional: Oriented to person, place, and time and hectic appearing female, and in no distress. HENT:  Head: Normocephalic and atraumatic.  Mouth/Throat: Oropharynx is clear and moist. No  oropharyngeal exudate.  Eyes: Conjunctivae are normal. Right eye exhibits no discharge. Left eye exhibits no discharge. No scleral icterus.  Neck: Normal range of motion. Neck supple.  Cardiovascular: Normal rate, regular rhythm, normal heart sounds and intact distal pulses.   Pulmonary/Chest: Effort normal and breath sounds normal. No respiratory distress. No wheezes. No rales.  Abdominal: Soft. Bowel sounds are normal. Exhibits no distension and no mass.  Upper quadrant tenderness. Musculoskeletal: Normal range of motion.  Bilateral pitting edema in the lower extremities Lymphadenopathy:    No cervical adenopathy.  Neurological: Alert and oriented to person, place, and time. Exhibits normal muscle tone. Gait normal. Coordination normal.  Skin: Skin is warm and dry. No rash noted. Not diaphoretic. No erythema.  No pallor.  Psychiatric: Mood, memory and judgment normal.  Vitals reviewed.  LABORATORY DATA: Lab Results  Component Value Date   WBC 15.6 (H) 04/21/2022   HGB 9.4 (L) 04/21/2022   HCT 28.5 (L) 04/21/2022   MCV 94.1 04/21/2022   PLT 439 (H) 04/21/2022      Chemistry      Component Value Date/Time   NA 138 04/21/2022 0812   K 3.3 (L) 04/21/2022 0812   CL 105 04/21/2022 0812   CO2 26 04/21/2022 0812   BUN 19 04/21/2022 0812   CREATININE 0.89 04/21/2022 0812      Component Value Date/Time   CALCIUM 7.9 (L) 04/21/2022 0812   ALKPHOS 208 (H) 04/21/2022 0812   AST 33 04/21/2022 0812   ALT 34 04/21/2022 0812   BILITOT 0.5 04/21/2022 0812       RADIOGRAPHIC STUDIES:  US Abdomen Limited RUQ (LIVER/GB)  Result Date: 04/09/2022 CLINICAL DATA:  Elevated LFTs EXAM: ULTRASOUND ABDOMEN LIMITED RIGHT UPPER QUADRANT COMPARISON:  12/30/2021 CT, ultrasound from 11/03/2021 FINDINGS: Gallbladder: Gallbladder is decompressed accentuating the wall thickness. No pericholecystic fluid is noted. Negative sonographic Murphy's sign is elicited. No gallstones are seen. Common bile duct:  Diameter: 3 mm Liver: Increase in echogenicity likely related to fatty infiltration. No focal mass is seen. Portal vein is patent on color Doppler imaging with normal direction of blood flow towards the liver. Other: None. IMPRESSION: Partially contracted gallbladder. Fatty infiltration of the liver. Electronically Signed   By: Alcide Clever M.D.   On: 04/09/2022 21:15   DG Chest Portable 1 View  Result Date: 04/09/2022 CLINICAL DATA:  Increasing weakness and fatigue EXAM: PORTABLE CHEST 1 VIEW COMPARISON:  02/17/2022 FINDINGS: Cardiac shadow is stable. Lungs are hyperinflated but clear. Right chest wall port is noted. Previously seen right upper lobe opacity is less prominent than on the prior exam. No new focal abnormality is seen. IMPRESSION: Known right upper lobe opacity is less well visualized than on the prior exam. No new focal abnormality is noted. Electronically Signed   By: Alcide Clever M.D.   On: 04/09/2022 19:24     ASSESSMENT/PLAN:  This very pleasant 67 year old Caucasian female diagnosed with stage IV (T2 a, N0, M1 a) non-small cell lung cancer, adenocarcinoma.  The patient presented multifocal bilateral pulmonary nodules involving the right upper lobe as well as small bilateral groundglass opacities.  She was diagnosed in April 2023.  She is positive for a K-ras G12 C mutation and her PD-L1 expression of 4%.  Her K-ras G12 C mutation can be targeted in the second line setting in the future.    The patient is currently undergoing systemic chemotherapy with carboplatin for an AUC of 5, Alimta 500 mg per metered square, Keytruda 200 mg IV every 3 weeks.  She is status post 8 cycles.  She tolerates this fairly well. Starting from cycle #5, she started maintenance alimta and [place].    In early January the patient had a repeat CT scan that showed possible progression with increased size of the nodules in the posterior right upper lobe as well as increase in size of the innumerable bilateral  pulmonary nodules.   Therefore, Dr. Arbutus Ped discontinued her treatment Alimta Keytruda due to the disease progression.   She was then started on Krazati 600 mg p.o. twice daily on 03/08/2022.  She tolerated this well for the first 2 weeks but then she developed nausea, vomiting, and diarrhea for which she was seen in the symptom management  clinic last week and received IV fluids and electrolyte replacement.  She was seen on 04/05/2022 her dose of Caryn [place] was reduced to 400 mg twice daily.  She still experienced intolerance to this and was seen in the symptom management clinic and received IV fluids.  Her main case was also increased due to her poor appetite and weight loss and weakness.  Ultimately, her Caryn Section was placed on hold on 04/14/2022.  The patient was seen with Dr. Arbutus Ped today.  Dr. Arbutus Ped recommends that she continue to hold her treatment until she can have a restaging CT scan of the chest, abdomen, pelvis which we are aiming for to have performed in the next week or so.  The numbness in her hands, I will also arrange for a brain MRI to rule out metastatic disease to the brain.  Dr. Arbutus Ped is hopeful that we will be able to resume her Caryn Section in the future with continued dose reductions.  For now, she will continue to hold her [place].  We will arrange for her to come back this afternoon for IV magnesium with 4 g due to her persistently low magnesium despite receiving IV magnesium last week.  The patient is not able to tolerate her over-the-counter magnesium supplement daily.  For her lower extremity swelling, the patient has normal ejection fraction from echocardiogram last month.  The patient states that she does not tolerate Lasix well due to dizziness.  I encouraged her to purchase a blood pressure cuff at home.  For now we will try to manage her swelling conservatively with elevation and compression stockings.  The patient does not like the protein supplemental drinks at her sole  commercially and I encouraged her to make her own protein supplemental drinks.  For her low calcium, she was encouraged to continue taking her calcium supplements.  Dr. Arbutus Ped feels that her bandlike sensation around her diaphragm could be secondary to spasms of her gallbladder.  The patient's daughter was asking about a home health/assistance at home.  Discussed that typically home health agencies with nursing care to help with nursing related needs as opposed to being a personal care assistant and they may want to look into more of a personal care assistant.  We will see her back in approximately 1 week or so to review her scan results.  The patient was advised to call immediately if she has any concerning symptoms in the interval. The patient voices understanding of current disease status and treatment options and is in agreement with the current care plan. All questions were answered. The patient knows to call the clinic with any problems, questions or concerns. We can certainly see the patient much sooner if necessary     Orders Placed This Encounter  Procedures   CT Abdomen Pelvis W Contrast    Standing Status:   Future    Standing Expiration Date:   04/21/2023    Scheduling Instructions:     Please try to schedule within the week    Order Specific Question:   If indicated for the ordered procedure, I authorize the administration of contrast media per Radiology protocol    Answer:   Yes    Order Specific Question:   Does the patient have a contrast media/X-ray dye allergy?    Answer:   No    Order Specific Question:   Preferred imaging location?    Answer:   [facility]    Order Specific Question:   Is Oral Contrast requested for  this exam?    Answer:   Yes, Per Radiology protocol   CT Chest W Contrast    Standing Status:   Future    Standing Expiration Date:   04/21/2023    Scheduling Instructions:     Please try to schedule within the week    Order Specific  Question:   If indicated for the ordered procedure, I authorize the administration of contrast media per Radiology protocol    Answer:   Yes    Order Specific Question:   Does the patient have a contrast media/X-ray dye allergy?    Answer:   No    Order Specific Question:   Preferred imaging location?    Answer:   [facility]   CBC with Differential (Cancer Center Only)    Standing Status:   Future    Standing Expiration Date:   04/21/2023   CMP (Cancer Center only)    Standing Status:   Future    Standing Expiration Date:   04/21/2023   Magnesium    Standing Status:   Future    Standing Expiration Date:   04/21/2023      Johnette Abraham Michelene Keniston, PA-C 04/21/22  ADDENDUM: Hematology/Oncology Attending: I had a face-to-face encounter with the patient today.  I reviewed her records, lab and recommended her care plan.  This is a very pleasant 67 years old white female with a stage IV non-small cell lung cancer, adenocarcinoma presented with multifocal bilateral pulmonary nodules involving the right upper lobe as well as smaller bilateral groundglass opacity diagnosed in April 2023 with PD-L1 expression of 4% and positive KRAS G12C mutation.  The patient started systemic chemotherapy initially with carboplatin, Alimta and Keytruda for 4 cycles followed by 4 more cycles of maintenance treatment with Alimta and Keytruda but this was discontinued secondary to disease progression. The patient started second line treatment with targeted therapy with Caryn [place] (Adagrasib) initially 600 mg p.o. twice daily for 1 months but her dose was reduced to 400 mg p.o. twice daily secondary to intolerance.  Her treatment has been on hold since April 15, 2022 because of intolerance and increasing fatigue and weakness as well as lack of appetite.  The patient did not feel any difference in her condition after holding her treatment with Caryn Section (Adagrasib) for the last 6 days.  She continues to have bilateral  lower extremity edema as well as numbness and bandlike sensation around the back with radiation to the front.  She had lesion at T9 before. I recommended for the patient to hold her treatment with Caryn Section (Adagrasib) for now until we have repeat imaging studies of the chest, abdomen and pelvis for restaging of her disease. Will arrange for the patient to receive 1 L of normal saline with 4 g of magnesium sulfate in the clinic today because of her dehydration and hypomagnesemia. We will see the patient back for follow-up visit in 1 week for reevaluation and discussion of her scan results and further recommendation regarding her condition. She was advised to call immediately if she has any other concerning symptoms in the interval. The total time spent in the appointment was 30 minutes. Disclaimer: This note was dictated with voice recognition software. Similar sounding words can inadvertently be transcribed and may be missed upon review.  Lajuana Matte, MD

## 2022-04-21 ENCOUNTER — Inpatient Hospital Stay: Payer: Medicare Other

## 2022-04-21 ENCOUNTER — Inpatient Hospital Stay (HOSPITAL_BASED_OUTPATIENT_CLINIC_OR_DEPARTMENT_OTHER): Payer: Medicare Other | Admitting: Physician Assistant

## 2022-04-21 VITALS — BP 133/62 | HR 77 | Temp 97.6°F | Resp 15 | Wt 96.4 lb

## 2022-04-21 VITALS — BP 116/71 | HR 74 | Resp 16

## 2022-04-21 DIAGNOSIS — Z95828 Presence of other vascular implants and grafts: Secondary | ICD-10-CM

## 2022-04-21 DIAGNOSIS — E876 Hypokalemia: Secondary | ICD-10-CM

## 2022-04-21 DIAGNOSIS — C3411 Malignant neoplasm of upper lobe, right bronchus or lung: Secondary | ICD-10-CM | POA: Diagnosis not present

## 2022-04-21 DIAGNOSIS — I959 Hypotension, unspecified: Secondary | ICD-10-CM

## 2022-04-21 DIAGNOSIS — C3491 Malignant neoplasm of unspecified part of right bronchus or lung: Secondary | ICD-10-CM

## 2022-04-21 DIAGNOSIS — R609 Edema, unspecified: Secondary | ICD-10-CM | POA: Diagnosis not present

## 2022-04-21 DIAGNOSIS — R200 Anesthesia of skin: Secondary | ICD-10-CM | POA: Diagnosis not present

## 2022-04-21 LAB — CMP (CANCER CENTER ONLY)
ALT: 34 U/L (ref 0–44)
AST: 33 U/L (ref 15–41)
Albumin: 3.3 g/dL — ABNORMAL LOW (ref 3.5–5.0)
Alkaline Phosphatase: 208 U/L — ABNORMAL HIGH (ref 38–126)
Anion gap: 7 (ref 5–15)
BUN: 19 mg/dL (ref 8–23)
CO2: 26 mmol/L (ref 22–32)
Calcium: 7.9 mg/dL — ABNORMAL LOW (ref 8.9–10.3)
Chloride: 105 mmol/L (ref 98–111)
Creatinine: 0.89 mg/dL (ref 0.44–1.00)
GFR, Estimated: >60 mL/min (ref >60)
Glucose, Bld: 79 mg/dL (ref 70–99)
Potassium: 3.3 mmol/L — ABNORMAL LOW (ref 3.5–5.1)
Sodium: 138 mmol/L (ref 135–145)
Total Bilirubin: 0.5 mg/dL (ref 0.3–1.2)
Total Protein: 5.4 g/dL — ABNORMAL LOW (ref 6.5–8.1)

## 2022-04-21 LAB — MAGNESIUM: Magnesium: 1.3 mg/dL — ABNORMAL LOW (ref 1.7–2.4)

## 2022-04-21 LAB — CBC WITH DIFFERENTIAL (CANCER CENTER ONLY)
Abs Immature Granulocytes: 0.99 10*3/uL — ABNORMAL HIGH (ref 0.00–0.07)
Basophils Absolute: 0 10*3/uL (ref 0.0–0.1)
Basophils Relative: 0 %
Eosinophils Absolute: 0 10*3/uL (ref 0.0–0.5)
Eosinophils Relative: 0 %
HCT: 28.5 % — ABNORMAL LOW (ref 36.0–46.0)
Hemoglobin: 9.4 g/dL — ABNORMAL LOW (ref 12.0–15.0)
Immature Granulocytes: 6 %
Lymphocytes Relative: 21 %
Lymphs Abs: 3.3 10*3/uL (ref 0.7–4.0)
MCH: 31 pg (ref 26.0–34.0)
MCHC: 33 g/dL (ref 30.0–36.0)
MCV: 94.1 fL (ref 80.0–100.0)
Monocytes Absolute: 2.4 10*3/uL — ABNORMAL HIGH (ref 0.1–1.0)
Monocytes Relative: 15 %
Neutro Abs: 8.9 10*3/uL — ABNORMAL HIGH (ref 1.7–7.7)
Neutrophils Relative %: 58 %
Platelet Count: 439 10*3/uL — ABNORMAL HIGH (ref 150–400)
RBC: 3.03 MIL/uL — ABNORMAL LOW (ref 3.87–5.11)
RDW: 16.2 % — ABNORMAL HIGH (ref 11.5–15.5)
WBC Count: 15.6 10*3/uL — ABNORMAL HIGH (ref 4.0–10.5)
nRBC: 0 % (ref 0.0–0.2)

## 2022-04-21 MED ORDER — SODIUM CHLORIDE 0.9 % IV SOLN: INTRAVENOUS | Status: DC

## 2022-04-21 MED ORDER — HEPARIN SOD (PORK) LOCK FLUSH 100 UNIT/ML IV SOLN
500.0000 [IU] | Freq: Once | INTRAVENOUS | Status: AC
  Administered 2022-04-21: 500 [IU]

## 2022-04-21 MED ORDER — MAGNESIUM SULFATE 4 GM/100ML IV SOLN
4.0000 g | Freq: Once | INTRAVENOUS | Status: AC
  Administered 2022-04-21: 4 g via INTRAVENOUS
  Filled 2022-04-21: qty 100

## 2022-04-21 MED ORDER — POTASSIUM CHLORIDE IN NACL 20-0.9 MEQ/L-% IV SOLN
Freq: Once | INTRAVENOUS | Status: AC
  Filled 2022-04-21: qty 1000

## 2022-04-21 MED ORDER — SODIUM CHLORIDE 0.9% FLUSH
10.0000 mL | Freq: Once | INTRAVENOUS | Status: AC
  Administered 2022-04-21: 10 mL

## 2022-04-21 MED ORDER — SODIUM CHLORIDE 0.9 % IV SOLN: Freq: Once | INTRAVENOUS | Status: DC

## 2022-04-22 ENCOUNTER — Telehealth: Payer: Self-pay | Admitting: Internal Medicine

## 2022-04-22 ENCOUNTER — Encounter: Payer: Self-pay | Admitting: Internal Medicine

## 2022-04-22 ENCOUNTER — Other Ambulatory Visit: Payer: Self-pay | Admitting: Physician Assistant

## 2022-04-22 ENCOUNTER — Ambulatory Visit (HOSPITAL_BASED_OUTPATIENT_CLINIC_OR_DEPARTMENT_OTHER)
Admission: RE | Admit: 2022-04-22 | Discharge: 2022-04-22 | Disposition: A | Discharge status: Home / Self Care | Payer: Medicare Other | Source: Ambulatory Visit | Attending: Physician Assistant | Admitting: Physician Assistant

## 2022-04-22 DIAGNOSIS — C3491 Malignant neoplasm of unspecified part of right bronchus or lung: Secondary | ICD-10-CM | POA: Insufficient documentation

## 2022-04-22 MED ORDER — TIZANIDINE HCL 2 MG PO TABS: 2.0000 mg | ORAL_TABLET | Freq: Three times a day (TID) | ORAL | 2 refills | Status: DC | PRN

## 2022-04-22 MED ORDER — IOHEXOL 300 MG/ML  SOLN
100.0000 mL | Freq: Once | INTRAMUSCULAR | Status: AC | PRN
  Administered 2022-04-22: 60 mL via INTRAVENOUS

## 2022-04-22 NOTE — Telephone Encounter (Signed)
Called patient per 3/1 secure chat to confirm new appointment. Left vm.

## 2022-04-22 NOTE — Progress Notes (Unsigned)
[facility] OFFICE PROGRESS NOTE  Sue Peng, NP [place] [address]  DIAGNOSIS: Stage IV (T2a, N0, M1a) non-small cell lung cancer, adenocarcinoma presented with multifocal bilateral pulmonary nodules involving the right upper lobe as well as the smaller bilateral groundglass opacities diagnosed in April 2023.  PD-L1 expression is 4%.   Molecular studies by [employer] tissue test showed positive KRAS G12C mutation but the blood test failed secondary to insufficient circulating tumor DNA.    PRIOR THERAPY: Systemic chemotherapy with carboplatin for AUC of 5, Alimta 500 Mg/M2 and Keytruda 200 Mg IV every 3 weeks.  First dose 08/10/2021.  Status post 8 cycles.  Starting from cycle #5 the patient is on maintenance treatment with Alimta and Keytruda every 3 weeks.  This treatment was discontinued secondary to disease progression     CURRENT THERAPY: Krazati (Adagrasib) 600 mg p.o. twice daily.  First dose started 03/08/2022. Status post about 1 month of treatment. Will reduce dose to 400 mg BID starting from today 04/05/22 due to intolerance. Treatment currently on hold starting from 04/15/22 due to intolerance.   INTERVAL HISTORY: Sue Chase 67 y.o. female returns to the clinic today for a follow-up visit accompanied by her daughter.  The patient was last seen by myself and Dr. Julien Nordmann last week. In summary, the patient was recently found to have evidence of disease progression in January 2024.  Therefore, she was then switched to treatment with targeted treatment with Alfred Levins.  She had been on this for approximately 1 month but started developing adverse side effects of treatment with weakness, weight loss, poor appetite, nausea, vomiting, and electrolyte derangements.  She was seen in the clinic and in the emergency room multiple times for weakness and received IV fluids and electrolytes.  The patient called the clinic on 04/15/2022 reporting  continued weakness and poor quality of life and her treatment has been on hold for approximately a week and a half.  When she was seen last week, she is only minimally feeling better.    Last week when she was seen, she was endorsing bandlike sensation around her diaphragm.  She had a repeat CT scan last week which did not show any acute findings to explain her symptoms.  She did have a ultrasound of the abdomen performed in the emergency room few weeks ago which showed a contracted gallbladder.  She was started on a muscle relaxer with mild improvement.  She also has taken Tylenol on some occasions with mild improvement.  However she has not taken any Tylenol or muscle relaxers at the same time.  Last week, she also was endorsing lower extremity swelling and pitting edema. Lasix pills do not agree with her due to causing dizziness/lightheadedness.  She was instructed to purchase compression stockings.  Since last being seen her swelling has gait stable.  The patient lives alone.  She has a hard time getting the compressions on due to some numbness in her hands.  She was also encouraged to increase her protein supplement use.  She was previously put on Megace for her appetite but she has only taken this for 1 dose.  She still has not resumed Megace because she does not feel that she needs it at this time.  She was also endorsing numbness in her hands and feet bilaterally last week.  Her calcium was found to be low 2 weeks ago.  She takes a calcium supplement when she is able to.  But  sometimes, the calcium supplements cause constipation.  Her numbness and tingling is the same at this time.  She is scheduled for brain MRI later this week to rule out any metastatic disease to the brain or other etiology for her numbness.  This is scheduled for 04/28/2022.  Last week, the patient and her daughter were asking about home health agencies.  However it sounds like there needs are someone to help with cleaning,  transportation, and cooking.  Otherwise, denies any new complaints today.  She denies any fever, chills, night sweats. She takes her magnesium supplement when she is able but she has not been able to take this daily.  She states that she takes half a tablet at a time sometimes.  She has required IV magnesium and potassium several times over the course the last few weeks.  Since she was on steroids last week, that helped her appetite a little bit.  She denies any significant nausea or vomiting at this time. She reports that she does not have any significant diarrhea since last week but she knows to take Imodium if needed.  The patient recently had a restaging CT scan performed.  She is here today for evaluation, repeat blood work, and to review her scan results and discuss the next steps in her care.   MEDICAL HISTORY: Past Medical History:  Diagnosis Date   Colon polyps    Complication of anesthesia    tolerated propofol but other meds made her emotional / cry/ difficulty breathing/ panic   Hyperlipidemia    Rheumatic fever    Seasonal allergies    UTI (urinary tract infection)     ALLERGIES:  is allergic to codeine.  MEDICATIONS:  Current Outpatient Medications  Medication Sig Dispense Refill   acetaminophen (TYLENOL) 500 MG tablet Take 500 mg by mouth every 6 (six) hours as needed for moderate pain.     adagrasib (KRAZATI) 200 MG tablet Take 3 tablets (600 mg total) by mouth 2 (two) times daily. 180 tablet 3   albuterol (VENTOLIN HFA) 108 (90 Base) MCG/ACT inhaler Inhale 2 puffs into the lungs every 6 (six) hours as needed for wheezing or shortness of breath. 8 g 5   apixaban (ELIQUIS) 2.5 MG TABS tablet Take 1 tablet (2.5 mg total) by mouth 2 (two) times daily. Start this treatment once you receive the targeted therapy with Alfred Levins (Adagrasib).  Continue the other Eliquis 5 mg twice daily 60 tablet 2   Budeson-Glycopyrrol-Formoterol (BREZTRI AEROSPHERE) 160-9-4.8 MCG/ACT AERO Inhale 2  puffs into the lungs in the morning and at bedtime. 10.7 g 5   FeFum-FePoly-FA-B Cmp-C-Biot (INTEGRA PLUS) CAPS Take 1 capsule by mouth every morning. (Patient taking differently: Take 1 capsule by mouth 2 (two) times a week.) 30 capsule 2   lidocaine-prilocaine (EMLA) cream Apply to the Port-A-Cath site 30 minutes before chemotherapy (Patient taking differently: Apply 1 Application topically daily as needed. Apply to the Port-A-Cath site 30 minutes before chemotherapy) 30 g 0   magnesium oxide (MAG-OX) 400 (240 Mg) MG tablet Take 1 tablet (400 mg total) by mouth daily. 30 tablet 0   megestrol (MEGACE) 20 MG tablet Take 1 tablet (20 mg total) by mouth daily. 30 tablet 2   methylPREDNISolone (MEDROL DOSEPAK) 4 MG TBPK tablet Use as instructed 21 tablet 0   omeprazole (PRILOSEC) 20 MG capsule Take 1 capsule (20 mg total) by mouth daily. 30 capsule 3   ondansetron (ZOFRAN-ODT) 4 MG disintegrating tablet Dissolve 1 tablet (4 mg total)  by mouth every 8 (eight) hours as needed for nausea or vomiting. 20 tablet 0   potassium chloride SA (KLOR-CON M) 20 MEQ tablet Take 20 mEq by mouth daily.     prochlorperazine (COMPAZINE) 10 MG tablet Take 1 tablet (10 mg total) by mouth every 6 (six) hours as needed for nausea or vomiting. 30 tablet 0   tiZANidine (ZANAFLEX) 2 MG tablet Take 1 tablet (2 mg total) by mouth every 8 (eight) hours as needed for muscle spasms. 30 tablet 2   No current facility-administered medications for this visit.    SURGICAL HISTORY:  Past Surgical History:  Procedure Laterality Date   BRONCHIAL BIOPSY  06/08/2021   Procedure: BRONCHIAL BIOPSIES;  Surgeon: Garner Nash, DO;  Location: [facility];  Service: Pulmonary;;   BRONCHIAL BRUSHINGS  06/08/2021   Procedure: BRONCHIAL BRUSHINGS;  Surgeon: Garner Nash, DO;  Location: [facility];  Service: Pulmonary;;   BRONCHIAL NEEDLE ASPIRATION BIOPSY  06/08/2021   Procedure: BRONCHIAL NEEDLE ASPIRATION BIOPSIES;  Surgeon: Garner Nash, DO;  Location: [facility];  Service: Pulmonary;;   COLONOSCOPY  2018   ERCP N/A 11/05/2021   Procedure: ENDOSCOPIC RETROGRADE CHOLANGIOPANCREATOGRAPHY (ERCP);  Surgeon: Irene Shipper, MD;  Location: [facility];  Service: Gastroenterology;  Laterality: N/A;   IR IMAGING GUIDED PORT INSERTION  08/12/2021   REMOVAL OF STONES  11/05/2021   Procedure: REMOVAL OF STONES;  Surgeon: Irene Shipper, MD;  Location: [facility];  Service: Gastroenterology;;   SALIVARY GLAND SURGERY     LATE 80S   SPHINCTEROTOMY  11/05/2021   Procedure: SPHINCTEROTOMY;  Surgeon: Irene Shipper, MD;  Location: [facility];  Service: Gastroenterology;;   TUBAL LIGATION  1986   VIDEO BRONCHOSCOPY WITH RADIAL ENDOBRONCHIAL ULTRASOUND  06/08/2021   Procedure: VIDEO BRONCHOSCOPY WITH RADIAL ENDOBRONCHIAL ULTRASOUND;  Surgeon: Garner Nash, DO;  Location: [facility];  Service: Pulmonary;;    REVIEW OF SYSTEMS:   Constitutional: Positive for weakness, continued poor appetite, and fatigue.  Negative for chills and fever.  HENT: Negative for mouth sores, nosebleeds, sore throat and trouble swallowing.  Eyes: Negative for eye problems and icterus.  Respiratory: Stable for baseline dyspnea exertion and cough.  Negative for  hemoptysis and wheezing.   Cardiovascular: Positive for bilateral lower extremity pitting edema.  Negative for chest pain.  Gastrointestinal: Positive for bandlike soreness in the diaphragm area worse in the right upper quadrant.  Negative for significant constipation, diarrhea, nausea and vomiting.  Genitourinary: Negative for bladder incontinence, difficulty urinating, dysuria, frequency and hematuria.   Musculoskeletal: Negative for back pain, gait problem, neck pain and neck stiffness.  Skin: Negative for itching and rash.  Neurological: Positive for numbness in the hands bilaterally.  Negative for dizziness, extremity weakness, gait problem, headaches, light-headedness and seizures.   Hematological: Negative for adenopathy. Does not bruise/bleed easily.  Psychiatric/Behavioral: Negative for confusion, depression and sleep disturbance. The patient is not nervous/anxious.     PHYSICAL EXAMINATION:  There were no vitals taken for this visit.  ECOG PERFORMANCE STATUS: 2  Physical Exam  Constitutional: Oriented to person, place, and time and cachectic appearing female, and in no distress. HENT:  Head: Normocephalic and atraumatic.  Mouth/Throat: Oropharynx is clear and moist. No oropharyngeal exudate.  Eyes: Conjunctivae are normal. Right eye exhibits no discharge. Left eye exhibits no discharge. No scleral icterus.  Neck: Normal range of motion. Neck supple.  Cardiovascular: Normal rate, regular rhythm, normal heart sounds and intact distal pulses.   Pulmonary/Chest:  Effort normal and breath sounds normal. No respiratory distress. No wheezes. No rales.  Abdominal: Soft. Bowel sounds are normal. Exhibits no distension and no mass.  Upper quadrant tenderness. Musculoskeletal: Normal range of motion.  Bilateral pitting edema in the lower extremities Lymphadenopathy:    No cervical adenopathy.  Neurological: Alert and oriented to person, place, and time. Exhibits normal muscle tone. Gait normal. Coordination normal.  Skin: Skin is warm and dry. No rash noted. Not diaphoretic. No erythema. No pallor.  Psychiatric: Mood, memory and judgment normal.  Vitals reviewed.  LABORATORY DATA: Lab Results  Component Value Date   WBC 15.6 (H) 04/21/2022   HGB 9.4 (L) 04/21/2022   HCT 28.5 (L) 04/21/2022   MCV 94.1 04/21/2022   PLT 439 (H) 04/21/2022      Chemistry      Component Value Date/Time   NA 138 04/21/2022 0812   K 3.3 (L) 04/21/2022 0812   CL 105 04/21/2022 0812   CO2 26 04/21/2022 0812   BUN 19 04/21/2022 0812   CREATININE 0.89 04/21/2022 0812      Component Value Date/Time   CALCIUM 7.9 (L) 04/21/2022 0812   ALKPHOS 208 (H) 04/21/2022 0812   AST 33  04/21/2022 0812   ALT 34 04/21/2022 0812   BILITOT 0.5 04/21/2022 0812       RADIOGRAPHIC STUDIES:  CT CHEST ABDOMEN PELVIS W CONTRAST  Result Date: 04/22/2022 CLINICAL DATA:  Assess metastatic disease, staging non-small-cell lung cancer. * Tracking Code: BO * EXAM: CT CHEST, ABDOMEN, AND PELVIS WITH CONTRAST TECHNIQUE: Multidetector CT imaging of the chest, abdomen and pelvis was performed following the standard protocol during bolus administration of intravenous contrast. RADIATION DOSE REDUCTION: This exam was performed according to the departmental dose-optimization program which includes automated exposure control, adjustment of the mA and/or kV according to patient size and/or use of iterative reconstruction technique. CONTRAST:  57m OMNIPAQUE IOHEXOL 300 MG/ML  SOLN COMPARISON:  CT angiogram chest 02/09/2022. Standard chest CT and abdomen CT 12/30/2021. FINDINGS: CT CHEST FINDINGS Cardiovascular: Right upper chest port. Heart is nonenlarged. Trace pericardial fluid. There is ectasia of the ascending aorta with diameter approaching 4.0 cm. Similar to previous examination. There are areas of irregular plaque with some penetrating ulcers along the aortic arch towards the left side, unchanged from previous. No dissection identified. Scattered plaque. Coronary artery calcifications are also seen. There is some enlargement of the central pulmonary arteries. Please correlate with any evidence of pulmonary artery hypertension. Mediastinum/Nodes: No specific abnormal lymph node enlargement identified in the axillary region, hilum or mediastinum. This includes chest wall and supraclavicular and internal mammary chain. There are some small mediastinal nodes, less than a cm in short axis and not pathologic by size criteria. Normal caliber thoracic esophagus. Lungs/Pleura: Lungs are hyperinflated. Centrilobular emphysematous changes are seen with some apical pleural thickening. No pneumothorax, effusion or  consolidation. There are some areas of nodularity identified. Many of these areas are ground-glass like. This includes a stable area nodularity of the ground-glass and some retraction of the adjacent major fissure on series 4, image 47. This area previously measured 3.4 x 2.1 cm and today when measured in the same fashion on series 4, image 43 3.1 by 2.3 cm. Previously there were patchy nodular areas of ground-glass elsewhere in the superior segment of the right lower lobe as well. These have improved overall with some residual such as a focus in the superior segment on image 54 of series 4 measuring 6 mm. Minimal areas  in the left lung are identified as well today but are also improving from previous exam in size and number. There is a standard more solid nodule along the right lower lobe anteriorly abutting the fissure on series 4, image 107 measuring 7 x 7 mm. Previously 7 mm as well. No new lung nodules identified. Musculoskeletal: Mild curvature of the spine. Stable compression of the vertebral body at T9. Stable mild compression of the inferior endplate of L3. CT ABDOMEN PELVIS FINDINGS Hepatobiliary: No space-occupying liver lesion. Gallbladder is present. Patent portal vein. Pancreas: Unremarkable. No pancreatic ductal dilatation or surrounding inflammatory changes. Spleen: Homogeneous enhancement of the spleen. The spleen is nonenlarged. Adrenals/Urinary Tract: Adrenal glands are preserved. No enhancing renal mass or collecting system dilatation. There is some focal atrophy along the upper pole of the right kidney. The ureters have normal course and caliber extending down to the bladder. Preserved contours of the urinary bladder. Stomach/Bowel: Moderate colonic stool. Large bowel is of normal course and caliber. Normal appendix. Sigmoid colon diverticula. Stomach is nondilated. Small bowel is nondilated. Vascular/Lymphatic: Normal caliber aorta and IVC with some scattered atherosclerotic changes. No  specific abnormal lymph node enlargement identified in the abdomen and pelvis. There is also areas of atherosclerotic plaque along the common femoral arteries. Significant stenosis seen bilaterally of close to 80%. Please correlate with any particular symptoms of claudication. Reproductive: Uterus and bilateral adnexa are unremarkable. Other: Anasarca. Trace perisplenic ascites. Small umbilical anterior abdominal wall fat containing hernia. Musculoskeletal: Mild degenerative changes along the spine of the pelvis. As stated above there is slight compression of the inferior endplate of L3 which is stable. IMPRESSION: Previously there are multiple ground-glass and solid nodules. The ground-glass areas of nodularity have overall decreased in size and number. Residual areas are similar to previous. Additional solid nodule in the right lower lobe and mixed nodule in the dependent right upper lobe. Recommend continued surveillance in 3 months. No developing new mass lesion, fluid collection or lymph node enlargement in the chest, abdomen and pelvis. Trace perisplenic ascites, nonspecific. Diffuse colonic stool.  Colonic diverticula of the sigmoid region. Stable irregular plaque along the aortic arch with mild ectasia of the ascending aorta. Areas of plaque ulceration along the arch. In addition there are areas of significant stenosis along both common femoral arteries. Please correlate for any clinical symptomatology of the lower extremities. Recommend semi-annual imaging followup by CTA or MRA and referral to cardiothoracic surgery if not already obtained. This recommendation follows 2010 ACCF/AHA/AATS/ACR/ASA/SCA/SCAI/SIR/STS/SVM Guidelines for the Diagnosis and Management of Patients With Thoracic Aortic Disease. Circulation. 2010; [id number]. Aortic aneurysm NOS (ICD10-I71.9) Aortic Atherosclerosis (ICD10-I70.0) and Emphysema (ICD10-J43.9). Electronically Signed   By: Jill Side M.D.   On: 04/22/2022 13:15   US  Abdomen Limited RUQ (LIVER/GB)  Result Date: 04/09/2022 CLINICAL DATA:  Elevated LFTs EXAM: ULTRASOUND ABDOMEN LIMITED RIGHT UPPER QUADRANT COMPARISON:  12/30/2021 CT, ultrasound from 11/03/2021 FINDINGS: Gallbladder: Gallbladder is decompressed accentuating the wall thickness. No pericholecystic fluid is noted. Negative sonographic Murphy's sign is elicited. No gallstones are seen. Common bile duct: Diameter: 3 mm Liver: Increase in echogenicity likely related to fatty infiltration. No focal mass is seen. Portal vein is patent on color Doppler imaging with normal direction of blood flow towards the liver. Other: None. IMPRESSION: Partially contracted gallbladder. Fatty infiltration of the liver. Electronically Signed   By: Inez Catalina M.D.   On: 04/09/2022 21:15   DG Chest Portable 1 View  Result Date: 04/09/2022 CLINICAL DATA:  Increasing  weakness and fatigue EXAM: PORTABLE CHEST 1 VIEW COMPARISON:  02/17/2022 FINDINGS: Cardiac shadow is stable. Lungs are hyperinflated but clear. Right chest wall port is noted. Previously seen right upper lobe opacity is less prominent than on the prior exam. No new focal abnormality is seen. IMPRESSION: Known right upper lobe opacity is less well visualized than on the prior exam. No new focal abnormality is noted. Electronically Signed   By: Inez Catalina M.D.   On: 04/09/2022 19:24     ASSESSMENT/PLAN:  This very pleasant 67 year old Caucasian female diagnosed with stage IV (T2 a, N0, M1 a) non-small cell lung cancer, adenocarcinoma.  The patient presented multifocal bilateral pulmonary nodules involving the right upper lobe as well as small bilateral groundglass opacities.  She was diagnosed in April 2023.  She is positive for a K-ras G12 C mutation and her PD-L1 expression of 4%.  Her K-ras G12 C mutation can be targeted in the second line setting in the future.    The patient is currently undergoing systemic chemotherapy with carboplatin for an AUC of 5, Alimta  500 mg per metered square, Keytruda 200 mg IV every 3 weeks.  She is status post 8 cycles.  She tolerates this fairly well. Starting from cycle #5, she started maintenance alimta and [place].   In early January the patient had a repeat CT scan that showed possible progression with increased size of the nodules in the posterior right upper lobe as well as increase in size of the innumerable bilateral pulmonary nodules.   Therefore, Dr. Julien Nordmann discontinued her treatment Alimta Keytruda due to the disease progression.   She was then started on Krazati 600 mg p.o. twice daily on 03/08/2022.  She tolerated this well for the first 2 weeks but then she developed nausea, vomiting, and diarrhea for which she was seen in the symptom management clinic several times for IV fluids and electrolyte replacement.  She has also went to the ER for similar symptoms. She was seen on 04/05/2022 her dose of Alfred Levins was reduced to 400 mg twice daily.  Her treatment has been on hold since 04/14/2022 due to intolerance.  She still was endorsing weakness and electrolyte derangements. Her most recent was last week on 04/21/2022 for which she received IV magnesium and fluid.   The patient recently had a restaging CT scan of the chest.  Dr. Julien Nordmann personally and independently reviewed the scan discussed results with the patient today.  The scan showed some improvement in the previously noted multiple groundglass and solid nodules.  Some the nodularity has decreased in size and in number..  There is no evidence of disease progression.  Dr. Julien Nordmann recommends that the patient continue on observation with close interval follow-up CT scan in 2 months to allow time to recover.  We will then see her back for follow-up visit at that time to review the results.  Of course would be happy to see her sooner if she has any worsening symptoms.  She will have a brain MRI performed on 04/28/22.  If there are any concerning findings, I will call the  patient to relay the results.  I have sent the patient a prescription for Ativan since she is claustrophobic to take 1 tablet 30 minutes before her MRI.  The patient continues to show hypomagnesemia and hypokalemia which is likely refractory due to her low magnesium.  The patient has not been taking her potassium and magnesium as instructed.  I instructed her to take her magnesium  every day.  She also states she has plenty of calcium supplements at home.  Advised her to take 1 to 20 mEq tablet twice daily for 7 days.  I will then see her back for a lab only visit and flush in approximately 2 weeks to recheck on her labs.  Mohamed also encouraged her to take an iron supplement.   No clear etiology was seen on her scan to explain her tightness in her diaphragm.  She does have some mild relief with Tylenol and muscle relaxers.  I encouraged her to try taking both of these to see if that helps improve her symptoms.  For her lower extremity swelling, the patient has normal ejection fraction from echocardiogram last month.  He states that she has 6 tablets of Lasix at home.  I encouraged her to monitor her blood pressure closely at home.  If her blood pressure permits, she was instructed to take 1 tablet daily as needed for swelling.  In addition to her potassium supplements, she would need to increase her dietary intake of potassium as well.  I also discussed that it is okay to crush her potassium tablets it take them in applesauce if she has a hard time swallowing them.  I also reviewed tips on how to get her compression stockings on easier.  I asked that she try and elicit help from friends or family.  We also had a lengthy discussion about home health agencies.  After discussion of her needs, it sounds like she needs a companion/personal care assistant as opposed to home health nursing care.  I mentioned I be happy to refer her to social work to see if there is any community resources such as seniors helping  seniors.  They declined referral to social worker at this time. The patient does not like the protein supplemental drinks at her sole commercially and I encouraged her to make her own protein supplemental drinks.   The patient was advised to call immediately if she has any concerning symptoms in the interval. The patient voices understanding of current disease status and treatment options and is in agreement with the current care plan. All questions were answered. The patient knows to call the clinic with any problems, questions or concerns. We can certainly see the patient much sooner if necessary   No orders of the defined types were placed in this encounter.    Esvin Hnat L Shivank Pinedo, PA-C 04/22/22  ADDENDUM: Hematology/Oncology Attending: I had a face-to-face encounter with the patient today.  I reviewed her record, lab, scan and recommended her care plan.  This is a very pleasant 67 years old white female with a stage IV non-small cell lung cancer, adenocarcinoma presented with multifocal bilateral pulmonary nodules diagnosed in April 2023 with positive KRAS G12C mutation and PD-L1 expression of 4%. The patient started systemic chemotherapy with carboplatin, Alimta and Keytruda for 4 induction cycles followed by maintenance treatment with Alimta and Keytruda for 3 more cycles discontinued secondary to disease progression. She started second line treatment with targeted therapy with Alfred Levins (Adagrasib) initially at 600 mg p.o. twice daily for 1 months but her dose was reduced to 400 mg p.o. twice daily secondary to intolerance.  Her treatment has been on hold since April 15, 2022 because of increasing fatigue and weakness as well as poor appetite, weight loss as well as nausea and vomiting and occasional diarrhea. She was treated with Medrol Dosepak with some improvement in her condition.  She also received IV hydration  last week.  The patient also has bandlike sensation around the lower  rib cage. She had repeat CT scan of the chest, abdomen and pelvis performed recently.  I personally and independently reviewed the scan images and discussed the result with the patient and her daughter.  Her scan showed multiple groundglass and solid nodules but these are improved compared to the previous scan and there was some residual area similar to the previous with no evidence for new or progressive disease.  There was no clear explanation for the bandlike numbness and pain at the lower rib cage and no developing new mass lesion, fluid collection or lymph node enlargement in the chest, abdomen or pelvis. Because of her current poor condition I recommended for the patient to continue on observation and holding her treatment with Alfred Levins (Adagrasib) for the next 2 months. Will see her back for follow-up visit at that time with repeat CT scan of the chest, abdomen and pelvis for restaging of her disease. She was encouraged to increase her oral intake and start Megace for the appetite. The patient was advised to call immediately if she has any other concerning symptoms in the interval. The total time spent in the appointment was 30 minutes. Disclaimer: This note was dictated with voice recognition software. Similar sounding words can inadvertently be transcribed and may be missed upon review. Eilleen Kempf, MD

## 2022-04-26 ENCOUNTER — Other Ambulatory Visit (HOSPITAL_COMMUNITY): Payer: Self-pay

## 2022-04-26 ENCOUNTER — Inpatient Hospital Stay: Payer: Medicare Other | Attending: Oncology | Admitting: Physician Assistant

## 2022-04-26 ENCOUNTER — Inpatient Hospital Stay: Payer: Medicare Other

## 2022-04-26 VITALS — BP 123/71 | HR 82 | Temp 97.8°F | Resp 14 | Ht 62.0 in | Wt 97.3 lb

## 2022-04-26 DIAGNOSIS — Z95828 Presence of other vascular implants and grafts: Secondary | ICD-10-CM

## 2022-04-26 DIAGNOSIS — C3411 Malignant neoplasm of upper lobe, right bronchus or lung: Secondary | ICD-10-CM | POA: Insufficient documentation

## 2022-04-26 DIAGNOSIS — M7989 Other specified soft tissue disorders: Secondary | ICD-10-CM | POA: Diagnosis not present

## 2022-04-26 DIAGNOSIS — R200 Anesthesia of skin: Secondary | ICD-10-CM | POA: Diagnosis not present

## 2022-04-26 DIAGNOSIS — F40240 Claustrophobia: Secondary | ICD-10-CM

## 2022-04-26 DIAGNOSIS — C3491 Malignant neoplasm of unspecified part of right bronchus or lung: Secondary | ICD-10-CM

## 2022-04-26 DIAGNOSIS — E876 Hypokalemia: Secondary | ICD-10-CM

## 2022-04-26 LAB — CBC WITH DIFFERENTIAL (CANCER CENTER ONLY)
Abs Immature Granulocytes: 0.17 10*3/uL — ABNORMAL HIGH (ref 0.00–0.07)
Basophils Absolute: 0 10*3/uL (ref 0.0–0.1)
Basophils Relative: 0 %
Eosinophils Absolute: 0.1 10*3/uL (ref 0.0–0.5)
Eosinophils Relative: 1 %
HCT: 27.8 % — ABNORMAL LOW (ref 36.0–46.0)
Hemoglobin: 9.2 g/dL — ABNORMAL LOW (ref 12.0–15.0)
Immature Granulocytes: 2 %
Lymphocytes Relative: 23 %
Lymphs Abs: 2 10*3/uL (ref 0.7–4.0)
MCH: 31 pg (ref 26.0–34.0)
MCHC: 33.1 g/dL (ref 30.0–36.0)
MCV: 93.6 fL (ref 80.0–100.0)
Monocytes Absolute: 1.4 10*3/uL — ABNORMAL HIGH (ref 0.1–1.0)
Monocytes Relative: 16 %
Neutro Abs: 5.1 10*3/uL (ref 1.7–7.7)
Neutrophils Relative %: 58 %
Platelet Count: 229 10*3/uL (ref 150–400)
RBC: 2.97 MIL/uL — ABNORMAL LOW (ref 3.87–5.11)
RDW: 15.9 % — ABNORMAL HIGH (ref 11.5–15.5)
WBC Count: 8.7 10*3/uL (ref 4.0–10.5)
nRBC: 0 % (ref 0.0–0.2)

## 2022-04-26 LAB — CMP (CANCER CENTER ONLY)
ALT: 19 U/L (ref 0–44)
AST: 26 U/L (ref 15–41)
Albumin: 3.4 g/dL — ABNORMAL LOW (ref 3.5–5.0)
Alkaline Phosphatase: 144 U/L — ABNORMAL HIGH (ref 38–126)
Anion gap: 8 (ref 5–15)
BUN: 13 mg/dL (ref 8–23)
CO2: 26 mmol/L (ref 22–32)
Calcium: 8 mg/dL — ABNORMAL LOW (ref 8.9–10.3)
Chloride: 107 mmol/L (ref 98–111)
Creatinine: 1.03 mg/dL — ABNORMAL HIGH (ref 0.44–1.00)
GFR, Estimated: 60 mL/min — ABNORMAL LOW (ref >60)
Glucose, Bld: 86 mg/dL (ref 70–99)
Potassium: 2.9 mmol/L — ABNORMAL LOW (ref 3.5–5.1)
Sodium: 141 mmol/L (ref 135–145)
Total Bilirubin: 0.5 mg/dL (ref 0.3–1.2)
Total Protein: 5.9 g/dL — ABNORMAL LOW (ref 6.5–8.1)

## 2022-04-26 LAB — SAMPLE TO BLOOD BANK

## 2022-04-26 LAB — MAGNESIUM: Magnesium: 1.4 mg/dL — ABNORMAL LOW (ref 1.7–2.4)

## 2022-04-26 MED ORDER — SODIUM CHLORIDE 0.9% FLUSH
10.0000 mL | Freq: Once | INTRAVENOUS | Status: AC
  Administered 2022-04-26: 10 mL

## 2022-04-26 MED ORDER — LORAZEPAM 0.5 MG PO TABS: ORAL_TABLET | ORAL | 0 refills | Status: DC

## 2022-04-26 MED ORDER — HEPARIN SOD (PORK) LOCK FLUSH 100 UNIT/ML IV SOLN
500.0000 [IU] | Freq: Once | INTRAVENOUS | Status: AC
  Administered 2022-04-26: 500 [IU]

## 2022-04-27 ENCOUNTER — Encounter: Payer: Medicare Other | Admitting: Dietician

## 2022-04-27 ENCOUNTER — Telehealth: Payer: Self-pay | Admitting: Dietician

## 2022-04-27 ENCOUNTER — Telehealth: Payer: Self-pay | Admitting: Internal Medicine

## 2022-04-27 NOTE — Telephone Encounter (Signed)
Called patient regarding upcoming March appointments, left a voicemail.

## 2022-04-27 NOTE — Telephone Encounter (Signed)
Attempted to reach patient for a scheduled remote nutrition consult. Provided my cell# on voice mail and sent text to cell to return call for follow up nutrition consult.  April Manson, RDN, LDN Registered Dietitian, [place] Part Time Remote (Usual office hours: Tuesday-Thursday) Cell: [phone]

## 2022-04-28 ENCOUNTER — Ambulatory Visit (HOSPITAL_COMMUNITY)
Admission: RE | Admit: 2022-04-28 | Discharge: 2022-04-28 | Disposition: A | Discharge status: Home / Self Care | Payer: Medicare Other | Source: Ambulatory Visit | Attending: Physician Assistant | Admitting: Physician Assistant

## 2022-04-28 DIAGNOSIS — R200 Anesthesia of skin: Secondary | ICD-10-CM

## 2022-04-28 DIAGNOSIS — C3491 Malignant neoplasm of unspecified part of right bronchus or lung: Secondary | ICD-10-CM | POA: Diagnosis present

## 2022-04-28 MED ORDER — GADOBUTROL 1 MMOL/ML IV SOLN
6.0000 mL | Freq: Once | INTRAVENOUS | Status: AC | PRN
  Administered 2022-04-28: 6 mL via INTRAVENOUS

## 2022-04-29 ENCOUNTER — Other Ambulatory Visit (HOSPITAL_COMMUNITY): Payer: Self-pay

## 2022-04-30 ENCOUNTER — Encounter: Payer: Self-pay | Admitting: Physician Assistant

## 2022-05-02 ENCOUNTER — Other Ambulatory Visit (HOSPITAL_COMMUNITY): Payer: Self-pay

## 2022-05-03 ENCOUNTER — Telehealth: Payer: Self-pay | Admitting: Dietician

## 2022-05-03 NOTE — Telephone Encounter (Signed)
Second attempt to reach patient for a remote nutrition consult to follow up on her PO intake. Provided my cell# on voice mail and sent text to cell to return call for follow up nutrition consult.  April Manson, RDN, LDN Registered Dietitian, [place] Part Time Remote (Usual office hours: Tuesday-Thursday) Cell: [phone]

## 2022-05-06 ENCOUNTER — Telehealth: Payer: Self-pay | Admitting: Internal Medicine

## 2022-05-06 NOTE — Telephone Encounter (Signed)
Called patient regarding upcoming March/May appointments, left a voicemail.

## 2022-05-10 ENCOUNTER — Inpatient Hospital Stay: Payer: Medicare Other

## 2022-05-10 ENCOUNTER — Ambulatory Visit: Payer: Medicare Other | Admitting: Internal Medicine

## 2022-05-10 ENCOUNTER — Telehealth: Payer: Self-pay

## 2022-05-10 ENCOUNTER — Encounter: Payer: Self-pay | Admitting: Physician Assistant

## 2022-05-10 ENCOUNTER — Telehealth: Payer: Self-pay | Admitting: Physician Assistant

## 2022-05-10 ENCOUNTER — Other Ambulatory Visit: Payer: Medicare Other

## 2022-05-10 ENCOUNTER — Ambulatory Visit: Payer: Medicare Other

## 2022-05-10 DIAGNOSIS — E876 Hypokalemia: Secondary | ICD-10-CM

## 2022-05-10 DIAGNOSIS — C3411 Malignant neoplasm of upper lobe, right bronchus or lung: Secondary | ICD-10-CM | POA: Diagnosis not present

## 2022-05-10 DIAGNOSIS — Z95828 Presence of other vascular implants and grafts: Secondary | ICD-10-CM

## 2022-05-10 DIAGNOSIS — C3491 Malignant neoplasm of unspecified part of right bronchus or lung: Secondary | ICD-10-CM

## 2022-05-10 LAB — CBC WITH DIFFERENTIAL (CANCER CENTER ONLY)
Abs Immature Granulocytes: 0.02 10*3/uL (ref 0.00–0.07)
Basophils Absolute: 0.1 10*3/uL (ref 0.0–0.1)
Basophils Relative: 1 %
Eosinophils Absolute: 0.4 10*3/uL (ref 0.0–0.5)
Eosinophils Relative: 5 %
HCT: 30.4 % — ABNORMAL LOW (ref 36.0–46.0)
Hemoglobin: 10.1 g/dL — ABNORMAL LOW (ref 12.0–15.0)
Immature Granulocytes: 0 %
Lymphocytes Relative: 25 %
Lymphs Abs: 1.9 10*3/uL (ref 0.7–4.0)
MCH: 30.8 pg (ref 26.0–34.0)
MCHC: 33.2 g/dL (ref 30.0–36.0)
MCV: 92.7 fL (ref 80.0–100.0)
Monocytes Absolute: 0.9 10*3/uL (ref 0.1–1.0)
Monocytes Relative: 12 %
Neutro Abs: 4.2 10*3/uL (ref 1.7–7.7)
Neutrophils Relative %: 57 %
Platelet Count: 184 10*3/uL (ref 150–400)
RBC: 3.28 MIL/uL — ABNORMAL LOW (ref 3.87–5.11)
RDW: 14.6 % (ref 11.5–15.5)
WBC Count: 7.3 10*3/uL (ref 4.0–10.5)
nRBC: 0 % (ref 0.0–0.2)

## 2022-05-10 LAB — CMP (CANCER CENTER ONLY)
ALT: 8 U/L (ref 0–44)
AST: 21 U/L (ref 15–41)
Albumin: 3.5 g/dL (ref 3.5–5.0)
Alkaline Phosphatase: 123 U/L (ref 38–126)
Anion gap: 6 (ref 5–15)
BUN: 16 mg/dL (ref 8–23)
CO2: 26 mmol/L (ref 22–32)
Calcium: 9 mg/dL (ref 8.9–10.3)
Chloride: 108 mmol/L (ref 98–111)
Creatinine: 1.34 mg/dL — ABNORMAL HIGH (ref 0.44–1.00)
GFR, Estimated: 44 mL/min — ABNORMAL LOW (ref >60)
Glucose, Bld: 78 mg/dL (ref 70–99)
Potassium: 3.8 mmol/L (ref 3.5–5.1)
Sodium: 140 mmol/L (ref 135–145)
Total Bilirubin: 0.4 mg/dL (ref 0.3–1.2)
Total Protein: 6.1 g/dL — ABNORMAL LOW (ref 6.5–8.1)

## 2022-05-10 LAB — MAGNESIUM: Magnesium: 1.5 mg/dL — ABNORMAL LOW (ref 1.7–2.4)

## 2022-05-10 LAB — SAMPLE TO BLOOD BANK

## 2022-05-10 MED ORDER — HEPARIN SOD (PORK) LOCK FLUSH 100 UNIT/ML IV SOLN
500.0000 [IU] | Freq: Once | INTRAVENOUS | Status: AC
  Administered 2022-05-10: 500 [IU]

## 2022-05-10 MED ORDER — SODIUM CHLORIDE 0.9% FLUSH
10.0000 mL | Freq: Once | INTRAVENOUS | Status: AC
  Administered 2022-05-10: 10 mL

## 2022-05-10 NOTE — Telephone Encounter (Signed)
I attempted to call the patient. Unable to reach her. I was calling to encourage her to hydrate and make sure she is taking her magnesium supplement. I will send her a mychart message as well. Unable to reach her.

## 2022-05-10 NOTE — Telephone Encounter (Signed)
CRITICAL VALUE STICKER  CRITICAL VALUE:  HGB 10.1  RECEIVER (on-site recipient of call): Real Cona P. LPN  DATE & TIME NOTIFIED: 05/10/2022 1:38 pm  MESSENGER (representative from lab): Lauren     MD NOTIFIED: Dr. Julien Nordmann  TIME OF NOTIFICATION: 1:40 pm

## 2022-05-17 ENCOUNTER — Encounter: Payer: Self-pay | Admitting: Internal Medicine

## 2022-05-31 ENCOUNTER — Encounter: Payer: Self-pay | Admitting: Internal Medicine

## 2022-06-02 ENCOUNTER — Encounter: Payer: Self-pay | Admitting: Medical Oncology

## 2022-06-06 ENCOUNTER — Encounter: Payer: Self-pay | Admitting: Medical Oncology

## 2022-06-06 ENCOUNTER — Other Ambulatory Visit: Payer: Self-pay | Admitting: Internal Medicine

## 2022-06-08 NOTE — Telephone Encounter (Signed)
-----   Message from [name], NP sent at 06/07/2022 10:53 AM EDT ----- Regarding: FW: swelling in legs Sue Chase,   Can we get this patient scheduled for an office visit to discuss edema?   Thanks  Kandee Keen  ----- Message ----- From: Charma Igo, RN Sent: 06/06/2022   3:42 PM EDT To: [name], NP Subject: swelling in legs                               Swelling- Please see the communication thred in the chart from 04/09. Can your staff f/u with pt ?  Dr Arbutus Ped said she may need to see you.  Thanks, Diane

## 2022-06-09 ENCOUNTER — Encounter: Payer: Self-pay | Admitting: Adult Health

## 2022-06-09 ENCOUNTER — Ambulatory Visit (INDEPENDENT_AMBULATORY_CARE_PROVIDER_SITE_OTHER): Payer: Medicare Other | Admitting: Adult Health

## 2022-06-09 VITALS — BP 110/70 | HR 91 | Temp 97.9°F | Ht 62.0 in | Wt 95.0 lb

## 2022-06-09 DIAGNOSIS — R600 Localized edema: Secondary | ICD-10-CM | POA: Diagnosis not present

## 2022-06-09 MED ORDER — FUROSEMIDE 40 MG PO TABS
40.0000 mg | ORAL_TABLET | Freq: Every day | ORAL | 2 refills | Status: DC | PRN
Start: 2022-06-09 — End: 2023-05-29

## 2022-06-09 NOTE — Progress Notes (Signed)
Subjective:    Patient ID: Sue Chase, female    DOB: [birth year], 67 y.o.   MRN: [id number]  HPI 67 year old female who  has a past medical history of Colon polyps, Complication of anesthesia, Hyperlipidemia, Rheumatic fever, Seasonal allergies, and UTI (urinary tract infection).  She is following up today for lower extremity edema. She expressed concern for her oncologist about having lower extremity edema in both legs x 2 months and she was advised to follow up with her PCP.    Edema is better in the morning but progresses during the day. She is elevating her legs while resting and this seems ot help to some deree.   She did take 6 days of Lasix 20 mg that she had left over from an appointment in September 2023 when I prescribed them to her for lower extremity edema( this edema resolved)    Review of Systems See HPI   Past Medical History:  Diagnosis Date   Colon polyps    Complication of anesthesia    tolerated propofol but other meds made her emotional / cry/ difficulty breathing/ panic   Hyperlipidemia    Rheumatic fever    Seasonal allergies    UTI (urinary tract infection)     Social History   Socioeconomic History   Marital status: Widowed    Spouse name: Not on file   Number of children: Not on file   Years of education: Not on file   Highest education level: Some college, no degree  Occupational History   Not on file  Tobacco Use   Smoking status: Some Days    Packs/day: 1.00    Years: 40.00    Additional pack years: 0.00    Total pack years: 40.00    Types: Cigarettes    Passive exposure: Never   Smokeless tobacco: Never   Tobacco comments:    1/2 ppd or less 06/15/21. Hsm/.   Vaping Use   Vaping Use: Never used  Substance and Sexual Activity   Alcohol use: Yes    Comment: Rum and Coke/Unsure of amount   Drug use: Never   Sexual activity: Not on file  Other Topics Concern   Not on file  Social History Narrative   Married    Two grown  children    She enjoys reading, walking   Social Determinants of Health   Financial Resource Strain: Medium Risk (10/18/2021)   Overall Financial Resource Strain (CARDIA)    Difficulty of Paying Living Expenses: Somewhat hard  Food Insecurity: No Food Insecurity (04/10/2022)   Hunger Vital Sign    Worried About Running Out of Food in the Last Year: Never true    Ran Out of Food in the Last Year: Never true  Transportation Needs: No Transportation Needs (04/10/2022)   PRAPARE - Administrator, Civil Service (Medical): No    Lack of Transportation (Non-Medical): No  Physical Activity: Unknown (10/18/2021)   Exercise Vital Sign    Days of Exercise per Week: Patient declined    Minutes of Exercise per Session: Not on file  Stress: No Stress Concern Present (10/18/2021)   [employer] of Occupational Health - Occupational Stress Questionnaire    Feeling of Stress : Only a little  Social Connections: Unknown (10/18/2021)   Social Connection and Isolation Panel [NHANES]    Frequency of Communication with Friends and Family: Three times a week    Frequency of Social Gatherings with Friends and Family: Patient  declined    Attends Religious Services: Patient declined    Active Member of Clubs or Organizations: No    Attends Banker Meetings: Not on file    Marital Status: Widowed  Intimate Partner Violence: Not At Risk (04/10/2022)   Humiliation, Afraid, Rape, and Kick questionnaire    Fear of Current or Ex-Partner: No    Emotionally Abused: No    Physically Abused: No    Sexually Abused: No    Past Surgical History:  Procedure Laterality Date   BRONCHIAL BIOPSY  06/08/2021   Procedure: BRONCHIAL BIOPSIES;  Surgeon: Josephine Igo, DO;  Location: [facility];  Service: Pulmonary;;   BRONCHIAL BRUSHINGS  06/08/2021   Procedure: BRONCHIAL BRUSHINGS;  Surgeon: Josephine Igo, DO;  Location: [facility];  Service: Pulmonary;;   BRONCHIAL NEEDLE ASPIRATION  BIOPSY  06/08/2021   Procedure: BRONCHIAL NEEDLE ASPIRATION BIOPSIES;  Surgeon: Josephine Igo, DO;  Location: [facility];  Service: Pulmonary;;   COLONOSCOPY  2018   ERCP N/A 11/05/2021   Procedure: ENDOSCOPIC RETROGRADE CHOLANGIOPANCREATOGRAPHY (ERCP);  Surgeon: Hilarie Fredrickson, MD;  Location: [facility];  Service: Gastroenterology;  Laterality: N/A;   IR IMAGING GUIDED PORT INSERTION  08/12/2021   REMOVAL OF STONES  11/05/2021   Procedure: REMOVAL OF STONES;  Surgeon: Hilarie Fredrickson, MD;  Location: [facility];  Service: Gastroenterology;;   SALIVARY GLAND SURGERY     LATE 80S   SPHINCTEROTOMY  11/05/2021   Procedure: SPHINCTEROTOMY;  Surgeon: Hilarie Fredrickson, MD;  Location: [facility];  Service: Gastroenterology;;   TUBAL LIGATION  1986   VIDEO BRONCHOSCOPY WITH RADIAL ENDOBRONCHIAL ULTRASOUND  06/08/2021   Procedure: VIDEO BRONCHOSCOPY WITH RADIAL ENDOBRONCHIAL ULTRASOUND;  Surgeon: Josephine Igo, DO;  Location: [facility];  Service: Pulmonary;;    Family History  Problem Relation Age of Onset   Arthritis Mother    Diabetes Mother    Heart disease Mother    Hyperlipidemia Mother    Hypertension Mother    Miscarriages / [place] Mother    Hearing loss Father    Liver cancer Father    Lung cancer Father    Hyperlipidemia Father    Diabetes Brother    Lung disease Maternal Grandfather        Black Lung   Diabetes Paternal Grandmother    Cancer Paternal Grandfather    Diabetes Paternal Grandfather     Allergies  Allergen Reactions   Codeine Itching    Current Outpatient Medications on File Prior to Visit  Medication Sig Dispense Refill   acetaminophen (TYLENOL) 500 MG tablet Take 500 mg by mouth every 6 (six) hours as needed for moderate pain.     omeprazole (PRILOSEC) 20 MG capsule Take 1 capsule (20 mg total) by mouth daily. 30 capsule 3   potassium chloride SA (KLOR-CON M) 20 MEQ tablet Take 20 mEq by mouth daily.     adagrasib (KRAZATI) 200 MG tablet Take 3  tablets (600 mg total) by mouth 2 (two) times daily. (Patient not taking: Reported on 06/09/2022) 180 tablet 3   albuterol (VENTOLIN HFA) 108 (90 Base) MCG/ACT inhaler Inhale 2 puffs into the lungs every 6 (six) hours as needed for wheezing or shortness of breath. 8 g 5   Budeson-Glycopyrrol-Formoterol (BREZTRI AEROSPHERE) 160-9-4.8 MCG/ACT AERO Inhale 2 puffs into the lungs in the morning and at bedtime. 10.7 g 5   ELIQUIS 2.5 MG TABS tablet TAKE 1 TABLET BY MOUTH TWO TIMES A DAY. START START THIS TREATMENT  ONCE YOU RECIEVE THE TARGETED THERAPY WITH KRAZATI (ADAGRASIB). CONTINUE THE OTHER ELIQUIS 5 MG TWICE A DAY. 60 tablet 2   FeFum-FePoly-FA-B Cmp-C-Biot (INTEGRA PLUS) CAPS Take 1 capsule by mouth every morning. (Patient not taking: Reported on 06/09/2022) 30 capsule 2   lidocaine-prilocaine (EMLA) cream Apply to the Port-A-Cath site 30 minutes before chemotherapy (Patient taking differently: Apply 1 Application topically daily as needed. Apply to the Port-A-Cath site 30 minutes before chemotherapy) 30 g 0   megestrol (MEGACE) 20 MG tablet Take 1 tablet (20 mg total) by mouth daily. (Patient not taking: Reported on 06/09/2022) 30 tablet 2   ondansetron (ZOFRAN-ODT) 4 MG disintegrating tablet Dissolve 1 tablet (4 mg total) by mouth every 8 (eight) hours as needed for nausea or vomiting. (Patient not taking: Reported on 06/09/2022) 20 tablet 0   prochlorperazine (COMPAZINE) 10 MG tablet Take 1 tablet (10 mg total) by mouth every 6 (six) hours as needed for nausea or vomiting. (Patient not taking: Reported on 06/09/2022) 30 tablet 0   tiZANidine (ZANAFLEX) 2 MG tablet Take 1 tablet (2 mg total) by mouth every 8 (eight) hours as needed for muscle spasms. (Patient not taking: Reported on 06/09/2022) 30 tablet 2   No current facility-administered medications on file prior to visit.    BP 110/70   Pulse 91   Temp 97.9 F (36.6 C) (Oral)   Ht  (1.575 m)   Wt 95 lb (43.1 kg)   SpO2 95%   BMI 17.38  kg/m       Objective:   Physical Exam Vitals and nursing note reviewed.  Constitutional:      Appearance: Normal appearance.  Cardiovascular:     Rate and Rhythm: Normal rate and regular rhythm.     Pulses: Normal pulses.     Heart sounds: Normal heart sounds.  Pulmonary:     Effort: Pulmonary effort is normal.     Breath sounds: Normal breath sounds.  Musculoskeletal:     Right lower leg: 2+ Pitting Edema present.     Left lower leg: 2+ Pitting Edema present.  Skin:    General: Skin is warm and dry.  Neurological:     General: No focal deficit present.     Mental Status: She is alert and oriented to person, place, and time.       Assessment & Plan:  1. Lower extremity edema - Will increase her dose of Lasix. Will have her take it daily until swelling has resolved. Take potassium with this medication  - Follow up if edema not resolved in the next two weeks - She has labs next with oncology  - furosemide (LASIX) 40 MG tablet; Take 1 tablet (40 mg total) by mouth daily as needed for edema.  Dispense: 30 tablet; Refill: 2  [name], NP

## 2022-06-10 ENCOUNTER — Telehealth: Payer: Self-pay | Admitting: Physician Assistant

## 2022-06-10 NOTE — Telephone Encounter (Signed)
Rescheduled 05/01 appointment time due to scheduling conflict, patient has been called and voicemail was left.

## 2022-06-17 ENCOUNTER — Telehealth: Payer: Self-pay

## 2022-06-17 ENCOUNTER — Inpatient Hospital Stay: Payer: Medicare Other | Attending: Oncology

## 2022-06-17 ENCOUNTER — Other Ambulatory Visit: Payer: Self-pay | Admitting: Physician Assistant

## 2022-06-17 ENCOUNTER — Ambulatory Visit (HOSPITAL_COMMUNITY)
Admission: RE | Admit: 2022-06-17 | Discharge: 2022-06-17 | Disposition: A | Discharge status: Home / Self Care | Payer: Medicare Other | Source: Ambulatory Visit | Attending: Physician Assistant | Admitting: Physician Assistant

## 2022-06-17 DIAGNOSIS — I700 Atherosclerosis of aorta: Secondary | ICD-10-CM | POA: Insufficient documentation

## 2022-06-17 DIAGNOSIS — C3492 Malignant neoplasm of unspecified part of left bronchus or lung: Secondary | ICD-10-CM | POA: Diagnosis not present

## 2022-06-17 DIAGNOSIS — I71011 Dissection of aortic arch: Secondary | ICD-10-CM | POA: Insufficient documentation

## 2022-06-17 DIAGNOSIS — E876 Hypokalemia: Secondary | ICD-10-CM

## 2022-06-17 DIAGNOSIS — Z95828 Presence of other vascular implants and grafts: Secondary | ICD-10-CM

## 2022-06-17 DIAGNOSIS — J439 Emphysema, unspecified: Secondary | ICD-10-CM | POA: Insufficient documentation

## 2022-06-17 DIAGNOSIS — C3491 Malignant neoplasm of unspecified part of right bronchus or lung: Secondary | ICD-10-CM | POA: Insufficient documentation

## 2022-06-17 LAB — CMP (CANCER CENTER ONLY)
ALT: 5 U/L (ref 0–44)
AST: 16 U/L (ref 15–41)
Albumin: 3.6 g/dL (ref 3.5–5.0)
Alkaline Phosphatase: 86 U/L (ref 38–126)
Anion gap: 6 (ref 5–15)
BUN: 14 mg/dL (ref 8–23)
CO2: 29 mmol/L (ref 22–32)
Calcium: 9.6 mg/dL (ref 8.9–10.3)
Chloride: 103 mmol/L (ref 98–111)
Creatinine: 1.38 mg/dL — ABNORMAL HIGH (ref 0.44–1.00)
GFR, Estimated: 42 mL/min — ABNORMAL LOW (ref >60)
Glucose, Bld: 84 mg/dL (ref 70–99)
Potassium: 3.1 mmol/L — ABNORMAL LOW (ref 3.5–5.1)
Sodium: 138 mmol/L (ref 135–145)
Total Bilirubin: 0.3 mg/dL (ref 0.3–1.2)
Total Protein: 6.6 g/dL (ref 6.5–8.1)

## 2022-06-17 LAB — CBC WITH DIFFERENTIAL (CANCER CENTER ONLY)
Abs Immature Granulocytes: 0.01 10*3/uL (ref 0.00–0.07)
Basophils Absolute: 0 10*3/uL (ref 0.0–0.1)
Basophils Relative: 0 %
Eosinophils Absolute: 0.4 10*3/uL (ref 0.0–0.5)
Eosinophils Relative: 6 %
HCT: 32.7 % — ABNORMAL LOW (ref 36.0–46.0)
Hemoglobin: 10.9 g/dL — ABNORMAL LOW (ref 12.0–15.0)
Immature Granulocytes: 0 %
Lymphocytes Relative: 31 %
Lymphs Abs: 2.4 10*3/uL (ref 0.7–4.0)
MCH: 29.5 pg (ref 26.0–34.0)
MCHC: 33.3 g/dL (ref 30.0–36.0)
MCV: 88.4 fL (ref 80.0–100.0)
Monocytes Absolute: 1 10*3/uL (ref 0.1–1.0)
Monocytes Relative: 13 %
Neutro Abs: 3.9 10*3/uL (ref 1.7–7.7)
Neutrophils Relative %: 50 %
Platelet Count: 220 10*3/uL (ref 150–400)
RBC: 3.7 MIL/uL — ABNORMAL LOW (ref 3.87–5.11)
RDW: 13.3 % (ref 11.5–15.5)
WBC Count: 7.7 10*3/uL (ref 4.0–10.5)
nRBC: 0 % (ref 0.0–0.2)

## 2022-06-17 LAB — SAMPLE TO BLOOD BANK

## 2022-06-17 LAB — MAGNESIUM: Magnesium: 1.6 mg/dL — ABNORMAL LOW (ref 1.7–2.4)

## 2022-06-17 MED ORDER — IOHEXOL 300 MG/ML  SOLN
100.0000 mL | Freq: Once | INTRAMUSCULAR | Status: AC | PRN
  Administered 2022-06-17: 80 mL via INTRAVENOUS

## 2022-06-17 MED ORDER — SODIUM CHLORIDE 0.9% FLUSH
10.0000 mL | Freq: Once | INTRAVENOUS | Status: AC
  Administered 2022-06-17: 10 mL

## 2022-06-17 MED ORDER — POTASSIUM CHLORIDE CRYS ER 20 MEQ PO TBCR
20.0000 meq | EXTENDED_RELEASE_TABLET | Freq: Every day | ORAL | 0 refills | Status: DC
Start: 2022-06-17 — End: 2023-11-30

## 2022-06-17 MED ORDER — HEPARIN SOD (PORK) LOCK FLUSH 100 UNIT/ML IV SOLN
500.0000 [IU] | Freq: Once | INTRAVENOUS | Status: AC
  Administered 2022-06-17: 500 [IU] via INTRAVENOUS

## 2022-06-17 NOTE — Telephone Encounter (Signed)
CRITICAL VALUE STICKER  CRITICAL VALUE: HGB 10.9  RECEIVER (on-site recipient of call): Verniece Encarnacion P. LPN   DATE & TIME NOTIFIED: 06/17/22 10:25 am   MESSENGER (representative from lab): Shanda Bumps  MD NOTIFIED: C. Heilingoetter, PA-C  RESPONSE: We will continue to monitor.  No transfusion needed at this time.

## 2022-06-19 NOTE — Progress Notes (Signed)
[facility] OFFICE PROGRESS NOTE  [name], NP [address] [place] [address]  DIAGNOSIS:  Stage IV (T2a, N0, M1a) non-small cell lung cancer, adenocarcinoma presented with multifocal bilateral pulmonary nodules involving the right upper lobe as well as the smaller bilateral groundglass opacities diagnosed in April 2023.  PD-L1 expression is 4%.   Molecular studies by [employer] tissue test showed positive KRAS G12C mutation but the blood test failed secondary to insufficient circulating tumor DNA.  PRIOR THERAPY: Systemic chemotherapy with carboplatin for AUC of 5, Alimta 500 Mg/M2 and Keytruda 200 Mg IV every 3 weeks.  First dose 08/10/2021.  Status post 8 cycles.  Starting from cycle #5 the patient is on maintenance treatment with Alimta and Keytruda every 3 weeks.  This treatment was discontinued secondary to disease progression     CURRENT THERAPY: Krazati (Adagrasib) 600 mg p.o. twice daily.  First dose started 03/08/2022. Status post about 1 month of treatment. Will reduce dose to 400 mg BID starting from today 04/05/22 due to intolerance. Treatment currently on hold starting from 04/15/22 due to intolerance.    INTERVAL HISTORY: Sue Chase 67 y.o. female returns  to the clinic today for a follow-up visit accompanied by her daughter.  The patient was last seen by myself and Dr. Arbutus Ped on 04/26/22. In summary, the patient was recently found to have evidence of disease progression in January 2024.  Therefore, she was then switched to treatment with targeted treatment with Caryn Section. She had intolerance (weakness, weight loss, poor appetite, nausea, vomiting, and electrolyte derangements)  to this despite dose reductions and her treatment has been on hold since 04/15/22. She has a restaging CT scan on *** which was ***. Therefore, her treament has been on hold and she is presently on observation.   Since last being seen, she is feeling ***. She continues to  have swelling for which she saw her PCP who ***.  Lasix pills do not agree with her due to causing dizziness/lightheadedness.  She was instructed to purchase compression stockings.  Since last being seen her swelling has *** She has a hard time getting the compressions on due to some numbness in her hands.  She was also encouraged to increase her protein supplement use.  She was previously put on Megace for her appetite but she has only taken this for 1 dose.  She still has not resumed Megace because she does not feel that she needs it at this time.   Numbness in the hands??? Calcium low. She also struggled with low magnesium and potassium whic she is taking ***  Otherwise, denies any new complaints today.  She denies any fever, chills, night sweats. She takes her magnesium supplement when she is able but she has not been able to take this daily.  She states that she takes half a tablet at a time sometimes.  She has required IV magnesium and potassium several times over the course the last few weeks.  Since she was on steroids last week, that helped her appetite a little bit.  She denies any significant nausea or vomiting at this time. She reports that she does not have any significant diarrhea since last week but she knows to take Imodium if needed.  The patient recently had a restaging CT scan performed.  She is here today for evaluation, repeat blood work, and to review her scan results and discuss the next steps in her care.   MEDICAL HISTORY: Past Medical  History:  Diagnosis Date   Colon polyps    Complication of anesthesia    tolerated propofol but other meds made her emotional / cry/ difficulty breathing/ panic   Hyperlipidemia    Rheumatic fever    Seasonal allergies    UTI (urinary tract infection)     ALLERGIES:  is allergic to codeine.  MEDICATIONS:  Current Outpatient Medications  Medication Sig Dispense Refill   acetaminophen (TYLENOL) 500 MG tablet Take 500 mg by mouth every 6  (six) hours as needed for moderate pain.     adagrasib (KRAZATI) 200 MG tablet Take 3 tablets (600 mg total) by mouth 2 (two) times daily. (Patient not taking: Reported on 06/09/2022) 180 tablet 3   albuterol (VENTOLIN HFA) 108 (90 Base) MCG/ACT inhaler Inhale 2 puffs into the lungs every 6 (six) hours as needed for wheezing or shortness of breath. 8 g 5   Budeson-Glycopyrrol-Formoterol (BREZTRI AEROSPHERE) 160-9-4.8 MCG/ACT AERO Inhale 2 puffs into the lungs in the morning and at bedtime. 10.7 g 5   ELIQUIS 2.5 MG TABS tablet TAKE 1 TABLET BY MOUTH TWO TIMES A DAY. START START THIS TREATMENT ONCE YOU RECIEVE THE TARGETED THERAPY WITH KRAZATI (ADAGRASIB). CONTINUE THE OTHER ELIQUIS 5 MG TWICE A DAY. 60 tablet 2   FeFum-FePoly-FA-B Cmp-C-Biot (INTEGRA PLUS) CAPS Take 1 capsule by mouth every morning. (Patient not taking: Reported on 06/09/2022) 30 capsule 2   furosemide (LASIX) 40 MG tablet Take 1 tablet (40 mg total) by mouth daily as needed for edema. 30 tablet 2   lidocaine-prilocaine (EMLA) cream Apply to the Port-A-Cath site 30 minutes before chemotherapy (Patient taking differently: Apply 1 Application topically daily as needed. Apply to the Port-A-Cath site 30 minutes before chemotherapy) 30 g 0   megestrol (MEGACE) 20 MG tablet Take 1 tablet (20 mg total) by mouth daily. (Patient not taking: Reported on 06/09/2022) 30 tablet 2   omeprazole (PRILOSEC) 20 MG capsule Take 1 capsule (20 mg total) by mouth daily. 30 capsule 3   ondansetron (ZOFRAN-ODT) 4 MG disintegrating tablet Dissolve 1 tablet (4 mg total) by mouth every 8 (eight) hours as needed for nausea or vomiting. (Patient not taking: Reported on 06/09/2022) 20 tablet 0   potassium chloride SA (KLOR-CON M) 20 MEQ tablet Take 1 tablet (20 mEq total) by mouth daily. 10 tablet 0   prochlorperazine (COMPAZINE) 10 MG tablet Take 1 tablet (10 mg total) by mouth every 6 (six) hours as needed for nausea or vomiting. (Patient not taking: Reported on  06/09/2022) 30 tablet 0   tiZANidine (ZANAFLEX) 2 MG tablet Take 1 tablet (2 mg total) by mouth every 8 (eight) hours as needed for muscle spasms. (Patient not taking: Reported on 06/09/2022) 30 tablet 2   No current facility-administered medications for this visit.    SURGICAL HISTORY:  Past Surgical History:  Procedure Laterality Date   BRONCHIAL BIOPSY  06/08/2021   Procedure: BRONCHIAL BIOPSIES;  Surgeon: Josephine Igo, DO;  Location: [facility];  Service: Pulmonary;;   BRONCHIAL BRUSHINGS  06/08/2021   Procedure: BRONCHIAL BRUSHINGS;  Surgeon: Josephine Igo, DO;  Location: [facility];  Service: Pulmonary;;   BRONCHIAL NEEDLE ASPIRATION BIOPSY  06/08/2021   Procedure: BRONCHIAL NEEDLE ASPIRATION BIOPSIES;  Surgeon: Josephine Igo, DO;  Location: [facility];  Service: Pulmonary;;   COLONOSCOPY  2018   ERCP N/A 11/05/2021   Procedure: ENDOSCOPIC RETROGRADE CHOLANGIOPANCREATOGRAPHY (ERCP);  Surgeon: Hilarie Fredrickson, MD;  Location: [facility];  Service: Gastroenterology;  Laterality: N/A;  IR IMAGING GUIDED PORT INSERTION  08/12/2021   REMOVAL OF STONES  11/05/2021   Procedure: REMOVAL OF STONES;  Surgeon: Hilarie Fredrickson, MD;  Location: [facility];  Service: Gastroenterology;;   SALIVARY GLAND SURGERY     LATE 80S   SPHINCTEROTOMY  11/05/2021   Procedure: SPHINCTEROTOMY;  Surgeon: Hilarie Fredrickson, MD;  Location: [facility];  Service: Gastroenterology;;   TUBAL LIGATION  1986   VIDEO BRONCHOSCOPY WITH RADIAL ENDOBRONCHIAL ULTRASOUND  06/08/2021   Procedure: VIDEO BRONCHOSCOPY WITH RADIAL ENDOBRONCHIAL ULTRASOUND;  Surgeon: Josephine Igo, DO;  Location: [facility];  Service: Pulmonary;;    REVIEW OF SYSTEMS:   Review of Systems  Constitutional: Negative for appetite change, chills, fatigue, fever and unexpected weight change.  HENT:   Negative for mouth sores, nosebleeds, sore throat and trouble swallowing.   Eyes: Negative for eye problems and icterus.  Respiratory:  Negative for cough, hemoptysis, shortness of breath and wheezing.   Cardiovascular: Negative for chest pain and leg swelling.  Gastrointestinal: Negative for abdominal pain, constipation, diarrhea, nausea and vomiting.  Genitourinary: Negative for bladder incontinence, difficulty urinating, dysuria, frequency and hematuria.   Musculoskeletal: Negative for back pain, gait problem, neck pain and neck stiffness.  Skin: Negative for itching and rash.  Neurological: Negative for dizziness, extremity weakness, gait problem, headaches, light-headedness and seizures.  Hematological: Negative for adenopathy. Does not bruise/bleed easily.  Psychiatric/Behavioral: Negative for confusion, depression and sleep disturbance. The patient is not nervous/anxious.     PHYSICAL EXAMINATION:  There were no vitals taken for this visit.  ECOG PERFORMANCE STATUS: {CHL ONC ECOG [id number]  Physical Exam  Constitutional: Oriented to person, place, and time and well-developed, well-nourished, and in no distress. No distress.  HENT:  Head: Normocephalic and atraumatic.  Mouth/Throat: Oropharynx is clear and moist. No oropharyngeal exudate.  Eyes: Conjunctivae are normal. Right eye exhibits no discharge. Left eye exhibits no discharge. No scleral icterus.  Neck: Normal range of motion. Neck supple.  Cardiovascular: Normal rate, regular rhythm, normal heart sounds and intact distal pulses.   Pulmonary/Chest: Effort normal and breath sounds normal. No respiratory distress. No wheezes. No rales.  Abdominal: Soft. Bowel sounds are normal. Exhibits no distension and no mass. There is no tenderness.  Musculoskeletal: Normal range of motion. Exhibits no edema.  Lymphadenopathy:    No cervical adenopathy.  Neurological: Alert and oriented to person, place, and time. Exhibits normal muscle tone. Gait normal. Coordination normal.  Skin: Skin is warm and dry. No rash noted. Not diaphoretic. No erythema. No pallor.   Psychiatric: Mood, memory and judgment normal.  Vitals reviewed.  LABORATORY DATA: Lab Results  Component Value Date   WBC 7.7 06/17/2022   HGB 10.9 (L) 06/17/2022   HCT 32.7 (L) 06/17/2022   MCV 88.4 06/17/2022   PLT 220 06/17/2022      Chemistry      Component Value Date/Time   NA 138 06/17/2022 0953   K 3.1 (L) 06/17/2022 0953   CL 103 06/17/2022 0953   CO2 29 06/17/2022 0953   BUN 14 06/17/2022 0953   CREATININE 1.38 (H) 06/17/2022 0953      Component Value Date/Time   CALCIUM 9.6 06/17/2022 0953   ALKPHOS 86 06/17/2022 0953   AST 16 06/17/2022 0953   ALT 5 06/17/2022 0953   BILITOT 0.3 06/17/2022 0953       RADIOGRAPHIC STUDIES:  No results found.   ASSESSMENT/PLAN:  This very pleasant 67 year old Caucasian female diagnosed with stage IV (T2  a, N0, M1 a) non-small cell lung cancer, adenocarcinoma.  The patient presented multifocal bilateral pulmonary nodules involving the right upper lobe as well as small bilateral groundglass opacities.  She was diagnosed in April 2023.  She is positive for a K-ras G12 C mutation and her PD-L1 expression of 4%.  Her K-ras G12 C mutation can be targeted in the second line setting in the future.    The patient is currently undergoing systemic chemotherapy with carboplatin for an AUC of 5, Alimta 500 mg per metered square, Keytruda 200 mg IV every 3 weeks.  She is status post 8 cycles.  She tolerates this fairly well. Starting from cycle #5, she started maintenance alimta and [place].    In early January the patient had a repeat CT scan that showed possible progression with increased size of the nodules in the posterior right upper lobe as well as increase in size of the innumerable bilateral pulmonary nodules.   Therefore, Dr. Arbutus Ped discontinued her treatment Alimta Keytruda due to the disease progression.  She was then started on Krazati 600 mg p.o. twice daily on 03/08/2022.  She tolerated this well for the first 2 weeks but  then she developed nausea, vomiting, and diarrhea for which she was seen in the symptom management clinic several times for IV fluids and electrolyte replacement.  She has also went to the ER for similar symptoms. She was seen on 04/05/2022 her dose of Caryn Section was reduced to 400 mg twice daily.  Her treatment has been on hold since 04/14/2022 due to intolerance. She had a restaging CT scan which showed some improvement in her disease, therefore, she was given a 2 month break from treatment  She recently had another resting CT scan performed. The patient was seen with Dr. Arbutus Ped today. Dr. Arbutus Ped personally and independently reviewed the scan and discussed the results with the patient. The scan showed ***. Recommend that she ***   We will see her back for a follow up visit in *** months for evaluation and to review her scan results.   Magnesium and potassium? Swelling***  Flush  Tylenol and muscle relaxers for tightness in the diaphragm.   The patient was advised to call immediately if she has any concerning symptoms in the interval. The patient voices understanding of current disease status and treatment options and is in agreement with the current care plan. All questions were answered. The patient knows to call the clinic with any problems, questions or concerns. We can certainly see the patient much sooner if necessary   No orders of the defined types were placed in this encounter.    I spent {CHL ONC TIME VISIT - NFAOZ:[id number]} counseling the patient face to face. The total time spent in the appointment was {CHL ONC TIME VISIT - GEXBM:[id number]}.  Malajah Oceguera L Bradyn Soward, PA-C 06/19/22

## 2022-06-22 ENCOUNTER — Ambulatory Visit: Payer: Medicare Other | Admitting: Physician Assistant

## 2022-06-22 ENCOUNTER — Inpatient Hospital Stay: Payer: Medicare Other | Attending: Oncology | Admitting: Physician Assistant

## 2022-06-22 VITALS — BP 135/84 | HR 66 | Temp 97.5°F | Resp 13 | Wt 95.6 lb

## 2022-06-22 DIAGNOSIS — C3412 Malignant neoplasm of upper lobe, left bronchus or lung: Secondary | ICD-10-CM | POA: Insufficient documentation

## 2022-06-22 DIAGNOSIS — R630 Anorexia: Secondary | ICD-10-CM | POA: Insufficient documentation

## 2022-06-22 DIAGNOSIS — R600 Localized edema: Secondary | ICD-10-CM | POA: Insufficient documentation

## 2022-06-22 DIAGNOSIS — R0609 Other forms of dyspnea: Secondary | ICD-10-CM | POA: Insufficient documentation

## 2022-06-22 DIAGNOSIS — L299 Pruritus, unspecified: Secondary | ICD-10-CM | POA: Diagnosis not present

## 2022-06-22 DIAGNOSIS — E876 Hypokalemia: Secondary | ICD-10-CM | POA: Diagnosis not present

## 2022-06-22 DIAGNOSIS — Z9221 Personal history of antineoplastic chemotherapy: Secondary | ICD-10-CM | POA: Diagnosis not present

## 2022-06-22 DIAGNOSIS — Z7901 Long term (current) use of anticoagulants: Secondary | ICD-10-CM | POA: Insufficient documentation

## 2022-06-22 DIAGNOSIS — C3491 Malignant neoplasm of unspecified part of right bronchus or lung: Secondary | ICD-10-CM | POA: Diagnosis not present

## 2022-06-22 MED ORDER — HYDROXYZINE HCL 10 MG PO TABS
10.0000 mg | ORAL_TABLET | Freq: Three times a day (TID) | ORAL | 0 refills | Status: DC | PRN
Start: 2022-06-22 — End: 2023-05-29

## 2022-06-22 MED ORDER — APIXABAN 5 MG PO TABS
5.0000 mg | ORAL_TABLET | Freq: Two times a day (BID) | ORAL | 2 refills | Status: DC
Start: 2022-06-22 — End: 2022-10-21

## 2022-06-27 ENCOUNTER — Telehealth: Payer: Self-pay | Admitting: Adult Health

## 2022-06-27 NOTE — Telephone Encounter (Signed)
Contacted [place] Sheer to schedule their annual wellness visit. Welcome to Medicare visit Due by 10/23/22.  Rudell Cobb AWV direct phone # [phone]   WTM before 10/23/22 per [employer]

## 2022-07-12 ENCOUNTER — Telehealth: Payer: Self-pay | Admitting: Adult Health

## 2022-07-12 NOTE — Telephone Encounter (Signed)
Noted  

## 2022-07-12 NOTE — Telephone Encounter (Signed)
Patient dropped off document Handicap Placard, to be filled out by provider. Patient requested to send it via Call Patient to pick up within 5-days. Document is located in providers tray at front office.Please advise at Mobile [phone] (mobile)

## 2022-07-13 NOTE — Telephone Encounter (Signed)
PPW filled out and placed in front office filing cabinet. Lm for pt to return call.

## 2022-07-13 NOTE — Telephone Encounter (Signed)
PPW was picked up and given to pt by front office staff.

## 2022-07-13 NOTE — Telephone Encounter (Signed)
Pt returned CMA's phone call.  Pt will come in to pick up PPW.

## 2022-08-04 ENCOUNTER — Inpatient Hospital Stay: Payer: Medicare Other | Attending: Oncology

## 2022-08-04 ENCOUNTER — Encounter: Payer: Self-pay | Admitting: Physician Assistant

## 2022-08-04 DIAGNOSIS — C3411 Malignant neoplasm of upper lobe, right bronchus or lung: Secondary | ICD-10-CM | POA: Diagnosis present

## 2022-08-04 DIAGNOSIS — R197 Diarrhea, unspecified: Secondary | ICD-10-CM

## 2022-08-04 DIAGNOSIS — Z95828 Presence of other vascular implants and grafts: Secondary | ICD-10-CM

## 2022-08-04 DIAGNOSIS — C3491 Malignant neoplasm of unspecified part of right bronchus or lung: Secondary | ICD-10-CM

## 2022-08-04 LAB — CMP (CANCER CENTER ONLY)
ALT: 6 U/L (ref 0–44)
AST: 14 U/L — ABNORMAL LOW (ref 15–41)
Albumin: 3.8 g/dL (ref 3.5–5.0)
Alkaline Phosphatase: 80 U/L (ref 38–126)
Anion gap: 7 (ref 5–15)
BUN: 22 mg/dL (ref 8–23)
CO2: 27 mmol/L (ref 22–32)
Calcium: 9.4 mg/dL (ref 8.9–10.3)
Chloride: 103 mmol/L (ref 98–111)
Creatinine: 1.21 mg/dL — ABNORMAL HIGH (ref 0.44–1.00)
GFR, Estimated: 49 mL/min — ABNORMAL LOW (ref >60)
Glucose, Bld: 86 mg/dL (ref 70–99)
Potassium: 3.7 mmol/L (ref 3.5–5.1)
Sodium: 137 mmol/L (ref 135–145)
Total Bilirubin: 0.4 mg/dL (ref 0.3–1.2)
Total Protein: 6.9 g/dL (ref 6.5–8.1)

## 2022-08-04 LAB — MAGNESIUM: Magnesium: 1.5 mg/dL — ABNORMAL LOW (ref 1.7–2.4)

## 2022-08-04 LAB — CBC WITH DIFFERENTIAL (CANCER CENTER ONLY)
Abs Immature Granulocytes: 0.03 10*3/uL (ref 0.00–0.07)
Basophils Absolute: 0 10*3/uL (ref 0.0–0.1)
Basophils Relative: 0 %
Eosinophils Absolute: 0.5 10*3/uL (ref 0.0–0.5)
Eosinophils Relative: 6 %
HCT: 34.3 % — ABNORMAL LOW (ref 36.0–46.0)
Hemoglobin: 11.9 g/dL — ABNORMAL LOW (ref 12.0–15.0)
Immature Granulocytes: 0 %
Lymphocytes Relative: 30 %
Lymphs Abs: 2.6 10*3/uL (ref 0.7–4.0)
MCH: 30.2 pg (ref 26.0–34.0)
MCHC: 34.7 g/dL (ref 30.0–36.0)
MCV: 87.1 fL (ref 80.0–100.0)
Monocytes Absolute: 0.8 10*3/uL (ref 0.1–1.0)
Monocytes Relative: 9 %
Neutro Abs: 4.7 10*3/uL (ref 1.7–7.7)
Neutrophils Relative %: 55 %
Platelet Count: 180 10*3/uL (ref 150–400)
RBC: 3.94 MIL/uL (ref 3.87–5.11)
RDW: 13.1 % (ref 11.5–15.5)
WBC Count: 8.7 10*3/uL (ref 4.0–10.5)
nRBC: 0 % (ref 0.0–0.2)

## 2022-08-04 MED ORDER — HEPARIN SOD (PORK) LOCK FLUSH 100 UNIT/ML IV SOLN
500.0000 [IU] | Freq: Once | INTRAVENOUS | Status: AC
  Administered 2022-08-04: 500 [IU]

## 2022-08-04 MED ORDER — SODIUM CHLORIDE 0.9% FLUSH
10.0000 mL | Freq: Once | INTRAVENOUS | Status: AC
  Administered 2022-08-04: 10 mL

## 2022-09-06 ENCOUNTER — Telehealth: Payer: Self-pay | Admitting: Pulmonary Disease

## 2022-09-06 ENCOUNTER — Encounter: Payer: Self-pay | Admitting: Internal Medicine

## 2022-09-06 NOTE — Telephone Encounter (Signed)
Pt. Calling need samples of Bestriz to make it till he f/u apt please advise pt

## 2022-09-08 MED ORDER — BREZTRI AEROSPHERE 160-9-4.8 MCG/ACT IN AERO: 2.0000 | INHALATION_SPRAY | Freq: Two times a day (BID) | RESPIRATORY_TRACT | Status: DC

## 2022-09-08 NOTE — Telephone Encounter (Signed)
Patient checking on message for Breztri samples. Patient out of medication. Patient phone number is [phone].

## 2022-09-08 NOTE — Telephone Encounter (Signed)
Patient stated she was on her husband's insurance and was receiving Breztri.  Patient stated her husband passed and she no longer has insurance. Patient stated she can not afford Breztri and is trying to get patient assistance through [employer].  Patient is scheduled with Beth,NP 09/13/22 for follow up.  I have placed a months Breztri samples at front for patient pick up.  Nothing further at this time.

## 2022-09-13 ENCOUNTER — Encounter: Payer: Self-pay | Admitting: Primary Care

## 2022-09-13 ENCOUNTER — Ambulatory Visit: Payer: Medicare Other | Admitting: Primary Care

## 2022-09-13 VITALS — BP 92/64 | HR 87 | Temp 97.8°F | Ht 62.0 in | Wt 92.8 lb

## 2022-09-13 DIAGNOSIS — C3491 Malignant neoplasm of unspecified part of right bronchus or lung: Secondary | ICD-10-CM

## 2022-09-13 DIAGNOSIS — Z86711 Personal history of pulmonary embolism: Secondary | ICD-10-CM | POA: Diagnosis not present

## 2022-09-13 MED ORDER — BREZTRI AEROSPHERE 160-9-4.8 MCG/ACT IN AERO: 2.0000 | INHALATION_SPRAY | Freq: Two times a day (BID) | RESPIRATORY_TRACT | Status: DC

## 2022-09-13 NOTE — Assessment & Plan Note (Addendum)
-   Stable; Not acutely exacerbated. She is having difficulty affording medication d/t insurance coverage. She is well controlled on [employer] two puffs twice daily. She was given samples today and provided paperwork for patient assistance through [employer]. Advised patient take mucinex 600mg  1-2 times a day to loosen congestion and look into getting flutter valve.

## 2022-09-13 NOTE — Patient Instructions (Addendum)
Recommendations: - Continue Eliquis as directed - Continue Breztri Aerosphere as directed - samples given and provided patient assistance forms  - Take Mucinex 600mg  1-2 times daily as needed to loosen congestion - Start Loratadine 10mg  daily (over the counter) as needed for allergies/post nasal drip  - Look at getting flutter valve (Acapella device) to loosen congestion (we can order this or you can get on [facility])  Follow-up: - 3 months with Dr. Tonia Brooms (COPD/pulmonary nodules)

## 2022-09-13 NOTE — Assessment & Plan Note (Addendum)
-   Hx DVT/PE dx 02/18/22 - Continue Eliquis 5mg  twice daily

## 2022-09-13 NOTE — Assessment & Plan Note (Signed)
-  Diagnosed with stage IV non-small cell lung cancer, adenocarcinoma in April 2023.  Therapy on hold as of February 2024 tolerance. Currently under observation, scheduled for restaging CT scan of the chest in August with Dr. Arbutus Ped.

## 2022-09-13 NOTE — Progress Notes (Signed)
@Patient  ID: Sue Chase, female    DOB: [birth year], 67 y.o.   MRN: [id number]  Chief Complaint  Patient presents with   Follow-up    Needs patient assistance with [place] for [facility].  No insurance since husband's death.  SOB with hot weather.  Sx worse qam.    Referring provider: [name], NP  HPI: 67 year old female, current someday smoker.  Past medical history significant for COPD, adenocarcinoma, PE/DVT anticoagulation.  Patient of Dr. Tonia Brooms, last seen on 06/15/2021.  09/13/2022 Patient presents today for overdue follow-up/ COPD and adenocarcinoma. Her husband passed last year, she was under his insurance and unable to keep [employer]. She has medicare A&B. She needs to apply for Patient assistance for [facility] ([place]). Her respiratory symptoms are well controlled when on Breztri. Dyspnea is worse with heat and humidity. She has a chronic cough with clear mucus production especially in the morning. She smokes 3-4 cigarettes a week. Diagnosed with stage IV non-small cell lung cancer, adenocarcinoma in April 2023. She is currently undergoing systemic chemotherapy. Currently under observation, scheduled for restaging CT scan of the chest in August.  On Eliquis for hx DVT/PE dx 02/18/22.   Allergies  Allergen Reactions   Codeine Itching    Immunization History  Administered Date(s) Administered   Fluad Quad(high Dose 65+) 02/20/2022   Influenza, Quadrivalent, Recombinant, Inj, Pf 12/15/2017, 11/17/2018   Influenza,inj,Quad PF,6+ Mos 11/27/2019   PFIZER(Purple Top)SARS-COV-2 Vaccination 05/15/2019, 06/05/2019   PNEUMOCOCCAL CONJUGATE-20 02/20/2022   Tdap 01/04/2019    Past Medical History:  Diagnosis Date   Colon polyps    Complication of anesthesia    tolerated propofol but other meds made her emotional / cry/ difficulty breathing/ panic   Hyperlipidemia    Rheumatic fever    Seasonal allergies    UTI (urinary tract infection)     Tobacco History: Social History    Tobacco Use  Smoking Status Some Days   Current packs/day: 1.00   Average packs/day: 1 pack/day for 40.0 years (40.0 ttl pk-yrs)   Types: Cigarettes   Passive exposure: Current  Smokeless Tobacco Never  Tobacco Comments   Patient smokes 3-4 cigarettes a week.  Patient is currently around smokers.  Updated 09/13/2022.   Ready to quit: Not Answered Counseling given: Not Answered Tobacco comments: Patient smokes 3-4 cigarettes a week.  Patient is currently around smokers.  Updated 09/13/2022.   Outpatient Medications Prior to Visit  Medication Sig Dispense Refill   acetaminophen (TYLENOL) 500 MG tablet Take 500 mg by mouth every 6 (six) hours as needed for moderate pain.     albuterol (VENTOLIN HFA) 108 (90 Base) MCG/ACT inhaler Inhale 2 puffs into the lungs every 6 (six) hours as needed for wheezing or shortness of breath. 8 g 5   apixaban (ELIQUIS) 5 MG TABS tablet Take 1 tablet (5 mg total) by mouth 2 (two) times daily. 60 tablet 2   Budeson-Glycopyrrol-Formoterol (BREZTRI AEROSPHERE) 160-9-4.8 MCG/ACT AERO Inhale 2 puffs into the lungs in the morning and at bedtime. 10.7 g 5   potassium chloride SA (KLOR-CON M) 20 MEQ tablet Take 1 tablet (20 mEq total) by mouth daily. 10 tablet 0   tiZANidine (ZANAFLEX) 2 MG tablet Take 1 tablet (2 mg total) by mouth every 8 (eight) hours as needed for muscle spasms. 30 tablet 2   Budeson-Glycopyrrol-Formoterol (BREZTRI AEROSPHERE) 160-9-4.8 MCG/ACT AERO Inhale 2 puffs into the lungs in the morning and at bedtime. (Patient not taking: Reported on 09/13/2022)  FeFum-FePoly-FA-B Cmp-C-Biot (INTEGRA PLUS) CAPS Take 1 capsule by mouth every morning. (Patient not taking: Reported on 09/13/2022) 30 capsule 2   furosemide (LASIX) 40 MG tablet Take 1 tablet (40 mg total) by mouth daily as needed for edema. (Patient not taking: Reported on 06/22/2022) 30 tablet 2   hydrOXYzine (ATARAX) 10 MG tablet Take 1 tablet (10 mg total) by mouth 3 (three) times daily as  needed for itching. (Patient not taking: Reported on 09/13/2022) 60 tablet 0   lidocaine-prilocaine (EMLA) cream Apply to the Port-A-Cath site 30 minutes before chemotherapy (Patient not taking: Reported on 09/13/2022) 30 g 0   megestrol (MEGACE) 20 MG tablet Take 1 tablet (20 mg total) by mouth daily. (Patient not taking: Reported on 06/22/2022) 30 tablet 2   omeprazole (PRILOSEC) 20 MG capsule Take 1 capsule (20 mg total) by mouth daily. (Patient not taking: Reported on 09/13/2022) 30 capsule 3   ondansetron (ZOFRAN-ODT) 4 MG disintegrating tablet Dissolve 1 tablet (4 mg total) by mouth every 8 (eight) hours as needed for nausea or vomiting. (Patient not taking: Reported on 09/13/2022) 20 tablet 0   prochlorperazine (COMPAZINE) 10 MG tablet Take 1 tablet (10 mg total) by mouth every 6 (six) hours as needed for nausea or vomiting. (Patient not taking: Reported on 09/13/2022) 30 tablet 0   No facility-administered medications prior to visit.    Review of Systems  Review of Systems  Constitutional: Negative.   HENT:  Positive for congestion.   Respiratory:  Positive for cough. Negative for chest tightness, shortness of breath and wheezing.   Cardiovascular: Negative.      Physical Exam  BP 92/64 (BP Location: Left Arm, Patient Position: Sitting, Cuff Size: Normal)   Pulse 87   Temp 97.8 F (36.6 C) (Oral)   Ht 5\' 2"  (1.575 m)   Wt 92 lb 12.8 oz (42.1 kg)   SpO2 96%   BMI 16.97 kg/m  Physical Exam Constitutional:      Appearance: Normal appearance.  HENT:     Head: Normocephalic and atraumatic.     Mouth/Throat:     Mouth: Mucous membranes are moist.     Pharynx: Oropharynx is clear.  Cardiovascular:     Rate and Rhythm: Normal rate and regular rhythm.  Pulmonary:     Effort: Pulmonary effort is normal.     Breath sounds: Normal breath sounds. No wheezing or rales.     Comments: Scattered dull rhonchi R>L Musculoskeletal:        General: Normal range of motion.  Skin:     General: Skin is warm and dry.  Neurological:     General: No focal deficit present.     Mental Status: She is alert and oriented to person, place, and time. Mental status is at baseline.  Psychiatric:        Mood and Affect: Mood normal.        Behavior: Behavior normal.        Thought Content: Thought content normal.        Judgment: Judgment normal.      Lab Results:  CBC    Component Value Date/Time   WBC 8.7 08/04/2022 1338   WBC 5.4 04/10/2022 1252   RBC 3.94 08/04/2022 1338   HGB 11.9 (L) 08/04/2022 1338   HCT 34.3 (L) 08/04/2022 1338   PLT 180 08/04/2022 1338   MCV 87.1 08/04/2022 1338   MCH 30.2 08/04/2022 1338   MCHC 34.7 08/04/2022 1338   RDW 13.1 08/04/2022  1338   LYMPHSABS 2.6 08/04/2022 1338   MONOABS 0.8 08/04/2022 1338   EOSABS 0.5 08/04/2022 1338   BASOSABS 0.0 08/04/2022 1338    BMET    Component Value Date/Time   NA 137 08/04/2022 1338   K 3.7 08/04/2022 1338   CL 103 08/04/2022 1338   CO2 27 08/04/2022 1338   GLUCOSE 86 08/04/2022 1338   BUN 22 08/04/2022 1338   CREATININE 1.21 (H) 08/04/2022 1338   CALCIUM 9.4 08/04/2022 1338   GFRNONAA 49 (L) 08/04/2022 1338   GFRAA >60 09/23/2019 1031    BNP    Component Value Date/Time   BNP 85.5 02/17/2022 1935    ProBNP    Component Value Date/Time   PROBNP 26.0 06/29/2017 1002    Imaging: No results found.   Assessment & Plan:   COPD (chronic obstructive pulmonary disease) (HCC) - Stable; Not acutely exacerbated. She is having difficulty affording medication d/t insurance coverage. She is well controlled on [employer] two puffs twice daily. She was given samples today and provided paperwork for patient assistance through [place]. Advised patient take mucinex 600mg  1-2 times a day to loosen congestion and look into getting flutter valve.   Adenocarcinoma of right lung, stage 4 (HCC) -Diagnosed with stage IV non-small cell lung cancer, adenocarcinoma in April 2023.  Therapy on hold  as of February 2024 tolerance. Currently under observation, scheduled for restaging CT scan of the chest in August with Dr. Arbutus Ped.   History of pulmonary embolism - Hx DVT/PE dx 02/18/22 - Continue Eliquis 5mg  twice daily   FU routine visit in 3 months with Dr. Lacey Jensen, NP 09/13/2022

## 2022-09-22 ENCOUNTER — Other Ambulatory Visit: Payer: Self-pay

## 2022-09-22 ENCOUNTER — Inpatient Hospital Stay: Payer: Medicare HMO | Attending: Oncology

## 2022-09-22 ENCOUNTER — Encounter: Payer: Self-pay | Admitting: Internal Medicine

## 2022-09-22 ENCOUNTER — Encounter (HOSPITAL_COMMUNITY): Payer: Self-pay

## 2022-09-22 ENCOUNTER — Ambulatory Visit (HOSPITAL_COMMUNITY)
Admission: RE | Admit: 2022-09-22 | Discharge: 2022-09-22 | Disposition: A | Discharge status: Home / Self Care | Payer: Medicare HMO | Source: Ambulatory Visit | Attending: Physician Assistant | Admitting: Physician Assistant

## 2022-09-22 DIAGNOSIS — K5730 Diverticulosis of large intestine without perforation or abscess without bleeding: Secondary | ICD-10-CM | POA: Diagnosis not present

## 2022-09-22 DIAGNOSIS — C3491 Malignant neoplasm of unspecified part of right bronchus or lung: Secondary | ICD-10-CM | POA: Insufficient documentation

## 2022-09-22 DIAGNOSIS — C3411 Malignant neoplasm of upper lobe, right bronchus or lung: Secondary | ICD-10-CM | POA: Insufficient documentation

## 2022-09-22 DIAGNOSIS — C7800 Secondary malignant neoplasm of unspecified lung: Secondary | ICD-10-CM | POA: Diagnosis not present

## 2022-09-22 DIAGNOSIS — R918 Other nonspecific abnormal finding of lung field: Secondary | ICD-10-CM | POA: Diagnosis not present

## 2022-09-22 DIAGNOSIS — L989 Disorder of the skin and subcutaneous tissue, unspecified: Secondary | ICD-10-CM | POA: Insufficient documentation

## 2022-09-22 DIAGNOSIS — J432 Centrilobular emphysema: Secondary | ICD-10-CM | POA: Diagnosis not present

## 2022-09-22 DIAGNOSIS — Z95828 Presence of other vascular implants and grafts: Secondary | ICD-10-CM

## 2022-09-22 LAB — CBC WITH DIFFERENTIAL (CANCER CENTER ONLY)
Abs Immature Granulocytes: 0.02 10*3/uL (ref 0.00–0.07)
Basophils Absolute: 0.1 10*3/uL (ref 0.0–0.1)
Basophils Relative: 1 %
Eosinophils Absolute: 1.3 10*3/uL — ABNORMAL HIGH (ref 0.0–0.5)
Eosinophils Relative: 15 %
HCT: 33.9 % — ABNORMAL LOW (ref 36.0–46.0)
Hemoglobin: 11.6 g/dL — ABNORMAL LOW (ref 12.0–15.0)
Immature Granulocytes: 0 %
Lymphocytes Relative: 30 %
Lymphs Abs: 2.6 10*3/uL (ref 0.7–4.0)
MCH: 29.7 pg (ref 26.0–34.0)
MCHC: 34.2 g/dL (ref 30.0–36.0)
MCV: 86.7 fL (ref 80.0–100.0)
Monocytes Absolute: 0.8 10*3/uL (ref 0.1–1.0)
Monocytes Relative: 9 %
Neutro Abs: 4 10*3/uL (ref 1.7–7.7)
Neutrophils Relative %: 45 %
Platelet Count: 225 10*3/uL (ref 150–400)
RBC: 3.91 MIL/uL (ref 3.87–5.11)
RDW: 13.6 % (ref 11.5–15.5)
WBC Count: 8.8 10*3/uL (ref 4.0–10.5)
nRBC: 0 % (ref 0.0–0.2)

## 2022-09-22 LAB — CMP (CANCER CENTER ONLY)
ALT: 5 U/L (ref 0–44)
AST: 12 U/L — ABNORMAL LOW (ref 15–41)
Albumin: 4.1 g/dL (ref 3.5–5.0)
Alkaline Phosphatase: 80 U/L (ref 38–126)
Anion gap: 8 (ref 5–15)
BUN: 25 mg/dL — ABNORMAL HIGH (ref 8–23)
CO2: 25 mmol/L (ref 22–32)
Calcium: 9.3 mg/dL (ref 8.9–10.3)
Chloride: 105 mmol/L (ref 98–111)
Creatinine: 1.38 mg/dL — ABNORMAL HIGH (ref 0.44–1.00)
GFR, Estimated: 42 mL/min — ABNORMAL LOW (ref >60)
Glucose, Bld: 83 mg/dL (ref 70–99)
Potassium: 3.8 mmol/L (ref 3.5–5.1)
Sodium: 138 mmol/L (ref 135–145)
Total Bilirubin: 0.3 mg/dL (ref 0.3–1.2)
Total Protein: 7 g/dL (ref 6.5–8.1)

## 2022-09-22 LAB — MAGNESIUM: Magnesium: 1.7 mg/dL (ref 1.7–2.4)

## 2022-09-22 MED ORDER — SODIUM CHLORIDE 0.9% FLUSH
10.0000 mL | Freq: Once | INTRAVENOUS | Status: AC
  Administered 2022-09-22: 10 mL

## 2022-09-22 MED ORDER — IOHEXOL 9 MG/ML PO SOLN
ORAL | Status: AC
  Filled 2022-09-22: qty 1000

## 2022-09-22 MED ORDER — HEPARIN SOD (PORK) LOCK FLUSH 100 UNIT/ML IV SOLN
INTRAVENOUS | Status: AC
  Filled 2022-09-22: qty 5

## 2022-09-22 MED ORDER — IOHEXOL 300 MG/ML  SOLN
75.0000 mL | Freq: Once | INTRAMUSCULAR | Status: AC | PRN
  Administered 2022-09-22: 75 mL via INTRAVENOUS

## 2022-09-22 MED ORDER — SODIUM CHLORIDE (PF) 0.9 % IJ SOLN
INTRAMUSCULAR | Status: AC
  Filled 2022-09-22: qty 50

## 2022-09-22 MED ORDER — HEPARIN SOD (PORK) LOCK FLUSH 100 UNIT/ML IV SOLN: 500.0000 [IU] | Freq: Once | INTRAVENOUS | Status: DC

## 2022-09-22 MED ORDER — IOHEXOL 9 MG/ML PO SOLN
1000.0000 mL | Freq: Once | ORAL | Status: AC
  Administered 2022-09-22: 1000 mL via ORAL

## 2022-09-23 NOTE — Progress Notes (Unsigned)
[name] OFFICE PROGRESS NOTE  [name], NP [address] [place] [address]  DIAGNOSIS:  Stage IV (T2a, N0, M1a) non-small cell lung cancer, adenocarcinoma presented with multifocal bilateral pulmonary nodules involving the right upper lobe as well as the smaller bilateral groundglass opacities diagnosed in April 2023.  PD-L1 expression is 4%.   Molecular studies by [employer] tissue test showed positive KRAS G12C mutation but the blood test failed secondary to insufficient circulating tumor DNA.  PRIOR THERAPY: Systemic chemotherapy with carboplatin for AUC of 5, Alimta 500 Mg/M2 and Keytruda 200 Mg IV every 3 weeks.  First dose 08/10/2021.  Status post 8 cycles.  Starting from cycle #5 the patient is on maintenance treatment with Alimta and Keytruda every 3 weeks.  This treatment was discontinued secondary to disease progression     CURRENT THERAPY:  Observation, was on Krazati (Adagrasib) 600 mg p.o. twice daily.  First dose started 03/08/2022. Status post about 1 month of treatment. We reduced dose to 400 mg BID starting from today 04/05/22 due to intolerance. Treatment currently on hold starting from 04/15/22 due to intolerance.    INTERVAL HISTORY: Sue Chase 67 y.o. female returns to the clinic today for a follow-up visit. The patient was last seen by myself and Dr. Arbutus Ped on 06/22/2022.  In summary, the patient was found to have evidence of disease progression in January 2024.  Therefore, she was then started on targeted treatment with [place]. She had intolerance (weakness, weight loss, poor appetite, nausea, vomiting, and electrolyte derangements)  to this despite dose reductions and her treatment has been on hold since 04/15/22. She has a restaging CT scan on 04/22/22 which actually showed an improvement in several of the lung nodules.  Her scan in May 2024 also did not show any evidence of disease progression.  Therefore, her treament has been on  hold and she is presently on observation.   Of course, if the patient has progression on future imaging studies she would have to discuss resuming treatment.  Overall, the patient is feeling fair today.  She does continue to have baseline fatigue but is overall better than when she was on treatment several months ago.  She mentions that she has recently changed over insurance.  She mentions that her Eliquis is now quite expensive with her co-pay.  She also mentions several months ago she had dry itchy skin and then her skin subsequently peeled.  After her skin peel, she no longer had issues with pruritus.  Does have a skin lesion on her left forearm that has been there for 1 to 2 months.  It is slightly erythematous, raised, and nodular in appearance.  She reports it is not getting any smaller.  No drainage or ulcerations.  He no longer is having lower extremity swelling.  She reports her appetite is improving but she continues to be cachectic appearing and has not gained any weight.  She is not taking any Megace at this time since her appetite is better.  She denies any fever, chills, or night sweats. She denies any nausea. She reports stable dyspnea on exertion and has inhalers.  She recently saw pulmonary medicine who recommended Mucinex.  She does report that she has a hard time breathing with the hot humid weather but is okay when inside.  She denies any chest pain or hemoptysis.  Denies any vomiting, diarrhea, or constipation.  She recently had a restaging CT scan performed.  She is here  today for evaluation and repeat blood work and for a more detailed discussion about her current condition and recommended treatment options.   MEDICAL HISTORY: Past Medical History:  Diagnosis Date   Cancer [facility])    Colon polyps    Complication of anesthesia    tolerated propofol but other meds made her emotional / cry/ difficulty breathing/ panic   Hyperlipidemia    Rheumatic fever    Seasonal allergies     UTI (urinary tract infection)     ALLERGIES:  is allergic to codeine.  MEDICATIONS:  Current Outpatient Medications  Medication Sig Dispense Refill   acetaminophen (TYLENOL) 500 MG tablet Take 500 mg by mouth every 6 (six) hours as needed for moderate pain.     albuterol (VENTOLIN HFA) 108 (90 Base) MCG/ACT inhaler Inhale 2 puffs into the lungs every 6 (six) hours as needed for wheezing or shortness of breath. 8 g 5   apixaban (ELIQUIS) 5 MG TABS tablet Take 1 tablet (5 mg total) by mouth 2 (two) times daily. 60 tablet 2   Budeson-Glycopyrrol-Formoterol (BREZTRI AEROSPHERE) 160-9-4.8 MCG/ACT AERO Inhale 2 puffs into the lungs in the morning and at bedtime. 10.7 g 5   Budeson-Glycopyrrol-Formoterol (BREZTRI AEROSPHERE) 160-9-4.8 MCG/ACT AERO Inhale 2 puffs into the lungs in the morning and at bedtime. (Patient not taking: Reported on 09/13/2022)     Budeson-Glycopyrrol-Formoterol (BREZTRI AEROSPHERE) 160-9-4.8 MCG/ACT AERO Inhale 2 puffs into the lungs in the morning and at bedtime.     Budeson-Glycopyrrol-Formoterol (BREZTRI AEROSPHERE) 160-9-4.8 MCG/ACT AERO Inhale 2 puffs into the lungs in the morning and at bedtime.     FeFum-FePoly-FA-B Cmp-C-Biot (INTEGRA PLUS) CAPS Take 1 capsule by mouth every morning. (Patient not taking: Reported on 09/13/2022) 30 capsule 2   furosemide (LASIX) 40 MG tablet Take 1 tablet (40 mg total) by mouth daily as needed for edema. (Patient not taking: Reported on 06/22/2022) 30 tablet 2   hydrOXYzine (ATARAX) 10 MG tablet Take 1 tablet (10 mg total) by mouth 3 (three) times daily as needed for itching. (Patient not taking: Reported on 09/13/2022) 60 tablet 0   lidocaine-prilocaine (EMLA) cream Apply to the Port-A-Cath site 30 minutes before chemotherapy (Patient not taking: Reported on 09/13/2022) 30 g 0   megestrol (MEGACE) 20 MG tablet Take 1 tablet (20 mg total) by mouth daily. (Patient not taking: Reported on 06/22/2022) 30 tablet 2   omeprazole (PRILOSEC) 20 MG  capsule Take 1 capsule (20 mg total) by mouth daily. (Patient not taking: Reported on 09/13/2022) 30 capsule 3   ondansetron (ZOFRAN-ODT) 4 MG disintegrating tablet Dissolve 1 tablet (4 mg total) by mouth every 8 (eight) hours as needed for nausea or vomiting. (Patient not taking: Reported on 09/13/2022) 20 tablet 0   potassium chloride SA (KLOR-CON M) 20 MEQ tablet Take 1 tablet (20 mEq total) by mouth daily. 10 tablet 0   prochlorperazine (COMPAZINE) 10 MG tablet Take 1 tablet (10 mg total) by mouth every 6 (six) hours as needed for nausea or vomiting. (Patient not taking: Reported on 09/13/2022) 30 tablet 0   tiZANidine (ZANAFLEX) 2 MG tablet Take 1 tablet (2 mg total) by mouth every 8 (eight) hours as needed for muscle spasms. 30 tablet 2   No current facility-administered medications for this visit.    SURGICAL HISTORY:  Past Surgical History:  Procedure Laterality Date   BRONCHIAL BIOPSY  06/08/2021   Procedure: BRONCHIAL BIOPSIES;  Surgeon: Josephine Igo, DO;  Location: [facility];  Service: Pulmonary;;  BRONCHIAL BRUSHINGS  06/08/2021   Procedure: BRONCHIAL BRUSHINGS;  Surgeon: Josephine Igo, DO;  Location: [facility];  Service: Pulmonary;;   BRONCHIAL NEEDLE ASPIRATION BIOPSY  06/08/2021   Procedure: BRONCHIAL NEEDLE ASPIRATION BIOPSIES;  Surgeon: Josephine Igo, DO;  Location: [facility];  Service: Pulmonary;;   COLONOSCOPY  2018   ERCP N/A 11/05/2021   Procedure: ENDOSCOPIC RETROGRADE CHOLANGIOPANCREATOGRAPHY (ERCP);  Surgeon: Hilarie Fredrickson, MD;  Location: [facility];  Service: Gastroenterology;  Laterality: N/A;   IR IMAGING GUIDED PORT INSERTION  08/12/2021   REMOVAL OF STONES  11/05/2021   Procedure: REMOVAL OF STONES;  Surgeon: Hilarie Fredrickson, MD;  Location: [facility];  Service: Gastroenterology;;   SALIVARY GLAND SURGERY     LATE 80S   SPHINCTEROTOMY  11/05/2021   Procedure: SPHINCTEROTOMY;  Surgeon: Hilarie Fredrickson, MD;  Location: [facility];  Service:  Gastroenterology;;   TUBAL LIGATION  1986   VIDEO BRONCHOSCOPY WITH RADIAL ENDOBRONCHIAL ULTRASOUND  06/08/2021   Procedure: VIDEO BRONCHOSCOPY WITH RADIAL ENDOBRONCHIAL ULTRASOUND;  Surgeon: Josephine Igo, DO;  Location: [facility];  Service: Pulmonary;;    REVIEW OF SYSTEMS:   Review of Systems  Constitutional: Positive for stable fatigue. Negative for appetite change, chills,  fever and unexpected weight change.  HENT: Negative for mouth sores, nosebleeds, sore throat and trouble swallowing.   Eyes: Negative for eye problems and icterus.  Respiratory: Stable dyspnea on exertion and mild cough. Negative for  hemoptysis and wheezing.   Cardiovascular: Negative for chest pain and leg swelling.  Gastrointestinal: Negative for abdominal pain, constipation, diarrhea, nausea and vomiting.  Genitourinary: Negative for bladder incontinence, difficulty urinating, dysuria, frequency and hematuria.   Musculoskeletal: Negative for back pain, gait problem, neck pain and neck stiffness.  Skin: Positive for nodular skin lesion on left forearm.  Negative for itching and rash.  Neurological: Negative for dizziness, extremity weakness, gait problem, headaches, light-headedness and seizures.  Hematological: Negative for adenopathy. Does not bruise/bleed easily.  Psychiatric/Behavioral: Negative for confusion, depression and sleep disturbance. The patient is not nervous/anxious.     PHYSICAL EXAMINATION:  Blood pressure 124/67, pulse 68, temperature 97.9 F (36.6 C), temperature source Oral, resp. rate 14, height 5\' 2"  (1.575 m), weight 94 lb 1.6 oz (42.7 kg), SpO2 100%.  ECOG PERFORMANCE STATUS: 1  Physical Exam  Constitutional: Oriented to person, place, and time and cachectic appearing female and in no distress.  HENT:  Head: Normocephalic and atraumatic.  Mouth/Throat: Oropharynx is clear and moist. No oropharyngeal exudate.  Eyes: Conjunctivae are normal. Right eye exhibits no discharge.  Left eye exhibits no discharge. No scleral icterus.  Neck: Normal range of motion. Neck supple.  Cardiovascular: Normal rate, regular rhythm, normal heart sounds and intact distal pulses.   Pulmonary/Chest: Effort normal. Quiet breath sounds. No respiratory distress. No wheezes. No rales.  Abdominal: Soft. Bowel sounds are normal. Exhibits no distension and no mass. There is no tenderness.  Musculoskeletal: Normal range of motion.  Improved bilateral lower extremity edema. Lymphadenopathy:    No cervical adenopathy.  Neurological: Alert and oriented to person, place, and time. Exhibits muscle wasting.  Nation and gait normal.  Skin: Skin is warm and dry. No rash noted. Not diaphoretic. No erythema. No pallor.  Psychiatric: Mood, memory and judgment normal.  Vitals reviewed.  LABORATORY DATA: Lab Results  Component Value Date   WBC 8.8 09/22/2022   HGB 11.6 (L) 09/22/2022   HCT 33.9 (L) 09/22/2022   MCV 86.7 09/22/2022   PLT  225 09/22/2022      Chemistry      Component Value Date/Time   NA 138 09/22/2022 0955   K 3.8 09/22/2022 0955   CL 105 09/22/2022 0955   CO2 25 09/22/2022 0955   BUN 25 (H) 09/22/2022 0955   CREATININE 1.38 (H) 09/22/2022 0955      Component Value Date/Time   CALCIUM 9.3 09/22/2022 0955   ALKPHOS 80 09/22/2022 0955   AST 12 (L) 09/22/2022 0955   ALT 5 09/22/2022 0955   BILITOT 0.3 09/22/2022 0955       RADIOGRAPHIC STUDIES:  CT Chest W Contrast  Result Date: 09/26/2022 CLINICAL DATA:  Metastatic lung cancer restaging * Tracking Code: BO * EXAM: CT CHEST, ABDOMEN, AND PELVIS WITH CONTRAST TECHNIQUE: Multidetector CT imaging of the chest, abdomen and pelvis was performed following the standard protocol during bolus administration of intravenous contrast. RADIATION DOSE REDUCTION: This exam was performed according to the departmental dose-optimization program which includes automated exposure control, adjustment of the mA and/or kV according to patient  size and/or use of iterative reconstruction technique. CONTRAST:  75mL OMNIPAQUE IOHEXOL 300 MG/ML SOLN additional oral enteric contrast COMPARISON:  06/19/2022 FINDINGS: CT CHEST FINDINGS Cardiovascular: Right chest port catheter. Aortic atherosclerosis. 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 Heart size is normal. Left coronary artery calcifications. Mediastinum/Nodes: No enlarged mediastinal, hilar, or axillary lymph nodes. Thyroid gland, trachea, and esophagus demonstrate no significant findings. Lungs/Pleura: Mild centrilobular emphysema. Unchanged, bland scarring of the lung bases. Numerous ground-glass and subsolid nodules are again seen throughout the lungs, not significantly changed. Largest subsolid nodule of the posterior right upper lobe is unchanged at 3.0 x 2.6 cm, again with tenting of the adjacent major fissure (series 4, image 44). Additional spiculated nodule of the anterior right lower lobe abutting and tenting the major fissure is unchanged at 0.7 x 0.6 cm (series 4, image 115). Additional spiculated solid nodule of the posterior left upper lobe unchanged 0.5 cm (series 4, image 31). Numerous additional ground-glass and subsolid nodules. No pleural effusion or pneumothorax. Musculoskeletal: No chest wall abnormality. No acute osseous findings. CT ABDOMEN PELVIS FINDINGS Hepatobiliary: No solid liver abnormality is seen. No gallstones, gallbladder wall thickening, or biliary dilatation. Pancreas: Unremarkable. No pancreatic ductal dilatation or surrounding inflammatory changes. Spleen: Normal in size without significant abnormality. Adrenals/Urinary Tract: Adrenal glands are unremarkable. Kidneys are normal, without renal calculi, solid lesion, or hydronephrosis. Bladder is unremarkable. Stomach/Bowel: Stomach is within normal limits. Appendix appears normal. No evidence of bowel wall thickening, distention,  or inflammatory changes. Descending and sigmoid diverticulosis. Vascular/Lymphatic: Aortic atherosclerosis. Unchanged prominent subcentimeter retroperitoneal lymph nodes (series 2, image 65). Reproductive: No mass or other abnormality. Other: No abdominal wall hernia or abnormality. No ascites. Musculoskeletal: No acute osseous findings. Unchanged mild wedge and endplate deformities of T9, T10, L1, and L3. IMPRESSION: 1. Numerous ground-glass and subsolid nodules are again seen throughout the lungs, not significantly changed. Largest subsolid nodule of the posterior right upper lobe is unchanged at 3.0 x 2.6 cm, again with tenting of the adjacent major fissure. Additional small spiculated solid nodules likewise unchanged. 2. Unchanged prominent subcentimeter retroperitoneal lymph nodes. Attention on follow-up. 3. 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 Attention on follow-up. 4. Emphysema. 5. Coronary artery disease. Aortic Atherosclerosis (ICD10-I70.0) and Emphysema (ICD10-J43.9). Electronically Signed   By: Jearld Lesch M.D.   On:  09/26/2022 09:06   CT Abdomen Pelvis W Contrast  Result Date: 09/26/2022 CLINICAL DATA:  Metastatic lung cancer restaging * Tracking Code: BO * EXAM: CT CHEST, ABDOMEN, AND PELVIS WITH CONTRAST TECHNIQUE: Multidetector CT imaging of the chest, abdomen and pelvis was performed following the standard protocol during bolus administration of intravenous contrast. RADIATION DOSE REDUCTION: This exam was performed according to the departmental dose-optimization program which includes automated exposure control, adjustment of the mA and/or kV according to patient size and/or use of iterative reconstruction technique. CONTRAST:  75mL OMNIPAQUE IOHEXOL 300 MG/ML SOLN additional oral enteric contrast COMPARISON:  06/19/2022 FINDINGS: CT CHEST FINDINGS Cardiovascular: Right chest port catheter. Aortic atherosclerosis.  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 Heart size is normal. Left coronary artery calcifications. Mediastinum/Nodes: No enlarged mediastinal, hilar, or axillary lymph nodes. Thyroid gland, trachea, and esophagus demonstrate no significant findings. Lungs/Pleura: Mild centrilobular emphysema. Unchanged, bland scarring of the lung bases. Numerous ground-glass and subsolid nodules are again seen throughout the lungs, not significantly changed. Largest subsolid nodule of the posterior right upper lobe is unchanged at 3.0 x 2.6 cm, again with tenting of the adjacent major fissure (series 4, image 44). Additional spiculated nodule of the anterior right lower lobe abutting and tenting the major fissure is unchanged at 0.7 x 0.6 cm (series 4, image 115). Additional spiculated solid nodule of the posterior left upper lobe unchanged 0.5 cm (series 4, image 31). Numerous additional ground-glass and subsolid nodules. No pleural effusion or pneumothorax. Musculoskeletal: No chest wall abnormality. No acute osseous findings. CT ABDOMEN PELVIS FINDINGS Hepatobiliary: No solid liver abnormality is seen. No gallstones, gallbladder wall thickening, or biliary dilatation. Pancreas: Unremarkable. No pancreatic ductal dilatation or surrounding inflammatory changes. Spleen: Normal in size without significant abnormality. Adrenals/Urinary Tract: Adrenal glands are unremarkable. Kidneys are normal, without renal calculi, solid lesion, or hydronephrosis. Bladder is unremarkable. Stomach/Bowel: Stomach is within normal limits. Appendix appears normal. No evidence of bowel wall thickening, distention, or inflammatory changes. Descending and sigmoid diverticulosis. Vascular/Lymphatic: Aortic atherosclerosis. Unchanged prominent subcentimeter retroperitoneal lymph nodes (series 2, image 65). Reproductive: No mass or other abnormality. Other: No abdominal  wall hernia or abnormality. No ascites. Musculoskeletal: No acute osseous findings. Unchanged mild wedge and endplate deformities of T9, T10, L1, and L3. IMPRESSION: 1. Numerous ground-glass and subsolid nodules are again seen throughout the lungs, not significantly changed. Largest subsolid nodule of the posterior right upper lobe is unchanged at 3.0 x 2.6 cm, again with tenting of the adjacent major fissure. Additional small spiculated solid nodules likewise unchanged. 2. Unchanged prominent subcentimeter retroperitoneal lymph nodes. Attention on follow-up. 3. 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 Attention on follow-up. 4. Emphysema. 5. Coronary artery disease. Aortic Atherosclerosis (ICD10-I70.0) and Emphysema (ICD10-J43.9). Electronically Signed   By: Jearld Lesch M.D.   On: 09/26/2022 09:06     ASSESSMENT/PLAN:  This very pleasant 67 year old Caucasian female diagnosed with stage IV (T2 a, N0, M1 a) non-small cell lung cancer, adenocarcinoma.  The patient presented multifocal bilateral pulmonary nodules involving the right upper lobe as well as small bilateral groundglass opacities.  She was diagnosed in April 2023.  She is positive for a K-ras G12 C mutation and her PD-L1 expression of 4%.  Her K-ras G12 C mutation can be targeted in the second line setting in the future.    The patient is currently undergoing systemic  chemotherapy with carboplatin for an AUC of 5, Alimta 500 mg per metered square, Keytruda 200 mg IV every 3 weeks.  She is status post 8 cycles.  She tolerates this fairly well. Starting from cycle #5, she started maintenance alimta and [place].   In early January the patient had a repeat CT scan that showed possible progression with increased size of the nodules in the posterior right upper lobe as well as increase in size of the innumerable bilateral pulmonary nodules.   Therefore, Dr. Arbutus Ped discontinued her  treatment Alimta Keytruda due to the disease progression.   She was then started on Krazati 600 mg p.o. twice daily on 03/08/2022.  She tolerated this well for the first 2 weeks but then she developed nausea, vomiting, and diarrhea for which she was seen in the symptom management clinic several times for IV fluids and electrolyte replacement.  She has also went to the ER for similar symptoms. She was seen on 04/05/2022 her dose of Caryn Section was reduced to 400 mg twice daily.  Her treatment has been on hold since 04/14/2022 due to intolerance. She had a restaging CT scan which showed some improvement in her disease, therefore, she was given a break from treatment.   She recently had another resting CT scan performed. The patient was seen with Dr. Arbutus Ped today. Dr. Arbutus Ped personally and independently reviewed the scan and discussed the results with the patient. The scan showed no evidence of disease progression. Recommend that we continue on observation with close monitoring. Of course, in the future if/when she has disease progression, we will have to discuss options for restarting treatment.   For now, she will continue on observation with a repeat CT scan in 3 months.   We will arrange for port flush in 6 to 8 weeks with repeat lab work.   I placed referral for her skin lesion per patient request .   I encouraged her to apply for the patient assistance form for Eliquis. Let her know the application is on the Eliquis website  Dr. Arbutus Ped strongly encouraged her to increase her caloric intake and try to gain weight, should she need treatment in the future to build up a reserve.    The patient was advised to call immediately if she has any concerning symptoms in the interval. The patient voices understanding of current disease status and treatment options and is in agreement with the current care plan. All questions were answered. The patient knows to call the clinic with any problems, questions or  concerns. We can certainly see the patient much sooner if necessary      Orders Placed This Encounter  Procedures   CT CHEST ABDOMEN PELVIS W CONTRAST    Standing Status:   Future    Standing Expiration Date:   09/26/2023    Order Specific Question:   If indicated for the ordered procedure, I authorize the administration of contrast media per Radiology protocol    Answer:   Yes    Order Specific Question:   Does the patient have a contrast media/X-ray dye allergy?    Answer:   No    Order Specific Question:   Preferred imaging location?    Answer:   [facility]    Order Specific Question:   If indicated for the ordered procedure, I authorize the administration of oral contrast media per Radiology protocol    Answer:   Yes   CBC with Differential (Cancer Center Only)  Standing Status:   Future    Standing Expiration Date:   09/26/2023   Magnesium    Standing Status:   Future    Standing Expiration Date:   09/26/2023   CMP (Cancer Center only)    Standing Status:   Future    Standing Expiration Date:   09/26/2023   Ambulatory referral to Dermatology    Referral Priority:   Routine    Referral Type:   Consultation    Referral Reason:   Specialty Services Required    Requested Specialty:   Dermatology    Number of Visits Requested:   1      L , PA-C 09/26/22  ADDENDUM: Hematology/Oncology Attending: I had a distal face encounter with the patient today.  I reviewed her records, lab, scan and recommended her care plan.  This is a very pleasant 67 years old white female with a stage IV non-small cell lung cancer, adenocarcinoma diagnosed in April 2023 was positive KRAS G12C mutation and PD-L1 expression of 4%.  She is status post systemic chemotherapy initially with carboplatin, Alimta and Keytruda for 4 cycles followed by 4 cycles of maintenance treatment with Alimta and Keytruda but this was discontinued secondary to disease progression.  The patient then  started treatment with Caryn Section (Adagrasib) initially at 600 mg p.o. twice daily for 1 months then her dose was reduced to 400 mg p.o. twice daily but again this was discontinued secondary to intolerance and she has been on observation since that time. She is here today with no concerning complaints except for mild fatigue. She had repeat CT scan of the chest, abdomen and pelvis performed recently.  I personally and independently reviewed the scan and discussed the result with the patient today. Her scan showed no concerning findings for disease progression. I recommended for her to continue on observation with repeat CT scan of the chest, abdomen and pelvis in 3 months. She was advised to call immediately if she has any other concerning symptoms in the interval. The total time spent in the appointment was 30 minutes. Disclaimer: This note was dictated with voice recognition software. Similar sounding words can inadvertently be transcribed and may be missed upon review. Lajuana Matte, MD

## 2022-09-26 ENCOUNTER — Inpatient Hospital Stay: Payer: Medicare HMO | Admitting: Physician Assistant

## 2022-09-26 ENCOUNTER — Other Ambulatory Visit: Payer: Self-pay

## 2022-09-26 VITALS — BP 124/67 | HR 68 | Temp 97.9°F | Resp 14 | Ht 62.0 in | Wt 94.1 lb

## 2022-09-26 DIAGNOSIS — C3411 Malignant neoplasm of upper lobe, right bronchus or lung: Secondary | ICD-10-CM | POA: Diagnosis not present

## 2022-09-26 DIAGNOSIS — C3491 Malignant neoplasm of unspecified part of right bronchus or lung: Secondary | ICD-10-CM | POA: Diagnosis not present

## 2022-09-26 DIAGNOSIS — L989 Disorder of the skin and subcutaneous tissue, unspecified: Secondary | ICD-10-CM | POA: Diagnosis not present

## 2022-10-13 ENCOUNTER — Telehealth: Payer: Self-pay | Admitting: Internal Medicine

## 2022-10-21 ENCOUNTER — Other Ambulatory Visit: Payer: Self-pay | Admitting: Physician Assistant

## 2022-10-21 DIAGNOSIS — C3491 Malignant neoplasm of unspecified part of right bronchus or lung: Secondary | ICD-10-CM

## 2022-11-07 ENCOUNTER — Other Ambulatory Visit: Payer: Self-pay

## 2022-11-07 ENCOUNTER — Inpatient Hospital Stay: Payer: Medicare HMO | Attending: Oncology

## 2022-11-07 DIAGNOSIS — C3411 Malignant neoplasm of upper lobe, right bronchus or lung: Secondary | ICD-10-CM | POA: Insufficient documentation

## 2022-11-07 DIAGNOSIS — Z95828 Presence of other vascular implants and grafts: Secondary | ICD-10-CM

## 2022-11-07 MED ORDER — HEPARIN SOD (PORK) LOCK FLUSH 100 UNIT/ML IV SOLN
500.0000 [IU] | Freq: Once | INTRAVENOUS | Status: AC
  Administered 2022-11-07: 500 [IU]

## 2022-11-07 MED ORDER — SODIUM CHLORIDE 0.9% FLUSH
10.0000 mL | Freq: Once | INTRAVENOUS | Status: AC
  Administered 2022-11-07: 10 mL

## 2022-11-17 ENCOUNTER — Telehealth: Payer: Medicare HMO | Admitting: Family Medicine

## 2022-11-17 DIAGNOSIS — Z0000 Encounter for general adult medical examination without abnormal findings: Secondary | ICD-10-CM

## 2022-11-17 DIAGNOSIS — R051 Acute cough: Secondary | ICD-10-CM

## 2022-11-17 MED ORDER — DOXYCYCLINE HYCLATE 100 MG PO TABS: 100.0000 mg | ORAL_TABLET | Freq: Two times a day (BID) | ORAL | 0 refills | Status: DC

## 2022-11-17 MED ORDER — BENZONATATE 100 MG PO CAPS: ORAL_CAPSULE | ORAL | 0 refills | Status: DC

## 2022-11-17 NOTE — Patient Instructions (Addendum)
I really enjoyed getting to talk with you today! I am available on Tuesdays and Thursdays for virtual visits if you have any questions or concerns, or if I can be of any further assistance.   CHECKLIST FROM ANNUAL WELLNESS VISIT:  -Follow up (please call to schedule if not scheduled after visit):   -yearly for annual wellness visit with primary care office  Here is a list of your preventive care/health maintenance measures and the plan for each if any are due:  PLAN For any measures below that may be due:    Health Maintenance  Topic Date Due   Hepatitis C Screening  Never done   Zoster Vaccines- Shingrix (1 of 2) Never done   MAMMOGRAM  09/16/2011   COVID-19 Vaccine (3 - Pfizer risk series) 07/03/2019   Colonoscopy  09/21/2021   INFLUENZA VACCINE  09/22/2022   Medicare Annual Wellness (AWV)  11/17/2023   DTaP/Tdap/Td (2 - Td or Tdap) 01/03/2029   Pneumonia Vaccine 70+ Years old  Completed   DEXA SCAN  Completed   HPV VACCINES  Aged Out    -See a dentist at least yearly  -Get your eyes checked and then per your eye specialist's recommendations  -Other issues addressed today:   -I have included below further information regarding a healthy whole foods based diet, physical activity guidelines for adults, stress management and opportunities for social connections. I hope you find this information useful.   -----------------------------------------------------------------------------------------------------------------------------------------------------------------------------------------------------------------------------------------------------------  NUTRITION: -eat real food: lots of colorful vegetables (half the plate) and fruits -5-7 servings of vegetables and fruits per day (fresh or steamed is best), exp. 2 servings of vegetables with lunch and dinner and 2 servings of fruit per day. Berries and greens such as kale and collards are great choices.  -consume on a regular  basis: whole grains (make sure first ingredient on label contains the word "whole"), fresh fruits, fish, nuts, seeds, healthy oils (such as olive oil, avocado oil, grape seed oil) -may eat small amounts of dairy and lean meat on occasion, but avoid processed meats such as ham, bacon, lunch meat, etc. -drink water -try to avoid fast food and pre-packaged foods, processed meat -most experts advise limiting sodium to < 2300mg  per day, should limit further is any chronic conditions such as high blood pressure, heart disease, diabetes, etc. The American Heart Association advised that < 1500mg  is is ideal -try to avoid foods that contain any ingredients with names you do not recognize  -try to avoid sugar/sweets (except for the natural sugar that occurs in fresh fruit) -try to avoid sweet drinks -try to avoid white rice, white bread, pasta (unless whole grain), white or yellow potatoes  EXERCISE GUIDELINES FOR ADULTS: -if you wish to increase your physical activity, do so gradually and with the approval of your doctor -STOP and seek medical care immediately if you have any chest pain, chest discomfort or trouble breathing when starting or increasing exercise  -move and stretch your body, legs, feet and arms when sitting for long periods -Physical activity guidelines for optimal health in adults: -least 150 minutes per week of aerobic exercise (can talk, but not sing) once approved by your doctor, 20-30 minutes of sustained activity or two 10 minute episodes of sustained activity every day.  -resistance training at least 2 days per week if approved by your doctor -balance exercises 3+ days per week:   Stand somewhere where you have something sturdy to hold onto if you lose balance.    1)  lift up on toes, start with 5x per day and work up to 20x   2) stand and lift on leg straight out to the side so that foot is a few inches of the floor, start with 5x each side and work up to 20x each side   3) stand  on one foot, start with 5 seconds each side and work up to 20 seconds on each side  If you need ideas or help with getting more active:  -Silver sneakers https://tools.silversneakers.com  -Walk with a Doc: http://www.duncan-williams.com/  -try to include resistance (weight lifting/strength building) and balance exercises twice per week: or the following link for ideas: http://castillo-powell.com/  BuyDucts.dk  STRESS MANAGEMENT: -can try meditating, or just sitting quietly with deep breathing while intentionally relaxing all parts of your body for 5 minutes daily -if you need further help with stress, anxiety or depression please follow up with your primary doctor or contact the wonderful folks at [facility]: [phone]  SOCIAL CONNECTIONS: -options in [place] if you wish to engage in more social and exercise related activities:  -Silver sneakers https://tools.silversneakers.com  -Walk with a Doc: http://www.duncan-williams.com/  -Check out the [facility] Active Adults 50+ section on the [place] [employer] (hiking clubs, book clubs, cards and games, chess, exercise classes, aquatic classes and much more) - see the website for details: https://www.[place].gov/departments/parks-recreation/active-adults50  -YouTube has lots of exercise videos for different ages and abilities as well  -[facility] (a variety of indoor and outdoor inperson activities for adults). [phone]. [address].  -Virtual Online Classes (a variety of topics): see seniorplanet.org or call [phone]  -consider volunteering at a school, hospice center, church, senior center or elsewhere         ADVANCED HEALTHCARE DIRECTIVES:  [place] Advanced Directives assistance:   ExpressWeek.com.cy  Everyone should have  advanced health care directives in place. This is so that you get the care you want, should you ever be in a situation where you are unable to make your own medical decisions.   From the [place] Advanced Directive Website: "Advance Health Care Directives are legal documents in which you give written instructions about your health care if, in the future, you cannot speak for yourself.   A health care power of attorney allows you to name a person you trust to make your health care decisions if you cannot make them yourself. A declaration of a desire for a natural death (or living will) is document, which states that you desire not to have your life prolonged by extraordinary measures if you have a terminal or incurable illness or if you are in a vegetative state. An advance instruction for mental health treatment makes a declaration of instructions, information and preferences regarding your mental health treatment. It also states that you are aware that the advance instruction authorizes a mental health treatment provider to act according to your wishes. It may also outline your consent or refusal of mental health treatment. A declaration of an anatomical gift allows anyone over the age of 57 to make a gift by will, organ donor card or other document."   Please see the following website or an elder law attorney for forms, FAQs and for completion of advanced directives: [place] Health Care Directives Advance Health Care Directives (http://guzman.com/)  Or copy and paste the following to your web browser: PoshChat.fi    -I sent the medication(s) we discussed to your pharmacy: Meds ordered this encounter  Medications  doxycycline (VIBRA-TABS) 100 MG tablet    Sig: Take 1 tablet (100 mg total) by mouth 2 (two) times daily.    Dispense:  14 tablet    Refill:  0   benzonatate (TESSALON PERLES) 100 MG capsule    Sig: 1-2 capsules up to twice daily as needed for  cough.    Dispense:  30 capsule    Refill:  0     I hope you are feeling better soon!  Seek in person care promptly if your symptoms worsen, new concerns arise or you are not improving with treatment.  It was nice to meet you today. I help [facility] out with telemedicine visits on Tuesdays and Thursdays and am happy to help if you need a virtual follow up visit on those days. Otherwise, if you have any concerns or questions following this visit please schedule a follow up visit with your Primary Care office or seek care at a local urgent care clinic to avoid delays in care. If you are having severe or life threatening symptoms please call 911 and/or go to the nearest emergency room.

## 2022-11-17 NOTE — Progress Notes (Signed)
PATIENT CHECK-IN and HEALTH RISK ASSESSMENT QUESTIONNAIRE:  -completed by phone/video for upcoming Medicare Preventive Visit  Pre-Visit Check-in: 1)Vitals (height, wt, BP, etc) - record in vitals section for visit on day of visit Request home vitals (wt, BP, etc.) and enter into vitals, THEN update Vital Signs SmartPhrase below at the top of the HPI. See below.  2)Review and Update Medications, Allergies PMH, Surgeries, Social history in Epic 3)Hospitalizations in the last year with date/reason?  In February - dehydration from the cancer drugs  4)Review and Update Care Team (patient's specialists) in Epic 5) Complete PHQ9 in Epic  6) Complete Fall Screening in Epic 7)Review all Health Maintenance Due and order under PCP if not done.  8)Medicare Wellness Questionnaire: Answer theses question about your habits: Do you drink alcohol?  No - except rarely if on vacation will have a drink Have you ever smoked? Not currently, smoked in the past  How many packs a day do/did you smoke? Up to 1 ppd at one point Do you use smokeless tobacco? no Do you use an illicit drugs? no Do you exercises?  no During cancer treatments had poor appetite, now doing much better, eating better theses days. She likes fruit.   Beverages: water, juice, ice tea  Answer theses question about you: Can you perform most household chores? yes Do you find it hard to follow a conversation in a noisy room? none Do you often ask people to speak up or repeat themselves?no Do you feel that you have a problem with memory? no Do you balance your checkbook and or bank acounts? yes Do you feel safe at home? yes Last dentist visit? Going in about a week, usually goes every 6-12 months Do you need assistance with any of the following: Please note if so - none  Driving?  Feeding yourself?  Getting from bed to chair?  Getting to the toilet?  Bathing or showering?  Dressing yourself?  Managing money?  Climbing a flight of  stairs  Preparing meals?  Do you have Advanced Directives in place (Living Will, Healthcare Power or Attorney)? no   Last eye Exam and location? Goes on a regular basis, had cataract surgery a few years ago   Do you currently use prescribed or non-prescribed narcotic or opioid pain medications? no  Do you have a history or close family history of breast, ovarian, tubal or peritoneal cancer or a family member with BRCA (breast cancer susceptibility 1 and 2) gene mutations?  Request home vitals (wt, BP, etc.) and enter into vitals, THEN update Vital Signs SmartPhrase below at the top of the HPI. See below.      ----------------------------------------------------------------------------------------------------------------------------------------------------------------------------------------------------------------------  Because this visit was a virtual/telehealth visit, some criteria may be missing or patient reported. Any vitals not documented were not able to be obtained and vitals that have been documented are patient reported.    MEDICARE ANNUAL PREVENTIVE VISIT WITH PROVIDER: (Welcome to Medicare, initial annual wellness or annual wellness exam)  Virtual Visit via Phone Note  I connected with Sue Chase on 11/17/22 by phone and verified that I am speaking with the correct person using two identifiers.  Location patient: home Location provider:work or home office Persons participating in the virtual visit: patient, provider  Concerns and/or follow up today:  Cough and congestion: -about 8 days ago came down with a cold -she did check a covid test in the beginning which was negative -today feels like it is worsening a little, cough is worse, thicker  mucus -reports has SOB at baseline and is about how it is when she is not sick -has not used albuterol yet -no fevers, vomiting, body aches, malaise  See HM section in Epic for other details of completed HM.     ROS: negative for report of fevers, unintentional weight loss, vision changes, vision loss, hearing loss or change, chest pain, sob, hemoptysis, melena, hematochezia, hematuria, falls, bleeding or bruising, thoughts of suicide or self harm, memory loss  Patient-completed extensive health risk assessment - reviewed and discussed with the patient: See Health Risk Assessment completed with patient prior to the visit either above or in recent phone note. This was reviewed in detailed with the patient today and appropriate recommendations, orders and referrals were placed as needed per Summary below and patient instructions.   Review of Medical History: -PMH, PSH, Family History and current specialty and care providers reviewed and updated and listed below   Patient Care Team: [name], NP as PCP - General (Family Medicine)   Past Medical History:  Diagnosis Date   Cancer [facility])    Colon polyps    Complication of anesthesia    tolerated propofol but other meds made her emotional / cry/ difficulty breathing/ panic   Hyperlipidemia    Rheumatic fever    Seasonal allergies    UTI (urinary tract infection)     Past Surgical History:  Procedure Laterality Date   BRONCHIAL BIOPSY  06/08/2021   Procedure: BRONCHIAL BIOPSIES;  Surgeon: Josephine Igo, DO;  Location: [facility];  Service: Pulmonary;;   BRONCHIAL BRUSHINGS  06/08/2021   Procedure: BRONCHIAL BRUSHINGS;  Surgeon: Josephine Igo, DO;  Location: [facility];  Service: Pulmonary;;   BRONCHIAL NEEDLE ASPIRATION BIOPSY  06/08/2021   Procedure: BRONCHIAL NEEDLE ASPIRATION BIOPSIES;  Surgeon: Josephine Igo, DO;  Location: [facility];  Service: Pulmonary;;   COLONOSCOPY  2018   ERCP N/A 11/05/2021   Procedure: ENDOSCOPIC RETROGRADE CHOLANGIOPANCREATOGRAPHY (ERCP);  Surgeon: Hilarie Fredrickson, MD;  Location: [facility];  Service: Gastroenterology;  Laterality: N/A;   IR IMAGING GUIDED PORT INSERTION  08/12/2021   REMOVAL OF  STONES  11/05/2021   Procedure: REMOVAL OF STONES;  Surgeon: Hilarie Fredrickson, MD;  Location: [facility];  Service: Gastroenterology;;   SALIVARY GLAND SURGERY     LATE 80S   SPHINCTEROTOMY  11/05/2021   Procedure: SPHINCTEROTOMY;  Surgeon: Hilarie Fredrickson, MD;  Location: [facility];  Service: Gastroenterology;;   TUBAL LIGATION  1986   VIDEO BRONCHOSCOPY WITH RADIAL ENDOBRONCHIAL ULTRASOUND  06/08/2021   Procedure: VIDEO BRONCHOSCOPY WITH RADIAL ENDOBRONCHIAL ULTRASOUND;  Surgeon: Josephine Igo, DO;  Location: [facility];  Service: Pulmonary;;    Social History   Socioeconomic History   Marital status: Widowed    Spouse name: Not on file   Number of children: Not on file   Years of education: Not on file   Highest education level: Some college, no degree  Occupational History   Not on file  Tobacco Use   Smoking status: Some Days    Current packs/day: 1.00    Average packs/day: 1 pack/day for 40.0 years (40.0 ttl pk-yrs)    Types: Cigarettes    Passive exposure: Current   Smokeless tobacco: Never   Tobacco comments:    Patient smokes 3-4 cigarettes a week.  Patient is currently around smokers.  Updated 09/13/2022.  Vaping Use   Vaping status: Never Used  Substance and Sexual Activity   Alcohol use: Yes  Comment: Rum and Coke/Unsure of amount   Drug use: Never   Sexual activity: Not on file  Other Topics Concern   Not on file  Social History Narrative   Married    Two grown children    She enjoys reading, walking   Social Determinants of Health   Financial Resource Strain: Medium Risk (10/18/2021)   Overall Financial Resource Strain (CARDIA)    Difficulty of Paying Living Expenses: Somewhat hard  Food Insecurity: No Food Insecurity (04/10/2022)   Hunger Vital Sign    Worried About Running Out of Food in the Last Year: Never true    Ran Out of Food in the Last Year: Never true  Transportation Needs: No Transportation Needs (04/10/2022)   PRAPARE - Therapist, art (Medical): No    Lack of Transportation (Non-Medical): No  Physical Activity: Unknown (10/18/2021)   Exercise Vital Sign    Days of Exercise per Week: Patient declined    Minutes of Exercise per Session: Not on file  Stress: No Stress Concern Present (10/18/2021)   [employer] of Occupational Health - Occupational Stress Questionnaire    Feeling of Stress : Only a little  Social Connections: Unknown (10/18/2021)   Social Connection and Isolation Panel [NHANES]    Frequency of Communication with Friends and Family: Three times a week    Frequency of Social Gatherings with Friends and Family: Patient declined    Attends Religious Services: Patient declined    Active Member of Clubs or Organizations: No    Attends Engineer, structural: Not on file    Marital Status: Widowed  Intimate Partner Violence: Not At Risk (04/10/2022)   Humiliation, Afraid, Rape, and Kick questionnaire    Fear of Current or Ex-Partner: No    Emotionally Abused: No    Physically Abused: No    Sexually Abused: No    Family History  Problem Relation Age of Onset   Arthritis Mother    Diabetes Mother    Heart disease Mother    Hyperlipidemia Mother    Hypertension Mother    Miscarriages / [place] Mother    Hearing loss Father    Liver cancer Father    Lung cancer Father    Hyperlipidemia Father    Diabetes Brother    Lung disease Maternal Grandfather        Black Lung   Diabetes Paternal Grandmother    Cancer Paternal Grandfather    Diabetes Paternal Grandfather     Current Outpatient Medications on File Prior to Visit  Medication Sig Dispense Refill   acetaminophen (TYLENOL) 500 MG tablet Take 500 mg by mouth every 6 (six) hours as needed for moderate pain.     albuterol (VENTOLIN HFA) 108 (90 Base) MCG/ACT inhaler Inhale 2 puffs into the lungs every 6 (six) hours as needed for wheezing or shortness of breath. 8 g 5   Budeson-Glycopyrrol-Formoterol (BREZTRI  AEROSPHERE) 160-9-4.8 MCG/ACT AERO Inhale 2 puffs into the lungs in the morning and at bedtime. 10.7 g 5   Budeson-Glycopyrrol-Formoterol (BREZTRI AEROSPHERE) 160-9-4.8 MCG/ACT AERO Inhale 2 puffs into the lungs in the morning and at bedtime. (Patient not taking: Reported on 09/13/2022)     Budeson-Glycopyrrol-Formoterol (BREZTRI AEROSPHERE) 160-9-4.8 MCG/ACT AERO Inhale 2 puffs into the lungs in the morning and at bedtime.     Budeson-Glycopyrrol-Formoterol (BREZTRI AEROSPHERE) 160-9-4.8 MCG/ACT AERO Inhale 2 puffs into the lungs in the morning and at bedtime.     ELIQUIS 5 MG  TABS tablet TAKE 1 TABLET BY MOUTH 2 TIMES A DAY 60 tablet 2   FeFum-FePoly-FA-B Cmp-C-Biot (INTEGRA PLUS) CAPS Take 1 capsule by mouth every morning. (Patient not taking: Reported on 09/13/2022) 30 capsule 2   furosemide (LASIX) 40 MG tablet Take 1 tablet (40 mg total) by mouth daily as needed for edema. (Patient not taking: Reported on 06/22/2022) 30 tablet 2   hydrOXYzine (ATARAX) 10 MG tablet Take 1 tablet (10 mg total) by mouth 3 (three) times daily as needed for itching. (Patient not taking: Reported on 09/13/2022) 60 tablet 0   lidocaine-prilocaine (EMLA) cream Apply to the Port-A-Cath site 30 minutes before chemotherapy (Patient not taking: Reported on 09/13/2022) 30 g 0   megestrol (MEGACE) 20 MG tablet Take 1 tablet (20 mg total) by mouth daily. (Patient not taking: Reported on 06/22/2022) 30 tablet 2   omeprazole (PRILOSEC) 20 MG capsule Take 1 capsule (20 mg total) by mouth daily. (Patient not taking: Reported on 09/13/2022) 30 capsule 3   ondansetron (ZOFRAN-ODT) 4 MG disintegrating tablet Dissolve 1 tablet (4 mg total) by mouth every 8 (eight) hours as needed for nausea or vomiting. (Patient not taking: Reported on 09/13/2022) 20 tablet 0   potassium chloride SA (KLOR-CON M) 20 MEQ tablet Take 1 tablet (20 mEq total) by mouth daily. 10 tablet 0   prochlorperazine (COMPAZINE) 10 MG tablet Take 1 tablet (10 mg total) by mouth  every 6 (six) hours as needed for nausea or vomiting. (Patient not taking: Reported on 09/13/2022) 30 tablet 0   tiZANidine (ZANAFLEX) 2 MG tablet Take 1 tablet (2 mg total) by mouth every 8 (eight) hours as needed for muscle spasms. 30 tablet 2   No current facility-administered medications on file prior to visit.    Allergies  Allergen Reactions   Codeine Itching       Physical Exam Vitals requested from patient and listed below if patient had equipment and was able to obtain at home for this virtual visit: There were no vitals filed for this visit. Estimated body mass index is 17.21 kg/m as calculated from the following:   Height as of 09/26/22: 5\' 2"  (1.575 m).   Weight as of 09/26/22: 94 lb 1.6 oz (42.7 kg).  EKG (optional): deferred due to virtual visit  GENERAL: alert, oriented, no acute distress detected, full vision exam deferred due to pandemic and/or virtual encounter  PSYCH/NEURO: pleasant and cooperative, no obvious depression or anxiety, speech and thought processing grossly intact, Cognitive function grossly intact  Flowsheet Row Office Visit from 03/09/2022 in [facility]  PHQ-9 Total Score 1           11/17/2022   12:05 PM 03/09/2022    9:58 AM 11/11/2021   11:04 AM 02/26/2021    1:03 PM 12/11/2019    2:06 PM  Depression screen PHQ 2/9  Decreased Interest 0 0 0 1 0  Down, Depressed, Hopeless 0 0 0 2 0  PHQ - 2 Score 0 0 0 3 0  Altered sleeping  0 1 1   Tired, decreased energy  1 1 1    Change in appetite  0 1 1   Feeling bad or failure about yourself   0 0 0   Trouble concentrating  0 0 1   Moving slowly or fidgety/restless  0 0 0   Suicidal thoughts  0 0 0   PHQ-9 Score  1 3 7    Difficult doing work/chores  Not difficult at  all Somewhat difficult Not difficult at all        02/24/2022    7:42 AM 02/24/2022    7:55 PM 02/25/2022    7:55 AM 03/09/2022    9:58 AM 11/17/2022   12:05 PM  Fall Risk  Falls in the past year?    0 0   Was there an injury with Fall?    0 0  Fall Risk Category Calculator    0 0  (RETIRED) Patient Fall Risk Level Moderate fall risk Moderate fall risk Moderate fall risk    Patient at Risk for Falls Due to    No Fall Risks   Fall risk Follow up    Falls evaluation completed      SUMMARY AND PLAN:   Acute cough -query VURI, Covid vs developing bacterial resp infection.  -discussed options for evaluation and tx. She preferred to try empiric tx with tessalon for cough and her alb if needed with doxy if not improving over the next 1-2 days. Agrees to seek inperson medical care if worsening.   Encounter for Medicare annual wellness exam  Discussed applicable health maintenance/preventive health measures and advised and referred or ordered per patient preferences: -discussed mammogram and colon cancer screening and she prefers to check with onc before doing as is getting lots of scans right now and wants to wait if possible -discussed boned density and she agrees to call if decides to do -discussed vaccines due and she plans to get - advised to let us know once she does so that we can update her chart Health Maintenance  Topic Date Due   Hepatitis C Screening  Never done   Zoster Vaccines- Shingrix (1 of 2) Never done   MAMMOGRAM  09/16/2011   COVID-19 Vaccine (3 - Pfizer risk series) 07/03/2019   Colonoscopy  09/21/2021   INFLUENZA VACCINE  09/22/2022   Medicare Annual Wellness (AWV)  11/17/2023   DTaP/Tdap/Td (2 - Td or Tdap) 01/03/2029   Pneumonia Vaccine 65+ Years old  Completed   DEXA SCAN  Completed   HPV VACCINES  Aged [employer] and counseling on the following was provided based on the above review of health and a plan/checklist for the patient, along with additional information discussed, was provided for the patient in the patient instructions :  -Advised on importance of completing advanced directives, discussed options for completing and provided information in  patient instructions as well -Provided  safe balance exercises that can be done at home to improve balance and discussed exercise guidelines for adults with include balance exercises at least 3 days per week.  -Advised and counseled on a healthy lifestyle  -Reviewed patient's current diet. Advised and counseled on a whole foods based healthy diet. A summary of a healthy diet was provided in the Patient Instructions.  -reviewed patient's current physical activity level and discussed exercise guidelines for adults. Discussed community resources and ideas for safe exercise at home to assist in meeting exercise guideline recommendations in a safe and healthy way.  -Advise yearly dental visits at minimum and regular eye exams -Advised and counseled on alcohol safe limits  Follow up: see patient instructions     Patient Instructions  I really enjoyed getting to talk with you today! I am available on Tuesdays and Thursdays for virtual visits if you have any questions or concerns, or if I can be of any further assistance.   CHECKLIST FROM ANNUAL WELLNESS VISIT:  -Follow up (please  call to schedule if not scheduled after visit):   -yearly for annual wellness visit with primary care office  Here is a list of your preventive care/health maintenance measures and the plan for each if any are due:  PLAN For any measures below that may be due:    Health Maintenance  Topic Date Due   Hepatitis C Screening  Never done   Zoster Vaccines- Shingrix (1 of 2) Never done   MAMMOGRAM  09/16/2011   COVID-19 Vaccine (3 - Pfizer risk series) 07/03/2019   Colonoscopy  09/21/2021   INFLUENZA VACCINE  09/22/2022   Medicare Annual Wellness (AWV)  11/17/2023   DTaP/Tdap/Td (2 - Td or Tdap) 01/03/2029   Pneumonia Vaccine 62+ Years old  Completed   DEXA SCAN  Completed   HPV VACCINES  Aged Out    -See a dentist at least yearly  -Get your eyes checked and then per your eye specialist's  recommendations  -Other issues addressed today:   -I have included below further information regarding a healthy whole foods based diet, physical activity guidelines for adults, stress management and opportunities for social connections. I hope you find this information useful.   -----------------------------------------------------------------------------------------------------------------------------------------------------------------------------------------------------------------------------------------------------------  NUTRITION: -eat real food: lots of colorful vegetables (half the plate) and fruits -5-7 servings of vegetables and fruits per day (fresh or steamed is best), exp. 2 servings of vegetables with lunch and dinner and 2 servings of fruit per day. Berries and greens such as kale and collards are great choices.  -consume on a regular basis: whole grains (make sure first ingredient on label contains the word "whole"), fresh fruits, fish, nuts, seeds, healthy oils (such as olive oil, avocado oil, grape seed oil) -may eat small amounts of dairy and lean meat on occasion, but avoid processed meats such as ham, bacon, lunch meat, etc. -drink water -try to avoid fast food and pre-packaged foods, processed meat -most experts advise limiting sodium to < 2300mg  per day, should limit further is any chronic conditions such as high blood pressure, heart disease, diabetes, etc. The American Heart Association advised that < 1500mg  is is ideal -try to avoid foods that contain any ingredients with names you do not recognize  -try to avoid sugar/sweets (except for the natural sugar that occurs in fresh fruit) -try to avoid sweet drinks -try to avoid white rice, white bread, pasta (unless whole grain), white or yellow potatoes  EXERCISE GUIDELINES FOR ADULTS: -if you wish to increase your physical activity, do so gradually and with the approval of your doctor -STOP and seek medical care  immediately if you have any chest pain, chest discomfort or trouble breathing when starting or increasing exercise  -move and stretch your body, legs, feet and arms when sitting for long periods -Physical activity guidelines for optimal health in adults: -least 150 minutes per week of aerobic exercise (can talk, but not sing) once approved by your doctor, 20-30 minutes of sustained activity or two 10 minute episodes of sustained activity every day.  -resistance training at least 2 days per week if approved by your doctor -balance exercises 3+ days per week:   Stand somewhere where you have something sturdy to hold onto if you lose balance.    1) lift up on toes, start with 5x per day and work up to 20x   2) stand and lift on leg straight out to the side so that foot is a few inches of the floor, start with 5x each side and work up  to 20x each side   3) stand on one foot, start with 5 seconds each side and work up to 20 seconds on each side  If you need ideas or help with getting more active:  -Silver sneakers https://tools.silversneakers.com  -Walk with a Doc: http://www.duncan-williams.com/  -try to include resistance (weight lifting/strength building) and balance exercises twice per week: or the following link for ideas: http://castillo-powell.com/  BuyDucts.dk  STRESS MANAGEMENT: -can try meditating, or just sitting quietly with deep breathing while intentionally relaxing all parts of your body for 5 minutes daily -if you need further help with stress, anxiety or depression please follow up with your primary doctor or contact the wonderful folks at [facility]: [phone]  SOCIAL CONNECTIONS: -options in [place] if you wish to engage in more social and exercise related activities:  -Silver sneakers https://tools.silversneakers.com  -Walk with a  Doc: http://www.duncan-williams.com/  -Check out the [facility] Active Adults 50+ section on the [place] of [employer] (hiking clubs, book clubs, cards and games, chess, exercise classes, aquatic classes and much more) - see the website for details: https://www.[place].gov/departments/parks-recreation/active-adults50  -YouTube has lots of exercise videos for different ages and abilities as well  -[facility] (a variety of indoor and outdoor inperson activities for adults). 9286865324. [address]  -Virtual Online Classes (a variety of topics): see seniorplanet.org or call [phone]  -consider volunteering at a school, hospice center, church, senior center or elsewhere         ADVANCED HEALTHCARE DIRECTIVES:  [place] Advanced Directives assistance:   ExpressWeek.com.cy  Everyone should have advanced health care directives in place. This is so that you get the care you want, should you ever be in a situation where you are unable to make your own medical decisions.   From the [place] Advanced Directive Website: "Advance Health Care Directives are legal documents in which you give written instructions about your health care if, in the future, you cannot speak for yourself.   A health care power of attorney allows you to name a person you trust to make your health care decisions if you cannot make them yourself. A declaration of a desire for a natural death (or living will) is document, which states that you desire not to have your life prolonged by extraordinary measures if you have a terminal or incurable illness or if you are in a vegetative state. An advance instruction for mental health treatment makes a declaration of instructions, information and preferences regarding your mental health treatment. It also states that you are aware that the advance instruction authorizes a mental health treatment  provider to act according to your wishes. It may also outline your consent or refusal of mental health treatment. A declaration of an anatomical gift allows anyone over the age of 60 to make a gift by will, organ donor card or other document."   Please see the following website or an elder law attorney for forms, FAQs and for completion of advanced directives: [place] TEFL teacher Health Care Directives Advance Health Care Directives (http://guzman.com/)  Or copy and paste the following to your web browser: PoshChat.fi  \   Terressa Koyanagi, DO

## 2022-11-19 ENCOUNTER — Other Ambulatory Visit: Payer: Self-pay | Admitting: Pulmonary Disease

## 2022-12-16 ENCOUNTER — Telehealth: Payer: Self-pay | Admitting: Medical Oncology

## 2022-12-16 NOTE — Telephone Encounter (Signed)
Supcious lesion on tongue- Dr. Clelia Croft , DDS referred her to oral surgeon -earliest appt in march .Neal will keep appt .

## 2022-12-19 ENCOUNTER — Telehealth: Payer: Self-pay | Admitting: Medical Oncology

## 2022-12-19 NOTE — Telephone Encounter (Signed)
Scans authorized

## 2022-12-21 ENCOUNTER — Ambulatory Visit (HOSPITAL_COMMUNITY)
Admission: RE | Admit: 2022-12-21 | Discharge: 2022-12-21 | Disposition: A | Discharge status: Home / Self Care | Payer: Medicare HMO | Source: Ambulatory Visit | Attending: Physician Assistant

## 2022-12-21 ENCOUNTER — Encounter: Payer: Self-pay | Admitting: Physician Assistant

## 2022-12-21 ENCOUNTER — Inpatient Hospital Stay: Payer: Medicare HMO | Attending: Oncology

## 2022-12-21 DIAGNOSIS — C3491 Malignant neoplasm of unspecified part of right bronchus or lung: Secondary | ICD-10-CM | POA: Insufficient documentation

## 2022-12-21 DIAGNOSIS — I7101 Dissection of ascending aorta: Secondary | ICD-10-CM | POA: Insufficient documentation

## 2022-12-21 DIAGNOSIS — I7121 Aneurysm of the ascending aorta, without rupture: Secondary | ICD-10-CM | POA: Diagnosis not present

## 2022-12-21 DIAGNOSIS — Z85118 Personal history of other malignant neoplasm of bronchus and lung: Secondary | ICD-10-CM | POA: Diagnosis not present

## 2022-12-21 DIAGNOSIS — E876 Hypokalemia: Secondary | ICD-10-CM

## 2022-12-21 DIAGNOSIS — I700 Atherosclerosis of aorta: Secondary | ICD-10-CM | POA: Diagnosis not present

## 2022-12-21 DIAGNOSIS — K5730 Diverticulosis of large intestine without perforation or abscess without bleeding: Secondary | ICD-10-CM | POA: Diagnosis not present

## 2022-12-21 DIAGNOSIS — R918 Other nonspecific abnormal finding of lung field: Secondary | ICD-10-CM | POA: Diagnosis not present

## 2022-12-21 DIAGNOSIS — Z95828 Presence of other vascular implants and grafts: Secondary | ICD-10-CM

## 2022-12-21 LAB — CMP (CANCER CENTER ONLY)
ALT: 6 U/L (ref 0–44)
AST: 12 U/L — ABNORMAL LOW (ref 15–41)
Albumin: 4.1 g/dL (ref 3.5–5.0)
Alkaline Phosphatase: 80 U/L (ref 38–126)
Anion gap: 6 (ref 5–15)
BUN: 18 mg/dL (ref 8–23)
CO2: 27 mmol/L (ref 22–32)
Calcium: 9.6 mg/dL (ref 8.9–10.3)
Chloride: 107 mmol/L (ref 98–111)
Creatinine: 1.16 mg/dL — ABNORMAL HIGH (ref 0.44–1.00)
GFR, Estimated: 52 mL/min — ABNORMAL LOW (ref >60)
Glucose, Bld: 83 mg/dL (ref 70–99)
Potassium: 3.8 mmol/L (ref 3.5–5.1)
Sodium: 140 mmol/L (ref 135–145)
Total Bilirubin: 0.3 mg/dL (ref 0.3–1.2)
Total Protein: 6.7 g/dL (ref 6.5–8.1)

## 2022-12-21 LAB — CBC WITH DIFFERENTIAL (CANCER CENTER ONLY)
Abs Immature Granulocytes: 0.02 10*3/uL (ref 0.00–0.07)
Basophils Absolute: 0.1 10*3/uL (ref 0.0–0.1)
Basophils Relative: 1 %
Eosinophils Absolute: 0.5 10*3/uL (ref 0.0–0.5)
Eosinophils Relative: 6 %
HCT: 34.9 % — ABNORMAL LOW (ref 36.0–46.0)
Hemoglobin: 11.6 g/dL — ABNORMAL LOW (ref 12.0–15.0)
Immature Granulocytes: 0 %
Lymphocytes Relative: 34 %
Lymphs Abs: 3 10*3/uL (ref 0.7–4.0)
MCH: 30.2 pg (ref 26.0–34.0)
MCHC: 33.2 g/dL (ref 30.0–36.0)
MCV: 90.9 fL (ref 80.0–100.0)
Monocytes Absolute: 0.6 10*3/uL (ref 0.1–1.0)
Monocytes Relative: 7 %
Neutro Abs: 4.6 10*3/uL (ref 1.7–7.7)
Neutrophils Relative %: 52 %
Platelet Count: 185 10*3/uL (ref 150–400)
RBC: 3.84 MIL/uL — ABNORMAL LOW (ref 3.87–5.11)
RDW: 13.3 % (ref 11.5–15.5)
WBC Count: 8.8 10*3/uL (ref 4.0–10.5)
nRBC: 0 % (ref 0.0–0.2)

## 2022-12-21 LAB — MAGNESIUM: Magnesium: 1.6 mg/dL — ABNORMAL LOW (ref 1.7–2.4)

## 2022-12-21 LAB — SAMPLE TO BLOOD BANK

## 2022-12-21 MED ORDER — IOHEXOL 9 MG/ML PO SOLN
1000.0000 mL | ORAL | Status: AC
  Administered 2022-12-21: 1000 mL via ORAL

## 2022-12-21 MED ORDER — SODIUM CHLORIDE 0.9% FLUSH
10.0000 mL | Freq: Once | INTRAVENOUS | Status: AC
  Administered 2022-12-21: 10 mL

## 2022-12-21 MED ORDER — IOHEXOL 300 MG/ML  SOLN
80.0000 mL | Freq: Once | INTRAMUSCULAR | Status: AC | PRN
  Administered 2022-12-21: 80 mL via INTRAVENOUS

## 2022-12-21 MED ORDER — HEPARIN SOD (PORK) LOCK FLUSH 100 UNIT/ML IV SOLN
500.0000 [IU] | Freq: Once | INTRAVENOUS | Status: AC
  Administered 2022-12-21: 500 [IU] via INTRAVENOUS

## 2022-12-21 MED ORDER — IOHEXOL 9 MG/ML PO SOLN
ORAL | Status: AC
  Filled 2022-12-21: qty 1000

## 2022-12-21 MED ORDER — HEPARIN SOD (PORK) LOCK FLUSH 100 UNIT/ML IV SOLN
INTRAVENOUS | Status: AC
  Filled 2022-12-21: qty 5

## 2022-12-28 ENCOUNTER — Inpatient Hospital Stay: Payer: Medicare HMO | Attending: Oncology | Admitting: Internal Medicine

## 2022-12-28 VITALS — BP 130/84 | HR 64 | Temp 97.5°F | Resp 16 | Ht 62.0 in | Wt 103.3 lb

## 2022-12-28 DIAGNOSIS — J449 Chronic obstructive pulmonary disease, unspecified: Secondary | ICD-10-CM | POA: Diagnosis not present

## 2022-12-28 DIAGNOSIS — K1370 Unspecified lesions of oral mucosa: Secondary | ICD-10-CM | POA: Insufficient documentation

## 2022-12-28 DIAGNOSIS — R0602 Shortness of breath: Secondary | ICD-10-CM | POA: Diagnosis not present

## 2022-12-28 DIAGNOSIS — C3490 Malignant neoplasm of unspecified part of unspecified bronchus or lung: Secondary | ICD-10-CM | POA: Diagnosis not present

## 2022-12-28 DIAGNOSIS — Z9221 Personal history of antineoplastic chemotherapy: Secondary | ICD-10-CM | POA: Insufficient documentation

## 2022-12-28 DIAGNOSIS — C3411 Malignant neoplasm of upper lobe, right bronchus or lung: Secondary | ICD-10-CM | POA: Diagnosis not present

## 2022-12-28 DIAGNOSIS — F419 Anxiety disorder, unspecified: Secondary | ICD-10-CM | POA: Diagnosis not present

## 2022-12-28 NOTE — Progress Notes (Signed)
[facility] [phone]   [phone]  OFFICE PROGRESS NOTE  [name], NP [address] [place] [address]  DIAGNOSIS:  Stage IV (T2a, N0, M1a) non-small cell lung cancer, adenocarcinoma presented with multifocal bilateral pulmonary nodules involving the right upper lobe as well as the smaller bilateral groundglass opacities diagnosed in April 2023.  PD-L1 expression is 4%.  Molecular studies by [employer] 360 tissue test showed positive KRAS G12C mutation but the blood test failed secondary to insufficient circulating tumor DNA.  PRIOR THERAPY: Systemic chemotherapy with carboplatin for AUC of 5, Alimta 500 Mg/M2 and Keytruda 200 Mg IV every 3 weeks.  First dose 08/10/2021.  Status post 8 cycles.  Starting from cycle #5 the patient is on maintenance treatment with Alimta and Keytruda every 3 weeks.  This treatment was discontinued secondary to disease progression      CURRENT THERAPY:  Observation, was on Krazati (Adagrasib) 600 mg p.o. twice daily.  First dose started 03/08/2022. Status post about 1 month of treatment. We reduced dose to 400 mg BID starting from today 04/05/22 due to intolerance. Treatment currently on hold starting from 04/15/22 due to intolerance.  INTERVAL HISTORY: Sue Chase 67 y.o. female returns to the clinic today for 41-month follow-up visit.Discussed the use of AI scribe software for clinical note transcription with the patient, who gave verbal consent to proceed.  History of Present Illness   Sue Chase, a 67 year old patient with a history of stage four non-small cell lung cancer (adenocarcinoma), was diagnosed in April 2023. The patient's cancer was found to have a KRAS G12C mutation, leading to the initiation of an oral drug regimen after initial chemotherapy with carboplatin, Alimta, and Keytruda. However, the patient could only tolerate the oral drug for two months before discontinuation in  February 2024. Since then, the patient has been under observation.  In the past three months, the patient experienced a cold in mid-September, which has resulted in a persistent cough and chest congestion. The patient managed these symptoms with Mucinex for two weeks. The patient also reported episodes of shortness of breath associated with anxiety, which were managed with an unspecified medication given during a hospital stay about a year ago.  The patient also reported a single episode of vomiting the morning after receiving a flu shot. However, there have been no other instances of nausea, vomiting, or diarrhea. The patient also noted a weight gain of nine pounds since the last visit in August, from 94 to 103 pounds.  Lastly, the patient mentioned a recent dental visit where white spots were observed on the side of the tongue. The dentist recommended a consultation with an oral surgeon, but the patient has been unable to secure an appointment before March.        MEDICAL HISTORY: Past Medical History:  Diagnosis Date   Cancer [facility])    Colon polyps    Complication of anesthesia    tolerated propofol but other meds made her emotional / cry/ difficulty breathing/ panic   Hyperlipidemia    Rheumatic fever    Seasonal allergies    UTI (urinary tract infection)     ALLERGIES:  is allergic to codeine.  MEDICATIONS:  Current Outpatient Medications  Medication Sig Dispense Refill   acetaminophen (TYLENOL) 500 MG tablet Take 500 mg by mouth every 6 (six) hours as needed for moderate pain.     albuterol (VENTOLIN HFA) 108 (90 Base) MCG/ACT inhaler Inhale 2 puffs  into the lungs every 6 (six) hours as needed for wheezing or shortness of breath. 8 g 5   benzonatate (TESSALON PERLES) 100 MG capsule 1-2 capsules up to twice daily as needed for cough. 30 capsule 0   Budeson-Glycopyrrol-Formoterol (BREZTRI AEROSPHERE) 160-9-4.8 MCG/ACT AERO Inhale 2 puffs into the lungs in the morning and at  bedtime. 10.7 g 5   Budeson-Glycopyrrol-Formoterol (BREZTRI AEROSPHERE) 160-9-4.8 MCG/ACT AERO Inhale 2 puffs into the lungs in the morning and at bedtime. (Patient not taking: Reported on 09/13/2022)     Budeson-Glycopyrrol-Formoterol (BREZTRI AEROSPHERE) 160-9-4.8 MCG/ACT AERO Inhale 2 puffs into the lungs in the morning and at bedtime.     Budeson-Glycopyrrol-Formoterol (BREZTRI AEROSPHERE) 160-9-4.8 MCG/ACT AERO Inhale 2 puffs into the lungs in the morning and at bedtime.     Budeson-Glycopyrrol-Formoterol (BREZTRI AEROSPHERE) 160-9-4.8 MCG/ACT AERO INHALE TWO PUFFS BY MOUTH TWICE A DAY IN THE MORNING AND AT BEDTIME 10.7 g 6   doxycycline (VIBRA-TABS) 100 MG tablet Take 1 tablet (100 mg total) by mouth 2 (two) times daily. 14 tablet 0   ELIQUIS 5 MG TABS tablet TAKE 1 TABLET BY MOUTH 2 TIMES A DAY 60 tablet 2   FeFum-FePoly-FA-B Cmp-C-Biot (INTEGRA PLUS) CAPS Take 1 capsule by mouth every morning. (Patient not taking: Reported on 09/13/2022) 30 capsule 2   furosemide (LASIX) 40 MG tablet Take 1 tablet (40 mg total) by mouth daily as needed for edema. (Patient not taking: Reported on 06/22/2022) 30 tablet 2   hydrOXYzine (ATARAX) 10 MG tablet Take 1 tablet (10 mg total) by mouth 3 (three) times daily as needed for itching. (Patient not taking: Reported on 09/13/2022) 60 tablet 0   lidocaine-prilocaine (EMLA) cream Apply to the Port-A-Cath site 30 minutes before chemotherapy (Patient not taking: Reported on 09/13/2022) 30 g 0   megestrol (MEGACE) 20 MG tablet Take 1 tablet (20 mg total) by mouth daily. (Patient not taking: Reported on 06/22/2022) 30 tablet 2   omeprazole (PRILOSEC) 20 MG capsule Take 1 capsule (20 mg total) by mouth daily. (Patient not taking: Reported on 09/13/2022) 30 capsule 3   ondansetron (ZOFRAN-ODT) 4 MG disintegrating tablet Dissolve 1 tablet (4 mg total) by mouth every 8 (eight) hours as needed for nausea or vomiting. (Patient not taking: Reported on 09/13/2022) 20 tablet 0    potassium chloride SA (KLOR-CON M) 20 MEQ tablet Take 1 tablet (20 mEq total) by mouth daily. 10 tablet 0   prochlorperazine (COMPAZINE) 10 MG tablet Take 1 tablet (10 mg total) by mouth every 6 (six) hours as needed for nausea or vomiting. (Patient not taking: Reported on 09/13/2022) 30 tablet 0   tiZANidine (ZANAFLEX) 2 MG tablet Take 1 tablet (2 mg total) by mouth every 8 (eight) hours as needed for muscle spasms. 30 tablet 2   No current facility-administered medications for this visit.    SURGICAL HISTORY:  Past Surgical History:  Procedure Laterality Date   BRONCHIAL BIOPSY  06/08/2021   Procedure: BRONCHIAL BIOPSIES;  Surgeon: Josephine Igo, DO;  Location: [facility];  Service: Pulmonary;;   BRONCHIAL BRUSHINGS  06/08/2021   Procedure: BRONCHIAL BRUSHINGS;  Surgeon: Josephine Igo, DO;  Location: [facility];  Service: Pulmonary;;   BRONCHIAL NEEDLE ASPIRATION BIOPSY  06/08/2021   Procedure: BRONCHIAL NEEDLE ASPIRATION BIOPSIES;  Surgeon: Josephine Igo, DO;  Location: [facility];  Service: Pulmonary;;   COLONOSCOPY  2018   ERCP N/A 11/05/2021   Procedure: ENDOSCOPIC RETROGRADE CHOLANGIOPANCREATOGRAPHY (ERCP);  Surgeon: Hilarie Fredrickson, MD;  Location:  [facility];  Service: Gastroenterology;  Laterality: N/A;   IR IMAGING GUIDED PORT INSERTION  08/12/2021   REMOVAL OF STONES  11/05/2021   Procedure: REMOVAL OF STONES;  Surgeon: Hilarie Fredrickson, MD;  Location: [facility];  Service: Gastroenterology;;   SALIVARY GLAND SURGERY     LATE 80S   SPHINCTEROTOMY  11/05/2021   Procedure: SPHINCTEROTOMY;  Surgeon: Hilarie Fredrickson, MD;  Location: [facility];  Service: Gastroenterology;;   TUBAL LIGATION  1986   VIDEO BRONCHOSCOPY WITH RADIAL ENDOBRONCHIAL ULTRASOUND  06/08/2021   Procedure: VIDEO BRONCHOSCOPY WITH RADIAL ENDOBRONCHIAL ULTRASOUND;  Surgeon: Josephine Igo, DO;  Location: [facility];  Service: Pulmonary;;    REVIEW OF SYSTEMS:  A comprehensive review of systems was  negative except for: Constitutional: positive for fatigue Respiratory: positive for cough   PHYSICAL EXAMINATION: General appearance: alert, cooperative, fatigued, and no distress Head: Normocephalic, without obvious abnormality, atraumatic Neck: no adenopathy, no JVD, supple, symmetrical, trachea midline, and thyroid not enlarged, symmetric, no tenderness/mass/nodules Lymph nodes: Cervical, supraclavicular, and axillary nodes normal. Resp: clear to auscultation bilaterally Back: symmetric, no curvature. ROM normal. No CVA tenderness. Cardio: regular rate and rhythm, S1, S2 normal, no murmur, click, rub or gallop GI: soft, non-tender; bowel sounds normal; no masses,  no organomegaly Extremities: extremities normal, atraumatic, no cyanosis or edema  ECOG PERFORMANCE STATUS: 1 - Symptomatic but completely ambulatory  Blood pressure 130/84, pulse 64, temperature (!) 97.5 F (36.4 C), resp. rate 16, height 5\' 2"  (1.575 m), weight 103 lb 4.8 oz (46.9 kg), SpO2 100%.  LABORATORY DATA: Lab Results  Component Value Date   WBC 8.8 12/21/2022   HGB 11.6 (L) 12/21/2022   HCT 34.9 (L) 12/21/2022   MCV 90.9 12/21/2022   PLT 185 12/21/2022      Chemistry      Component Value Date/Time   NA 140 12/21/2022 1136   K 3.8 12/21/2022 1136   CL 107 12/21/2022 1136   CO2 27 12/21/2022 1136   BUN 18 12/21/2022 1136   CREATININE 1.16 (H) 12/21/2022 1136      Component Value Date/Time   CALCIUM 9.6 12/21/2022 1136   ALKPHOS 80 12/21/2022 1136   AST 12 (L) 12/21/2022 1136   ALT 6 12/21/2022 1136   BILITOT 0.3 12/21/2022 1136       RADIOGRAPHIC STUDIES: CT CHEST ABDOMEN PELVIS W CONTRAST  Result Date: 12/25/2022 CLINICAL DATA:  History of stage IV adenocarcinoma of the right lung metastatic disease evaluation. * Tracking Code: BO *. EXAM: CT CHEST, ABDOMEN, AND PELVIS WITH CONTRAST TECHNIQUE: Multidetector CT imaging of the chest, abdomen and pelvis was performed following the standard  protocol during bolus administration of intravenous contrast. RADIATION DOSE REDUCTION: This exam was performed according to the departmental dose-optimization program which includes automated exposure control, adjustment of the mA and/or kV according to patient size and/or use of iterative reconstruction technique. CONTRAST:  80mL OMNIPAQUE IOHEXOL 300 MG/ML  SOLN COMPARISON:  Multiple priors including most recent CT September 22, 2022. FINDINGS: CT CHEST FINDINGS Cardiovascular: Accessed right chest Port-A-Cath with tip near the superior cavoatrial junction. Aneurysmal dilation of the ascending thoracic aorta measuring 4 cm, unchanged. Similar chronic focal dissection or penetrating ulceration of the lateral aspect of the proximal aortic arch no central pulmonary embolus on this nondedicated study. Normal size heart. No significant pericardial effusion/thickening. Mediastinum/Nodes: No suspicious thyroid nodule. No pathologically enlarged mediastinal, hilar or axillary lymph nodes. Similar prominent mediastinal lymph nodes. Lungs/Pleura: No significant interval change in the  numerous subsolid, ground-glass and spiculated solid pulmonary nodules. No definite new suspicious pulmonary nodules or masses identified. Previously indexed nodules are as follows: -dominant subsolid nodule in the posterior right upper lobe with tenting of the adjacent major fissure measures 3.0 x 2.5 cm on image 47/4 previously 3.0 x 2.6 cm. -spiculated nodule in the anterior right lower lobe abutting and tenting the major fissure measures 7 x 6 mm on image 116/4, unchanged. -spiculated solid nodule in the posterior left upper lobe measures 4 mm on image 37/4, previously 5 mm. Musculoskeletal: No aggressive lytic or blastic lesion of bone. Multilevel degenerative change of the spine. CT ABDOMEN PELVIS FINDINGS Hepatobiliary: No suspicious hepatic lesion. Gallbladder is unremarkable. No biliary ductal dilation. Pancreas: No pancreatic ductal  dilation or evidence of acute inflammation. Spleen: No splenomegaly. Adrenals/Urinary Tract: Bilateral adrenal glands appear normal. No hydronephrosis. Kidneys demonstrate symmetric enhancement. Urinary bladder is unremarkable for degree of distension. Stomach/Bowel: Radiopaque enteric contrast material traverses distal loops of small bowel. Stomach is minimally distended limiting evaluation. No pathologic dilation of small or large bowel. Colonic diverticulosis without findings of acute diverticulitis. Vascular/Lymphatic: Aortic atherosclerosis. Normal caliber abdominal aorta. Smooth IVC contours. The portal, splenic and superior mesenteric veins are patent. Prominent subcentimeter lymph nodes are stable to prior examination. No pathologically enlarged abdominal or pelvic lymph nodes. Reproductive: No masses or other acute abnormality. Other: No significant abdominopelvic free fluid. Musculoskeletal: No aggressive lytic or blastic lesion of bone. Unchanged mild wedging endplate deformities at T9, T10, L1 and L3. IMPRESSION: 1. No significant interval change in the numerous subsolid, ground-glass and spiculated solid pulmonary nodules. No definite new suspicious pulmonary nodules or masses identified. 2. Unchanged nonspecific prominent subcentimeter retroperitoneal lymph nodes. 3. No evidence of new or progressive disease within the chest, abdomen or pelvis. 4. Unchanged focal dissection or penetrating ulceration of the proximal aortic arch and aneurysmal dilation of the ascending thoracic aorta measuring 4 cm. Recommend attention on follow-up oncologic imaging 5. Colonic diverticulosis without findings of acute diverticulitis. 6.  Aortic Atherosclerosis (ICD10-I70.0). Electronically Signed   By: Maudry Mayhew M.D.   On: 12/25/2022 13:11    ASSESSMENT AND PLAN: This is a very pleasant 67 years old white female diagnosed with stage IV (T2 a, N0, M1 a) non-small cell lung cancer, adenocarcinoma presented with  multifocal bilateral pulmonary nodules involving the right upper lobe as well as the smaller bilateral groundglass opacities diagnosed in April 2023 with positive KRAS G12C mutation and PD-L1 expression of 4%. She underwent systemic chemotherapy with carboplatin for AUC of 5, pemetrexed 500 Mg/M2 and Keytruda 200 Mg IV every 3 weeks status post 8 cycles.  Starting from cycle #5 the patient is on maintenance treatment with Alimta and Keytruda every 3 weeks. The patient then started treatment with Caryn Section (Adagrasib) initially at 600 mg p.o. twice daily for 1 months then her dose was reduced to 400 mg p.o. twice daily but again this was discontinued secondary to intolerance and she has been on observation since that time.  The patient has been in observation since February 2024 with no concerning complaints except for mild cough. She had repeat CT scan of the chest, abdomen and pelvis performed recently.  I personally and independently reviewed the scan and discussed the result with the patient today. Her scan showed no concerning findings for disease progression.    Stage 4 Non-Small Cell Lung Cancer (Adenocarcinoma) with KRAS G12C mutation Stable disease on observation since discontinuation of Adagrasib in February 2024 due to side  effects. No new symptoms suggestive of disease progression. -Continue observation with scans every three months. -Consider other treatment options if disease progression is noted on future scans.  Anxiety with associated shortness of breath Managed with occasional use of an unspecified anxiolytic medication. -Continue current management strategy.  Oral Lesion White spots on the tongue noted by dentist, referral to oral surgeon pending. -Encourage patient to follow up with dentist for expedited referral to oral surgeon.  Weight Gain Noted 9-pound weight gain since last visit in August 2024. -Encourage continued healthy eating habits.  Respiratory Infection Recent  cold in September with persistent cough and chest congestion. No associated chest pain or hemoptysis. -Continue symptomatic management as needed.    For the COPD and her current treatment with inhalers, she will follow-up with Dr. Tonia Brooms for management of this condition. She was advised to call immediately if she has any other concerning symptoms in the interval.  The patient voices understanding of current disease status and treatment options and is in agreement with the current care plan. All questions were answered. The patient knows to call the clinic with any problems, questions or concerns. We can certainly see the patient much sooner if necessary.  The total time spent in the appointment was 30 minutes.  Disclaimer: This note was dictated with voice recognition software. Similar sounding words can inadvertently be transcribed and may not be corrected upon review.

## 2023-01-18 ENCOUNTER — Other Ambulatory Visit: Payer: Self-pay | Admitting: Physician Assistant

## 2023-01-18 DIAGNOSIS — C3491 Malignant neoplasm of unspecified part of right bronchus or lung: Secondary | ICD-10-CM

## 2023-01-23 ENCOUNTER — Telehealth: Payer: Self-pay | Admitting: Internal Medicine

## 2023-01-23 ENCOUNTER — Encounter: Payer: Self-pay | Admitting: Internal Medicine

## 2023-01-23 NOTE — Telephone Encounter (Signed)
 Left patient a message in regards to scheduled appointment times/dates

## 2023-01-23 NOTE — Progress Notes (Signed)
Schedule message sent about port flush appt for mid to late December.

## 2023-02-06 ENCOUNTER — Inpatient Hospital Stay: Payer: Medicare HMO | Attending: Oncology

## 2023-02-06 ENCOUNTER — Inpatient Hospital Stay: Payer: Medicare HMO

## 2023-02-06 DIAGNOSIS — C3411 Malignant neoplasm of upper lobe, right bronchus or lung: Secondary | ICD-10-CM | POA: Diagnosis not present

## 2023-02-06 DIAGNOSIS — C3491 Malignant neoplasm of unspecified part of right bronchus or lung: Secondary | ICD-10-CM

## 2023-02-06 DIAGNOSIS — E876 Hypokalemia: Secondary | ICD-10-CM

## 2023-02-06 DIAGNOSIS — Z95828 Presence of other vascular implants and grafts: Secondary | ICD-10-CM

## 2023-02-06 MED ORDER — SODIUM CHLORIDE 0.9% FLUSH
10.0000 mL | Freq: Once | INTRAVENOUS | Status: AC
Start: 2023-02-06 — End: 2023-02-06
  Administered 2023-02-06: 10 mL

## 2023-02-06 MED ORDER — HEPARIN SOD (PORK) LOCK FLUSH 100 UNIT/ML IV SOLN
500.0000 [IU] | Freq: Once | INTRAVENOUS | Status: AC
Start: 2023-02-06 — End: 2023-02-06
  Administered 2023-02-06: 500 [IU]

## 2023-03-15 ENCOUNTER — Telehealth: Payer: Self-pay

## 2023-03-15 NOTE — Telephone Encounter (Signed)
Spoke with patient in regards to scan appt.  Scheduler Aram Beecham talked to her this morning reporting that her scan had not been scheduled. Reached out to patient and gave her the number to call centralized scheduling at [phone]. Patient said okay and hung up. Will follow up and make sure scan gets scheduled.

## 2023-03-16 ENCOUNTER — Telehealth: Payer: Self-pay | Admitting: Medical Oncology

## 2023-03-16 ENCOUNTER — Telehealth: Payer: Self-pay

## 2023-03-16 NOTE — Telephone Encounter (Signed)
Tried to reach patient about appt for port flush with lab on 1/31 before CT scan.  LVM for return call if any concerns or questions.

## 2023-03-16 NOTE — Telephone Encounter (Signed)
Pt notified to arrive at radiology at 2 pm to start drinking and then come to cancer center for port flush with labs.

## 2023-03-21 ENCOUNTER — Other Ambulatory Visit: Payer: Medicare HMO

## 2023-03-24 ENCOUNTER — Ambulatory Visit (HOSPITAL_COMMUNITY)
Admission: RE | Admit: 2023-03-24 | Discharge: 2023-03-24 | Disposition: A | Discharge status: Home / Self Care | Payer: Medicare HMO | Source: Ambulatory Visit | Attending: Internal Medicine | Admitting: Internal Medicine

## 2023-03-24 ENCOUNTER — Inpatient Hospital Stay: Payer: Medicare HMO | Attending: Oncology

## 2023-03-24 ENCOUNTER — Other Ambulatory Visit: Payer: Self-pay | Admitting: Internal Medicine

## 2023-03-24 DIAGNOSIS — Z95828 Presence of other vascular implants and grafts: Secondary | ICD-10-CM

## 2023-03-24 DIAGNOSIS — C3490 Malignant neoplasm of unspecified part of unspecified bronchus or lung: Secondary | ICD-10-CM

## 2023-03-24 DIAGNOSIS — C3411 Malignant neoplasm of upper lobe, right bronchus or lung: Secondary | ICD-10-CM | POA: Insufficient documentation

## 2023-03-24 DIAGNOSIS — C3491 Malignant neoplasm of unspecified part of right bronchus or lung: Secondary | ICD-10-CM

## 2023-03-24 DIAGNOSIS — I700 Atherosclerosis of aorta: Secondary | ICD-10-CM | POA: Diagnosis not present

## 2023-03-24 DIAGNOSIS — J439 Emphysema, unspecified: Secondary | ICD-10-CM | POA: Diagnosis not present

## 2023-03-24 LAB — CBC WITH DIFFERENTIAL (CANCER CENTER ONLY)
Abs Immature Granulocytes: 0.03 10*3/uL (ref 0.00–0.07)
Basophils Absolute: 0 10*3/uL (ref 0.0–0.1)
Basophils Relative: 0 %
Eosinophils Absolute: 0.5 10*3/uL (ref 0.0–0.5)
Eosinophils Relative: 5 %
HCT: 35.3 % — ABNORMAL LOW (ref 36.0–46.0)
Hemoglobin: 11.6 g/dL — ABNORMAL LOW (ref 12.0–15.0)
Immature Granulocytes: 0 %
Lymphocytes Relative: 25 %
Lymphs Abs: 2.3 10*3/uL (ref 0.7–4.0)
MCH: 29.6 pg (ref 26.0–34.0)
MCHC: 32.9 g/dL (ref 30.0–36.0)
MCV: 90.1 fL (ref 80.0–100.0)
Monocytes Absolute: 0.8 10*3/uL (ref 0.1–1.0)
Monocytes Relative: 9 %
Neutro Abs: 5.6 10*3/uL (ref 1.7–7.7)
Neutrophils Relative %: 61 %
Platelet Count: 195 10*3/uL (ref 150–400)
RBC: 3.92 MIL/uL (ref 3.87–5.11)
RDW: 12.9 % (ref 11.5–15.5)
WBC Count: 9.1 10*3/uL (ref 4.0–10.5)
nRBC: 0 % (ref 0.0–0.2)

## 2023-03-24 LAB — CMP (CANCER CENTER ONLY)
ALT: 8 U/L (ref 0–44)
AST: 15 U/L (ref 15–41)
Albumin: 4 g/dL (ref 3.5–5.0)
Alkaline Phosphatase: 94 U/L (ref 38–126)
Anion gap: 6 (ref 5–15)
BUN: 22 mg/dL (ref 8–23)
CO2: 25 mmol/L (ref 22–32)
Calcium: 9 mg/dL (ref 8.9–10.3)
Chloride: 107 mmol/L (ref 98–111)
Creatinine: 1.18 mg/dL — ABNORMAL HIGH (ref 0.44–1.00)
GFR, Estimated: 51 mL/min — ABNORMAL LOW (ref >60)
Glucose, Bld: 82 mg/dL (ref 70–99)
Potassium: 4 mmol/L (ref 3.5–5.1)
Sodium: 138 mmol/L (ref 135–145)
Total Bilirubin: 0.3 mg/dL (ref 0.0–1.2)
Total Protein: 6.8 g/dL (ref 6.5–8.1)

## 2023-03-24 LAB — MAGNESIUM: Magnesium: 1.7 mg/dL (ref 1.7–2.4)

## 2023-03-24 MED ORDER — IOHEXOL 300 MG/ML  SOLN
100.0000 mL | Freq: Once | INTRAMUSCULAR | Status: AC | PRN
  Administered 2023-03-24: 75 mL via INTRAVENOUS

## 2023-03-24 MED ORDER — IOHEXOL 300 MG/ML  SOLN
30.0000 mL | Freq: Once | INTRAMUSCULAR | Status: AC | PRN
  Administered 2023-03-24: 30 mL via ORAL

## 2023-03-24 MED ORDER — HEPARIN SOD (PORK) LOCK FLUSH 100 UNIT/ML IV SOLN: 500.0000 [IU] | Freq: Once | INTRAVENOUS | Status: DC

## 2023-03-24 MED ORDER — SODIUM CHLORIDE 0.9% FLUSH
10.0000 mL | Freq: Once | INTRAVENOUS | Status: AC
Start: 2023-03-24 — End: 2023-03-24
  Administered 2023-03-24: 10 mL

## 2023-03-28 ENCOUNTER — Ambulatory Visit: Payer: Medicare HMO | Admitting: Internal Medicine

## 2023-03-29 ENCOUNTER — Inpatient Hospital Stay: Payer: Medicare HMO | Attending: Oncology | Admitting: Internal Medicine

## 2023-03-29 VITALS — BP 111/70 | HR 75 | Temp 98.0°F | Resp 17 | Ht 62.0 in | Wt 113.5 lb

## 2023-03-29 DIAGNOSIS — R519 Headache, unspecified: Secondary | ICD-10-CM | POA: Diagnosis not present

## 2023-03-29 DIAGNOSIS — C3490 Malignant neoplasm of unspecified part of unspecified bronchus or lung: Secondary | ICD-10-CM | POA: Diagnosis not present

## 2023-03-29 DIAGNOSIS — Z9221 Personal history of antineoplastic chemotherapy: Secondary | ICD-10-CM | POA: Diagnosis not present

## 2023-03-29 DIAGNOSIS — R058 Other specified cough: Secondary | ICD-10-CM | POA: Insufficient documentation

## 2023-03-29 DIAGNOSIS — R0981 Nasal congestion: Secondary | ICD-10-CM | POA: Insufficient documentation

## 2023-03-29 DIAGNOSIS — C3411 Malignant neoplasm of upper lobe, right bronchus or lung: Secondary | ICD-10-CM | POA: Diagnosis not present

## 2023-03-29 DIAGNOSIS — R918 Other nonspecific abnormal finding of lung field: Secondary | ICD-10-CM | POA: Diagnosis not present

## 2023-03-29 NOTE — Progress Notes (Signed)
 [facility] [phone]   [phone]  OFFICE PROGRESS NOTE  Merna Huxley, NP [address] [place] [address]  DIAGNOSIS:  Stage IV (T2a, N0, M1a) non-small cell lung cancer, adenocarcinoma presented with multifocal bilateral pulmonary nodules involving the right upper lobe as well as the smaller bilateral groundglass opacities diagnosed in April 2023.  PD-L1 expression is 4%.  Molecular studies by [employer] 360 tissue test showed positive KRAS G12C mutation but the blood test failed secondary to insufficient circulating tumor DNA.  PRIOR THERAPY: Systemic chemotherapy with carboplatin  for AUC of 5, Alimta  500 Mg/M2 and Keytruda  200 Mg IV every 3 weeks.  First dose 08/10/2021.  Status post 8 cycles.  Starting from cycle #5 the patient is on maintenance treatment with Alimta  and Keytruda  every 3 weeks.  This treatment was discontinued secondary to disease progression      CURRENT THERAPY:  Observation, was on Krazati  (Adagrasib ) 600 mg p.o. twice daily.  First dose started 03/08/2022. Status post about 1 month of treatment. We reduced dose to 400 mg BID starting from today 04/05/22 due to intolerance. Treatment currently on hold starting from 04/15/22 due to intolerance.  INTERVAL HISTORY: Sue Chase 68 y.o. female returns to the clinic today for 68-month follow-up visit. Discussed the use of AI scribe software for clinical note transcription with the patient, who gave verbal consent to proceed.  History of Present Illness   Sue Chase is a 68 year old female with adenocarcinoma who presents for follow-up of her cancer treatment.  She has a history of adenocarcinoma diagnosed in April 2023, with a positive KRAS G12C mutation. Initial treatment included chemotherapy with carboplatin , Alimta , and Keytruda  for four cycles, followed by four additional cycles of Alimta  and Keytruda . This regimen was discontinued due to intolerance  and cancer progression. She was then switched to targeted therapy with Krazati  (adagrasib ), which was stopped after about a month in February 2024 due to intolerance.  Since her last visit a few months ago, she experiences significant congestion and 'a lot of gunk,' with occasional coughing that produces whitish phlegm, but no blood. No recent weight loss or night sweats, and she notes a weight gain of about ten pounds since November, currently weighing 113.5 pounds. No nausea, vomiting, diarrhea, headaches, or changes in vision, although she mentions mild headaches occasionally.  She recalls an episode where the side of her neck 'blew up' while eating, which she associates with a cold sore at the time. The swelling subsided, and she has a history of a clogged salivary gland at age 50.       MEDICAL HISTORY: Past Medical History:  Diagnosis Date   Cancer [facility])    Colon polyps    Complication of anesthesia    tolerated propofol  but other meds made her emotional / cry/ difficulty breathing/ panic   Hyperlipidemia    Rheumatic fever    Seasonal allergies    UTI (urinary tract infection)     ALLERGIES:  is allergic to codeine.  MEDICATIONS:  Current Outpatient Medications  Medication Sig Dispense Refill   acetaminophen  (TYLENOL ) 500 MG tablet Take 500 mg by mouth every 6 (six) hours as needed for moderate pain.     albuterol  (VENTOLIN  HFA) 108 (90 Base) MCG/ACT inhaler Inhale 2 puffs into the lungs every 6 (six) hours as needed for wheezing or shortness of breath. 8 g 5   benzonatate  (TESSALON  PERLES) 100 MG capsule 1-2 capsules up  to twice daily as needed for cough. 30 capsule 0   Budeson-Glycopyrrol-Formoterol  (BREZTRI  AEROSPHERE) 160-9-4.8 MCG/ACT AERO Inhale 2 puffs into the lungs in the morning and at bedtime. 10.7 g 5   Budeson-Glycopyrrol-Formoterol  (BREZTRI  AEROSPHERE) 160-9-4.8 MCG/ACT AERO Inhale 2 puffs into the lungs in the morning and at bedtime. (Patient not taking: Reported  on 09/13/2022)     Budeson-Glycopyrrol-Formoterol  (BREZTRI  AEROSPHERE) 160-9-4.8 MCG/ACT AERO Inhale 2 puffs into the lungs in the morning and at bedtime.     Budeson-Glycopyrrol-Formoterol  (BREZTRI  AEROSPHERE) 160-9-4.8 MCG/ACT AERO Inhale 2 puffs into the lungs in the morning and at bedtime.     Budeson-Glycopyrrol-Formoterol  (BREZTRI  AEROSPHERE) 160-9-4.8 MCG/ACT AERO INHALE TWO PUFFS BY MOUTH TWICE A DAY IN THE MORNING AND AT BEDTIME 10.7 g 6   doxycycline  (VIBRA -TABS) 100 MG tablet Take 1 tablet (100 mg total) by mouth 2 (two) times daily. 14 tablet 0   ELIQUIS  5 MG TABS tablet TAKE 1 TABLET BY MOUTH 2 TIMES A DAY 60 tablet 2   FeFum-FePoly-FA-B Cmp-C-Biot (INTEGRA PLUS ) CAPS Take 1 capsule by mouth every morning. (Patient not taking: Reported on 09/13/2022) 30 capsule 2   furosemide  (LASIX ) 40 MG tablet Take 1 tablet (40 mg total) by mouth daily as needed for edema. (Patient not taking: Reported on 06/22/2022) 30 tablet 2   hydrOXYzine  (ATARAX ) 10 MG tablet Take 1 tablet (10 mg total) by mouth 3 (three) times daily as needed for itching. (Patient not taking: Reported on 09/13/2022) 60 tablet 0   lidocaine -prilocaine  (EMLA ) cream Apply to the Port-A-Cath site 30 minutes before chemotherapy (Patient not taking: Reported on 09/13/2022) 30 g 0   megestrol  (MEGACE ) 20 MG tablet Take 1 tablet (20 mg total) by mouth daily. (Patient not taking: Reported on 06/22/2022) 30 tablet 2   omeprazole  (PRILOSEC) 20 MG capsule Take 1 capsule (20 mg total) by mouth daily. (Patient not taking: Reported on 09/13/2022) 30 capsule 3   ondansetron  (ZOFRAN -ODT) 4 MG disintegrating tablet Dissolve 1 tablet (4 mg total) by mouth every 8 (eight) hours as needed for nausea or vomiting. (Patient not taking: Reported on 09/13/2022) 20 tablet 0   potassium chloride  SA (KLOR-CON  M) 20 MEQ tablet Take 1 tablet (20 mEq total) by mouth daily. 10 tablet 0   prochlorperazine  (COMPAZINE ) 10 MG tablet Take 1 tablet (10 mg total) by mouth every 6  (six) hours as needed for nausea or vomiting. (Patient not taking: Reported on 09/13/2022) 30 tablet 0   tiZANidine  (ZANAFLEX ) 2 MG tablet Take 1 tablet (2 mg total) by mouth every 8 (eight) hours as needed for muscle spasms. 30 tablet 2   No current facility-administered medications for this visit.    SURGICAL HISTORY:  Past Surgical History:  Procedure Laterality Date   BRONCHIAL BIOPSY  06/08/2021   Procedure: BRONCHIAL BIOPSIES;  Surgeon: Brenna Adine CROME, DO;  Location: [facility];  Service: Pulmonary;;   BRONCHIAL BRUSHINGS  06/08/2021   Procedure: BRONCHIAL BRUSHINGS;  Surgeon: Brenna Adine CROME, DO;  Location: [facility];  Service: Pulmonary;;   BRONCHIAL NEEDLE ASPIRATION BIOPSY  06/08/2021   Procedure: BRONCHIAL NEEDLE ASPIRATION BIOPSIES;  Surgeon: Brenna Adine CROME, DO;  Location: [facility];  Service: Pulmonary;;   COLONOSCOPY  2018   ERCP N/A 11/05/2021   Procedure: ENDOSCOPIC RETROGRADE CHOLANGIOPANCREATOGRAPHY (ERCP);  Surgeon: Abran Norleen SAILOR, MD;  Location: [facility];  Service: Gastroenterology;  Laterality: N/A;   IR IMAGING GUIDED PORT INSERTION  08/12/2021   REMOVAL OF STONES  11/05/2021   Procedure: REMOVAL OF  STONES;  Surgeon: Abran Norleen SAILOR, MD;  Location: [facility];  Service: Gastroenterology;;   SALIVARY GLAND SURGERY     LATE 80S   SPHINCTEROTOMY  11/05/2021   Procedure: SPHINCTEROTOMY;  Surgeon: Abran Norleen SAILOR, MD;  Location: [facility];  Service: Gastroenterology;;   TUBAL LIGATION  1986   VIDEO BRONCHOSCOPY WITH RADIAL ENDOBRONCHIAL ULTRASOUND  06/08/2021   Procedure: VIDEO BRONCHOSCOPY WITH RADIAL ENDOBRONCHIAL ULTRASOUND;  Surgeon: Brenna Adine CROME, DO;  Location: [facility];  Service: Pulmonary;;    REVIEW OF SYSTEMS:  Constitutional: positive for fatigue Eyes: negative Ears, nose, mouth, throat, and face: negative Respiratory: positive for cough and sputum Cardiovascular: negative Gastrointestinal:  negative Genitourinary:negative Integument/breast: negative Hematologic/lymphatic: negative Musculoskeletal:negative Neurological: negative Behavioral/Psych: negative Endocrine: negative Allergic/Immunologic: negative   PHYSICAL EXAMINATION: General appearance: alert, cooperative, fatigued, and no distress Head: Normocephalic, without obvious abnormality, atraumatic Neck: no adenopathy, no JVD, supple, symmetrical, trachea midline, and thyroid  not enlarged, symmetric, no tenderness/mass/nodules Lymph nodes: Cervical, supraclavicular, and axillary nodes normal. Resp: clear to auscultation bilaterally Back: symmetric, no curvature. ROM normal. No CVA tenderness. Cardio: regular rate and rhythm, S1, S2 normal, no murmur, click, rub or gallop GI: soft, non-tender; bowel sounds normal; no masses,  no organomegaly Extremities: extremities normal, atraumatic, no cyanosis or edema Neurologic: Alert and oriented X 3, normal strength and tone. Normal symmetric reflexes. Normal coordination and gait  ECOG PERFORMANCE STATUS: 1 - Symptomatic but completely ambulatory  Blood pressure 111/70, pulse 75, temperature 98 F (36.7 C), temperature source Temporal, resp. rate 17, height 5' 2 (1.575 m), weight 113 lb 8 oz (51.5 kg), SpO2 99%.  LABORATORY DATA: Lab Results  Component Value Date   WBC 9.1 03/24/2023   HGB 11.6 (L) 03/24/2023   HCT 35.3 (L) 03/24/2023   MCV 90.1 03/24/2023   PLT 195 03/24/2023      Chemistry      Component Value Date/Time   NA 138 03/24/2023 1500   K 4.0 03/24/2023 1500   CL 107 03/24/2023 1500   CO2 25 03/24/2023 1500   BUN 22 03/24/2023 1500   CREATININE 1.18 (H) 03/24/2023 1500      Component Value Date/Time   CALCIUM 9.0 03/24/2023 1500   ALKPHOS 94 03/24/2023 1500   AST 15 03/24/2023 1500   ALT 8 03/24/2023 1500   BILITOT 0.3 03/24/2023 1500       RADIOGRAPHIC STUDIES: CT CHEST ABDOMEN PELVIS W CONTRAST Result Date: 03/29/2023 CLINICAL DATA:   Non-small cell lung cancer restaging * Tracking Code: BO * EXAM: CT CHEST, ABDOMEN, AND PELVIS WITH CONTRAST TECHNIQUE: Multidetector CT imaging of the chest, abdomen and pelvis was performed following the standard protocol during bolus administration of intravenous contrast. RADIATION DOSE REDUCTION: This exam was performed according to the departmental dose-optimization program which includes automated exposure control, adjustment of the mA and/or kV according to patient size and/or use of iterative reconstruction technique. CONTRAST:  30mL OMNIPAQUE  IOHEXOL  300 MG/ML SOLN, 75mL OMNIPAQUE  IOHEXOL  300 MG/ML SOLN additional oral enteric contrast COMPARISON:  12/21/2022 FINDINGS: CT CHEST FINDINGS Cardiovascular: Right chest port catheter. Aortic atherosclerosis. Unchanged enlargement tubular ascending thoracic aorta measuring up to 4.0 x 3.9 cm. Normal heart size. Left coronary artery calcifications. No pericardial effusion. Mediastinum/Nodes: No enlarged mediastinal, hilar, or axillary lymph nodes. Thyroid  gland, trachea, and esophagus demonstrate no significant findings. Lungs/Pleura: Mild centrilobular emphysema. Unchanged subsolid mass of the posterior right upper lobe abutting the major fissure measuring 2.8 x 2.6 cm (series 6, image 35). New solid nodule anteriorly  in the right upper lobe measuring 0.7 x 0.5 cm (series 6, image 38). Numerous ground-glass and small solid nodules scattered throughout the lungs are otherwise unchanged, largest in the anterior right lower lobe measuring 0.6 x 0.6 cm (series 6, image 96). Unchanged bandlike bibasilar scarring. No pleural effusion or pneumothorax. Musculoskeletal: No chest wall abnormality. No acute osseous findings. CT ABDOMEN PELVIS FINDINGS Hepatobiliary: No solid liver abnormality is seen. No gallstones, gallbladder wall thickening, or biliary dilatation. Pancreas: Unremarkable. No pancreatic ductal dilatation or surrounding inflammatory changes. Spleen: Normal  in size without significant abnormality. Adrenals/Urinary Tract: Adrenal glands are unremarkable. Kidneys are normal, without renal calculi, solid lesion, or hydronephrosis. Bladder is unremarkable. Stomach/Bowel: Stomach is within normal limits. Appendix appears normal. No evidence of bowel wall thickening, distention, or inflammatory changes. Sigmoid diverticulosis. Vascular/Lymphatic: Aortic atherosclerosis. No enlarged abdominal or pelvic lymph nodes. Reproductive: No mass or other abnormality. Other: No abdominal wall hernia or abnormality. No ascites. Musculoskeletal: No acute osseous findings. IMPRESSION: 1. Unchanged subsolid mass of the posterior right upper lobe abutting the major fissure measuring 2.8 x 2.6 cm. 2. New solid nodule anteriorly in the right upper lobe measuring 0.7 x 0.5 cm. This is nonspecific and may be infectious or inflammatory, however worrisome for a metastasis or metachronous malignancy. 3. Numerous ground-glass and small solid nodules scattered throughout the lungs are otherwise unchanged. 4. No evidence of lymphadenopathy or metastatic disease in the abdomen or pelvis. 5. Emphysema. 6. Coronary artery disease. Aortic Atherosclerosis (ICD10-I70.0) and Emphysema (ICD10-J43.9). Electronically Signed   By: Marolyn JONETTA Jaksch M.D.   On: 03/29/2023 13:53    ASSESSMENT AND PLAN: This is a very pleasant 68 years old white female diagnosed with stage IV (T2 a, N0, M1 a) non-small cell lung cancer, adenocarcinoma presented with multifocal bilateral pulmonary nodules involving the right upper lobe as well as the smaller bilateral groundglass opacities diagnosed in April 2023 with positive KRAS G12C mutation and PD-L1 expression of 4%. She underwent systemic chemotherapy with carboplatin  for AUC of 5, pemetrexed  500 Mg/M2 and Keytruda  200 Mg IV every 3 weeks status post 8 cycles.  Starting from cycle #5 the patient is on maintenance treatment with Alimta  and Keytruda  every 3 weeks. The patient  then started treatment with Krazati  (Adagrasib ) initially at 600 mg p.o. twice daily for 1 months then her dose was reduced to 400 mg p.o. twice daily but again this was discontinued secondary to intolerance and she has been on observation since that time.  The patient has been in observation since February 2024.  She is feeling fine except for productive cough of whitish sputum. She had repeat CT scan of the chest, abdomen and pelvis performed recently.  I personally and independently reviewed the scan and discussed the result with the patient today. Her scan showed no concerning findings for disease progression except for new solid nodule anteriorly in the right upper lobe measuring 0.7 x 0.5 cm that is nonspecific and may be infectious or inflammatory but worrisome for metastatic or metachronous malignancy.    Adenocarcinoma of the Lung Diagnosed in April 2023 with a positive KRAS G12C mutation and PDL1 expression of 4%. Initial treatment included carboplatin , pemetrexed  (Alimta ), and pembrolizumab  (Keytruda ) for four cycles, followed by four more cycles of Alimta  and Keytruda , which were discontinued due to intolerance and progression. Switched to adagrasib  (Krazati ) for about a month, discontinued in February 2024 due to intolerance. Recent scan shows stable disease with a new nonspecific nodule in the right upper lobe, likely  infectious or inflammatory but cannot rule out cancer. - Monitor new nodule with a follow-up scan in three months - Schedule follow-up appointment in three months  Congestion Reports significant congestion with whitish phlegm, no hemoptysis, weight gain, no nausea, vomiting, diarrhea, or night sweats. Occasional mild headaches. - Monitor symptoms and report any changes or worsening  Swollen Neck Reported significant neck swelling that resolved. Previous clogged salivary gland. Advised to consult ENT if it recurs. - Consult ENT if neck swelling recurs  General Health  Maintenance Blood work reported as good. Weight gain noted as positive. - Continue monitoring overall health and weight.   The patient was advised to call immediately if she has any concerning symptoms in the interval.  The patient voices understanding of current disease status and treatment options and is in agreement with the current care plan. All questions were answered. The patient knows to call the clinic with any problems, questions or concerns. We can certainly see the patient much sooner if necessary.  The total time spent in the appointment was 30 minutes.  Disclaimer: This note was dictated with voice recognition software. Similar sounding words can inadvertently be transcribed and may not be corrected upon review.

## 2023-04-22 ENCOUNTER — Other Ambulatory Visit: Payer: Self-pay | Admitting: Physician Assistant

## 2023-04-22 DIAGNOSIS — C3491 Malignant neoplasm of unspecified part of right bronchus or lung: Secondary | ICD-10-CM

## 2023-04-27 ENCOUNTER — Inpatient Hospital Stay: Payer: Medicare HMO | Attending: Oncology

## 2023-04-27 DIAGNOSIS — C3411 Malignant neoplasm of upper lobe, right bronchus or lung: Secondary | ICD-10-CM | POA: Insufficient documentation

## 2023-04-27 DIAGNOSIS — Z95828 Presence of other vascular implants and grafts: Secondary | ICD-10-CM

## 2023-04-27 MED ORDER — HEPARIN SOD (PORK) LOCK FLUSH 100 UNIT/ML IV SOLN
500.0000 [IU] | Freq: Once | INTRAVENOUS | Status: AC
  Administered 2023-04-27: 500 [IU]

## 2023-04-27 MED ORDER — SODIUM CHLORIDE 0.9% FLUSH
10.0000 mL | Freq: Once | INTRAVENOUS | Status: AC
  Administered 2023-04-27: 10 mL

## 2023-05-29 ENCOUNTER — Ambulatory Visit: Payer: Medicare HMO | Admitting: Primary Care

## 2023-05-29 ENCOUNTER — Ambulatory Visit (INDEPENDENT_AMBULATORY_CARE_PROVIDER_SITE_OTHER): Admitting: Primary Care

## 2023-05-29 ENCOUNTER — Encounter: Payer: Self-pay | Admitting: Primary Care

## 2023-05-29 DIAGNOSIS — R0981 Nasal congestion: Secondary | ICD-10-CM | POA: Diagnosis not present

## 2023-05-29 MED ORDER — ALBUTEROL SULFATE HFA 108 (90 BASE) MCG/ACT IN AERS: 2.0000 | INHALATION_SPRAY | Freq: Four times a day (QID) | RESPIRATORY_TRACT | 5 refills | Status: DC | PRN

## 2023-05-29 MED ORDER — BREZTRI AEROSPHERE 160-9-4.8 MCG/ACT IN AERO: 2.0000 | INHALATION_SPRAY | Freq: Two times a day (BID) | RESPIRATORY_TRACT | Status: DC

## 2023-05-29 NOTE — Progress Notes (Deleted)
 @Patient  ID: Sue Chase, female    DOB: Jul 17, 1955, 68 y.o.   MRN: [id number]  No chief complaint on file.   Referring provider: [name], NP  HPI:   . Recent scan shows stable disease with a new nonspecific nodule in the right upper lobe, likely infectious or inflammatory but cannot rule out cancer. - Monitor new nodule with a follow-up scan in three months  Allergies  Allergen Reactions   Codeine Itching    Immunization History  Administered Date(s) Administered   Fluad Quad(high Dose 65+) 02/20/2022   Influenza, Quadrivalent, Recombinant, Inj, Pf 12/15/2017, 11/17/2018   Influenza,inj,Quad PF,6+ Mos 11/27/2019   PFIZER(Purple Top)SARS-COV-2 Vaccination 05/15/2019, 06/05/2019   PNEUMOCOCCAL CONJUGATE-20 02/20/2022   Tdap 01/04/2019    Past Medical History:  Diagnosis Date   Cancer (HCC)    Colon polyps    Complication of anesthesia    tolerated propofol but other meds made her emotional / cry/ difficulty breathing/ panic   Hyperlipidemia    Rheumatic fever    Seasonal allergies    UTI (urinary tract infection)     Tobacco History: Social History   Tobacco Use  Smoking Status Some Days   Current packs/day: 1.00   Average packs/day: 1 pack/day for 40.0 years (40.0 ttl pk-yrs)   Types: Cigarettes   Passive exposure: Current  Smokeless Tobacco Never  Tobacco Comments   Patient smokes 3-4 cigarettes a week.  Patient is currently around smokers.  Updated 09/13/2022.   Ready to quit: Not Answered Counseling given: Not Answered Tobacco comments: Patient smokes 3-4 cigarettes a week.  Patient is currently around smokers.  Updated 09/13/2022.   Outpatient Medications Prior to Visit  Medication Sig Dispense Refill   acetaminophen (TYLENOL) 500 MG tablet Take 500 mg by mouth every 6 (six) hours as needed for moderate pain.     albuterol (VENTOLIN HFA) 108 (90 Base) MCG/ACT inhaler Inhale 2 puffs into the lungs every 6 (six) hours as needed for  wheezing or shortness of breath. 8 g 5   benzonatate (TESSALON PERLES) 100 MG capsule 1-2 capsules up to twice daily as needed for cough. 30 capsule 0   Budeson-Glycopyrrol-Formoterol (BREZTRI AEROSPHERE) 160-9-4.8 MCG/ACT AERO Inhale 2 puffs into the lungs in the morning and at bedtime. 10.7 g 5   Budeson-Glycopyrrol-Formoterol (BREZTRI AEROSPHERE) 160-9-4.8 MCG/ACT AERO Inhale 2 puffs into the lungs in the morning and at bedtime. (Patient not taking: Reported on 09/13/2022)     Budeson-Glycopyrrol-Formoterol (BREZTRI AEROSPHERE) 160-9-4.8 MCG/ACT AERO Inhale 2 puffs into the lungs in the morning and at bedtime.     Budeson-Glycopyrrol-Formoterol (BREZTRI AEROSPHERE) 160-9-4.8 MCG/ACT AERO Inhale 2 puffs into the lungs in the morning and at bedtime.     Budeson-Glycopyrrol-Formoterol (BREZTRI AEROSPHERE) 160-9-4.8 MCG/ACT AERO INHALE TWO PUFFS BY MOUTH TWICE A DAY IN THE MORNING AND AT BEDTIME 10.7 g 6   doxycycline (VIBRA-TABS) 100 MG tablet Take 1 tablet (100 mg total) by mouth 2 (two) times daily. 14 tablet 0   ELIQUIS 5 MG TABS tablet TAKE 1 TABLET BY MOUTH 2 TIMES A DAY 60 tablet 2   FeFum-FePoly-FA-B Cmp-C-Biot (INTEGRA PLUS) CAPS Take 1 capsule by mouth every morning. (Patient not taking: Reported on 09/13/2022) 30 capsule 2   furosemide (LASIX) 40 MG tablet Take 1 tablet (40 mg total) by mouth daily as needed for edema. (Patient not taking: Reported on 06/22/2022) 30 tablet 2   hydrOXYzine (ATARAX) 10 MG tablet Take 1 tablet (10 mg total) by mouth 3 (three)  times daily as needed for itching. (Patient not taking: Reported on 09/13/2022) 60 tablet 0   lidocaine-prilocaine (EMLA) cream Apply to the Port-A-Cath site 30 minutes before chemotherapy (Patient not taking: Reported on 09/13/2022) 30 g 0   megestrol (MEGACE) 20 MG tablet Take 1 tablet (20 mg total) by mouth daily. (Patient not taking: Reported on 06/22/2022) 30 tablet 2   omeprazole (PRILOSEC) 20 MG capsule Take 1 capsule (20 mg total) by mouth  daily. (Patient not taking: Reported on 09/13/2022) 30 capsule 3   ondansetron (ZOFRAN-ODT) 4 MG disintegrating tablet Dissolve 1 tablet (4 mg total) by mouth every 8 (eight) hours as needed for nausea or vomiting. (Patient not taking: Reported on 09/13/2022) 20 tablet 0   potassium chloride SA (KLOR-CON M) 20 MEQ tablet Take 1 tablet (20 mEq total) by mouth daily. 10 tablet 0   prochlorperazine (COMPAZINE) 10 MG tablet Take 1 tablet (10 mg total) by mouth every 6 (six) hours as needed for nausea or vomiting. (Patient not taking: Reported on 09/13/2022) 30 tablet 0   tiZANidine (ZANAFLEX) 2 MG tablet Take 1 tablet (2 mg total) by mouth every 8 (eight) hours as needed for muscle spasms. 30 tablet 2   No facility-administered medications prior to visit.      Review of Systems  Review of Systems   Physical Exam  There were no vitals taken for this visit. Physical Exam   Lab Results:  CBC    Component Value Date/Time   WBC 9.1 03/24/2023 1500   WBC 5.4 04/10/2022 1252   RBC 3.92 03/24/2023 1500   HGB 11.6 (L) 03/24/2023 1500   HCT 35.3 (L) 03/24/2023 1500   PLT 195 03/24/2023 1500   MCV 90.1 03/24/2023 1500   MCH 29.6 03/24/2023 1500   MCHC 32.9 03/24/2023 1500   RDW 12.9 03/24/2023 1500   LYMPHSABS 2.3 03/24/2023 1500   MONOABS 0.8 03/24/2023 1500   EOSABS 0.5 03/24/2023 1500   BASOSABS 0.0 03/24/2023 1500    BMET    Component Value Date/Time   NA 138 03/24/2023 1500   K 4.0 03/24/2023 1500   CL 107 03/24/2023 1500   CO2 25 03/24/2023 1500   GLUCOSE 82 03/24/2023 1500   BUN 22 03/24/2023 1500   CREATININE 1.18 (H) 03/24/2023 1500   CALCIUM 9.0 03/24/2023 1500   GFRNONAA 51 (L) 03/24/2023 1500   GFRAA >60 09/23/2019 1031    BNP    Component Value Date/Time   BNP 85.5 02/17/2022 1935    ProBNP    Component Value Date/Time   PROBNP 26.0 06/29/2017 1002    Imaging: No results found.   Assessment & Plan:   No problem-specific Assessment & Plan notes  found for this encounter.     Glenford Bayley, NP 05/29/2023

## 2023-05-29 NOTE — Progress Notes (Signed)
 @Patient  ID: Sue Chase, female    DOB: 1955/11/29, 68 y.o.   MRN: [id number]  No chief complaint on file.   Referring provider: [name], NP  HPI: 68 year old female, some day smoker (40 pack year hx). PMH significant for COPD, stage 4 adenocarcinoma right lung, DVT, hx PE, biliary obstruction, GERD, dehydration, hyperlipidemia, tobacco use.    05/29/2023 Discussed the use of AI scribe software for clinical note transcription with the patient, who gave verbal consent to proceed.  History of Present Illness   Sue Chase is a 68 year old female with COPD and a history of lung cancer who presents for a follow-up visit.  She has COPD, which is well controlled with Breztri. She uses albuterol as needed, particularly when feeling too hot or anxious, but not on a daily basis. She had a cold in September, treated with antibiotics, without significant worsening.  She continues to follow with Dr. Arbutus Ped for lung cancer. As of the last visit in July 2024, she was under observation with no active treatment. A CT scan in January 2025 showed stable disease with a new nonspecific nodule in the right upper lung lobe, which is being monitored with a follow-up CT scan scheduled for June 19, 2023. She mentions significant weight loss in the past, having dropped to 93 pounds, but currently weighs 119 pounds.  She has a history of pulmonary embolism and continues to take Eliquis twice daily. She notes that her Eliquis cost increased from $50 to $150.  She is currently on a Medicare health plan and has been receiving assistance for her medications, including Breztri, which she has been getting for free for about two years. She also takes over-the-counter loratadine for allergies, preferring the generic version due to cost.      Allergies  Allergen Reactions   Codeine Itching    Immunization History  Administered Date(s) Administered   Fluad Quad(high Dose 65+) 02/20/2022    Influenza, Quadrivalent, Recombinant, Inj, Pf 12/15/2017, 11/17/2018   Influenza,inj,Quad PF,6+ Mos 11/27/2019   PFIZER(Purple Top)SARS-COV-2 Vaccination 05/15/2019, 06/05/2019   PNEUMOCOCCAL CONJUGATE-20 02/20/2022   Tdap 01/04/2019    Past Medical History:  Diagnosis Date   Cancer (HCC)    Colon polyps    Complication of anesthesia    tolerated propofol but other meds made her emotional / cry/ difficulty breathing/ panic   Hyperlipidemia    Rheumatic fever    Seasonal allergies    UTI (urinary tract infection)     Tobacco History: Social History   Tobacco Use  Smoking Status Some Days   Current packs/day: 1.00   Average packs/day: 1 pack/day for 40.0 years (40.0 ttl pk-yrs)   Types: Cigarettes   Passive exposure: Current  Smokeless Tobacco Never  Tobacco Comments   Patient smokes 3-4 cigarettes a week.  Patient is currently around smokers.  Updated 09/13/2022.   Ready to quit: Not Answered Counseling given: Not Answered Tobacco comments: Patient smokes 3-4 cigarettes a week.  Patient is currently around smokers.  Updated 09/13/2022.   Outpatient Medications Prior to Visit  Medication Sig Dispense Refill   acetaminophen (TYLENOL) 500 MG tablet Take 500 mg by mouth every 6 (six) hours as needed for moderate pain.     albuterol (VENTOLIN HFA) 108 (90 Base) MCG/ACT inhaler Inhale 2 puffs into the lungs every 6 (six) hours as needed for wheezing or shortness of breath. 8 g 5   benzonatate (TESSALON PERLES) 100 MG capsule 1-2 capsules up to  twice daily as needed for cough. 30 capsule 0   Budeson-Glycopyrrol-Formoterol (BREZTRI AEROSPHERE) 160-9-4.8 MCG/ACT AERO Inhale 2 puffs into the lungs in the morning and at bedtime. 10.7 g 5   Budeson-Glycopyrrol-Formoterol (BREZTRI AEROSPHERE) 160-9-4.8 MCG/ACT AERO Inhale 2 puffs into the lungs in the morning and at bedtime. (Patient not taking: Reported on 09/13/2022)     Budeson-Glycopyrrol-Formoterol (BREZTRI AEROSPHERE) 160-9-4.8  MCG/ACT AERO Inhale 2 puffs into the lungs in the morning and at bedtime.     Budeson-Glycopyrrol-Formoterol (BREZTRI AEROSPHERE) 160-9-4.8 MCG/ACT AERO Inhale 2 puffs into the lungs in the morning and at bedtime.     Budeson-Glycopyrrol-Formoterol (BREZTRI AEROSPHERE) 160-9-4.8 MCG/ACT AERO INHALE TWO PUFFS BY MOUTH TWICE A DAY IN THE MORNING AND AT BEDTIME 10.7 g 6   doxycycline (VIBRA-TABS) 100 MG tablet Take 1 tablet (100 mg total) by mouth 2 (two) times daily. 14 tablet 0   ELIQUIS 5 MG TABS tablet TAKE 1 TABLET BY MOUTH 2 TIMES A DAY 60 tablet 2   FeFum-FePoly-FA-B Cmp-C-Biot (INTEGRA PLUS) CAPS Take 1 capsule by mouth every morning. (Patient not taking: Reported on 09/13/2022) 30 capsule 2   furosemide (LASIX) 40 MG tablet Take 1 tablet (40 mg total) by mouth daily as needed for edema. (Patient not taking: Reported on 06/22/2022) 30 tablet 2   hydrOXYzine (ATARAX) 10 MG tablet Take 1 tablet (10 mg total) by mouth 3 (three) times daily as needed for itching. (Patient not taking: Reported on 09/13/2022) 60 tablet 0   lidocaine-prilocaine (EMLA) cream Apply to the Port-A-Cath site 30 minutes before chemotherapy (Patient not taking: Reported on 09/13/2022) 30 g 0   megestrol (MEGACE) 20 MG tablet Take 1 tablet (20 mg total) by mouth daily. (Patient not taking: Reported on 06/22/2022) 30 tablet 2   omeprazole (PRILOSEC) 20 MG capsule Take 1 capsule (20 mg total) by mouth daily. (Patient not taking: Reported on 09/13/2022) 30 capsule 3   ondansetron (ZOFRAN-ODT) 4 MG disintegrating tablet Dissolve 1 tablet (4 mg total) by mouth every 8 (eight) hours as needed for nausea or vomiting. (Patient not taking: Reported on 09/13/2022) 20 tablet 0   potassium chloride SA (KLOR-CON M) 20 MEQ tablet Take 1 tablet (20 mEq total) by mouth daily. 10 tablet 0   prochlorperazine (COMPAZINE) 10 MG tablet Take 1 tablet (10 mg total) by mouth every 6 (six) hours as needed for nausea or vomiting. (Patient not taking: Reported on  09/13/2022) 30 tablet 0   tiZANidine (ZANAFLEX) 2 MG tablet Take 1 tablet (2 mg total) by mouth every 8 (eight) hours as needed for muscle spasms. 30 tablet 2   No facility-administered medications prior to visit.   Review of Systems  Review of Systems  Constitutional: Negative.   HENT: Negative.    Respiratory: Negative.      Physical Exam  There were no vitals taken for this visit. Physical Exam Constitutional:      Appearance: Normal appearance.  HENT:     Head: Normocephalic and atraumatic.  Cardiovascular:     Rate and Rhythm: Normal rate and regular rhythm.  Pulmonary:     Effort: Pulmonary effort is normal.     Breath sounds: Normal breath sounds. No wheezing, rhonchi or rales.  Neurological:     General: No focal deficit present.     Mental Status: She is alert and oriented to person, place, and time. Mental status is at baseline.  Psychiatric:        Mood and Affect: Mood normal.  Behavior: Behavior normal.        Thought Content: Thought content normal.        Judgment: Judgment normal.      Lab Results:  CBC    Component Value Date/Time   WBC 9.1 03/24/2023 1500   WBC 5.4 04/10/2022 1252   RBC 3.92 03/24/2023 1500   HGB 11.6 (L) 03/24/2023 1500   HCT 35.3 (L) 03/24/2023 1500   PLT 195 03/24/2023 1500   MCV 90.1 03/24/2023 1500   MCH 29.6 03/24/2023 1500   MCHC 32.9 03/24/2023 1500   RDW 12.9 03/24/2023 1500   LYMPHSABS 2.3 03/24/2023 1500   MONOABS 0.8 03/24/2023 1500   EOSABS 0.5 03/24/2023 1500   BASOSABS 0.0 03/24/2023 1500    BMET    Component Value Date/Time   NA 138 03/24/2023 1500   K 4.0 03/24/2023 1500   CL 107 03/24/2023 1500   CO2 25 03/24/2023 1500   GLUCOSE 82 03/24/2023 1500   BUN 22 03/24/2023 1500   CREATININE 1.18 (H) 03/24/2023 1500   CALCIUM 9.0 03/24/2023 1500   GFRNONAA 51 (L) 03/24/2023 1500   GFRAA >60 09/23/2019 1031    BNP    Component Value Date/Time   BNP 85.5 02/17/2022 1935    ProBNP     Component Value Date/Time   PROBNP 26.0 06/29/2017 1002    Imaging: No results found.   Assessment & Plan:   No problem-specific Assessment & Plan notes found for this encounter.  Assessment and Plan    Chronic Obstructive Pulmonary Disease (COPD) Stable; COPD symptoms controlled with Breztri. Albuterol used as needed.  - Provide Breztri samples, refill and initiate patient assistance form for [employer].  Lung Cancer Diagnosed with stage IV non-small cell lung cancer, adenocarcinoma in April 2023.  Therapy on hold as of February 2024 tolerance. Currently under observation. Following with Dr. Arbutus Ped. Recent CT in January 2025 showed unchanged subsolid mass RUL measuring 2.8 x 2.6cm h20; new nonspecific nodule, malignancy not ruled out.  - Ensure follow-up CT scan on April 28th.  Pulmonary Embolism - Hx DVT/PE dx 02/18/22  - Continue Eliquis 5mg  twice daily   Allergic rhinitis - Advised patient take over the counter loratadine as needed for allergies    Glenford Bayley, NP 05/29/2023

## 2023-05-29 NOTE — Patient Instructions (Addendum)
 -  CHRONIC OBSTRUCTIVE PULMONARY DISEASE (COPD): COPD is a chronic lung disease that makes it hard to breathe. Your symptoms are well controlled with Breztri, and you use albuterol as needed. We will continue your current medications, provide Breztri samples, and help you with a patient assistance form for [facility]. You should also use over-the-counter loratadine for allergies to save on costs.  -LUNG CANCER: Lung cancer is a type of cancer that begins in the lungs. Your lung cancer is currently under observation, and a recent CT scan showed a new nonspecific nodule. We will continue to monitor this with a follow-up CT scan on April 28th. Please continue to follow up with Dr. Arbutus Ped   -PULMONARY EMBOLISM: A pulmonary embolism is a blockage in one of the pulmonary arteries in your lungs. You are managing this condition with Eliquis, which you should continue to take twice daily. We discussed the changes in your insurance coverage and the increased cost of Eliquis. We will check for Eliquis samples to help with the cost.  -DEEP VEIN THROMBOSIS (DVT): DVT is a condition where blood clots form in deep veins, usually in the legs. Your DVT is being managed with Eliquis as part of your pulmonary embolism treatment. Continue taking Eliquis as prescribed.  INSTRUCTIONS:  Please ensure you have your follow-up CT scan on April 28th and continue to follow up with Dr. Arbutus Ped for your lung cancer. If you have any issues with your medications or their costs, please let us know so we can assist you further.  Follow-up: 6 months with new pulmonologist Dr. Delton Coombes (30 min new patient visit- former Icard) / as needed with [facility] NP for follow-up

## 2023-06-19 ENCOUNTER — Encounter (HOSPITAL_COMMUNITY): Payer: Self-pay

## 2023-06-19 ENCOUNTER — Inpatient Hospital Stay: Payer: Medicare HMO | Attending: Oncology

## 2023-06-19 ENCOUNTER — Ambulatory Visit (HOSPITAL_COMMUNITY)
Admission: RE | Admit: 2023-06-19 | Discharge: 2023-06-19 | Disposition: A | Discharge status: Home / Self Care | Source: Ambulatory Visit | Attending: Internal Medicine | Admitting: Internal Medicine

## 2023-06-19 DIAGNOSIS — J432 Centrilobular emphysema: Secondary | ICD-10-CM | POA: Diagnosis not present

## 2023-06-19 DIAGNOSIS — C3490 Malignant neoplasm of unspecified part of unspecified bronchus or lung: Secondary | ICD-10-CM | POA: Diagnosis not present

## 2023-06-19 DIAGNOSIS — K5730 Diverticulosis of large intestine without perforation or abscess without bleeding: Secondary | ICD-10-CM | POA: Diagnosis not present

## 2023-06-19 DIAGNOSIS — C3491 Malignant neoplasm of unspecified part of right bronchus or lung: Secondary | ICD-10-CM

## 2023-06-19 DIAGNOSIS — Z95828 Presence of other vascular implants and grafts: Secondary | ICD-10-CM

## 2023-06-19 DIAGNOSIS — I700 Atherosclerosis of aorta: Secondary | ICD-10-CM | POA: Diagnosis not present

## 2023-06-19 DIAGNOSIS — C3411 Malignant neoplasm of upper lobe, right bronchus or lung: Secondary | ICD-10-CM | POA: Diagnosis present

## 2023-06-19 LAB — CMP (CANCER CENTER ONLY)
ALT: 8 U/L (ref 0–44)
AST: 16 U/L (ref 15–41)
Albumin: 4.1 g/dL (ref 3.5–5.0)
Alkaline Phosphatase: 90 U/L (ref 38–126)
Anion gap: 5 (ref 5–15)
BUN: 21 mg/dL (ref 8–23)
CO2: 27 mmol/L (ref 22–32)
Calcium: 8.9 mg/dL (ref 8.9–10.3)
Chloride: 107 mmol/L (ref 98–111)
Creatinine: 1.14 mg/dL — ABNORMAL HIGH (ref 0.44–1.00)
GFR, Estimated: 53 mL/min — ABNORMAL LOW (ref >60)
Glucose, Bld: 89 mg/dL (ref 70–99)
Potassium: 3.8 mmol/L (ref 3.5–5.1)
Sodium: 139 mmol/L (ref 135–145)
Total Bilirubin: 0.3 mg/dL (ref 0.0–1.2)
Total Protein: 6.7 g/dL (ref 6.5–8.1)

## 2023-06-19 LAB — CBC WITH DIFFERENTIAL (CANCER CENTER ONLY)
Abs Immature Granulocytes: 0.04 10*3/uL (ref 0.00–0.07)
Basophils Absolute: 0.1 10*3/uL (ref 0.0–0.1)
Basophils Relative: 1 %
Eosinophils Absolute: 0.5 10*3/uL (ref 0.0–0.5)
Eosinophils Relative: 5 %
HCT: 36.1 % (ref 36.0–46.0)
Hemoglobin: 11.9 g/dL — ABNORMAL LOW (ref 12.0–15.0)
Immature Granulocytes: 0 %
Lymphocytes Relative: 27 %
Lymphs Abs: 2.5 10*3/uL (ref 0.7–4.0)
MCH: 29 pg (ref 26.0–34.0)
MCHC: 33 g/dL (ref 30.0–36.0)
MCV: 88 fL (ref 80.0–100.0)
Monocytes Absolute: 0.9 10*3/uL (ref 0.1–1.0)
Monocytes Relative: 9 %
Neutro Abs: 5.6 10*3/uL (ref 1.7–7.7)
Neutrophils Relative %: 58 %
Platelet Count: 207 10*3/uL (ref 150–400)
RBC: 4.1 MIL/uL (ref 3.87–5.11)
RDW: 13.2 % (ref 11.5–15.5)
WBC Count: 9.5 10*3/uL (ref 4.0–10.5)
nRBC: 0 % (ref 0.0–0.2)

## 2023-06-19 LAB — MAGNESIUM: Magnesium: 1.8 mg/dL (ref 1.7–2.4)

## 2023-06-19 MED ORDER — IOHEXOL 9 MG/ML PO SOLN
ORAL | Status: AC
  Filled 2023-06-19: qty 1000

## 2023-06-19 MED ORDER — IOHEXOL 9 MG/ML PO SOLN
1000.0000 mL | ORAL | Status: AC
  Administered 2023-06-19: 1000 mL via ORAL

## 2023-06-19 MED ORDER — IOHEXOL 300 MG/ML  SOLN
100.0000 mL | Freq: Once | INTRAMUSCULAR | Status: AC | PRN
  Administered 2023-06-19: 100 mL via INTRAVENOUS

## 2023-06-19 MED ORDER — SODIUM CHLORIDE (PF) 0.9 % IJ SOLN
INTRAMUSCULAR | Status: AC
  Filled 2023-06-19: qty 50

## 2023-06-19 MED ORDER — SODIUM CHLORIDE 0.9% FLUSH
10.0000 mL | Freq: Once | INTRAVENOUS | Status: AC
  Administered 2023-06-19: 10 mL

## 2023-06-19 MED ORDER — HEPARIN SOD (PORK) LOCK FLUSH 100 UNIT/ML IV SOLN
INTRAVENOUS | Status: AC
  Filled 2023-06-19: qty 5

## 2023-06-19 MED ORDER — HEPARIN SOD (PORK) LOCK FLUSH 100 UNIT/ML IV SOLN
500.0000 [IU] | Freq: Once | INTRAVENOUS | Status: AC
  Administered 2023-06-19: 500 [IU] via INTRAVENOUS

## 2023-06-23 ENCOUNTER — Telehealth: Payer: Self-pay | Admitting: Internal Medicine

## 2023-06-23 NOTE — Telephone Encounter (Signed)
 Rescheduled appointment per limited availability. The patient is aware of the appointment changes and is active on MyChart.

## 2023-06-27 ENCOUNTER — Inpatient Hospital Stay: Attending: Oncology | Admitting: Internal Medicine

## 2023-06-27 ENCOUNTER — Ambulatory Visit: Payer: Medicare HMO | Admitting: Internal Medicine

## 2023-06-27 ENCOUNTER — Other Ambulatory Visit: Payer: Self-pay | Admitting: Primary Care

## 2023-06-27 VITALS — BP 132/63 | HR 68 | Temp 98.0°F | Resp 16 | Ht 63.0 in | Wt 121.6 lb

## 2023-06-27 DIAGNOSIS — R0600 Dyspnea, unspecified: Secondary | ICD-10-CM | POA: Diagnosis not present

## 2023-06-27 DIAGNOSIS — F419 Anxiety disorder, unspecified: Secondary | ICD-10-CM | POA: Diagnosis not present

## 2023-06-27 DIAGNOSIS — C3490 Malignant neoplasm of unspecified part of unspecified bronchus or lung: Secondary | ICD-10-CM

## 2023-06-27 DIAGNOSIS — Z9221 Personal history of antineoplastic chemotherapy: Secondary | ICD-10-CM | POA: Diagnosis not present

## 2023-06-27 DIAGNOSIS — R109 Unspecified abdominal pain: Secondary | ICD-10-CM | POA: Insufficient documentation

## 2023-06-27 DIAGNOSIS — C3411 Malignant neoplasm of upper lobe, right bronchus or lung: Secondary | ICD-10-CM | POA: Insufficient documentation

## 2023-06-27 NOTE — Progress Notes (Signed)
 [name] [phone]   [phone]  OFFICE PROGRESS NOTE  [name], NP [address] [place] [address]  DIAGNOSIS:  Stage IV (T2a, N0, M1a) non-small cell lung cancer, adenocarcinoma presented with multifocal bilateral pulmonary nodules involving the right upper lobe as well as the smaller bilateral groundglass opacities diagnosed in April 2023.  PD-L1 expression is 4%.  Molecular studies by [employer] tissue test showed positive KRAS G12C mutation but the blood test failed secondary to insufficient circulating tumor DNA.  PRIOR THERAPY: Systemic chemotherapy with carboplatin  for AUC of 5, Alimta 500 Mg/M2 and Keytruda  200 Mg IV every 3 weeks.  First dose 08/10/2021.  Status post 8 cycles.  Starting from cycle #5 the patient is on maintenance treatment with Alimta and Keytruda  every 3 weeks.  This treatment was discontinued secondary to disease progression      CURRENT THERAPY:  Observation, was on Krazati  (Adagrasib ) 600 mg p.o. twice daily.  First dose started 03/08/2022. Status post about 1 month of treatment. We reduced dose to 400 mg BID starting from today 04/05/22 due to intolerance. Treatment currently on hold starting from 04/15/22 due to intolerance.  INTERVAL HISTORY: Sue Chase 68 y.o. female returns to the clinic today for 35-month follow-up visit. Discussed the use of AI scribe software for clinical note transcription with the patient, who gave verbal consent to proceed.  History of Present Illness   Sue Chase is a 68 year old female with non-small cell lung cancer who presents for evaluation with repeat CT scan for restaging of her disease.  She has a history of Stage IV (T2a, N0, M1a) non-small cell lung cancer, adenocarcinoma presented with multifocal bilateral pulmonary nodules involving the right upper lobe as well as the smaller bilateral groundglass opacities diagnosed in April 2023. She  received treatment with Chemotherapy with Carboplatin , Alimta and Keytruda  then treatment with adagrasib , which was discontinued in February 2024 due to intolerance, and she is currently under observation.  She experiences intermittent abdominal cramps that are short in duration and not associated with specific foods, except for dairy products like milkshakes. She describes the cramps as 'a cramp here, a cramp there' and notes that she sometimes feels sore from them.  No chest pain, hemoptysis, or significant dyspnea, although she mentions experiencing more breathing difficulties when she gets hot or anxious.       MEDICAL HISTORY: Past Medical History:  Diagnosis Date   Colon polyps    Complication of anesthesia    tolerated propofol  but other meds made her emotional / cry/ difficulty breathing/ panic   Hyperlipidemia    NSCL ca 2023   Rheumatic fever    Seasonal allergies    UTI (urinary tract infection)     ALLERGIES:  is allergic to codeine.  MEDICATIONS:  Current Outpatient Medications  Medication Sig Dispense Refill   acetaminophen  (TYLENOL ) 500 MG tablet Take 500 mg by mouth every 6 (six) hours as needed for moderate pain.     albuterol  (VENTOLIN  HFA) 108 (90 Base) MCG/ACT inhaler Inhale 2 puffs into the lungs every 6 (six) hours as needed for wheezing or shortness of breath. 8 g 5   benzonatate  (TESSALON  PERLES) 100 MG capsule 1-2 capsules up to twice daily as needed for cough. 30 capsule 0   budeson-glycopyrrolate-formoterol  (BREZTRI  AEROSPHERE) 160-9-4.8 MCG/ACT AERO Inhale 2 puffs into the lungs in the morning and at bedtime.     doxycycline  (VIBRA -TABS) 100 MG  tablet Take 1 tablet (100 mg total) by mouth 2 (two) times daily. 14 tablet 0   ELIQUIS  5 MG TABS tablet TAKE 1 TABLET BY MOUTH 2 TIMES A DAY 60 tablet 2   potassium chloride  SA (KLOR-CON  M) 20 MEQ tablet Take 1 tablet (20 mEq total) by mouth daily. 10 tablet 0   tiZANidine  (ZANAFLEX ) 2 MG tablet Take 1 tablet (2 mg  total) by mouth every 8 (eight) hours as needed for muscle spasms. 30 tablet 2   No current facility-administered medications for this visit.    SURGICAL HISTORY:  Past Surgical History:  Procedure Laterality Date   BRONCHIAL BIOPSY  06/08/2021   Procedure: BRONCHIAL BIOPSIES;  Surgeon: Prudy Brownie, DO;  Location: [facility];  Service: Pulmonary;;   BRONCHIAL BRUSHINGS  06/08/2021   Procedure: BRONCHIAL BRUSHINGS;  Surgeon: Prudy Brownie, DO;  Location: [facility];  Service: Pulmonary;;   BRONCHIAL NEEDLE ASPIRATION BIOPSY  06/08/2021   Procedure: BRONCHIAL NEEDLE ASPIRATION BIOPSIES;  Surgeon: Prudy Brownie, DO;  Location: [facility];  Service: Pulmonary;;   COLONOSCOPY  2018   ERCP N/A 11/05/2021   Procedure: ENDOSCOPIC RETROGRADE CHOLANGIOPANCREATOGRAPHY (ERCP);  Surgeon: Tobin Forts, MD;  Location: [facility];  Service: Gastroenterology;  Laterality: N/A;   IR IMAGING GUIDED PORT INSERTION  08/12/2021   REMOVAL OF STONES  11/05/2021   Procedure: REMOVAL OF STONES;  Surgeon: Tobin Forts, MD;  Location: [facility];  Service: Gastroenterology;;   SALIVARY GLAND SURGERY     LATE 80S   SPHINCTEROTOMY  11/05/2021   Procedure: SPHINCTEROTOMY;  Surgeon: Tobin Forts, MD;  Location: [facility];  Service: Gastroenterology;;   TUBAL LIGATION  1986   VIDEO BRONCHOSCOPY WITH RADIAL ENDOBRONCHIAL ULTRASOUND  06/08/2021   Procedure: VIDEO BRONCHOSCOPY WITH RADIAL ENDOBRONCHIAL ULTRASOUND;  Surgeon: Prudy Brownie, DO;  Location: [facility];  Service: Pulmonary;;    REVIEW OF SYSTEMS:  Constitutional: positive for fatigue Eyes: negative Ears, nose, mouth, throat, and face: negative Respiratory: positive for dyspnea on exertion Cardiovascular: negative Gastrointestinal: positive for abdominal pain Genitourinary:negative Integument/breast: negative Hematologic/lymphatic: negative Musculoskeletal:negative Neurological: negative Behavioral/Psych: negative Endocrine:  negative Allergic/Immunologic: negative   PHYSICAL EXAMINATION: General appearance: alert, cooperative, fatigued, and no distress Head: Normocephalic, without obvious abnormality, atraumatic Neck: no adenopathy, no JVD, supple, symmetrical, trachea midline, and thyroid  not enlarged, symmetric, no tenderness/mass/nodules Lymph nodes: Cervical, supraclavicular, and axillary nodes normal. Resp: clear to auscultation bilaterally Back: symmetric, no curvature. ROM normal. No CVA tenderness. Cardio: regular rate and rhythm, S1, S2 normal, no murmur, click, rub or gallop GI: soft, non-tender; bowel sounds normal; no masses,  no organomegaly Extremities: extremities normal, atraumatic, no cyanosis or edema Neurologic: Alert and oriented X 3, normal strength and tone. Normal symmetric reflexes. Normal coordination and gait  ECOG PERFORMANCE STATUS: 1 - Symptomatic but completely ambulatory  Blood pressure 132/63, pulse 68, temperature 98 F (36.7 C), temperature source Temporal, resp. rate 16, height 5\' 3"  (1.6 m), weight 121 lb 9.6 oz (55.2 kg), SpO2 100%.  LABORATORY DATA: Lab Results  Component Value Date   WBC 9.5 06/19/2023   HGB 11.9 (L) 06/19/2023   HCT 36.1 06/19/2023   MCV 88.0 06/19/2023   PLT 207 06/19/2023      Chemistry      Component Value Date/Time   NA 139 06/19/2023 1445   K 3.8 06/19/2023 1445   CL 107 06/19/2023 1445   CO2 27 06/19/2023 1445   BUN 21 06/19/2023 1445   CREATININE 1.14 (  H) 06/19/2023 1445      Component Value Date/Time   CALCIUM 8.9 06/19/2023 1445   ALKPHOS 90 06/19/2023 1445   AST 16 06/19/2023 1445   ALT 8 06/19/2023 1445   BILITOT 0.3 06/19/2023 1445       RADIOGRAPHIC STUDIES: CT ABDOMEN PELVIS W CONTRAST Result Date: 06/21/2023 EXAMINATION: CT ABDOMEN PELVIS W CONTRAST CLINICAL INDICATION: Female, 68 years old. Non-small cell lung cancer (NSCLC), staging TECHNIQUE: Axial CT of the abdomen and pelvis with 100 cc Omnipaque  300  intravenous contrast. Multiplanar reformations provided. Unless otherwise specified, incidental thyroid , adrenal, renal lesions do not require dedicated imaging follow up. Additionally, any mentioned pulmonary nodules do not require dedicated imaging follow-up based on the Fleischner guidelines unless otherwise specified. Coronary calcifications are not identified unless otherwise specified. COMPARISON: 03/24/2023 FINDINGS: Regarding findings of the lung bases, please refer to the chest report. The liver appears normal. The gallbladder is normal. The spleen is normal. The pancreas is normal. The adrenals are normal. The left kidney is normal. Right kidney demonstrates mild cortical scarring. Abdominal aorta is normal in caliber. Scattered atherosclerotic changes are present. The bladder is normal. The uterus is normal. There is colonic diverticulosis. Large and small bowel loops are otherwise within normal limits. There is no free fluid or pathologic lymphadenopathy by size criteria. There is diffuse osseous demineralization. There are degenerative changes of the spine and bony pelvis. Old L3 compression fracture noted. IMPRESSION: No evidence for metastatic disease within the abdomen or pelvis. DOSE REDUCTION: This exam was performed according to our departmental dose-optimization program which includes automated exposure control, adjustment of the mA and/or kV according to patient size and/or use of iterative reconstruction technique. Electronically signed by: [place] Engel MD 06/21/2023 04:40 PM EDT RP Workstation: [id number]    ASSESSMENT AND PLAN: This is a very pleasant 68 years old white female diagnosed with stage IV (T2 a, N0, M1 a) non-small cell lung cancer, adenocarcinoma presented with multifocal bilateral pulmonary nodules involving the right upper lobe as well as the smaller bilateral groundglass opacities diagnosed in April 2023 with positive KRAS G12C mutation and PD-L1 expression of 4%. She  underwent systemic chemotherapy with carboplatin  for AUC of 5, pemetrexed  500 Mg/M2 and Keytruda  200 Mg IV every 3 weeks status post 8 cycles.  Starting from cycle #5 the patient is on maintenance treatment with Alimta and Keytruda  every 3 weeks. The patient then started treatment with Krazati  (Adagrasib ) initially at 600 mg p.o. twice daily for 1 months then her dose was reduced to 400 mg p.o. twice daily but again this was discontinued secondary to intolerance and she has been on observation since that time.  The patient has been in observation since February 2024.  She is feeling fine except for productive cough of whitish sputum. She had repeat CT scan of the chest, abdomen and pelvis performed recently.  I personally independently reviewed the scan and discussed the result with the patient today.  There is a resolution of the previously seen right upper lobe lung nodule but the final report of the scan of the chest is still pending.    Stage IV (T2a, N0, M1a) non-small cell lung cancer, adenocarcinoma presented with multifocal bilateral pulmonary nodules involving the right upper lobe as well as the smaller bilateral groundglass opacities diagnosed in April 2023. Cancer previously treated with chemotherapy and adagrasib , which was discontinued due to intolerance. Currently under observation. Recent CT scan of the chest, abdomen, and pelvis for restaging shows no concerning findings  in the chest, and previous right lung nodules are not visible. Awaiting official radiology report for confirmation. - Await official radiology report for chest CT scan - Extend follow-up visit interval to four months - Contact her if chest CT scan shows concerning findings  Breathing issues Intermittent dyspnea exacerbated by heat and anxiety. No hemoptysis. Symptoms are situational.  Abdominal cramps Intermittent abdominal cramps with associated soreness. Not clearly associated with specific foods, though milkshakes  may cause gas. Symptoms are short and sporadic, suggesting gas-related discomfort.   The patient was advised to call immediately if she has any concerning symptoms in the interval. The patient voices understanding of current disease status and treatment options and is in agreement with the current care plan. All questions were answered. The patient knows to call the clinic with any problems, questions or concerns. We can certainly see the patient much sooner if necessary.  The total time spent in the appointment was 30 minutes.  Disclaimer: This note was dictated with voice recognition software. Similar sounding words can inadvertently be transcribed and may not be corrected upon review.

## 2023-06-27 NOTE — Telephone Encounter (Signed)
 Copied from CRM [id number]. Topic: Clinical - Medication Refill >> Jun 27, 2023 11:53 AM Isabell A wrote: Most Recent Primary Care Visit:  Provider: Maurie Southern  Department: [facility]  Visit Type: MYCHART VIDEO VISIT  Date: 11/17/2022  Medication: budeson-glycopyrrolate-formoterol  (BREZTRI  AEROSPHERE) 160-9-4.8 MCG/ACT AERO  Has the patient contacted their pharmacy? Yes (Agent: If no, request that the patient contact the pharmacy for the refill. If patient does not wish to contact the pharmacy document the reason why and proceed with request.) (Agent: If yes, when and what did the pharmacy advise?)  Is this the correct pharmacy for this prescription? Yes If no, delete pharmacy and type the correct one.  This is the patient's preferred pharmacy:  [facility] [id number] [place], [place] - [address] [place] [address] Phone: [phone] Fax: [phone]  Has the prescription been filled recently? Yes  Is the patient out of the medication? Yes  Has the patient been seen for an appointment in the last year OR does the patient have an upcoming appointment? Yes  Can we respond through MyChart? No  Agent: Please be advised that Rx refills may take up to 3 business days. We ask that you follow-up with your pharmacy.

## 2023-06-28 MED ORDER — BREZTRI AEROSPHERE 160-9-4.8 MCG/ACT IN AERO: 2.0000 | INHALATION_SPRAY | Freq: Two times a day (BID) | RESPIRATORY_TRACT | 10 refills | Status: DC

## 2023-06-30 ENCOUNTER — Telehealth: Payer: Self-pay | Admitting: Pulmonary Disease

## 2023-06-30 DIAGNOSIS — J449 Chronic obstructive pulmonary disease, unspecified: Secondary | ICD-10-CM

## 2023-06-30 MED ORDER — BREZTRI AEROSPHERE 160-9-4.8 MCG/ACT IN AERO: 2.0000 | INHALATION_SPRAY | Freq: Two times a day (BID) | RESPIRATORY_TRACT | 11 refills | Status: DC

## 2023-06-30 NOTE — Telephone Encounter (Signed)
 Copied from CRM [id number]. Topic: Clinical - Prescription Issue >> Jun 29, 2023  1:36 PM Isabell A wrote: Reason for CRM: Patient stated her prescription budeson-glycopyrrolate-formoterol  (BREZTRI  AEROSPHERE) 160-9-4.8 MCG/ACT AERO inhaler is coming up as inactive at the pharmacy - patient really needs her inhaler.    [facility] [id number] [place], [place] - [address] [place] [address] Phone: [phone]  Fax: [phone] DEA #: --   Called patient left message to call back

## 2023-06-30 NOTE — Telephone Encounter (Signed)
 Patient called back concerning the issue with prescription. Patient has been waiting for a call back . Now clinic is close but did transfer her to nurse triage . Cause she is saying she needs this inhaler before the weekend starts .

## 2023-06-30 NOTE — Telephone Encounter (Signed)
 Updated breztri  prescription sent as requested by E2C2.  Sue German, MD [facility] Office: [phone]   See Amion for personal pager PCCM on call pager [phone] until 7pm. Please call Elink 7p-7a. [phone]

## 2023-06-30 NOTE — Telephone Encounter (Signed)
 Pt calling re: Breztri  refill. Per [facility], E. Sueanne Emerald, NP is inactive at pharmacy.   [place] called Pharmacy and spoke to [facility]-- Oralee Billow reports that pharmacy is awaiting OK from [facility] for refill.

## 2023-07-03 ENCOUNTER — Telehealth: Payer: Self-pay

## 2023-07-03 NOTE — Telephone Encounter (Signed)
 Returned patient call in reference to breztri  and everything was solved and NFN

## 2023-07-03 NOTE — Telephone Encounter (Signed)
 I called and spoke to Sue Chase. Sue Chase informed of Dr Reine Caraway message and verbalized understanding. NFN

## 2023-07-03 NOTE — Telephone Encounter (Signed)
 Copied from CRM [id number]. Topic: Clinical - Medication Question >> Jun 30, 2023  2:16 PM Isabell A wrote: Reason for CRM: Patient missed phone call from  [place] - transferred to [facility]. >> Jun 30, 2023  2:20 PM Isabell A wrote: Roxan Copes currently away from desk, please call patient back.    Please advise Roxan Copes. Unsure of what patient was contacted for.

## 2023-07-21 ENCOUNTER — Other Ambulatory Visit: Payer: Self-pay | Admitting: Physician Assistant

## 2023-07-21 DIAGNOSIS — C3491 Malignant neoplasm of unspecified part of right bronchus or lung: Secondary | ICD-10-CM

## 2023-08-02 ENCOUNTER — Encounter

## 2023-08-15 ENCOUNTER — Telehealth: Payer: Self-pay

## 2023-08-15 NOTE — Telephone Encounter (Signed)
 Spoke with patient regarding port flush with lab appointment. Patient stated her last port flush was on 06/19/23. Scheduled port flush appointment for 08/16/23 at 2:15 PM.  Also noted that the patient was previously scheduled for lab work on 10/24/23 at 9:30 AM. Updated the appointment to include port flush with lab, as patient has a port. Patient voiced understanding and was advised to call with any concerns or questions.

## 2023-08-16 ENCOUNTER — Inpatient Hospital Stay: Attending: Oncology

## 2023-08-16 DIAGNOSIS — Z95828 Presence of other vascular implants and grafts: Secondary | ICD-10-CM

## 2023-08-16 DIAGNOSIS — C3411 Malignant neoplasm of upper lobe, right bronchus or lung: Secondary | ICD-10-CM | POA: Insufficient documentation

## 2023-08-16 MED ORDER — HEPARIN SOD (PORK) LOCK FLUSH 100 UNIT/ML IV SOLN
500.0000 [IU] | Freq: Once | INTRAVENOUS | Status: AC
Start: 2023-08-16 — End: 2023-08-16
  Administered 2023-08-16: 500 [IU]

## 2023-08-16 MED ORDER — SODIUM CHLORIDE 0.9% FLUSH
10.0000 mL | Freq: Once | INTRAVENOUS | Status: AC
  Administered 2023-08-16: 10 mL

## 2023-08-19 ENCOUNTER — Encounter (HOSPITAL_COMMUNITY): Payer: Self-pay | Admitting: Interventional Radiology

## 2023-09-26 DIAGNOSIS — H35372 Puckering of macula, left eye: Secondary | ICD-10-CM | POA: Diagnosis not present

## 2023-09-26 DIAGNOSIS — Z961 Presence of intraocular lens: Secondary | ICD-10-CM | POA: Diagnosis not present

## 2023-09-26 DIAGNOSIS — H26493 Other secondary cataract, bilateral: Secondary | ICD-10-CM | POA: Diagnosis not present

## 2023-09-26 DIAGNOSIS — H524 Presbyopia: Secondary | ICD-10-CM | POA: Diagnosis not present

## 2023-09-26 DIAGNOSIS — D3131 Benign neoplasm of right choroid: Secondary | ICD-10-CM | POA: Diagnosis not present

## 2023-09-26 DIAGNOSIS — H52223 Regular astigmatism, bilateral: Secondary | ICD-10-CM | POA: Diagnosis not present

## 2023-09-26 DIAGNOSIS — H43812 Vitreous degeneration, left eye: Secondary | ICD-10-CM | POA: Diagnosis not present

## 2023-09-26 DIAGNOSIS — H04123 Dry eye syndrome of bilateral lacrimal glands: Secondary | ICD-10-CM | POA: Diagnosis not present

## 2023-09-26 DIAGNOSIS — H5202 Hypermetropia, left eye: Secondary | ICD-10-CM | POA: Diagnosis not present

## 2023-09-27 ENCOUNTER — Ambulatory Visit: Payer: Self-pay | Admitting: Adult Health

## 2023-09-27 NOTE — Telephone Encounter (Signed)
 Patient to contact PCP office.  Nothing further needed.

## 2023-09-27 NOTE — Telephone Encounter (Signed)
 FYI Only or Action Required?: Action required by provider: request for appointment.  Patient is followed in Pulmonology for COPD, last seen on 05/29/2023 by Sue Chase ORN, NP.  Called Nurse Triage reporting Shortness of Breath.  Symptoms began several days ago.  Interventions attempted: Rescue inhaler and Maintenance inhaler.  Symptoms are: gradually worsening.  Triage Disposition: Go to ED Now (Notify PCP)  Patient/caregiver understands and will follow disposition?: No, wishes to speak with PCP                        Copied from CRM #[id number]. Topic: Clinical - Red Word Triage >> Sep 27, 2023  9:45 AM Sue Chase wrote: [facility] that prompted transfer to Nurse Triage: Difficulty Breathing Reason for Disposition  [1] MODERATE difficulty breathing (e.g., speaks in phrases, SOB even at rest, pulse 100-120) AND [2] NEW-onset or WORSE than normal  Answer Assessment - Initial Assessment Questions This RN recommends ED but pt refused. Pt would like to schedule an appt in office.This RN notified CAL of pt refusal of ED disposition.    RESPIRATORY STATUS: Describe your breathing? (e.g., wheezing, shortness of breath, unable to speak, severe coughing)      SOB, wheezing, productive cough  ONSET: When did this breathing problem begin?      Two days ago  PATTERN Does the difficult breathing come and go, or has it been constant since it started?      Fairly constant  SEVERITY: How bad is your breathing? (e.g., mild, moderate, severe)      I'd say all three. It just varies  RECURRENT SYMPTOM: Have you had difficulty breathing before? If Yes, ask: When was the last time? and What happened that time?      Yes, pt was in hospital one year ago for 8 days because of it  CARDIAC HISTORY: Do you have any history of heart disease? (e.g., heart attack, angina, bypass surgery, angioplasty)      No  LUNG HISTORY: Do you have any history of lung  disease?  (e.g., pulmonary embolus, asthma, emphysema)     Stage 4 lung cancer  CAUSE: What do you think is causing the breathing problem?      No, maybe the weather  OTHER SYMPTOMS: Do you have any other symptoms? (e.g., chest pain, cough, dizziness, fever, runny nose)     No cp, dizziness, fever; does have runny nose  O2 SATURATION MONITOR:  Do you use an oxygen saturation monitor (pulse oximeter) at home? If Yes, ask: What is your reading (oxygen level) today? What is your usual oxygen saturation reading? (e.g., 95%)       93%-95%  Protocols used: Breathing Difficulty-A-AH

## 2023-09-28 ENCOUNTER — Encounter: Payer: Self-pay | Admitting: Emergency Medicine

## 2023-09-28 ENCOUNTER — Ambulatory Visit: Admitting: Emergency Medicine

## 2023-09-28 ENCOUNTER — Ambulatory Visit: Payer: Self-pay | Admitting: Primary Care

## 2023-09-28 ENCOUNTER — Ambulatory Visit

## 2023-09-28 VITALS — BP 142/85 | HR 102 | Temp 98.1°F | Ht 63.0 in | Wt 130.0 lb

## 2023-09-28 DIAGNOSIS — C3491 Malignant neoplasm of unspecified part of right bronchus or lung: Secondary | ICD-10-CM | POA: Diagnosis not present

## 2023-09-28 DIAGNOSIS — R0981 Nasal congestion: Secondary | ICD-10-CM | POA: Diagnosis not present

## 2023-09-28 DIAGNOSIS — J441 Chronic obstructive pulmonary disease with (acute) exacerbation: Secondary | ICD-10-CM | POA: Diagnosis not present

## 2023-09-28 DIAGNOSIS — Z85118 Personal history of other malignant neoplasm of bronchus and lung: Secondary | ICD-10-CM | POA: Diagnosis not present

## 2023-09-28 DIAGNOSIS — J449 Chronic obstructive pulmonary disease, unspecified: Secondary | ICD-10-CM

## 2023-09-28 DIAGNOSIS — R0600 Dyspnea, unspecified: Secondary | ICD-10-CM | POA: Diagnosis not present

## 2023-09-28 MED ORDER — PREDNISONE 10 MG PO TABS: ORAL_TABLET | ORAL | 0 refills | Status: AC

## 2023-09-28 MED ORDER — AZITHROMYCIN 250 MG PO TABS
ORAL_TABLET | ORAL | 0 refills | Status: DC
Start: 2023-09-28 — End: 2023-11-30

## 2023-09-28 MED ORDER — BREZTRI AEROSPHERE 160-9-4.8 MCG/ACT IN AERO: 2.0000 | INHALATION_SPRAY | Freq: Two times a day (BID) | RESPIRATORY_TRACT | 11 refills | Status: AC

## 2023-09-28 MED ORDER — ALBUTEROL SULFATE HFA 108 (90 BASE) MCG/ACT IN AERS: 2.0000 | INHALATION_SPRAY | Freq: Four times a day (QID) | RESPIRATORY_TRACT | 5 refills | Status: AC | PRN

## 2023-09-28 NOTE — Assessment & Plan Note (Signed)
 Followed by Dr. Sherrod, currently on observation.  Has active disease based on imaging but has not been able to tolerate therapy.

## 2023-09-28 NOTE — Telephone Encounter (Signed)
 Called patient to explain symptoms,says has gotten better but still wants an appointment to be evaluated scheduled with dr,byrum for an acute visit.

## 2023-09-28 NOTE — Patient Instructions (Signed)
 Chest x-ray today Please take prednisone  as directed until completely gone. Please take azithromycin  as directed until completely gone. Continue your Breztri  2 puffs twice a day.  Rinse and gargle after using. Keep your albuterol  available to use 2 puffs when needed for shortness of breath, chest tightness, wheezing. Follow with Dr. Sherrod as planned Follow-up in our office in 1 month so we can confirm that you are improving.

## 2023-09-28 NOTE — Assessment & Plan Note (Signed)
 Chest x-ray today Please take prednisone  as directed until completely gone. Please take azithromycin  as directed until completely gone. Follow-up in our office in 1 month so we can confirm that you are improving.

## 2023-09-28 NOTE — Telephone Encounter (Signed)
 FYI Only or Action Required?: Action required by provider: refusing ED, needs call back asap, triaged yesterday and today.  Patient is followed in Pulmonology for COPD and lung cancer, last seen on 05/29/2023 by Hope Almarie ORN, NP.  Called Nurse Triage reporting Cough, Shortness of Breath, Wheezing, and Nasal Congestion.  Symptoms began several days ago.  Interventions attempted: Rescue inhaler, Maintenance inhaler, and Increased fluids/rest.  Symptoms are: gradually worsening.  Triage Disposition: Go to ED Now (Notify PCP)  Patient/caregiver understands and will follow disposition?: No, refuses disposition     Copied from CRM [id number]. Topic: Clinical - Red Word Triage >> Sep 28, 2023  8:40 AM Russell PARAS wrote: Red Word that prompted transfer to Nurse Triage:   Symptoms started 2 nights ago Wet cough, more than normally experiences with medical history of lung cancer Congestion Intermittent wheezing SOB  Transfer of Care appt in 11/2023 w/Byrum, past pt of Dr. Brenna  Reason for Disposition  History of prior blood clot in leg or lungs (i.e., deep vein thrombosis, pulmonary embolism)  Answer Assessment - Initial Assessment Questions E2C2 Pulmonary Triage - Initial Assessment Questions Chief Complaint (e.g., cough, sob, wheezing, fever, chills, sweat or additional symptoms) *Go to specific symptom protocol after initial questions. Wet cough more than usual with lung cancer Nasal congestion Intermittent wheezing SOB more than usual with moving around Not coughing fits, maybe every once in a while Confirms feeling maybe a little weaker Not mild not severe No chest pain, dizziness, numbness, headache  How long have symptoms been present? 2 nights ago  Have you used your inhalers/maintenance medication? Yes If yes, What medications? Breztri  Albuterol  inhaler, normally 2x/day, but past 2 days used it at least 3x/day, last used this morning, getting some  relief  OXYGEN: Do you wear supplemental oxygen? No  Do you monitor your oxygen levels? Yes If yes, What is your reading (oxygen level) today? Mostly 95% every once in a while down to 93-94%  What is your usual oxygen saturation reading?  (Note: Pulmonary O2 sats should be 90% or greater) 98% maybe not sure  6. CARDIAC HISTORY: Do you have any history of heart disease? (e.g., heart attack, angina, bypass surgery, angioplasty)      Hx blood clot in legs/lungs, one blood clot in leg around 2023  7. LUNG HISTORY: Do you have any history of lung disease?  (e.g., pulmonary embolus, asthma, emphysema)     Significant  Advised ED Won't go to hospital, don't feel bad enough for that  No one called back yesterday so really need call back today   Advised pt go to ED with symptoms and hx blood clot. Pt refusing ED, requesting call back asap with further recommendations since called yesterday and today. Advised pt go to ED or call back if any worsening or new symptoms.  Protocols used: Breathing Difficulty-A-AH

## 2023-09-28 NOTE — Assessment & Plan Note (Signed)
Continue your Breztri 2 puffs twice a day.  Rinse and gargle after using. Keep your albuterol available to use 2 puffs when needed for shortness of breath, chest tightness, wheezing.

## 2023-09-28 NOTE — Progress Notes (Signed)
 Subjective:    Patient ID: Sue Chase, female    DOB: May 04, 1955, 68 y.o.   MRN: [id number]  HPI Acute visit 09/28/2023  --  68 year old woman, active smoker (40 pack years) with COPD, history of stage IV adenocarcinoma of the right lung, DVT/PE, biliary obstruction, GERD, dehydration, hyperlipidemia.  She is managed on Breztri .  Currently on observation due to side effects and difficulty tolerating, has been treated with chemotherapy, immunotherapy. She was feeling ok until 2 days ago - began to have dyspnea, then URI sx. Some cough w clear mucous. Managed on Breztri , albuterol  2-3x a day. She has tried some mucinex  DM.    CT chest 06/19/23 reviewed by me shows no mediastinal or hilar adenopathy, mild centrilobular emphysema, some biapical pleural-parenchymal scarring, 3.1 x 2.5 cm posterior right upper lobe mixed attenuation mass, unchanged, stable solid right lower lobe nodule 6 mm, anterior right upper lobe solid nodule has resolved.  There are innumerable bilateral groundglass nodules bilaterally   Review of Systems As per HPI  Past Medical History:  Diagnosis Date   Colon polyps    Complication of anesthesia    tolerated propofol  but other meds made her emotional / cry/ difficulty breathing/ panic   Hyperlipidemia    NSCL ca 2023   Rheumatic fever    Seasonal allergies    UTI (urinary tract infection)      Family History  Problem Relation Age of Onset   Arthritis Mother    Diabetes Mother    Heart disease Mother    Hyperlipidemia Mother    Hypertension Mother    Miscarriages / [place] Mother    Hearing loss Father    Liver cancer Father    Lung cancer Father    Hyperlipidemia Father    Diabetes Brother    Lung disease Maternal Grandfather        Black Lung   Diabetes Paternal Grandmother    Cancer Paternal Grandfather    Diabetes Paternal Grandfather      Social History   Socioeconomic History   Marital status: Widowed    Spouse name: Not on file    Number of children: Not on file   Years of education: Not on file   Highest education level: Some college, no degree  Occupational History   Not on file  Tobacco Use   Smoking status: Some Days    Current packs/day: 1.00    Average packs/day: 1 pack/day for 40.0 years (40.0 ttl pk-yrs)    Types: Cigarettes    Passive exposure: Current   Smokeless tobacco: Never   Tobacco comments:    Patient smokes 2 cigarettes a week.  Patient is currently around smokers.  Updated 05-29-2023  Vaping Use   Vaping status: Never Used  Substance and Sexual Activity   Alcohol use: Yes    Comment: Rum and Coke/Unsure of amount   Drug use: Never   Sexual activity: Not on file  Other Topics Concern   Not on file  Social History Narrative   Married    Two grown children    She enjoys reading, walking   Social Drivers of Health   Financial Resource Strain: Medium Risk (10/18/2021)   Overall Financial Resource Strain (CARDIA)    Difficulty of Paying Living Expenses: Somewhat hard  Food Insecurity: No Food Insecurity (04/10/2022)   Hunger Vital Sign    Worried About Running Out of Food in the Last Year: Never true    Ran Out of Food in  the Last Year: Never true  Transportation Needs: No Transportation Needs (04/10/2022)   PRAPARE - Administrator, Civil Service (Medical): No    Lack of Transportation (Non-Medical): No  Physical Activity: Unknown (10/18/2021)   Exercise Vital Sign    Days of Exercise per Week: Patient declined    Minutes of Exercise per Session: Not on file  Stress: No Stress Concern Present (10/18/2021)   [employer] of Occupational Health - Occupational Stress Questionnaire    Feeling of Stress : Only a little  Social Connections: Unknown (10/18/2021)   Social Connection and Isolation Panel    Frequency of Communication with Friends and Family: Three times a week    Frequency of Social Gatherings with Friends and Family: Patient declined    Attends Religious  Services: Patient declined    Active Member of Clubs or Organizations: No    Attends Engineer, structural: Not on file    Marital Status: Widowed  Intimate Partner Violence: Not At Risk (04/10/2022)   Humiliation, Afraid, Rape, and Kick questionnaire    Fear of Current or Ex-Partner: No    Emotionally Abused: No    Physically Abused: No    Sexually Abused: No     Allergies  Allergen Reactions   Codeine Itching     Outpatient Medications Prior to Visit  Medication Sig Dispense Refill   acetaminophen  (TYLENOL ) 500 MG tablet Take 500 mg by mouth every 6 (six) hours as needed for moderate pain.     benzonatate  (TESSALON  PERLES) 100 MG capsule 1-2 capsules up to twice daily as needed for cough. 30 capsule 0   doxycycline  (VIBRA -TABS) 100 MG tablet Take 1 tablet (100 mg total) by mouth 2 (two) times daily. 14 tablet 0   ELIQUIS  5 MG TABS tablet TAKE 1 TABLET BY MOUTH 2 TIMES A DAY 60 tablet 2   potassium chloride  SA (KLOR-CON  M) 20 MEQ tablet Take 1 tablet (20 mEq total) by mouth daily. 10 tablet 0   tiZANidine  (ZANAFLEX ) 2 MG tablet Take 1 tablet (2 mg total) by mouth every 8 (eight) hours as needed for muscle spasms. 30 tablet 2   albuterol  (VENTOLIN  HFA) 108 (90 Base) MCG/ACT inhaler Inhale 2 puffs into the lungs every 6 (six) hours as needed for wheezing or shortness of breath. 8 g 5   budeson-glycopyrrolate-formoterol  (BREZTRI  AEROSPHERE) 160-9-4.8 MCG/ACT AERO inhaler Inhale 2 puffs into the lungs in the morning and at bedtime. 10.7 g 11   No facility-administered medications prior to visit.        Objective:   Physical Exam  Vitals:   09/28/23 1418  BP: (!) 142/85  Pulse: (!) 102  Temp: 98.1 F (36.7 C)  TempSrc: Temporal  SpO2: 94%  Weight: 130 lb (59 kg)  Height: 5' 3 (1.6 m)   Gen: Pleasant, well-nourished, in no distress, somewhat tearful  ENT: No lesions,  mouth clear,  oropharynx clear, no postnasal drip  Neck: No JVD, no stridor  Lungs: No use of  accessory muscles, very distant, wheeze on a forced expiration  Cardiovascular: RRR, heart sounds normal, no murmur or gallops, no peripheral edema  Musculoskeletal: No deformities, no cyanosis or clubbing  Neuro: alert, awake, non focal  Skin: Warm, no lesions or rash      Assessment & Plan:   COPD with acute exacerbation (HCC) Chest x-ray today Please take prednisone  as directed until completely gone. Please take azithromycin  as directed until completely gone. Follow-up in our office in  1 month so we can confirm that you are improving.  COPD (chronic obstructive pulmonary disease) (HCC) Continue your Breztri  2 puffs twice a day.  Rinse and gargle after using. Keep your albuterol  available to use 2 puffs when needed for shortness of breath, chest tightness, wheezing.  Adenocarcinoma of right lung, stage 4 (HCC) Followed by Dr. Sherrod, currently on observation.  Has active disease based on imaging but has not been able to tolerate therapy.   Lamar Chris, MD, PhD 09/28/2023, 2:30 PM [facility] [phone] or if no answer before 7:00PM call [phone] For any issues after 7:00PM please call eLink [phone]

## 2023-10-19 ENCOUNTER — Other Ambulatory Visit: Payer: Self-pay | Admitting: Physician Assistant

## 2023-10-19 DIAGNOSIS — C3491 Malignant neoplasm of unspecified part of right bronchus or lung: Secondary | ICD-10-CM

## 2023-10-24 ENCOUNTER — Telehealth: Payer: Self-pay | Admitting: Internal Medicine

## 2023-10-24 ENCOUNTER — Other Ambulatory Visit: Payer: Self-pay | Admitting: Internal Medicine

## 2023-10-24 ENCOUNTER — Ambulatory Visit (HOSPITAL_COMMUNITY)
Admission: RE | Admit: 2023-10-24 | Discharge: 2023-10-24 | Disposition: A | Discharge status: Home / Self Care | Source: Ambulatory Visit | Attending: Internal Medicine | Admitting: Internal Medicine

## 2023-10-24 ENCOUNTER — Inpatient Hospital Stay: Attending: Oncology

## 2023-10-24 ENCOUNTER — Other Ambulatory Visit

## 2023-10-24 ENCOUNTER — Inpatient Hospital Stay

## 2023-10-24 DIAGNOSIS — J441 Chronic obstructive pulmonary disease with (acute) exacerbation: Secondary | ICD-10-CM | POA: Insufficient documentation

## 2023-10-24 DIAGNOSIS — C3411 Malignant neoplasm of upper lobe, right bronchus or lung: Secondary | ICD-10-CM | POA: Insufficient documentation

## 2023-10-24 DIAGNOSIS — Z95828 Presence of other vascular implants and grafts: Secondary | ICD-10-CM

## 2023-10-24 DIAGNOSIS — C3490 Malignant neoplasm of unspecified part of unspecified bronchus or lung: Secondary | ICD-10-CM

## 2023-10-24 DIAGNOSIS — I700 Atherosclerosis of aorta: Secondary | ICD-10-CM | POA: Diagnosis not present

## 2023-10-24 DIAGNOSIS — Z9221 Personal history of antineoplastic chemotherapy: Secondary | ICD-10-CM | POA: Insufficient documentation

## 2023-10-24 DIAGNOSIS — J984 Other disorders of lung: Secondary | ICD-10-CM | POA: Diagnosis not present

## 2023-10-24 LAB — CMP (CANCER CENTER ONLY)
ALT: 11 U/L (ref 0–44)
AST: 17 U/L (ref 15–41)
Albumin: 3.8 g/dL (ref 3.5–5.0)
Alkaline Phosphatase: 91 U/L (ref 38–126)
Anion gap: 7 (ref 5–15)
BUN: 22 mg/dL (ref 8–23)
CO2: 25 mmol/L (ref 22–32)
Calcium: 9 mg/dL (ref 8.9–10.3)
Chloride: 108 mmol/L (ref 98–111)
Creatinine: 1.25 mg/dL — ABNORMAL HIGH (ref 0.44–1.00)
GFR, Estimated: 47 mL/min — ABNORMAL LOW (ref >60)
Glucose, Bld: 94 mg/dL (ref 70–99)
Potassium: 4.3 mmol/L (ref 3.5–5.1)
Sodium: 140 mmol/L (ref 135–145)
Total Bilirubin: 0.3 mg/dL (ref 0.0–1.2)
Total Protein: 6.6 g/dL (ref 6.5–8.1)

## 2023-10-24 LAB — CBC WITH DIFFERENTIAL (CANCER CENTER ONLY)
Abs Immature Granulocytes: 0.03 K/uL (ref 0.00–0.07)
Basophils Absolute: 0 K/uL (ref 0.0–0.1)
Basophils Relative: 0 %
Eosinophils Absolute: 0.4 K/uL (ref 0.0–0.5)
Eosinophils Relative: 4 %
HCT: 35.1 % — ABNORMAL LOW (ref 36.0–46.0)
Hemoglobin: 11.2 g/dL — ABNORMAL LOW (ref 12.0–15.0)
Immature Granulocytes: 0 %
Lymphocytes Relative: 24 %
Lymphs Abs: 1.9 K/uL (ref 0.7–4.0)
MCH: 28.1 pg (ref 26.0–34.0)
MCHC: 31.9 g/dL (ref 30.0–36.0)
MCV: 88.2 fL (ref 80.0–100.0)
Monocytes Absolute: 0.7 K/uL (ref 0.1–1.0)
Monocytes Relative: 8 %
Neutro Abs: 5 K/uL (ref 1.7–7.7)
Neutrophils Relative %: 64 %
Platelet Count: 217 K/uL (ref 150–400)
RBC: 3.98 MIL/uL (ref 3.87–5.11)
RDW: 14.2 % (ref 11.5–15.5)
WBC Count: 7.9 K/uL (ref 4.0–10.5)
nRBC: 0 % (ref 0.0–0.2)

## 2023-10-24 MED ORDER — HEPARIN SOD (PORK) LOCK FLUSH 100 UNIT/ML IV SOLN
INTRAVENOUS | Status: AC
  Filled 2023-10-24: qty 5

## 2023-10-24 MED ORDER — HEPARIN SOD (PORK) LOCK FLUSH 100 UNIT/ML IV SOLN
500.0000 [IU] | Freq: Once | INTRAVENOUS | Status: AC
  Administered 2023-10-24: 500 [IU] via INTRAVENOUS

## 2023-10-24 MED ORDER — IOHEXOL 9 MG/ML PO SOLN
500.0000 mL | ORAL | Status: AC
  Administered 2023-10-24 (×2): 500 mL via ORAL

## 2023-10-24 MED ORDER — SODIUM CHLORIDE 0.9% FLUSH
10.0000 mL | Freq: Once | INTRAVENOUS | Status: AC
  Administered 2023-10-24: 10 mL

## 2023-10-24 MED ORDER — IOHEXOL 300 MG/ML  SOLN
100.0000 mL | Freq: Once | INTRAMUSCULAR | Status: AC | PRN
  Administered 2023-10-24: 100 mL via INTRAVENOUS

## 2023-10-31 ENCOUNTER — Inpatient Hospital Stay (HOSPITAL_BASED_OUTPATIENT_CLINIC_OR_DEPARTMENT_OTHER): Admitting: Internal Medicine

## 2023-10-31 VITALS — BP 113/67 | HR 78 | Temp 97.2°F | Resp 17 | Ht 63.0 in | Wt 139.3 lb

## 2023-10-31 DIAGNOSIS — C3411 Malignant neoplasm of upper lobe, right bronchus or lung: Secondary | ICD-10-CM | POA: Diagnosis not present

## 2023-10-31 DIAGNOSIS — C3490 Malignant neoplasm of unspecified part of unspecified bronchus or lung: Secondary | ICD-10-CM | POA: Diagnosis not present

## 2023-10-31 DIAGNOSIS — Z9221 Personal history of antineoplastic chemotherapy: Secondary | ICD-10-CM | POA: Diagnosis not present

## 2023-10-31 NOTE — Progress Notes (Signed)
 [facility] [phone]   [phone]  OFFICE PROGRESS NOTE  Sue Huxley, NP [address] [place] [address]  DIAGNOSIS:  Stage IV (T2a, N0, M1a) non-small cell lung cancer, adenocarcinoma presented with multifocal bilateral pulmonary nodules involving the right upper lobe as well as the smaller bilateral groundglass opacities diagnosed in April 2023.  PD-L1 expression is 4%.  Molecular studies by [employer] 360 tissue test showed positive KRAS G12C mutation but the blood test failed secondary to insufficient circulating tumor DNA.  PRIOR THERAPY: Systemic chemotherapy with carboplatin  for AUC of 5, Alimta  500 Mg/M2 and Keytruda  200 Mg IV every 3 weeks.  First dose 08/10/2021.  Status post 8 cycles.  Starting from cycle #5 the patient is on maintenance treatment with Alimta  and Keytruda  every 3 weeks.  This treatment was discontinued secondary to disease progression      CURRENT THERAPY:  Observation, was on Krazati  (Adagrasib ) 600 mg p.o. twice daily.  First dose started 03/08/2022. Status post about 1 month of treatment. We reduced dose to 400 mg BID starting from today 04/05/22 due to intolerance. Treatment currently on hold starting from 04/15/22 due to intolerance.  INTERVAL HISTORY: Sue Chase 68 y.o. female returns to the clinic today for 33-month follow-up visit.  Discussed the use of AI scribe software for clinical note transcription with the patient, who gave verbal consent to proceed.  History of Present Illness Sue Chase is a 68 year old female with stage IV non-small cell lung cancer who presents for evaluation and repeat CT scan of the chest, abdomen, and pelvis.  She was diagnosed with adenocarcinoma in April 2023, characterized by a KRAS G12C mutation and PD-L1 expression of 4%. She initially underwent systemic chemoimmunotherapy for one month at the standard dose, which was then reduced to 400 mg twice a day.  Treatment was discontinued in February 2024 due to intolerance, and she has been on observation since then.  She recently experienced an exacerbation of COPD and consulted a new pulmonologist after her previous doctor left. She was treated with prednisone  and antibiotics, which improved her condition. She notes weight gain, attributing it partly to prednisone  use, and experiences shortness of breath.  No chest pain, nausea, vomiting, diarrhea, or headaches.    MEDICAL HISTORY: Past Medical History:  Diagnosis Date   Colon polyps    Complication of anesthesia    tolerated propofol  but other meds made her emotional / cry/ difficulty breathing/ panic   Hyperlipidemia    NSCL ca 2023   Rheumatic fever    Seasonal allergies    UTI (urinary tract infection)     ALLERGIES:  is allergic to codeine.  MEDICATIONS:  Current Outpatient Medications  Medication Sig Dispense Refill   acetaminophen  (TYLENOL ) 500 MG tablet Take 500 mg by mouth every 6 (six) hours as needed for moderate pain.     albuterol  (VENTOLIN  HFA) 108 (90 Base) MCG/ACT inhaler Inhale 2 puffs into the lungs every 6 (six) hours as needed for wheezing or shortness of breath. 8 g 5   azithromycin  (ZITHROMAX ) 250 MG tablet Take 2 on the first day, then take 1 daily until completely gone 6 tablet 0   benzonatate  (TESSALON  PERLES) 100 MG capsule 1-2 capsules up to twice daily as needed for cough. 30 capsule 0   budesonide-glycopyrrolate-formoterol  (BREZTRI  AEROSPHERE) 160-9-4.8 MCG/ACT AERO inhaler Inhale 2 puffs into the lungs in the morning and at bedtime. 10.7 g 11  doxycycline  (VIBRA -TABS) 100 MG tablet Take 1 tablet (100 mg total) by mouth 2 (two) times daily. 14 tablet 0   ELIQUIS  5 MG TABS tablet TAKE 1 TABLET BY MOUTH 2 TIMES A DAY 60 tablet 2   potassium chloride  SA (KLOR-CON  M) 20 MEQ tablet Take 1 tablet (20 mEq total) by mouth daily. 10 tablet 0   predniSONE  (DELTASONE ) 10 MG tablet Take 40mg  daily for 3 days, then 30mg   daily for 3 days, then 20mg  daily for 3 days, then 10mg  daily for 3 days, then stop 30 tablet 0   tiZANidine  (ZANAFLEX ) 2 MG tablet Take 1 tablet (2 mg total) by mouth every 8 (eight) hours as needed for muscle spasms. 30 tablet 2   No current facility-administered medications for this visit.    SURGICAL HISTORY:  Past Surgical History:  Procedure Laterality Date   BRONCHIAL BIOPSY  06/08/2021   Procedure: BRONCHIAL BIOPSIES;  Surgeon: Brenna Adine CROME, DO;  Location: [facility];  Service: Pulmonary;;   BRONCHIAL BRUSHINGS  06/08/2021   Procedure: BRONCHIAL BRUSHINGS;  Surgeon: Brenna Adine CROME, DO;  Location: [facility];  Service: Pulmonary;;   BRONCHIAL NEEDLE ASPIRATION BIOPSY  06/08/2021   Procedure: BRONCHIAL NEEDLE ASPIRATION BIOPSIES;  Surgeon: Brenna Adine CROME, DO;  Location: [facility];  Service: Pulmonary;;   COLONOSCOPY  2018   ERCP N/A 11/05/2021   Procedure: ENDOSCOPIC RETROGRADE CHOLANGIOPANCREATOGRAPHY (ERCP);  Surgeon: Abran Norleen SAILOR, MD;  Location: [facility];  Service: Gastroenterology;  Laterality: N/A;   IR IMAGING GUIDED PORT INSERTION  08/12/2021   REMOVAL OF STONES  11/05/2021   Procedure: REMOVAL OF STONES;  Surgeon: Abran Norleen SAILOR, MD;  Location: [facility];  Service: Gastroenterology;;   SALIVARY GLAND SURGERY     LATE 80S   SPHINCTEROTOMY  11/05/2021   Procedure: SPHINCTEROTOMY;  Surgeon: Abran Norleen SAILOR, MD;  Location: [facility];  Service: Gastroenterology;;   TUBAL LIGATION  1986   VIDEO BRONCHOSCOPY WITH RADIAL ENDOBRONCHIAL ULTRASOUND  06/08/2021   Procedure: VIDEO BRONCHOSCOPY WITH RADIAL ENDOBRONCHIAL ULTRASOUND;  Surgeon: Brenna Adine CROME, DO;  Location: [facility];  Service: Pulmonary;;    REVIEW OF SYSTEMS:  A comprehensive review of systems was negative except for: Constitutional: positive for fatigue Respiratory: positive for dyspnea on exertion   PHYSICAL EXAMINATION: General appearance: alert, cooperative, fatigued, and no distress Head:  Normocephalic, without obvious abnormality, atraumatic Neck: no adenopathy, no JVD, supple, symmetrical, trachea midline, and thyroid  not enlarged, symmetric, no tenderness/mass/nodules Lymph nodes: Cervical, supraclavicular, and axillary nodes normal. Resp: clear to auscultation bilaterally Back: symmetric, no curvature. ROM normal. No CVA tenderness. Cardio: regular rate and rhythm, S1, S2 normal, no murmur, click, rub or gallop GI: soft, non-tender; bowel sounds normal; no masses,  no organomegaly Extremities: extremities normal, atraumatic, no cyanosis or edema  ECOG PERFORMANCE STATUS: 1 - Symptomatic but completely ambulatory  Blood pressure 113/67, pulse 78, temperature (!) 97.2 F (36.2 C), resp. rate 17, height 5' 3 (1.6 m), weight 139 lb 4.8 oz (63.2 kg), SpO2 98%.  LABORATORY DATA: Lab Results  Component Value Date   WBC 7.9 10/24/2023   HGB 11.2 (L) 10/24/2023   HCT 35.1 (L) 10/24/2023   MCV 88.2 10/24/2023   PLT 217 10/24/2023      Chemistry      Component Value Date/Time   NA 140 10/24/2023 1200   K 4.3 10/24/2023 1200   CL 108 10/24/2023 1200   CO2 25 10/24/2023 1200   BUN 22 10/24/2023 1200   CREATININE  1.25 (H) 10/24/2023 1200      Component Value Date/Time   CALCIUM 9.0 10/24/2023 1200   ALKPHOS 91 10/24/2023 1200   AST 17 10/24/2023 1200   ALT 11 10/24/2023 1200   BILITOT 0.3 10/24/2023 1200       RADIOGRAPHIC STUDIES: CT CHEST ABDOMEN PELVIS W CONTRAST Result Date: 10/30/2023 CLINICAL DATA:  Restaging non-small cell lung cancer. * Tracking Code: BO * EXAM: CT CHEST, ABDOMEN, AND PELVIS WITH CONTRAST TECHNIQUE: Multidetector CT imaging of the chest, abdomen and pelvis was performed following the standard protocol during bolus administration of intravenous contrast. RADIATION DOSE REDUCTION: This exam was performed according to the departmental dose-optimization program which includes automated exposure control, adjustment of the mA and/or kV according  to patient size and/or use of iterative reconstruction technique. CONTRAST:  OMNIPAQUE  IOHEXOL  300 MG/ML  SOLN COMPARISON:  Multiple prior CT scans. The most recent is 06/19/2023. FINDINGS: CT CHEST FINDINGS Cardiovascular: The heart is normal in size. No pericardial effusion. The aorta is within normal limits in caliber and stable. No dissection. Stable scattered atherosclerotic calcifications. Stable scattered coronary artery calcifications. The right IJ Port-A-Cath is in good position without complicating features. Mediastinum/Nodes: Small scattered mediastinal and hilar lymph nodes are stable. No new or progressive findings. The esophagus is unremarkable. Lungs/Pleura: Stable large difficult to measure heterogeneous masslike area ground-glass attenuation interstitial thickening and bronchiectasis in the right upper lobe. Best estimate is 3.2 x 3.0 cm. This previously measured 3.1 x 2.5 cm. Stable retraction of the major fissure. Very ill-defined area attenuation and bronchiectasis in the right lower lobe is very difficult to measure but appears unchanged. Innumerable scattered ground-glass nodules in both lungs are again demonstrated. Index lesion in the right upper lobe on image 55/4 measures 11 mm and is unchanged. More solid/semi-solid nodule in the right lower lobe anteriorly on image number 98/4 measures 6 x 5 mm and is unchanged. Musculoskeletal: No breast masses, supraclavicular axillary adenopathy. The bony thorax is intact. No bone lesions are identified. Stable mild compression deformities of T9, L1 and L3. CT ABDOMEN PELVIS FINDINGS Hepatobiliary: No focal liver abnormality is seen. No gallstones, gallbladder wall thickening, or biliary dilatation. Pancreas: Unremarkable. No pancreatic ductal dilatation or surrounding inflammatory changes. Spleen: Normal in size without focal abnormality. Adrenals/Urinary Tract: The adrenal glands are normal. No renal lesions or hydronephrosis. Stable upper lobe  cortical scarring changes involving right kidney. The bladder is unremarkable. Stomach/Bowel: The stomach, duodenum, small bowel and colon are grossly normal without oral contrast. No inflammatory changes, mass lesions or obstructive findings. Stable colonic diverticulosis. Vascular/Lymphatic: No significant vascular findings are present. Stable atherosclerotic calcifications involving the aorta and branch vessels. No enlarged abdominal or pelvic lymph nodes. Reproductive: The uterus is unremarkable.  No adnexal mass. Other: No pelvic mass or adenopathy. No free pelvic fluid collections. No inguinal mass or adenopathy. Small periumbilical abdominal wall hernia containing fat. Musculoskeletal: No significant bony findings. Remote L1 and L3 compression fractures. IMPRESSION: 1. Stable large heterogeneous masslike area of ground-glass attenuation, interstitial thickening and bronchiectasis in the right upper lobe. 2. Stable innumerable scattered ground-glass and subsolid nodules in both lungs. No new or progressive findings. 3. No mediastinal or hilar mass or adenopathy. 4. No findings for abdominal/pelvic metastatic disease. 5. Stable atherosclerotic changes. Aortic Atherosclerosis (ICD10-I70.0). Electronically Signed   By: MYRTIS Stammer M.D.   On: 10/30/2023 16:54    ASSESSMENT AND PLAN: This is a very pleasant 68 years old white female diagnosed with stage IV (T2 a,  N0, M1 a) non-small cell lung cancer, adenocarcinoma presented with multifocal bilateral pulmonary nodules involving the right upper lobe as well as the smaller bilateral groundglass opacities diagnosed in April 2023 with positive KRAS G12C mutation and PD-L1 expression of 4%. She underwent systemic chemotherapy with carboplatin  for AUC of 5, pemetrexed  500 Mg/M2 and Keytruda  200 Mg IV every 3 weeks status post 8 cycles.  Starting from cycle #5 the patient is on maintenance treatment with Alimta  and Keytruda  every 3 weeks. The patient then started  treatment with Krazati  (Adagrasib ) initially at 600 mg p.o. twice daily for 1 months then her dose was reduced to 400 mg p.o. twice daily but again this was discontinued secondary to intolerance and she has been on observation since that time.  The patient had repeat CT scan of the chest, abdomen and pelvis performed recently.  I personally independently reviewed the scan and discussed the result with the patient today.  Her scan showed no concerning findings for disease progression. Assessment and Plan Assessment & Plan Stage IV non-small cell lung cancer, adenocarcinoma, KRAS G12C positive Stage IV non-small cell lung cancer, adenocarcinoma, with KRAS G12C mutation and PD-L1 expression of 4%. Diagnosed in April 2023. Previously on systemic chemoimmunotherapy, discontinued in February 2024 due to intolerance. Currently on observation. Recent CT scan of chest, abdomen, and pelvis shows stable disease with no evidence of growth or spread. - Continue observation - Schedule follow-up CT scan of chest, abdomen, and pelvis one week prior to next appointment  Chronic obstructive pulmonary disease (COPD) Recent exacerbation managed with prednisone  and antibiotics prescribed by a new pulmonologist. Symptoms improved with treatment. Currently experiencing shortness of breath, likely exacerbated by weight gain and previous prednisone  use. - Contact healthcare provider if symptoms worsen She was advised to call immediately if she has any other concerning symptoms in the interval.  The patient voices understanding of current disease status and treatment options and is in agreement with the current care plan. All questions were answered. The patient knows to call the clinic with any problems, questions or concerns. We can certainly see the patient much sooner if necessary.  The total time spent in the appointment was 20 minutes.  Disclaimer: This note was dictated with voice recognition software. Similar  sounding words can inadvertently be transcribed and may not be corrected upon review.

## 2023-11-21 ENCOUNTER — Encounter: Payer: Self-pay | Admitting: Emergency Medicine

## 2023-11-29 ENCOUNTER — Encounter: Payer: Self-pay | Admitting: Internal Medicine

## 2023-11-30 ENCOUNTER — Ambulatory Visit: Admitting: Emergency Medicine

## 2023-11-30 ENCOUNTER — Encounter: Payer: Self-pay | Admitting: Emergency Medicine

## 2023-11-30 ENCOUNTER — Encounter: Admitting: Emergency Medicine

## 2023-11-30 VITALS — BP 126/78 | HR 80 | Temp 97.9°F | Ht 63.0 in | Wt 138.2 lb

## 2023-11-30 DIAGNOSIS — Z720 Tobacco use: Secondary | ICD-10-CM | POA: Diagnosis not present

## 2023-11-30 DIAGNOSIS — J449 Chronic obstructive pulmonary disease, unspecified: Secondary | ICD-10-CM | POA: Diagnosis not present

## 2023-11-30 DIAGNOSIS — C3491 Malignant neoplasm of unspecified part of right bronchus or lung: Secondary | ICD-10-CM | POA: Diagnosis not present

## 2023-11-30 DIAGNOSIS — Z23 Encounter for immunization: Secondary | ICD-10-CM | POA: Diagnosis not present

## 2023-11-30 DIAGNOSIS — Z86711 Personal history of pulmonary embolism: Secondary | ICD-10-CM

## 2023-11-30 MED ORDER — AZITHROMYCIN 250 MG PO TABS: 250.0000 mg | ORAL_TABLET | Freq: Every day | ORAL | 3 refills | Status: DC

## 2023-11-30 NOTE — Progress Notes (Signed)
 Subjective:    Patient ID: Sue Chase, female    DOB: [birth year], 68 y.o.   MRN: [id number]  HPI  ROV 11/30/2023 --follow-up visit for 68 year old woman with a history of active tobacco use, COPD, stage IV adenocarcinoma of the right lung, DVT/PE.  I saw her in August when she was experiencing an acute exacerbation.  Treated with prednisone  and azithromycin . She is on Breztri .  She is currently on observation and is being followed by Dr. Sherrod. She has some improvement in her breathing, is dealing with cough. She developed some URI sx about 2 weeks ago. She is smoking about 2-3 cig a week. Has clear mucous. She uses albuterol  about 2-4x a day.   CT chest/abdomen/pelvis 10/24/2023 reviewed by me, shows stable large heterogeneous masslike area of ground glass attenuation and interstitial thickening with bronchiectatic change in the right upper lobe approximately 3.2 x 3.0 cm with associated retraction of the major fissure.  Also innumerable scattered ground glass nodules bilaterally without significant progression.  No mediastinal adenopathy.  She is currently on observation and is being followed by Dr. Sherrod.   Review of Systems As per HPI  Past Medical History:  Diagnosis Date   Colon polyps    Complication of anesthesia    tolerated propofol  but other meds made her emotional / cry/ difficulty breathing/ panic   Hyperlipidemia    NSCL ca 2023   Rheumatic fever    Seasonal allergies    UTI (urinary tract infection)      Family History  Problem Relation Age of Onset   Arthritis Mother    Diabetes Mother    Heart disease Mother    Hyperlipidemia Mother    Hypertension Mother    Miscarriages / [place] Mother    Hearing loss Father    Liver cancer Father    Lung cancer Father    Hyperlipidemia Father    Diabetes Brother    Lung disease Maternal Grandfather        Black Lung   Diabetes Paternal Grandmother    Cancer Paternal Grandfather    Diabetes Paternal  Grandfather      Social History   Socioeconomic History   Marital status: Widowed    Spouse name: Not on file   Number of children: Not on file   Years of education: Not on file   Highest education level: Some college, no degree  Occupational History   Not on file  Tobacco Use   Smoking status: Some Days    Current packs/day: 1.00    Average packs/day: 1 pack/day for 40.0 years (40.0 ttl pk-yrs)    Types: Cigarettes    Passive exposure: Current   Smokeless tobacco: Never   Tobacco comments:    Patient smokes 2 cigarettes a week.  Patient is currently around smokers.  Updated 05-29-2023    Trying to quit.  Smoked 1 last week.  11/30/2023 hfb RN  Vaping Use   Vaping status: Never Used  Substance and Sexual Activity   Alcohol use: Yes    Comment: Rum and Coke/Unsure of amount   Drug use: Never   Sexual activity: Not on file  Other Topics Concern   Not on file  Social History Narrative   Married    Two grown children    She enjoys reading, walking   Social Drivers of Health   Financial Resource Strain: Medium Risk (10/18/2021)   Overall Financial Resource Strain (CARDIA)    Difficulty of Paying Living Expenses:  Somewhat hard  Food Insecurity: No Food Insecurity (04/10/2022)   Hunger Vital Sign    Worried About Running Out of Food in the Last Year: Never true    Ran Out of Food in the Last Year: Never true  Transportation Needs: No Transportation Needs (04/10/2022)   PRAPARE - Administrator, Civil Service (Medical): No    Lack of Transportation (Non-Medical): No  Physical Activity: Unknown (10/18/2021)   Exercise Vital Sign    Days of Exercise per Week: Patient declined    Minutes of Exercise per Session: Not on file  Stress: No Stress Concern Present (10/18/2021)   [employer] of Occupational Health - Occupational Stress Questionnaire    Feeling of Stress : Only a little  Social Connections: Unknown (10/18/2021)   Social Connection and Isolation Panel     Frequency of Communication with Friends and Family: Three times a week    Frequency of Social Gatherings with Friends and Family: Patient declined    Attends Religious Services: Patient declined    Active Member of Clubs or Organizations: No    Attends Engineer, structural: Not on file    Marital Status: Widowed  Intimate Partner Violence: Not At Risk (04/10/2022)   Humiliation, Afraid, Rape, and Kick questionnaire    Fear of Current or Ex-Partner: No    Emotionally Abused: No    Physically Abused: No    Sexually Abused: No     Allergies  Allergen Reactions   Codeine Itching     Outpatient Medications Prior to Visit  Medication Sig Dispense Refill   acetaminophen  (TYLENOL ) 500 MG tablet Take 500 mg by mouth every 6 (six) hours as needed for moderate pain.     albuterol  (VENTOLIN  HFA) 108 (90 Base) MCG/ACT inhaler Inhale 2 puffs into the lungs every 6 (six) hours as needed for wheezing or shortness of breath. 8 g 5   budesonide-glycopyrrolate-formoterol  (BREZTRI  AEROSPHERE) 160-9-4.8 MCG/ACT AERO inhaler Inhale 2 puffs into the lungs in the morning and at bedtime. 10.7 g 11   ELIQUIS  5 MG TABS tablet TAKE 1 TABLET BY MOUTH 2 TIMES A DAY 60 tablet 2   loratadine (CLARITIN) 5 MG chewable tablet Chew 5 mg by mouth daily.     Magnesium  400 MG TABS Take 400 mg by mouth daily.     OVER THE COUNTER MEDICATION Take 10 mEq by mouth daily.     predniSONE  (DELTASONE ) 10 MG tablet Take 40mg  daily for 3 days, then 30mg  daily for 3 days, then 20mg  daily for 3 days, then 10mg  daily for 3 days, then stop (Patient not taking: Reported on 11/30/2023) 30 tablet 0   azithromycin  (ZITHROMAX ) 250 MG tablet Take 2 on the first day, then take 1 daily until completely gone (Patient not taking: Reported on 11/30/2023) 6 tablet 0   benzonatate  (TESSALON  PERLES) 100 MG capsule 1-2 capsules up to twice daily as needed for cough. (Patient not taking: Reported on 11/30/2023) 30 capsule 0   doxycycline   (VIBRA -TABS) 100 MG tablet Take 1 tablet (100 mg total) by mouth 2 (two) times daily. (Patient not taking: Reported on 11/30/2023) 14 tablet 0   potassium chloride  SA (KLOR-CON  M) 20 MEQ tablet Take 1 tablet (20 mEq total) by mouth daily. (Patient not taking: Reported on 11/30/2023) 10 tablet 0   tiZANidine  (ZANAFLEX ) 2 MG tablet Take 1 tablet (2 mg total) by mouth every 8 (eight) hours as needed for muscle spasms. (Patient not taking: Reported on 11/30/2023) 30  tablet 2   No facility-administered medications prior to visit.        Objective:   Physical Exam  Vitals:   11/30/23 1111  BP: 126/78  Pulse: 80  Temp: 97.9 F (36.6 C)  TempSrc: Oral  SpO2: 97%  Weight: 138 lb 3.2 oz (62.7 kg)  Height: 5' 3 (1.6 m)    Gen: Pleasant, well-nourished, in no distress, somewhat tearful  ENT: No lesions,  mouth clear,  oropharynx clear, no postnasal drip  Neck: No JVD, no stridor  Lungs: No use of accessory muscles, very distant, wheeze on a forced expiration  Cardiovascular: RRR, heart sounds normal, no murmur or gallops, no peripheral edema  Musculoskeletal: No deformities, no cyanosis or clubbing  Neuro: alert, awake, non focal  Skin: Warm, no lesions or rash      Assessment & Plan:   Adenocarcinoma of right lung, stage 4 (HCC) We reviewed your CT scan of the chest/abdomen/pelvis.  This is stable.  Good news.  Continue to follow with Dr. Sherrod as planned.  COPD (chronic obstructive pulmonary disease) (HCC) Please continue Breztri  2 puffs twice a day.  Rinse and gargle after using. Keep your albuterol  available use 2 puffs when needed for shortness of breath, chest tightness, wheezing. Please start azithromycin  250 mg once daily.  We will continue this medication indefinitely and discuss whether it is benefiting you next time. Flu shot today  Tobacco use Congratulations on decreasing your cigarettes.  Continue to work on stopping altogether.  History of pulmonary embolus  (PE) Continue Eliquis      Lamar Chris, MD, PhD 11/30/2023, 12:28 PM [facility] [phone] or if no answer before 7:00PM call [phone] For any issues after 7:00PM please call eLink [phone]

## 2023-11-30 NOTE — Patient Instructions (Signed)
 Please continue Breztri  2 puffs twice a day.  Rinse and gargle after using. Keep your albuterol  available use 2 puffs when needed for shortness of breath, chest tightness, wheezing. Please start azithromycin  250 mg once daily.  We will continue this medication indefinitely and discuss whether it is benefiting you next time. Flu shot today Congratulations on decreasing your cigarettes.  Continue to work on stopping altogether. We reviewed your CT scan of the chest/abdomen/pelvis.  This is stable.  Good news.  Continue to follow with Dr. Sherrod as planned. Follow in our office in 3 months.  Please call sooner if you have any problems.

## 2023-11-30 NOTE — Assessment & Plan Note (Signed)
-   Continue Eliquis

## 2023-11-30 NOTE — Assessment & Plan Note (Signed)
 Congratulations on decreasing your cigarettes.  Continue to work on stopping altogether.

## 2023-11-30 NOTE — Assessment & Plan Note (Signed)
 We reviewed your CT scan of the chest/abdomen/pelvis.  This is stable.  Good news.  Continue to follow with Dr. Sherrod as planned.

## 2023-11-30 NOTE — Assessment & Plan Note (Signed)
 Please continue Breztri  2 puffs twice a day.  Rinse and gargle after using. Keep your albuterol  available use 2 puffs when needed for shortness of breath, chest tightness, wheezing. Please start azithromycin  250 mg once daily.  We will continue this medication indefinitely and discuss whether it is benefiting you next time. Flu shot today

## 2023-12-06 ENCOUNTER — Telehealth: Payer: Self-pay | Admitting: Internal Medicine

## 2023-12-06 NOTE — Telephone Encounter (Signed)
 I spoke with patient regarding scheduling a port flush per staff message from [name], RN, but patient states she will have one on 12/19/2023 and doesn't have a port flush w/lab until 02/20/2024 which is scheduled.

## 2023-12-19 ENCOUNTER — Inpatient Hospital Stay: Attending: Oncology

## 2023-12-19 DIAGNOSIS — C3411 Malignant neoplasm of upper lobe, right bronchus or lung: Secondary | ICD-10-CM | POA: Insufficient documentation

## 2024-01-16 ENCOUNTER — Other Ambulatory Visit: Payer: Self-pay | Admitting: Physician Assistant

## 2024-01-16 DIAGNOSIS — C3491 Malignant neoplasm of unspecified part of right bronchus or lung: Secondary | ICD-10-CM

## 2024-01-29 ENCOUNTER — Encounter: Payer: Self-pay | Admitting: Internal Medicine

## 2024-01-29 ENCOUNTER — Telehealth: Payer: Self-pay

## 2024-01-29 NOTE — Telephone Encounter (Signed)
 Returned pts call regarding her appt for CT scan before her next appt before seeing Dr Sherrod.  Informed pt that central scheduling makes this appt and gave pt number to call.  Pt verbalized understanding.

## 2024-02-20 ENCOUNTER — Inpatient Hospital Stay: Attending: Oncology

## 2024-02-20 ENCOUNTER — Ambulatory Visit (HOSPITAL_COMMUNITY)
Admission: RE | Admit: 2024-02-20 | Discharge: 2024-02-20 | Disposition: A | Discharge status: Home / Self Care | Source: Ambulatory Visit | Attending: Internal Medicine | Admitting: Internal Medicine

## 2024-02-20 DIAGNOSIS — J439 Emphysema, unspecified: Secondary | ICD-10-CM | POA: Diagnosis not present

## 2024-02-20 DIAGNOSIS — R918 Other nonspecific abnormal finding of lung field: Secondary | ICD-10-CM | POA: Diagnosis not present

## 2024-02-20 DIAGNOSIS — I700 Atherosclerosis of aorta: Secondary | ICD-10-CM | POA: Insufficient documentation

## 2024-02-20 DIAGNOSIS — C3411 Malignant neoplasm of upper lobe, right bronchus or lung: Secondary | ICD-10-CM | POA: Diagnosis present

## 2024-02-20 DIAGNOSIS — C3490 Malignant neoplasm of unspecified part of unspecified bronchus or lung: Secondary | ICD-10-CM

## 2024-02-20 LAB — CBC WITH DIFFERENTIAL (CANCER CENTER ONLY)
Abs Immature Granulocytes: 0.03 K/uL (ref 0.00–0.07)
Basophils Absolute: 0 K/uL (ref 0.0–0.1)
Basophils Relative: 0 %
Eosinophils Absolute: 0.4 K/uL (ref 0.0–0.5)
Eosinophils Relative: 5 %
HCT: 37.5 % (ref 36.0–46.0)
Hemoglobin: 12.5 g/dL (ref 12.0–15.0)
Immature Granulocytes: 0 %
Lymphocytes Relative: 28 %
Lymphs Abs: 2.2 K/uL (ref 0.7–4.0)
MCH: 28.5 pg (ref 26.0–34.0)
MCHC: 33.3 g/dL (ref 30.0–36.0)
MCV: 85.6 fL (ref 80.0–100.0)
Monocytes Absolute: 0.7 K/uL (ref 0.1–1.0)
Monocytes Relative: 9 %
Neutro Abs: 4.6 K/uL (ref 1.7–7.7)
Neutrophils Relative %: 58 %
Platelet Count: 206 K/uL (ref 150–400)
RBC: 4.38 MIL/uL (ref 3.87–5.11)
RDW: 13.8 % (ref 11.5–15.5)
WBC Count: 8 K/uL (ref 4.0–10.5)
nRBC: 0 % (ref 0.0–0.2)

## 2024-02-20 LAB — CMP (CANCER CENTER ONLY)
ALT: 10 U/L (ref 0–44)
AST: 22 U/L (ref 15–41)
Albumin: 4.3 g/dL (ref 3.5–5.0)
Alkaline Phosphatase: 91 U/L (ref 38–126)
Anion gap: 11 (ref 5–15)
BUN: 21 mg/dL (ref 8–23)
CO2: 24 mmol/L (ref 22–32)
Calcium: 9.6 mg/dL (ref 8.9–10.3)
Chloride: 106 mmol/L (ref 98–111)
Creatinine: 1.04 mg/dL — ABNORMAL HIGH (ref 0.44–1.00)
GFR, Estimated: 58 mL/min — ABNORMAL LOW
Glucose, Bld: 99 mg/dL (ref 70–99)
Potassium: 4.2 mmol/L (ref 3.5–5.1)
Sodium: 141 mmol/L (ref 135–145)
Total Bilirubin: 0.4 mg/dL (ref 0.0–1.2)
Total Protein: 7.2 g/dL (ref 6.5–8.1)

## 2024-02-20 MED ORDER — HEPARIN SOD (PORK) LOCK FLUSH 100 UNIT/ML IV SOLN
500.0000 [IU] | Freq: Once | INTRAVENOUS | Status: AC
  Administered 2024-02-20: 500 [IU] via INTRAVENOUS

## 2024-02-20 MED ORDER — IOHEXOL 300 MG/ML  SOLN
100.0000 mL | Freq: Once | INTRAMUSCULAR | Status: AC | PRN
  Administered 2024-02-20: 100 mL via INTRAVENOUS

## 2024-02-20 MED ORDER — HEPARIN SOD (PORK) LOCK FLUSH 100 UNIT/ML IV SOLN
INTRAVENOUS | Status: AC
  Filled 2024-02-20: qty 5

## 2024-02-27 ENCOUNTER — Inpatient Hospital Stay: Attending: Oncology | Admitting: Internal Medicine

## 2024-02-27 VITALS — BP 145/78 | HR 86 | Temp 98.9°F | Resp 17 | Ht 63.0 in | Wt 145.0 lb

## 2024-02-27 DIAGNOSIS — C3411 Malignant neoplasm of upper lobe, right bronchus or lung: Secondary | ICD-10-CM | POA: Insufficient documentation

## 2024-02-27 DIAGNOSIS — R0609 Other forms of dyspnea: Secondary | ICD-10-CM | POA: Insufficient documentation

## 2024-02-27 DIAGNOSIS — C3490 Malignant neoplasm of unspecified part of unspecified bronchus or lung: Secondary | ICD-10-CM

## 2024-02-27 DIAGNOSIS — R635 Abnormal weight gain: Secondary | ICD-10-CM | POA: Diagnosis not present

## 2024-02-27 DIAGNOSIS — Z9221 Personal history of antineoplastic chemotherapy: Secondary | ICD-10-CM | POA: Insufficient documentation

## 2024-02-27 NOTE — Progress Notes (Signed)
 "     [facility] [phone]   [phone]  OFFICE PROGRESS NOTE  Merna Huxley, NP [address] [place] [address]  DIAGNOSIS:  Stage IV (T2a, N0, M1a) non-small cell lung cancer, adenocarcinoma presented with multifocal bilateral pulmonary nodules involving the right upper lobe as well as the smaller bilateral groundglass opacities diagnosed in April 2023.  PD-L1 expression is 4%.  Molecular studies by [employer] 360 tissue test showed positive KRAS G12C mutation but the blood test failed secondary to insufficient circulating tumor DNA.  PRIOR THERAPY:  1) Systemic chemotherapy with carboplatin  for AUC of 5, Alimta  500 Mg/M2 and Keytruda  200 Mg IV every 3 weeks.  First dose 08/10/2021.  Status post 8 cycles.  Starting from cycle #5 the patient is on maintenance treatment with Alimta  and Keytruda  every 3 weeks.  This treatment was discontinued secondary to disease progression  . 2) Krazati  (Adagrasib ) 600 mg p.o. twice daily.  First dose started 03/08/2022. Status post about 1 month of treatment. We reduced dose to 400 mg BID starting from today 04/05/22 due to intolerance. Treatment currently on hold starting from 04/15/22 due to intolerance.   CURRENT THERAPY:  Observation  INTERVAL HISTORY: Sue Chase 69 y.o. female returns to the clinic today for 71-month follow-up visit. Discussed the use of AI scribe software for clinical note transcription with the patient, who gave verbal consent to proceed.  History of Present Illness Sue Chase is a 69 year old female with stage 4 non-small cell lung adenocarcinoma who presents for disease restaging.  She was diagnosed with metastatic non-small cell lung adenocarcinoma in April 2023, characterized by KRAS G12C mutation and low PD-L1 expression. Initial treatment included palliative pemetrexed  and pembrolizumab , discontinued in February 2024 due to intolerance. She has since remained under  surveillance without active oncologic therapy.  She reports significant weight gain, increasing from a nadir of 93 pounds to 145 pounds, and denies use of corticosteroids. She is currently taking erythromycin daily for dyspnea, which has resulted in improvement in her breathing. Despite this, she continues to experience exertional dyspnea and attributes some respiratory symptoms to increased body weight.  MEDICAL HISTORY: Past Medical History:  Diagnosis Date   Colon polyps    Complication of anesthesia    tolerated propofol  but other meds made her emotional / cry/ difficulty breathing/ panic   Hyperlipidemia    NSCL ca 2023   Rheumatic fever    Seasonal allergies    UTI (urinary tract infection)     ALLERGIES:  is allergic to codeine.  MEDICATIONS:  Current Outpatient Medications  Medication Sig Dispense Refill   acetaminophen  (TYLENOL ) 500 MG tablet Take 500 mg by mouth every 6 (six) hours as needed for moderate pain.     albuterol  (VENTOLIN  HFA) 108 (90 Base) MCG/ACT inhaler Inhale 2 puffs into the lungs every 6 (six) hours as needed for wheezing or shortness of breath. 8 g 5   azithromycin  (ZITHROMAX ) 250 MG tablet Take 1 tablet (250 mg total) by mouth daily. 30 tablet 3   budesonide-glycopyrrolate-formoterol  (BREZTRI  AEROSPHERE) 160-9-4.8 MCG/ACT AERO inhaler Inhale 2 puffs into the lungs in the morning and at bedtime. 10.7 g 11   ELIQUIS  5 MG TABS tablet TAKE 1 TABLET BY MOUTH 2 TIMES A DAY 60 tablet 2   loratadine (CLARITIN) 5 MG chewable tablet Chew 5 mg by mouth daily.     Magnesium  400 MG TABS Take 400 mg by mouth daily.     OVER  THE COUNTER MEDICATION Take 10 mEq by mouth daily.     predniSONE  (DELTASONE ) 10 MG tablet Take 40mg  daily for 3 days, then 30mg  daily for 3 days, then 20mg  daily for 3 days, then 10mg  daily for 3 days, then stop (Patient not taking: Reported on 11/30/2023) 30 tablet 0   No current facility-administered medications for this visit.    SURGICAL  HISTORY:  Past Surgical History:  Procedure Laterality Date   BRONCHIAL BIOPSY  06/08/2021   Procedure: BRONCHIAL BIOPSIES;  Surgeon: Brenna Adine CROME, DO;  Location: [facility];  Service: Pulmonary;;   BRONCHIAL BRUSHINGS  06/08/2021   Procedure: BRONCHIAL BRUSHINGS;  Surgeon: Brenna Adine CROME, DO;  Location: [facility];  Service: Pulmonary;;   BRONCHIAL NEEDLE ASPIRATION BIOPSY  06/08/2021   Procedure: BRONCHIAL NEEDLE ASPIRATION BIOPSIES;  Surgeon: Brenna Adine CROME, DO;  Location: [facility];  Service: Pulmonary;;   COLONOSCOPY  2018   ERCP N/A 11/05/2021   Procedure: ENDOSCOPIC RETROGRADE CHOLANGIOPANCREATOGRAPHY (ERCP);  Surgeon: Abran Norleen SAILOR, MD;  Location: [facility];  Service: Gastroenterology;  Laterality: N/A;   IR IMAGING GUIDED PORT INSERTION  08/12/2021   REMOVAL OF STONES  11/05/2021   Procedure: REMOVAL OF STONES;  Surgeon: Abran Norleen SAILOR, MD;  Location: [facility];  Service: Gastroenterology;;   SALIVARY GLAND SURGERY     LATE 80S   SPHINCTEROTOMY  11/05/2021   Procedure: SPHINCTEROTOMY;  Surgeon: Abran Norleen SAILOR, MD;  Location: [facility];  Service: Gastroenterology;;   TUBAL LIGATION  1986   VIDEO BRONCHOSCOPY WITH RADIAL ENDOBRONCHIAL ULTRASOUND  06/08/2021   Procedure: VIDEO BRONCHOSCOPY WITH RADIAL ENDOBRONCHIAL ULTRASOUND;  Surgeon: Brenna Adine CROME, DO;  Location: [facility];  Service: Pulmonary;;    REVIEW OF SYSTEMS:  Constitutional: positive for fatigue Eyes: negative Ears, nose, mouth, throat, and face: negative Respiratory: positive for dyspnea on exertion Cardiovascular: negative Gastrointestinal: negative Genitourinary:negative Integument/breast: negative Hematologic/lymphatic: negative Musculoskeletal:negative Neurological: negative Behavioral/Psych: negative Endocrine: negative Allergic/Immunologic: negative   PHYSICAL EXAMINATION: General appearance: alert, cooperative, fatigued, and no distress Head: Normocephalic, without obvious  abnormality, atraumatic Neck: no adenopathy, no JVD, supple, symmetrical, trachea midline, and thyroid  not enlarged, symmetric, no tenderness/mass/nodules Lymph nodes: Cervical, supraclavicular, and axillary nodes normal. Resp: clear to auscultation bilaterally Back: symmetric, no curvature. ROM normal. No CVA tenderness. Cardio: regular rate and rhythm, S1, S2 normal, no murmur, click, rub or gallop GI: soft, non-tender; bowel sounds normal; no masses,  no organomegaly Extremities: extremities normal, atraumatic, no cyanosis or edema Neurologic: Alert and oriented X 3, normal strength and tone. Normal symmetric reflexes. Normal coordination and gait  ECOG PERFORMANCE STATUS: 1 - Symptomatic but completely ambulatory  Blood pressure (!) 145/73, pulse 86, temperature 98.9 F (37.2 C), temperature source Temporal, resp. rate 17, height 5' 3 (1.6 m), weight 145 lb (65.8 kg), SpO2 96%.  LABORATORY DATA: Lab Results  Component Value Date   WBC 8.0 02/20/2024   HGB 12.5 02/20/2024   HCT 37.5 02/20/2024   MCV 85.6 02/20/2024   PLT 206 02/20/2024      Chemistry      Component Value Date/Time   NA 141 02/20/2024 1130   K 4.2 02/20/2024 1130   CL 106 02/20/2024 1130   CO2 24 02/20/2024 1130   BUN 21 02/20/2024 1130   CREATININE 1.04 (H) 02/20/2024 1130      Component Value Date/Time   CALCIUM 9.6 02/20/2024 1130   ALKPHOS 91 02/20/2024 1130   AST 22 02/20/2024 1130   ALT 10 02/20/2024 1130  BILITOT 0.4 02/20/2024 1130       RADIOGRAPHIC STUDIES: CT CHEST ABDOMEN PELVIS W CONTRAST Result Date: 02/27/2024 CLINICAL DATA:  Non-small cell lung cancer (NSCLC), staging. * Tracking Code: BO * EXAM: CT CHEST, ABDOMEN, AND PELVIS WITH CONTRAST TECHNIQUE: Multidetector CT imaging of the chest, abdomen and pelvis was performed following the standard protocol during bolus administration of intravenous contrast. RADIATION DOSE REDUCTION: This exam was performed according to the departmental  dose-optimization program which includes automated exposure control, adjustment of the mA and/or kV according to patient size and/or use of iterative reconstruction technique. CONTRAST:  OMNIPAQUE  IOHEXOL  300 MG/ML  SOLN COMPARISON:  CT scan chest, abdomen and pelvis from 10/24/2023. FINDINGS: CT CHEST FINDINGS Cardiovascular: Normal cardiac size. No pericardial effusion. No aortic aneurysm. There are coronary artery calcifications, in keeping with coronary artery disease. There are also mild to moderate peripheral atherosclerotic vascular calcifications of thoracic aorta and its major branches. Mediastinum/Nodes: Visualized thyroid  gland appears grossly unremarkable. No solid / cystic mediastinal masses. The esophagus is nondistended precluding optimal assessment. There are few mildly prominent mediastinal lymph nodes, which do not meet the size criteria for lymphadenopathy and appear grossly similar to the prior study, favoring benign etiology. No axillary or hilar lymphadenopathy by size criteria. Lungs/Pleura: The central tracheo-bronchial tree is patent. Redemonstration of multiple focal lung opacities, described as follows: *There is a part solid nodule in the posterior segment of right upper lobe measuring 2.2 x 3.3 cm, abutting the right major fissure. There is smaller central/inferior solid component which measures approximately 6 x 9 mm on series 6, image 52), essentially similar to the prior study. *There is a stable 5 x 5 mm solid noncalcified nodule in the right lung lower lobe, anteroinferiorly, abutting the major fissure (series 6, image 109), unchanged. *There are multiple (more than 25) additional predominantly ground-glass nodules throughout bilateral lungs (marked with electronic arrow sign on series 6). These are essentially unchanged in the interim. No new or suspicious lung nodule. No mass or consolidation. No pleural effusion or pneumothorax. There are mild upper lobe predominant  centrilobular emphysematous changes. There are several prominent emphysematous blebs in the middle lobe, also unchanged. Musculoskeletal: A CT Port-a-Cath is seen in the right upper chest wall with the catheter terminating in the cavo-atrial junction region. Visualized soft tissues of the chest wall are otherwise grossly unremarkable. No suspicious osseous lesions. There are mild multilevel degenerative changes in the visualized spine. There is mild superior endplate deformity of T9 vertebral body, similar to the prior study. No significant retropulsion or spinal canal compromise. CT ABDOMEN PELVIS FINDINGS Hepatobiliary: The liver is normal in size. Non-cirrhotic configuration. No suspicious mass. These is mild diffuse hepatic steatosis. No intrahepatic or extrahepatic bile duct dilation. No calcified gallstones. Normal gallbladder wall thickness. No pericholecystic inflammatory changes. Pancreas: Unremarkable. No pancreatic ductal dilatation or surrounding inflammatory changes. Spleen: Within normal limits. No focal lesion. Adrenals/Urinary Tract: Adrenal glands are unremarkable. No suspicious renal mass. Focal scarring noted in the right kidney upper pole. No nephroureterolithiasis or obstructive uropathy. Urinary bladder is under distended, precluding optimal assessment. However, no large mass or stones identified. No perivesical fat stranding. Stomach/Bowel: Small diverticula noted arising from the 2nd part of duodenum (series 2, image 70). No disproportionate dilation of the small or large bowel loops. No evidence of abnormal bowel wall thickening or inflammatory changes. The appendix is unremarkable. There are multiple diverticula mainly in the sigmoid colon, without imaging signs of diverticulitis. Vascular/Lymphatic: No ascites or pneumoperitoneum. No  abdominal or pelvic lymphadenopathy, by size criteria. No aneurysmal dilation of the major abdominal arteries. There are mild peripheral atherosclerotic  vascular calcifications of the aorta and its major branches. Reproductive: The uterus is unremarkable. No large adnexal mass. Other: There is a tiny fat containing umbilical hernia. The soft tissues and abdominal wall are otherwise unremarkable. Musculoskeletal: No suspicious osseous lesions. There are mild multilevel degenerative changes in the visualized spine. IMPRESSION: 1. Essentially stable exam. Redemonstration of multiple lung nodules, as described above. No new or suspicious lung nodule. 2. No metastatic disease identified within the abdomen or pelvis. 3. Multiple other nonacute observations, as described above. Aortic Atherosclerosis (ICD10-I70.0) and Emphysema (ICD10-J43.9). Electronically Signed   By: Ree Molt M.D.   On: 02/27/2024 09:08    ASSESSMENT AND PLAN: This is a very pleasant 69 years old white female diagnosed with stage IV (T2 a, N0, M1 a) non-small cell lung cancer, adenocarcinoma presented with multifocal bilateral pulmonary nodules involving the right upper lobe as well as the smaller bilateral groundglass opacities diagnosed in April 2023 with positive KRAS G12C mutation and PD-L1 expression of 4%. She underwent systemic chemotherapy with carboplatin  for AUC of 5, pemetrexed  500 Mg/M2 and Keytruda  200 Mg IV every 3 weeks status post 8 cycles.  Starting from cycle #5 the patient is on maintenance treatment with Alimta  and Keytruda  every 3 weeks. The patient then started treatment with Krazati  (Adagrasib ) initially at 600 mg p.o. twice daily for 1 months then her dose was reduced to 400 mg p.o. twice daily but again this was discontinued secondary to intolerance and she has been on observation since that time.  She had repeat CT scan of the chest, abdomen and pelvis performed recently.  I personally independently reviewed the scan and discussed the result with the patient today.  Her scan showed no concerning findings for disease progression. Assessment and Plan Assessment &  Plan Stage 4 non-small cell lung adenocarcinoma with KRAS G12C mutation and PD-L1 expression Metastatic lung adenocarcinoma with KRAS G12C mutation and low PD-L1 expression. Disease remains stable on recent imaging with no evidence of progression. She remains clinically stable on observation since cessation of therapy. - Reviewed recent CT scans of chest, abdomen, and pelvis demonstrating stable disease. - Provided copy of imaging results to her. - Extended surveillance imaging interval to every six months. - Advised her to report new symptoms or concerns. - Coordinated ongoing monitoring with pulmonology.  Dyspnea Chronic dyspnea, improved since initiation of erythromycin, likely multifactorial due to underlying lung cancer and recent significant weight gain. Mild wheezing on exam, overall respiratory status stable. - Discussed ongoing erythromycin use for symptomatic relief as prescribed by pulmonology. - Reinforced follow-up with pulmonologist scheduled for Friday. - Advised her to report any worsening respiratory symptoms.  Abnormal weight gain Marked weight gain from 93 to 145 pounds. Increased weight may contribute to dyspnea. She is not on corticosteroids. - Discussed potential impact of weight gain on respiratory symptoms.  She was advised to call immediately if she has any other concerning symptoms in the interval.  The patient voices understanding of current disease status and treatment options and is in agreement with the current care plan. All questions were answered. The patient knows to call the clinic with any problems, questions or concerns. We can certainly see the patient much sooner if necessary.  The total time spent in the appointment was 30 minutes including review of chart and various tests results, discussions about plan of care and coordination of  care plan .   Disclaimer: This note was dictated with voice recognition software. Similar sounding words can  inadvertently be transcribed and may not be corrected upon review.        "

## 2024-02-28 ENCOUNTER — Encounter: Payer: Self-pay | Admitting: Internal Medicine

## 2024-03-01 ENCOUNTER — Encounter: Payer: Self-pay | Admitting: Primary Care

## 2024-03-01 ENCOUNTER — Ambulatory Visit: Admitting: Primary Care

## 2024-03-01 VITALS — BP 124/72 | HR 79 | Temp 97.5°F | Ht 63.0 in | Wt 145.6 lb

## 2024-03-01 DIAGNOSIS — J449 Chronic obstructive pulmonary disease, unspecified: Secondary | ICD-10-CM

## 2024-03-01 DIAGNOSIS — J439 Emphysema, unspecified: Secondary | ICD-10-CM

## 2024-03-01 DIAGNOSIS — F17210 Nicotine dependence, cigarettes, uncomplicated: Secondary | ICD-10-CM

## 2024-03-01 DIAGNOSIS — K120 Recurrent oral aphthae: Secondary | ICD-10-CM

## 2024-03-01 DIAGNOSIS — C3490 Malignant neoplasm of unspecified part of unspecified bronchus or lung: Secondary | ICD-10-CM

## 2024-03-01 DIAGNOSIS — Z79899 Other long term (current) drug therapy: Secondary | ICD-10-CM

## 2024-03-01 MED ORDER — AEROCHAMBER PLUS FLO-VU MISC: 0 refills | Status: AC

## 2024-03-01 NOTE — Patient Instructions (Addendum)
" ° °  VISIT SUMMARY: During your follow-up visit, we discussed the management of your COPD and lung cancer. You reported improvements in your breathing and overall symptoms. We also addressed a new oral ulcer and your weight gain.  YOUR PLAN: -CHRONIC OBSTRUCTIVE PULMONARY DISEASE WITH EMPHYSEMA: COPD with emphysema is a chronic lung condition that makes it hard to breathe. You will continue using the Breztri  inhaler and azithromycin , which have been helping with your symptoms. We discussed the importance of pulmonary rehabilitation to improve your exercise tolerance and overall conditioning. If pulmonary rehab does not help, we may consider using an Ohtuvayre  nebulizer. An EKG has been ordered to monitor your heart due to the azithromycin  use, and a spacer for your inhaler has been ordered to reduce the risk of oral thrush.  -STAGE 4 LUNG CANCER UNDER OBSERVATION: Stage 4 lung cancer is an advanced form of cancer that has spread within the lungs. Your recent CT scan showed stable disease with no new suspicious nodules or metastatic disease. You will continue to be monitored by your oncologist, Dr. Sherrod  -ORAL APHTHOUS ULCER: An oral aphthous ulcer, also known as a canker sore, is a small, painful sore inside the mouth. It is likely not related to your Breztri  use. You are advised to do salt water  rinses and use over-the-counter benzocaine for relief. Avoid spicy and acidic foods, and monitor the ulcer. If it does not resolve in 1-2 weeks, please report back. We may consider nystatin mouthwash if thrush is suspected.  INSTRUCTIONS: Please continue using your Breztri  inhaler and azithromycin  as prescribed. Attend the pulmonary rehabilitation sessions at [facility] to improve your conditioning and exercise tolerance. Follow up with an EKG to monitor your heart due to azithromycin  use. Use the spacer with your Breztri  inhaler to reduce the risk of oral thrush. Monitor your oral ulcer and report if it does  not resolve in 1-2 weeks. Continue to follow up with Dr. Deatrice for your lung cancer management.  Orders: Aerochamber to use with Breztri    Follow-up: 3-4 months with Dr. Byrum "

## 2024-03-01 NOTE — Progress Notes (Signed)
 "  @Patient  ID: Sue Chase, female    DOB: 16-Sep-1955, 69 y.o.   MRN: [id number]  Chief Complaint  Patient presents with   COPD    Pt states her breathing is better, still has issues but it is better. Pt states she is out of shape     Referring provider: Merna Huxley, NP  HPI: 69 year old female, current some day smoker. PMH significant for COPD, adenocarcinoma right lung, GERD, hx PE, hyperlipidemia, GERD. Patient of Dr. Shelah.   Previous [facility] pulmonary encounter:  ROV 11/30/2023 --follow-up visit for 69 year old woman with a history of active tobacco use, COPD, stage IV adenocarcinoma of the right lung, DVT/PE.  I saw her in August when she was experiencing an acute exacerbation.  Treated with prednisone  and azithromycin . She is on Breztri .  She is currently on observation and is being followed by Dr. Sherrod. She has some improvement in her breathing, is dealing with cough. She developed some URI sx about 2 weeks ago. She is smoking about 2-3 cig a week. Has clear mucous. She uses albuterol  about 2-4x a day.   CT chest/abdomen/pelvis 10/24/2023 reviewed by me, shows stable large heterogeneous masslike area of ground glass attenuation and interstitial thickening with bronchiectatic change in the right upper lobe approximately 3.2 x 3.0 cm with associated retraction of the major fissure.  Also innumerable scattered ground glass nodules bilaterally without significant progression.  No mediastinal adenopathy.  She is currently on observation and is being followed by Dr. Sherrod.  Adenocarcinoma of right lung, stage 4 (HCC) We reviewed your CT scan of the chest/abdomen/pelvis.  This is stable.  Good news.  Continue to follow with Dr. Sherrod as planned.   COPD (chronic obstructive pulmonary disease) (HCC) Please continue Breztri  2 puffs twice a day.  Rinse and gargle after using. Keep your albuterol  available use 2 puffs when needed for shortness of breath, chest tightness,  wheezing. Please start azithromycin  250 mg once daily.  We will continue this medication indefinitely and discuss whether it is benefiting you next time. Flu shot today  Tobacco use Congratulations on decreasing your cigarettes.  Continue to work on stopping altogether.   History of pulmonary embolus (PE) Continue Eliquis     03/01/2024- Interim hx  Discussed the use of AI scribe software for clinical note transcription with the patient, who gave verbal consent to proceed.  History of Present Illness Sue Chase is a 69 year old female with COPD and lung cancer who presents for a follow-up visit regarding her COPD management.  She is currently on daily azithromycin  to help decrease flare-ups of bronchitis and exacerbations of her COPD. She notes an improvement in her symptoms, with less trouble breathing and no difficulty walking down the hall during this visit, unlike previous visits. She uses a Breztri  inhaler, two puffs in the morning and two puffs in the evening. She had a previous episode of oral thrush attributed to Breztri , which was treated with medication. Recently, she developed a white spot in her mouth, described as a canker sore, and she is unsure if it is related to Breztri . She uses albuterol  as needed. She reports gaining weight from 93 pounds to 145 pounds after completing chemotherapy, which she attributes to being out of shape.  She has a history of lung cancer for which she underwent chemotherapy. She is under observation for stage four lung cancer and had a CT scan on February 20, 2024 which was stable without evidence of metastatic disease.  She follows up with Dr. Sherrod for her lung cancer management.  In the review of symptoms, she reports a cough rated as a two or three on a scale of five, minimal phlegm rated as a one or two, no chest tightness, significant breathlessness rated as a four or five when walking up hills or stairs, moderate limitation in activities at  home rated as a three, some lack of confidence leaving home rated as a two, and moderate energy levels rated as a three. She does not attribute her sleep disturbances to her lung condition.  She has a history of a blood clot and is on Eliquis  for anticoagulation.   Allergies[1]  Immunization History  Administered Date(s) Administered   Fluad Quad(high Dose 65+) 02/20/2022   INFLUENZA, HIGH DOSE SEASONAL PF 11/30/2023   Influenza, Quadrivalent, Recombinant, Inj, Pf 12/15/2017, 11/17/2018   Influenza,inj,Quad PF,6+ Mos 11/27/2019   PFIZER(Purple Top)SARS-COV-2 Vaccination 05/15/2019, 06/05/2019   PNEUMOCOCCAL CONJUGATE-20 02/20/2022   Pfizer Covid-19 Vaccine Bivalent Booster 73yrs & up 02/14/2024   Tdap 01/04/2019    Past Medical History:  Diagnosis Date   Colon polyps    Complication of anesthesia    tolerated propofol  but other meds made her emotional / cry/ difficulty breathing/ panic   Hyperlipidemia    NSCL ca 2023   Rheumatic fever    Seasonal allergies    UTI (urinary tract infection)     Tobacco History: Tobacco Use History[2] Ready to quit: Not Answered Counseling given: Not Answered Tobacco comments: Patient smokes 2 cigarettes a week.  Patient is currently around smokers.  Updated 05-29-2023 Trying to quit.  Smoked 1 last week.  11/30/2023 hfb RN   Outpatient Medications Prior to Visit  Medication Sig Dispense Refill   acetaminophen  (TYLENOL ) 500 MG tablet Take 500 mg by mouth every 6 (six) hours as needed for moderate pain.     albuterol  (VENTOLIN  HFA) 108 (90 Base) MCG/ACT inhaler Inhale 2 puffs into the lungs every 6 (six) hours as needed for wheezing or shortness of breath. 8 g 5   azithromycin  (ZITHROMAX ) 250 MG tablet Take 1 tablet (250 mg total) by mouth daily. 30 tablet 3   budesonide-glycopyrrolate-formoterol  (BREZTRI  AEROSPHERE) 160-9-4.8 MCG/ACT AERO inhaler Inhale 2 puffs into the lungs in the morning and at bedtime. 10.7 g 11   ELIQUIS  5 MG TABS  tablet TAKE 1 TABLET BY MOUTH 2 TIMES A DAY 60 tablet 2   loratadine (CLARITIN) 5 MG chewable tablet Chew 5 mg by mouth daily.     Magnesium  400 MG TABS Take 400 mg by mouth daily.     OVER THE COUNTER MEDICATION Take 10 mEq by mouth daily.     predniSONE  (DELTASONE ) 10 MG tablet Take 40mg  daily for 3 days, then 30mg  daily for 3 days, then 20mg  daily for 3 days, then 10mg  daily for 3 days, then stop (Patient not taking: Reported on 03/01/2024) 30 tablet 0   No facility-administered medications prior to visit.   Review of Systems  Review of Systems  Constitutional: Negative.   Respiratory: Negative.  Negative for cough and wheezing.        DOE   Physical Exam  BP 124/72   Pulse 79   Temp (!) 97.5 F (36.4 C)   Ht 5' 3 (1.6 m) Comment: PT STATED  Wt 145 lb 9.6 oz (66 kg)   SpO2 98% Comment: RA  BMI 25.79 kg/m  Physical Exam Constitutional:      General: She is not in  acute distress.    Appearance: Normal appearance.  HENT:     Head: Normocephalic and atraumatic.     Mouth/Throat:     Mouth: Mucous membranes are moist.     Pharynx: Oropharynx is clear.  Cardiovascular:     Rate and Rhythm: Normal rate and regular rhythm.  Pulmonary:     Effort: Pulmonary effort is normal.     Breath sounds: Normal breath sounds. No wheezing or rales.  Musculoskeletal:        General: Normal range of motion.  Skin:    General: Skin is warm and dry.  Neurological:     General: No focal deficit present.     Mental Status: She is alert and oriented to person, place, and time. Mental status is at baseline.  Psychiatric:        Mood and Affect: Mood normal.        Behavior: Behavior normal.        Thought Content: Thought content normal.        Judgment: Judgment normal.      Lab Results:  CBC    Component Value Date/Time   WBC 8.0 02/20/2024 1130   WBC 5.4 04/10/2022 1252   RBC 4.38 02/20/2024 1130   HGB 12.5 02/20/2024 1130   HCT 37.5 02/20/2024 1130   PLT 206 02/20/2024 1130    MCV 85.6 02/20/2024 1130   MCH 28.5 02/20/2024 1130   MCHC 33.3 02/20/2024 1130   RDW 13.8 02/20/2024 1130   LYMPHSABS 2.2 02/20/2024 1130   MONOABS 0.7 02/20/2024 1130   EOSABS 0.4 02/20/2024 1130   BASOSABS 0.0 02/20/2024 1130    BMET    Component Value Date/Time   NA 141 02/20/2024 1130   K 4.2 02/20/2024 1130   CL 106 02/20/2024 1130   CO2 24 02/20/2024 1130   GLUCOSE 99 02/20/2024 1130   BUN 21 02/20/2024 1130   CREATININE 1.04 (H) 02/20/2024 1130   CALCIUM 9.6 02/20/2024 1130   GFRNONAA 58 (L) 02/20/2024 1130   GFRAA >60 09/23/2019 1031    BNP    Component Value Date/Time   BNP 85.5 02/17/2022 1935    ProBNP    Component Value Date/Time   PROBNP 26.0 06/29/2017 1002    Imaging: CT CHEST ABDOMEN PELVIS W CONTRAST Result Date: 02/27/2024 CLINICAL DATA:  Non-small cell lung cancer (NSCLC), staging. * Tracking Code: BO * EXAM: CT CHEST, ABDOMEN, AND PELVIS WITH CONTRAST TECHNIQUE: Multidetector CT imaging of the chest, abdomen and pelvis was performed following the standard protocol during bolus administration of intravenous contrast. RADIATION DOSE REDUCTION: This exam was performed according to the departmental dose-optimization program which includes automated exposure control, adjustment of the mA and/or kV according to patient size and/or use of iterative reconstruction technique. CONTRAST:  OMNIPAQUE  IOHEXOL  300 MG/ML  SOLN COMPARISON:  CT scan chest, abdomen and pelvis from 10/24/2023. FINDINGS: CT CHEST FINDINGS Cardiovascular: Normal cardiac size. No pericardial effusion. No aortic aneurysm. There are coronary artery calcifications, in keeping with coronary artery disease. There are also mild to moderate peripheral atherosclerotic vascular calcifications of thoracic aorta and its major branches. Mediastinum/Nodes: Visualized thyroid  gland appears grossly unremarkable. No solid / cystic mediastinal masses. The esophagus is nondistended precluding optimal  assessment. There are few mildly prominent mediastinal lymph nodes, which do not meet the size criteria for lymphadenopathy and appear grossly similar to the prior study, favoring benign etiology. No axillary or hilar lymphadenopathy by size criteria. Lungs/Pleura: The central tracheo-bronchial tree is  patent. Redemonstration of multiple focal lung opacities, described as follows: *There is a part solid nodule in the posterior segment of right upper lobe measuring 2.2 x 3.3 cm, abutting the right major fissure. There is smaller central/inferior solid component which measures approximately 6 x 9 mm on series 6, image 52), essentially similar to the prior study. *There is a stable 5 x 5 mm solid noncalcified nodule in the right lung lower lobe, anteroinferiorly, abutting the major fissure (series 6, image 109), unchanged. *There are multiple (more than 25) additional predominantly ground-glass nodules throughout bilateral lungs (marked with electronic arrow sign on series 6). These are essentially unchanged in the interim. No new or suspicious lung nodule. No mass or consolidation. No pleural effusion or pneumothorax. There are mild upper lobe predominant centrilobular emphysematous changes. There are several prominent emphysematous blebs in the middle lobe, also unchanged. Musculoskeletal: A CT Port-a-Cath is seen in the right upper chest wall with the catheter terminating in the cavo-atrial junction region. Visualized soft tissues of the chest wall are otherwise grossly unremarkable. No suspicious osseous lesions. There are mild multilevel degenerative changes in the visualized spine. There is mild superior endplate deformity of T9 vertebral body, similar to the prior study. No significant retropulsion or spinal canal compromise. CT ABDOMEN PELVIS FINDINGS Hepatobiliary: The liver is normal in size. Non-cirrhotic configuration. No suspicious mass. These is mild diffuse hepatic steatosis. No intrahepatic or  extrahepatic bile duct dilation. No calcified gallstones. Normal gallbladder wall thickness. No pericholecystic inflammatory changes. Pancreas: Unremarkable. No pancreatic ductal dilatation or surrounding inflammatory changes. Spleen: Within normal limits. No focal lesion. Adrenals/Urinary Tract: Adrenal glands are unremarkable. No suspicious renal mass. Focal scarring noted in the right kidney upper pole. No nephroureterolithiasis or obstructive uropathy. Urinary bladder is under distended, precluding optimal assessment. However, no large mass or stones identified. No perivesical fat stranding. Stomach/Bowel: Small diverticula noted arising from the 2nd part of duodenum (series 2, image 70). No disproportionate dilation of the small or large bowel loops. No evidence of abnormal bowel wall thickening or inflammatory changes. The appendix is unremarkable. There are multiple diverticula mainly in the sigmoid colon, without imaging signs of diverticulitis. Vascular/Lymphatic: No ascites or pneumoperitoneum. No abdominal or pelvic lymphadenopathy, by size criteria. No aneurysmal dilation of the major abdominal arteries. There are mild peripheral atherosclerotic vascular calcifications of the aorta and its major branches. Reproductive: The uterus is unremarkable. No large adnexal mass. Other: There is a tiny fat containing umbilical hernia. The soft tissues and abdominal wall are otherwise unremarkable. Musculoskeletal: No suspicious osseous lesions. There are mild multilevel degenerative changes in the visualized spine. IMPRESSION: 1. Essentially stable exam. Redemonstration of multiple lung nodules, as described above. No new or suspicious lung nodule. 2. No metastatic disease identified within the abdomen or pelvis. 3. Multiple other nonacute observations, as described above. Aortic Atherosclerosis (ICD10-I70.0) and Emphysema (ICD10-J43.9). Electronically Signed   By: Ree Molt M.D.   On: 02/27/2024 09:08      Assessment & Plan:   1. Chronic obstructive pulmonary disease, unspecified COPD type (HCC) (Primary)   Assessment and Plan Assessment & Plan Chronic obstructive pulmonary disease with emphysema COPD with emphysema, currently managed with Breztri  and azithromycin . Azithromycin  has improved symptoms, reducing dyspnea and exacerbations. Pulmonary rehabilitation discussed as a potential intervention to improve conditioning and exercise tolerance. Weight management and conditioning emphasized as part of COPD management. Consideration of Ohtuvayre  nebulizer if pulmonary rehab does not improve symptoms. - Continue Breztri  inhaler, two puffs twice daily. - Continue  azithromycin  indefinitely, QTC interval <470  - Referred to pulmonary rehabilitation at [facility] for conditioning and exercise tolerance improvement. - Will consider Ohtuvayre  nebulizer if pulmonary rehab does not improve symptoms. - Ordered EKG to monitor QTc interval due to azithromycin  use. - Ordered spacer for Breztri  inhaler to reduce oral thrush risk.  Stage 4 lung cancer under observation Stage 4 lung cancer under observation. Recent CT scan on December 30th showed stable disease with multiple lung nodules, no new suspicious nodules, and no metastatic disease. Continues to be monitored by oncologist Dr. Sherrod - Continue observation and monitoring of lung cancer with oncologist.  Oral aphthous ulcer Presence of oral aphthous ulcer, likely not related to Breztri  use. Differential includes canker sore versus thrush, but appearance suggests canker sore. Previous thrush episode resolved with treatment. Current ulcer expected to resolve in 1-2 weeks. - Recommended salt water  rinses and over-the-counter benzocaine for symptomatic relief. - Advised to avoid spicy and acidic foods. - Instructed to monitor ulcer and report if it does not resolve in 1-2 weeks. - Will consider nystatin mouthwash if thrush is  suspected.   Almarie LELON Ferrari, NP 03/01/2024      [1]  Allergies Allergen Reactions   Codeine Itching  [2]  Social History Tobacco Use  Smoking Status Some Days   Current packs/day: 1.00   Average packs/day: 1 pack/day for 40.0 years (40.0 ttl pk-yrs)   Types: Cigarettes   Passive exposure: Current  Smokeless Tobacco Never  Tobacco Comments   Patient smokes 2 cigarettes a week.  Patient is currently around smokers.  Updated 05-29-2023   Trying to quit.  Smoked 1 last week.  11/30/2023 hfb RN   "

## 2024-03-06 ENCOUNTER — Telehealth (HOSPITAL_COMMUNITY): Payer: Self-pay

## 2024-03-06 ENCOUNTER — Encounter (HOSPITAL_COMMUNITY): Payer: Self-pay

## 2024-03-06 NOTE — Telephone Encounter (Signed)
Attempted to call patient in regards to Pulmonary Rehab - LM on VM   Sent letter 

## 2024-03-18 ENCOUNTER — Other Ambulatory Visit: Payer: Self-pay | Admitting: Emergency Medicine

## 2024-03-26 ENCOUNTER — Telehealth: Payer: Self-pay | Admitting: Internal Medicine

## 2024-03-26 NOTE — Telephone Encounter (Signed)
 Pt called to sched her appt and her port flushes she also wanted the port flush that would be on June to be near her ct because she did not want to be coming in here repeatedly

## 2024-04-02 ENCOUNTER — Inpatient Hospital Stay

## 2024-04-16 ENCOUNTER — Inpatient Hospital Stay: Attending: Oncology

## 2024-05-14 ENCOUNTER — Inpatient Hospital Stay

## 2024-06-11 ENCOUNTER — Inpatient Hospital Stay: Attending: Oncology

## 2024-06-25 ENCOUNTER — Inpatient Hospital Stay

## 2024-07-10 ENCOUNTER — Ambulatory Visit: Admitting: Emergency Medicine

## 2024-08-06 ENCOUNTER — Inpatient Hospital Stay

## 2024-08-19 ENCOUNTER — Inpatient Hospital Stay: Attending: Oncology

## 2024-08-27 ENCOUNTER — Inpatient Hospital Stay: Attending: Oncology | Admitting: Internal Medicine

## 2024-10-18 ENCOUNTER — Inpatient Hospital Stay
